# Patient Record
Sex: Male | Born: 1955 | Race: White | Hispanic: No | Smoking: Current every day smoker
Health system: Southern US, Community
[De-identification: ages and names within clinical notes are randomized; demographics above are authoritative.]

## PROBLEM LIST (undated history)

## (undated) DIAGNOSIS — R0989 Other specified symptoms and signs involving the circulatory and respiratory systems: Secondary | ICD-10-CM

## (undated) DIAGNOSIS — R197 Diarrhea, unspecified: Secondary | ICD-10-CM

## (undated) DIAGNOSIS — R5381 Other malaise: Secondary | ICD-10-CM

## (undated) DIAGNOSIS — J449 Chronic obstructive pulmonary disease, unspecified: Secondary | ICD-10-CM

## (undated) DIAGNOSIS — K5900 Constipation, unspecified: Secondary | ICD-10-CM

## (undated) DIAGNOSIS — F3289 Other specified depressive episodes: Secondary | ICD-10-CM

## (undated) DIAGNOSIS — R51 Headache: Secondary | ICD-10-CM

## (undated) DIAGNOSIS — R5383 Other fatigue: Secondary | ICD-10-CM

## (undated) DIAGNOSIS — F411 Generalized anxiety disorder: Secondary | ICD-10-CM

## (undated) DIAGNOSIS — R0609 Other forms of dyspnea: Secondary | ICD-10-CM

## (undated) DIAGNOSIS — F329 Major depressive disorder, single episode, unspecified: Secondary | ICD-10-CM

## (undated) DIAGNOSIS — J4489 Other specified chronic obstructive pulmonary disease: Secondary | ICD-10-CM

## (undated) DIAGNOSIS — M545 Low back pain, unspecified: Secondary | ICD-10-CM

## (undated) DIAGNOSIS — I214 Non-ST elevation (NSTEMI) myocardial infarction: Secondary | ICD-10-CM

## (undated) DIAGNOSIS — J9610 Chronic respiratory failure, unspecified whether with hypoxia or hypercapnia: Secondary | ICD-10-CM

## (undated) DIAGNOSIS — R059 Cough, unspecified: Secondary | ICD-10-CM

## (undated) DIAGNOSIS — R634 Abnormal weight loss: Secondary | ICD-10-CM

## (undated) DIAGNOSIS — R05 Cough: Secondary | ICD-10-CM

## (undated) DIAGNOSIS — J189 Pneumonia, unspecified organism: Secondary | ICD-10-CM

## (undated) DIAGNOSIS — E079 Disorder of thyroid, unspecified: Secondary | ICD-10-CM

## (undated) DIAGNOSIS — J9383 Other pneumothorax: Secondary | ICD-10-CM

## (undated) HISTORY — DX: Abnormal weight loss: R63.4

## (undated) HISTORY — DX: Other specified symptoms and signs involving the circulatory and respiratory systems: R06.09

## (undated) HISTORY — DX: Generalized anxiety disorder: F41.1

## (undated) HISTORY — DX: Other specified symptoms and signs involving the circulatory and respiratory systems: R09.89

## (undated) HISTORY — DX: Diarrhea, unspecified: R19.7

## (undated) HISTORY — DX: Other fatigue: R53.83

## (undated) HISTORY — PX: CHEST TUBE INSERTION: SHX231

## (undated) HISTORY — DX: Constipation, unspecified: K59.00

## (undated) HISTORY — DX: Cough, unspecified: R05.9

## (undated) HISTORY — DX: Chronic obstructive pulmonary disease, unspecified: J44.9

## (undated) HISTORY — DX: Low back pain: M54.5

## (undated) HISTORY — DX: Cough: R05

## (undated) HISTORY — DX: Other specified chronic obstructive pulmonary disease: J44.89

## (undated) HISTORY — DX: Major depressive disorder, single episode, unspecified: F32.9

## (undated) HISTORY — DX: Other forms of dyspnea: R06.09

## (undated) HISTORY — DX: Other malaise: R53.81

## (undated) HISTORY — DX: Low back pain, unspecified: M54.50

## (undated) HISTORY — DX: Other specified depressive episodes: F32.89

## (undated) HISTORY — DX: Headache: R51

---

## 1985-05-09 HISTORY — PX: SPLENECTOMY: SUR1306

## 1998-09-17 ENCOUNTER — Emergency Department (HOSPITAL_COMMUNITY): Admission: EM | Admit: 1998-09-17 | Discharge: 1998-09-17 | Payer: Self-pay | Admitting: Emergency Medicine

## 1998-09-17 ENCOUNTER — Encounter: Payer: Self-pay | Admitting: Emergency Medicine

## 2002-12-10 ENCOUNTER — Emergency Department (HOSPITAL_COMMUNITY): Admission: EM | Admit: 2002-12-10 | Discharge: 2002-12-10 | Payer: Self-pay | Admitting: Emergency Medicine

## 2003-03-30 ENCOUNTER — Emergency Department (HOSPITAL_COMMUNITY): Admission: EM | Admit: 2003-03-30 | Discharge: 2003-03-30 | Payer: Self-pay | Admitting: Emergency Medicine

## 2004-03-01 ENCOUNTER — Emergency Department (HOSPITAL_COMMUNITY): Admission: EM | Admit: 2004-03-01 | Discharge: 2004-03-01 | Payer: Self-pay | Admitting: Emergency Medicine

## 2009-03-17 ENCOUNTER — Emergency Department (HOSPITAL_COMMUNITY): Admission: EM | Admit: 2009-03-17 | Discharge: 2009-03-17 | Payer: Self-pay | Admitting: Emergency Medicine

## 2009-11-19 ENCOUNTER — Emergency Department (HOSPITAL_COMMUNITY): Admission: EM | Admit: 2009-11-19 | Discharge: 2009-11-19 | Payer: Self-pay | Admitting: Emergency Medicine

## 2009-12-01 ENCOUNTER — Ambulatory Visit: Payer: Self-pay | Admitting: Family Medicine

## 2009-12-08 ENCOUNTER — Emergency Department (HOSPITAL_COMMUNITY): Admission: EM | Admit: 2009-12-08 | Discharge: 2009-12-08 | Payer: Self-pay | Admitting: Emergency Medicine

## 2010-05-09 HISTORY — PX: OTHER SURGICAL HISTORY: SHX169

## 2010-06-14 ENCOUNTER — Emergency Department (HOSPITAL_COMMUNITY): Payer: Medicaid Other

## 2010-06-14 ENCOUNTER — Emergency Department (HOSPITAL_COMMUNITY)
Admission: EM | Admit: 2010-06-14 | Discharge: 2010-06-14 | Disposition: A | Discharge status: Home / Self Care | Payer: Medicaid Other | Attending: Emergency Medicine | Admitting: Emergency Medicine

## 2010-06-14 DIAGNOSIS — J449 Chronic obstructive pulmonary disease, unspecified: Secondary | ICD-10-CM | POA: Insufficient documentation

## 2010-06-14 DIAGNOSIS — R0602 Shortness of breath: Secondary | ICD-10-CM | POA: Insufficient documentation

## 2010-06-14 DIAGNOSIS — J4489 Other specified chronic obstructive pulmonary disease: Secondary | ICD-10-CM | POA: Insufficient documentation

## 2010-08-11 LAB — BASIC METABOLIC PANEL
CO2: 30 mEq/L (ref 19–32)
Calcium: 9 mg/dL (ref 8.4–10.5)
Creatinine, Ser: 0.99 mg/dL (ref 0.4–1.5)
GFR calc Af Amer: >60 mL/min (ref >60)
GFR calc non Af Amer: >60 mL/min (ref >60)
Sodium: 138 mEq/L (ref 135–145)

## 2010-08-11 LAB — DIFFERENTIAL
Lymphocytes Relative: 32 % (ref 12–46)
Lymphs Abs: 3.9 10*3/uL (ref 0.7–4.0)
Monocytes Absolute: 1.9 10*3/uL — ABNORMAL HIGH (ref 0.1–1.0)
Monocytes Relative: 16 % — ABNORMAL HIGH (ref 3–12)
Neutro Abs: 5.7 10*3/uL (ref 1.7–7.7)
Neutrophils Relative %: 47 % (ref 43–77)

## 2010-08-11 LAB — POCT CARDIAC MARKERS
CKMB, poc: <1 ng/mL — ABNORMAL LOW (ref 1.0–8.0)
Myoglobin, poc: 51.2 ng/mL (ref 12–200)
Troponin i, poc: <0.05 ng/mL (ref 0.00–0.09)

## 2010-08-11 LAB — CBC
Hemoglobin: 15.9 g/dL (ref 13.0–17.0)
RBC: 5.23 MIL/uL (ref 4.22–5.81)
WBC: 12.2 10*3/uL — ABNORMAL HIGH (ref 4.0–10.5)

## 2011-01-05 ENCOUNTER — Observation Stay (HOSPITAL_COMMUNITY)
Admission: EM | Admit: 2011-01-05 | Discharge: 2011-01-06 | Disposition: A | Discharge status: Home / Self Care | Payer: Medicare Other | Attending: Internal Medicine | Admitting: Internal Medicine

## 2011-01-05 ENCOUNTER — Emergency Department (HOSPITAL_COMMUNITY): Payer: Medicare Other

## 2011-01-05 ENCOUNTER — Encounter: Payer: Self-pay | Admitting: Internal Medicine

## 2011-01-05 DIAGNOSIS — R0602 Shortness of breath: Secondary | ICD-10-CM | POA: Insufficient documentation

## 2011-01-05 DIAGNOSIS — Z8571 Personal history of Hodgkin lymphoma: Secondary | ICD-10-CM | POA: Insufficient documentation

## 2011-01-05 DIAGNOSIS — F17200 Nicotine dependence, unspecified, uncomplicated: Secondary | ICD-10-CM | POA: Insufficient documentation

## 2011-01-05 DIAGNOSIS — I951 Orthostatic hypotension: Secondary | ICD-10-CM | POA: Insufficient documentation

## 2011-01-05 DIAGNOSIS — R072 Precordial pain: Secondary | ICD-10-CM

## 2011-01-05 DIAGNOSIS — Z23 Encounter for immunization: Secondary | ICD-10-CM | POA: Insufficient documentation

## 2011-01-05 DIAGNOSIS — J439 Emphysema, unspecified: Principal | ICD-10-CM | POA: Insufficient documentation

## 2011-01-05 DIAGNOSIS — R079 Chest pain, unspecified: Secondary | ICD-10-CM | POA: Insufficient documentation

## 2011-01-05 LAB — CBC
HCT: 46 % (ref 39.0–52.0)
MCHC: 35.4 g/dL (ref 30.0–36.0)
MCV: 88.5 fL (ref 78.0–100.0)
Platelets: 276 10*3/uL (ref 150–400)
RDW: 15.6 % — ABNORMAL HIGH (ref 11.5–15.5)
WBC: 9.4 10*3/uL (ref 4.0–10.5)

## 2011-01-05 LAB — URINALYSIS, ROUTINE W REFLEX MICROSCOPIC
Bilirubin Urine: NEGATIVE
Hgb urine dipstick: NEGATIVE
Ketones, ur: NEGATIVE mg/dL
Specific Gravity, Urine: 1.011 (ref 1.005–1.030)
pH: 6.5 (ref 5.0–8.0)

## 2011-01-05 LAB — DIFFERENTIAL
Basophils Absolute: 0.1 10*3/uL (ref 0.0–0.1)
Basophils Relative: 1 % (ref 0–1)
Eosinophils Absolute: 0.7 10*3/uL (ref 0.0–0.7)
Lymphocytes Relative: 38 % (ref 12–46)
Monocytes Absolute: 1.2 10*3/uL — ABNORMAL HIGH (ref 0.1–1.0)
Neutro Abs: 3.8 10*3/uL (ref 1.7–7.7)
Neutrophils Relative %: 41 % — ABNORMAL LOW (ref 43–77)

## 2011-01-05 LAB — POCT I-STAT, CHEM 8
Chloride: 100 mEq/L (ref 96–112)
Glucose, Bld: 94 mg/dL (ref 70–99)
HCT: 52 % (ref 39.0–52.0)
Hemoglobin: 17.7 g/dL — ABNORMAL HIGH (ref 13.0–17.0)
Potassium: 4.1 mEq/L (ref 3.5–5.1)

## 2011-01-05 LAB — CARDIAC PANEL(CRET KIN+CKTOT+MB+TROPI)
CK, MB: 2.6 ng/mL (ref 0.3–4.0)
Relative Index: INVALID (ref 0.0–2.5)
Troponin I: <0.3 ng/mL (ref <0.30)

## 2011-01-05 LAB — TSH: TSH: 1.233 u[IU]/mL (ref 0.350–4.500)

## 2011-01-05 LAB — POCT I-STAT TROPONIN I: Troponin i, poc: 0.01 ng/mL (ref 0.00–0.08)
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
## 2011-01-06 DIAGNOSIS — R079 Chest pain, unspecified: Secondary | ICD-10-CM

## 2011-01-06 DIAGNOSIS — R55 Syncope and collapse: Secondary | ICD-10-CM

## 2011-01-06 LAB — CBC
HCT: 44 % (ref 39.0–52.0)
MCH: 30.1 pg (ref 26.0–34.0)
MCHC: 34.1 g/dL (ref 30.0–36.0)
MCV: 88.4 fL (ref 78.0–100.0)
RDW: 15.4 % (ref 11.5–15.5)
WBC: 10.9 10*3/uL — ABNORMAL HIGH (ref 4.0–10.5)

## 2011-01-06 LAB — CARDIAC PANEL(CRET KIN+CKTOT+MB+TROPI)
CK, MB: 2.2 ng/mL (ref 0.3–4.0)
Total CK: 80 U/L (ref 7–232)
Troponin I: <0.3 ng/mL (ref <0.30)

## 2011-01-06 LAB — LIPID PANEL
HDL: 68 mg/dL (ref >39)
Total CHOL/HDL Ratio: 2.7 RATIO
VLDL: 25 mg/dL (ref 0–40)

## 2011-01-06 LAB — BASIC METABOLIC PANEL
BUN: 10 mg/dL (ref 6–23)
CO2: 27 mEq/L (ref 19–32)
Chloride: 105 mEq/L (ref 96–112)
GFR calc Af Amer: >60 mL/min (ref >60)
Potassium: 3.7 mEq/L (ref 3.5–5.1)
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
## 2011-04-18 ENCOUNTER — Emergency Department (HOSPITAL_COMMUNITY)
Admission: EM | Admit: 2011-04-18 | Discharge: 2011-04-18 | Discharge status: Against Medical Advice | Payer: Medicare Other

## 2011-04-18 NOTE — ED Notes (Signed)
Pt called in all waiting areas, unable to locate pt 

## 2011-07-31 ENCOUNTER — Emergency Department (HOSPITAL_COMMUNITY)
Admission: EM | Admit: 2011-07-31 | Discharge: 2011-07-31 | Disposition: A | Discharge status: Home / Self Care | Payer: Medicare Other | Attending: Emergency Medicine | Admitting: Emergency Medicine

## 2011-07-31 ENCOUNTER — Emergency Department (HOSPITAL_COMMUNITY): Payer: Medicare Other

## 2011-07-31 ENCOUNTER — Encounter (HOSPITAL_COMMUNITY): Payer: Self-pay | Admitting: General Practice

## 2011-07-31 DIAGNOSIS — E079 Disorder of thyroid, unspecified: Secondary | ICD-10-CM | POA: Insufficient documentation

## 2011-07-31 DIAGNOSIS — J4489 Other specified chronic obstructive pulmonary disease: Secondary | ICD-10-CM | POA: Insufficient documentation

## 2011-07-31 DIAGNOSIS — R233 Spontaneous ecchymoses: Secondary | ICD-10-CM | POA: Insufficient documentation

## 2011-07-31 DIAGNOSIS — F17200 Nicotine dependence, unspecified, uncomplicated: Secondary | ICD-10-CM | POA: Insufficient documentation

## 2011-07-31 DIAGNOSIS — M25473 Effusion, unspecified ankle: Secondary | ICD-10-CM | POA: Insufficient documentation

## 2011-07-31 DIAGNOSIS — M25579 Pain in unspecified ankle and joints of unspecified foot: Secondary | ICD-10-CM | POA: Insufficient documentation

## 2011-07-31 DIAGNOSIS — J449 Chronic obstructive pulmonary disease, unspecified: Secondary | ICD-10-CM | POA: Insufficient documentation

## 2011-07-31 DIAGNOSIS — M25476 Effusion, unspecified foot: Secondary | ICD-10-CM | POA: Insufficient documentation

## 2011-07-31 HISTORY — DX: Disorder of thyroid, unspecified: E07.9

## 2011-07-31 HISTORY — DX: Chronic obstructive pulmonary disease, unspecified: J44.9

## 2011-07-31 LAB — COMPREHENSIVE METABOLIC PANEL
ALT: 35 U/L (ref 0–53)
AST: 46 U/L — ABNORMAL HIGH (ref 0–37)
Albumin: 3.4 g/dL — ABNORMAL LOW (ref 3.5–5.2)
Alkaline Phosphatase: 113 U/L (ref 39–117)
BUN: 8 mg/dL (ref 6–23)
CO2: 27 mEq/L (ref 19–32)
Calcium: 9.3 mg/dL (ref 8.4–10.5)
Chloride: 100 mEq/L (ref 96–112)
Creatinine, Ser: 0.75 mg/dL (ref 0.50–1.35)
GFR calc Af Amer: >90 mL/min (ref >90)
GFR calc non Af Amer: >90 mL/min (ref >90)
Glucose, Bld: 83 mg/dL (ref 70–99)
Potassium: 4.1 mEq/L (ref 3.5–5.1)
Sodium: 138 mEq/L (ref 135–145)
Total Bilirubin: 0.3 mg/dL (ref 0.3–1.2)
Total Protein: 8.1 g/dL (ref 6.0–8.3)

## 2011-07-31 LAB — CBC
HCT: 45.7 % (ref 39.0–52.0)
Hemoglobin: 15.6 g/dL (ref 13.0–17.0)
MCH: 30.3 pg (ref 26.0–34.0)
MCHC: 34.1 g/dL (ref 30.0–36.0)
MCV: 88.7 fL (ref 78.0–100.0)
Platelets: 432 10*3/uL — ABNORMAL HIGH (ref 150–400)
RBC: 5.15 MIL/uL (ref 4.22–5.81)
RDW: 14.5 % (ref 11.5–15.5)
WBC: 9.6 10*3/uL (ref 4.0–10.5)

## 2011-07-31 LAB — DIFFERENTIAL
Basophils Absolute: 0.1 10*3/uL (ref 0.0–0.1)
Basophils Relative: 2 % — ABNORMAL HIGH (ref 0–1)
Eosinophils Absolute: 0.4 10*3/uL (ref 0.0–0.7)
Eosinophils Relative: 4 % (ref 0–5)
Lymphocytes Relative: 36 % (ref 12–46)
Lymphs Abs: 3.5 10*3/uL (ref 0.7–4.0)
Monocytes Absolute: 0.8 10*3/uL (ref 0.1–1.0)
Monocytes Relative: 8 % (ref 3–12)
Neutro Abs: 4.9 10*3/uL (ref 1.7–7.7)
Neutrophils Relative %: 50 % (ref 43–77)

## 2011-07-31 MED ORDER — HYDROCODONE-ACETAMINOPHEN 5-325 MG PO TABS: 1.0000 | ORAL_TABLET | ORAL | Status: AC | PRN

## 2011-07-31 NOTE — ED Notes (Signed)
Patient complaining of red, itchy rash on his ankle that is spreading around his foot and up his leg.  Patient states that he has had episodes of this rash before, but that it has cleared up on its own.  Patient rates pain 2/10 on the numerical pain scale; describes pain as a "soreness"; most pain located in medial aspect of inner right ankle.  Upon assessment, red splotchy rash noted on right ankle/foot.  Patient alert and oriented x4; PERRL present. Will continue to monitor.

## 2011-07-31 NOTE — ED Notes (Signed)
Pt states understanding of discharge instructions 

## 2011-07-31 NOTE — Discharge Instructions (Signed)
Please read over the instructions below. You was seen in the emergency department tonight for the rash on your bilateral lower extremities. This rash is called a petechial rash and can sometimes signal bleeding problems. However your labs tonight are normal. We have wrapped the swollen ankle in an Ace bandage. You can wear this until your discomfort improves. We have also prescribed a short course of medication for pain. Please take as directed. It is important that you get established with a primary care physician in this area for followup and for ongoing primary health care needs. We have provided the "[facility]" number above, the resource list below and the attached referral to assist you in getting established with a primary care physician. Return if symptoms worsen otherwise follow up as discussed.

## 2011-07-31 NOTE — ED Notes (Signed)
Pt with intermittent petechial rash over the last year - states it waxes and wanes and will completely go away at times.  He has been having increased swelling in the R ankle over the last 24 hours - has had rash present for several days - rash has some ttp.  Denies fevers, vomiting, ha, blurred vision, recent diarrheal illness and no recent antibiotic use.  PE:  Has soft abd, normal heart and lung sounds, no edema, mild swelling of the RL ankle, bilateral petechial rash to the LE's below the knee.  No rash in mouth, trunk, upper extremities.  Rash is non pruritic, non blanching and not raised.  Mild pain with ROM of the R ankle.  VS without fever or tachycardia.  Filed Vitals:   07/31/11 0052  BP: 110/63  Pulse: 80  Temp: 97.6 F (36.4 C)  Resp: 20   Pt has labs as below:  Results for orders placed during the hospital encounter of 07/31/11  CBC      Component Value Range   WBC 9.6  4.0 - 10.5 (K/uL)   RBC 5.15  4.22 - 5.81 (MIL/uL)   Hemoglobin 15.6  13.0 - 17.0 (g/dL)   HCT 16.1  09.6 - 04.5 (%)   MCV 88.7  78.0 - 100.0 (fL)   MCH 30.3  26.0 - 34.0 (pg)   MCHC 34.1  30.0 - 36.0 (g/dL)   RDW 40.9  81.1 - 91.4 (%)   Platelets 432 (*) 150 - 400 (K/uL)  DIFFERENTIAL      Component Value Range   Neutrophils Relative 50  43 - 77 (%)   Neutro Abs 4.9  1.7 - 7.7 (K/uL)   Lymphocytes Relative 36  12 - 46 (%)   Lymphs Abs 3.5  0.7 - 4.0 (K/uL)   Monocytes Relative 8  3 - 12 (%)   Monocytes Absolute 0.8  0.1 - 1.0 (K/uL)   Eosinophils Relative 4  0 - 5 (%)   Eosinophils Absolute 0.4  0.0 - 0.7 (K/uL)   Basophils Relative 2 (*) 0 - 1 (%)   Basophils Absolute 0.1  0.0 - 0.1 (K/uL)  COMPREHENSIVE METABOLIC PANEL      Component Value Range   Sodium 138  135 - 145 (mEq/L)   Potassium 4.1  3.5 - 5.1 (mEq/L)   Chloride 100  96 - 112 (mEq/L)   CO2 27  19 - 32 (mEq/L)   Glucose, Bld 83  70 - 99 (mg/dL)   BUN 8  6 - 23 (mg/dL)   Creatinine, Ser 7.82  0.50 - 1.35 (mg/dL)   Calcium 9.3  8.4  - 10.5 (mg/dL)   Total Protein 8.1  6.0 - 8.3 (g/dL)   Albumin 3.4 (*) 3.5 - 5.2 (g/dL)   AST 46 (*) 0 - 37 (U/L)   ALT 35  0 - 53 (U/L)   Alkaline Phosphatase 113  39 - 117 (U/L)   Total Bilirubin 0.3  0.3 - 1.2 (mg/dL)   GFR calc non Af Amer >90  >90 (mL/min)   GFR calc Af Amer >90  >90 (mL/min)   Dg Ankle Complete Right  07/31/2011  *RADIOLOGY REPORT*  Clinical Data: Rash  RIGHT ANKLE - COMPLETE 3+ VIEW  Comparison: None  Findings: Soft tissue swelling greatest medially and posteriorly. Ankle mortise intact. Osseous mineralization grossly normal for technique. No acute fracture, dislocation, or bone destruction.  IMPRESSION: No acute bony abnormalities.  Original Report Authenticated By: Lollie Marrow, M.D.  Normal Cr, normal platelets and blood counts. Referral given, does not appear to be related to a thrombocytopenia and pt does not appear septic, or ill appearing in any other way.  Pt has expressed understanding for need for follow up.  Medical screening examination/treatment/procedure(s) were conducted as a shared visit with non-physician practitioner(s) and myself.  I personally evaluated the patient during the encounter    Vida Roller, MD 07/31/11 [phone]

## 2011-07-31 NOTE — ED Provider Notes (Signed)
History     CSN: [id number]  Arrival date & time 07/31/11  4098   First MD Initiated Contact with Patient 07/31/11 [phone]      Chief Complaint  Patient presents with  . Rash    HPI: Patient is a 56 y.o. male presenting with rash. The history is provided by the patient.  Rash  This is a recurrent problem. The current episode started more than 1 week ago. The problem has been gradually worsening. The problem is associated with nothing. There has been no fever. The rash is present on the left lower leg and right lower leg. The pain is at a severity of 2/10. The pain is mild. The pain has been constant since onset. Pertinent negatives include no blisters, no itching and no weeping. He has tried nothing for the symptoms.  Pt reports chronic rash to BLE's x > 1 year. Pt denies rash associated w/ anything known. Rash began worsened approx 1 week ago. Presents tonight because he noted somewhat painful swelling and redness to (R) ankle which had never happened before. Denies injury.   Past Medical History  Diagnosis Date  . Thyroid disease   . COPD (chronic obstructive pulmonary disease)     Past Surgical History  Procedure Date  . Splenectomy     No family history on file.  History  Substance Use Topics  . Smoking status: Current Everyday Smoker -- 0.2 packs/day  . Smokeless tobacco: Not on file  . Alcohol Use: 3.6 oz/week    6 Cans of beer per week      Review of Systems  Constitutional: Negative.  Negative for fever.  HENT: Negative.  Negative for nosebleeds.   Eyes: Negative.   Respiratory: Negative.  Negative for shortness of breath.   Cardiovascular: Negative.  Negative for leg swelling.  Gastrointestinal: Negative.  Negative for blood in stool and anal bleeding.  Genitourinary: Negative.  Negative for hematuria.  Musculoskeletal: Negative.   Skin: Positive for rash. Negative for itching.  Neurological: Negative.  Negative for weakness.  Hematological: Negative.  Does  not bruise/bleed easily.  Psychiatric/Behavioral: Negative.     Allergies  Codeine  Home Medications   Current Outpatient Rx  Name Route Sig Dispense Refill  . ACETAMINOPHEN 325 MG PO TABS Oral Take 650 mg by mouth every 6 (six) hours as needed. Fever or pain    . ALBUTEROL SULFATE HFA 108 (90 BASE) MCG/ACT IN AERS Inhalation Inhale 2 puffs into the lungs every 6 (six) hours as needed. wheezing    . BUDESONIDE-FORMOTEROL FUMARATE 160-4.5 MCG/ACT IN AERO Inhalation Inhale 2 puffs into the lungs 2 (two) times daily.      BP 110/63  Pulse 80  Temp(Src) 97.6 F (36.4 C) (Oral)  Resp 20  SpO2 90%  Physical Exam  Constitutional: He appears well-developed and well-nourished.  HENT:  Head: Normocephalic and atraumatic.  Eyes: Conjunctivae are normal.  Neck: Neck supple.  Cardiovascular: Normal rate and regular rhythm.   Pulmonary/Chest: Effort normal and breath sounds normal.  Abdominal: Soft. Bowel sounds are normal.  Musculoskeletal: Normal range of motion.       Feet:       Swollen, ecchymotic area over entire(R)  medial malleolus. Area is TTP but pt has full ROM. Is able to flex and extend foot w/o difficulty. Bil dp's palpable  Neurological: He is alert.  Skin: Skin is warm and dry. Ecchymosis, petechiae and rash noted.       Petechial rash to BLE. Sparsely  distributed from lower thighs to mid-calf. More densely distributed from mid-calf to feet. Ecchymotic, swollen  area over medial (R) medial malleolus, otherwise no other significant, swelling or weeping.  Psychiatric: He has a normal mood and affect.    ED Course  Procedures   Findings and clinical impression discussed w/ pt. Will apple ace wrap to (R) ankle and plan for d/c home w/ Rx for short course of medication for pain and instructions to arrange f/u w/ PCP. Pt agreeable w/ plan. I have discussed pt w/ Dr Hyacinth Meeker who has also seen and examined pt and is agreeable w/ plan.  Labs Reviewed  CBC - Abnormal; Notable  for the following:    Platelets 432 (*)    All other components within normal limits  DIFFERENTIAL - Abnormal; Notable for the following:    Basophils Relative 2 (*)    All other components within normal limits  COMPREHENSIVE METABOLIC PANEL - Abnormal; Notable for the following:    Albumin 3.4 (*)    AST 46 (*)    All other components within normal limits   Dg Ankle Complete Right  07/31/2011  *RADIOLOGY REPORT*  Clinical Data: Rash  RIGHT ANKLE - COMPLETE 3+ VIEW  Comparison: None  Findings: Soft tissue swelling greatest medially and posteriorly. Ankle mortise intact. Osseous mineralization grossly normal for technique. No acute fracture, dislocation, or bone destruction.  IMPRESSION: No acute bony abnormalities.  Original Report Authenticated By: Lollie Marrow, M.D.     No diagnosis found.    MDM  HPI/PE and clinical findings c/w 1. Petechial rash to BLE's (pt w/o other c/o's such as weakness, h/a, fever, or other bleeding associated symptoms, plt ct 424, H/H 15.7 and 45.6 respectively 2. Swelling/ecchymosis (R) ankle (no injury, imaging w/o acute findings, full ROM, minimal pain, pulses intact)  Source of symptoms remains unclear,  HPI, labs and assessment suggest  no acute process. Pt to arrange f/u w/ PCP.        Leanne Chang, NP 08/01/11 1908

## 2011-07-31 NOTE — ED Notes (Signed)
Patient currently resting quietly in bed; no respiratory or acute distress.  Patient updated on plan of care; informed patient that FNP is currently over in pediatrics and will be back shortly.  Patient has no other questions or concerns at this time; will continue to monitor.  Patient moved from recliner in STRETCHER 4 to bed in STRETCHER 6 to make patient more comfortable.

## 2011-07-31 NOTE — ED Notes (Signed)
Pt to ED c/o medial R leg rash and tenderness to medial side of R ankle.

## 2011-08-02 NOTE — ED Provider Notes (Signed)
Medical screening examination/treatment/procedure(s) were conducted as a shared visit with non-physician practitioner(s) and myself.  I personally evaluated the patient during the encounter  Please see my separate respective documentation pertaining to this patient encounter   Vida Roller, MD 08/02/11 [phone]

## 2011-09-16 ENCOUNTER — Encounter (HOSPITAL_COMMUNITY): Payer: Self-pay

## 2011-09-16 ENCOUNTER — Emergency Department (HOSPITAL_COMMUNITY): Payer: Medicare Other

## 2011-09-16 ENCOUNTER — Inpatient Hospital Stay (HOSPITAL_COMMUNITY): Payer: Medicare Other

## 2011-09-16 ENCOUNTER — Inpatient Hospital Stay (HOSPITAL_COMMUNITY)
Admission: EM | Admit: 2011-09-16 | Discharge: 2011-09-26 | DRG: 164 | Disposition: A | Discharge status: Home / Self Care | Payer: Medicare Other | Attending: Pulmonary Disease | Admitting: Pulmonary Disease

## 2011-09-16 DIAGNOSIS — J4489 Other specified chronic obstructive pulmonary disease: Secondary | ICD-10-CM | POA: Diagnosis present

## 2011-09-16 DIAGNOSIS — J9383 Other pneumothorax: Principal | ICD-10-CM

## 2011-09-16 DIAGNOSIS — J939 Pneumothorax, unspecified: Secondary | ICD-10-CM

## 2011-09-16 DIAGNOSIS — J449 Chronic obstructive pulmonary disease, unspecified: Secondary | ICD-10-CM

## 2011-09-16 DIAGNOSIS — Z8571 Personal history of Hodgkin lymphoma: Secondary | ICD-10-CM

## 2011-09-16 DIAGNOSIS — J930 Spontaneous tension pneumothorax: Secondary | ICD-10-CM

## 2011-09-16 DIAGNOSIS — J9600 Acute respiratory failure, unspecified whether with hypoxia or hypercapnia: Secondary | ICD-10-CM

## 2011-09-16 DIAGNOSIS — Z79899 Other long term (current) drug therapy: Secondary | ICD-10-CM

## 2011-09-16 DIAGNOSIS — Z888 Allergy status to other drugs, medicaments and biological substances status: Secondary | ICD-10-CM

## 2011-09-16 DIAGNOSIS — F1010 Alcohol abuse, uncomplicated: Secondary | ICD-10-CM | POA: Diagnosis present

## 2011-09-16 DIAGNOSIS — F17200 Nicotine dependence, unspecified, uncomplicated: Secondary | ICD-10-CM | POA: Diagnosis present

## 2011-09-16 DIAGNOSIS — J9690 Respiratory failure, unspecified, unspecified whether with hypoxia or hypercapnia: Secondary | ICD-10-CM

## 2011-09-16 DIAGNOSIS — Z23 Encounter for immunization: Secondary | ICD-10-CM

## 2011-09-16 DIAGNOSIS — E871 Hypo-osmolality and hyponatremia: Secondary | ICD-10-CM | POA: Diagnosis present

## 2011-09-16 DIAGNOSIS — J439 Emphysema, unspecified: Secondary | ICD-10-CM | POA: Diagnosis present

## 2011-09-16 HISTORY — DX: Other pneumothorax: J93.83

## 2011-09-16 LAB — BASIC METABOLIC PANEL
BUN: 7 mg/dL (ref 6–23)
CO2: 24 mEq/L (ref 19–32)
Chloride: 101 mEq/L (ref 96–112)
Creatinine, Ser: 0.73 mg/dL (ref 0.50–1.35)
Glucose, Bld: 136 mg/dL — ABNORMAL HIGH (ref 70–99)

## 2011-09-16 LAB — POCT I-STAT 3, ART BLOOD GAS (G3+)
Acid-base deficit: 3 mmol/L — ABNORMAL HIGH (ref 0.0–2.0)
O2 Saturation: 99 %
Patient temperature: 98.6

## 2011-09-16 LAB — DIFFERENTIAL
Basophils Relative: 1 % (ref 0–1)
Eosinophils Absolute: 0.7 10*3/uL (ref 0.0–0.7)
Monocytes Absolute: 1.2 10*3/uL — ABNORMAL HIGH (ref 0.1–1.0)
Neutro Abs: 8.2 10*3/uL — ABNORMAL HIGH (ref 1.7–7.7)
Neutrophils Relative %: 50 % (ref 43–77)

## 2011-09-16 LAB — CBC
Hemoglobin: 17.5 g/dL — ABNORMAL HIGH (ref 13.0–17.0)
MCH: 29.8 pg (ref 26.0–34.0)
MCHC: 33.7 g/dL (ref 30.0–36.0)

## 2011-09-16 MED ORDER — METHYLPREDNISOLONE SODIUM SUCC 125 MG IJ SOLR
INTRAMUSCULAR | Status: AC
  Filled 2011-09-16: qty 2

## 2011-09-16 MED ORDER — ALBUTEROL SULFATE (5 MG/ML) 0.5% IN NEBU
2.5000 mg | INHALATION_SOLUTION | Freq: Four times a day (QID) | RESPIRATORY_TRACT | Status: DC
  Administered 2011-09-17 – 2011-09-21 (×19): 2.5 mg via RESPIRATORY_TRACT
  Filled 2011-09-16 (×20): qty 0.5

## 2011-09-16 MED ORDER — VITAMIN B-1 100 MG PO TABS
100.0000 mg | ORAL_TABLET | Freq: Every day | ORAL | Status: DC
  Administered 2011-09-17 – 2011-09-26 (×9): 100 mg via ORAL
  Filled 2011-09-16 (×10): qty 1

## 2011-09-16 MED ORDER — MORPHINE SULFATE 2 MG/ML IJ SOLN
INTRAMUSCULAR | Status: AC
  Filled 2011-09-16: qty 1

## 2011-09-16 MED ORDER — MORPHINE SULFATE 4 MG/ML IJ SOLN
3.0000 mg | INTRAMUSCULAR | Status: DC | PRN
  Administered 2011-09-17 – 2011-09-19 (×13): 3 mg via INTRAVENOUS
  Administered 2011-09-19: 4 mg via INTRAVENOUS
  Administered 2011-09-19 – 2011-09-22 (×15): 3 mg via INTRAVENOUS
  Filled 2011-09-16 (×31): qty 1

## 2011-09-16 MED ORDER — ALBUTEROL SULFATE (5 MG/ML) 0.5% IN NEBU
INHALATION_SOLUTION | RESPIRATORY_TRACT | Status: AC
  Filled 2011-09-16: qty 2

## 2011-09-16 MED ORDER — MAGNESIUM SULFATE 40 MG/ML IJ SOLN
2.0000 g | Freq: Once | INTRAMUSCULAR | Status: AC
  Administered 2011-09-16: 2 g via INTRAVENOUS
  Filled 2011-09-16 (×2): qty 50

## 2011-09-16 MED ORDER — ASPIRIN 81 MG PO CHEW
324.0000 mg | CHEWABLE_TABLET | ORAL | Status: AC
  Administered 2011-09-16: 324 mg via ORAL
  Filled 2011-09-16: qty 4

## 2011-09-16 MED ORDER — SODIUM CHLORIDE 0.9 % IV SOLN: 250.0000 mL | INTRAVENOUS | Status: DC | PRN

## 2011-09-16 MED ORDER — ALBUTEROL SULFATE (5 MG/ML) 0.5% IN NEBU: 2.5000 mg | INHALATION_SOLUTION | Freq: Once | RESPIRATORY_TRACT | Status: DC

## 2011-09-16 MED ORDER — MIDAZOLAM HCL 2 MG/2ML IJ SOLN
INTRAMUSCULAR | Status: AC
  Filled 2011-09-16: qty 4

## 2011-09-16 MED ORDER — LORAZEPAM 1 MG PO TABS
0.0000 mg | ORAL_TABLET | Freq: Four times a day (QID) | ORAL | Status: AC
  Administered 2011-09-16 – 2011-09-17 (×2): 1 mg via ORAL
  Administered 2011-09-17: 3 mg via ORAL
  Filled 2011-09-16: qty 3
  Filled 2011-09-16: qty 2
  Filled 2011-09-16: qty 1
  Filled 2011-09-16: qty 3

## 2011-09-16 MED ORDER — IPRATROPIUM BROMIDE 0.02 % IN SOLN
RESPIRATORY_TRACT | Status: AC
  Administered 2011-09-17: 0.5 mg via RESPIRATORY_TRACT
  Filled 2011-09-16: qty 2.5

## 2011-09-16 MED ORDER — IPRATROPIUM BROMIDE 0.02 % IN SOLN
0.5000 mg | Freq: Four times a day (QID) | RESPIRATORY_TRACT | Status: DC
  Administered 2011-09-17 – 2011-09-21 (×19): 0.5 mg via RESPIRATORY_TRACT
  Filled 2011-09-16 (×20): qty 2.5

## 2011-09-16 MED ORDER — MIDAZOLAM HCL 2 MG/2ML IJ SOLN
2.0000 mg | Freq: Once | INTRAMUSCULAR | Status: AC
  Administered 2011-09-16: 2 mg via INTRAVENOUS

## 2011-09-16 MED ORDER — LORAZEPAM 1 MG PO TABS
1.0000 mg | ORAL_TABLET | Freq: Four times a day (QID) | ORAL | Status: AC | PRN
  Administered 2011-09-16 – 2011-09-19 (×3): 1 mg via ORAL
  Filled 2011-09-16 (×3): qty 1

## 2011-09-16 MED ORDER — IPRATROPIUM BROMIDE 0.02 % IN SOLN: 0.5000 mg | Freq: Once | RESPIRATORY_TRACT | Status: DC

## 2011-09-16 MED ORDER — ALBUTEROL (5 MG/ML) CONTINUOUS INHALATION SOLN: 2.5000 mg/h | INHALATION_SOLUTION | RESPIRATORY_TRACT | Status: DC | PRN

## 2011-09-16 MED ORDER — BUDESONIDE-FORMOTEROL FUMARATE 160-4.5 MCG/ACT IN AERO
2.0000 | INHALATION_SPRAY | Freq: Two times a day (BID) | RESPIRATORY_TRACT | Status: DC
  Administered 2011-09-17 – 2011-09-26 (×18): 2 via RESPIRATORY_TRACT
  Filled 2011-09-16 (×2): qty 6

## 2011-09-16 MED ORDER — MOXIFLOXACIN HCL IN NACL 400 MG/250ML IV SOLN
400.0000 mg | Freq: Once | INTRAVENOUS | Status: AC
  Administered 2011-09-16: 400 mg via INTRAVENOUS
  Filled 2011-09-16: qty 250

## 2011-09-16 MED ORDER — ADULT MULTIVITAMIN W/MINERALS CH
1.0000 | ORAL_TABLET | Freq: Every day | ORAL | Status: DC
  Administered 2011-09-16 – 2011-09-21 (×6): 1 via ORAL
  Filled 2011-09-16 (×7): qty 1

## 2011-09-16 MED ORDER — MIDAZOLAM HCL 2 MG/2ML IJ SOLN
2.0000 mg | Freq: Once | INTRAMUSCULAR | Status: AC
  Administered 2011-09-16: 2 mg via INTRAVENOUS
  Filled 2011-09-16: qty 2

## 2011-09-16 MED ORDER — LORAZEPAM 1 MG PO TABS
0.0000 mg | ORAL_TABLET | Freq: Two times a day (BID) | ORAL | Status: DC
  Administered 2011-09-17: 2 mg via ORAL
  Administered 2011-09-18: 3 mg via ORAL
  Filled 2011-09-16: qty 1

## 2011-09-16 MED ORDER — FOLIC ACID 1 MG PO TABS
1.0000 mg | ORAL_TABLET | Freq: Every day | ORAL | Status: DC
  Administered 2011-09-17 – 2011-09-26 (×9): 1 mg via ORAL
  Filled 2011-09-16 (×10): qty 1

## 2011-09-16 MED ORDER — THIAMINE HCL 100 MG/ML IJ SOLN
100.0000 mg | Freq: Every day | INTRAMUSCULAR | Status: DC
  Filled 2011-09-16 (×10): qty 1

## 2011-09-16 MED ORDER — MORPHINE SULFATE 2 MG/ML IJ SOLN
2.0000 mg | Freq: Once | INTRAMUSCULAR | Status: AC
  Administered 2011-09-16: 2 mg via INTRAVENOUS
  Filled 2011-09-16: qty 1

## 2011-09-16 MED ORDER — ASPIRIN 300 MG RE SUPP: 300.0000 mg | RECTAL | Status: AC

## 2011-09-16 MED ORDER — PANTOPRAZOLE SODIUM 20 MG PO TBEC
20.0000 mg | DELAYED_RELEASE_TABLET | Freq: Every day | ORAL | Status: DC
  Administered 2011-09-16 – 2011-09-21 (×6): 20 mg via ORAL
  Filled 2011-09-16 (×7): qty 1

## 2011-09-16 MED ORDER — MORPHINE SULFATE 2 MG/ML IJ SOLN
2.0000 mg | INTRAMUSCULAR | Status: DC | PRN
  Administered 2011-09-16 (×3): 2 mg via INTRAVENOUS
  Filled 2011-09-16 (×2): qty 1

## 2011-09-16 MED ORDER — LORAZEPAM 2 MG/ML IJ SOLN: 1.0000 mg | Freq: Four times a day (QID) | INTRAMUSCULAR | Status: AC | PRN

## 2011-09-16 NOTE — Consult Note (Signed)
CARDIOTHORACIC SURGERY CONSULTATION REPORT  PCP is Franchot Mimes, MD, MD Referring Provider is Glynn Octave, MD   Reason for consultation:  Spontaneous pneumothorax   HPI:  The patient is a 56 yo male with known history of COPD, ongoing tobacco abuse, and previous history of spontaneous pneumothorax requiring chest tube placement on both the left and right sides in the past who presents with sudden onset of increased shortness of breath and pain across his shoulders since 1pm today.  CXR reveals large left pneumothorax.  Pulmonary Medicine and Cardiothoracic Surgery were both consulted.   Past Medical History  Diagnosis Date  . Thyroid disease   . COPD (chronic obstructive pulmonary disease)   . Hodgkin's disease   . Spontaneous pneumothorax     left  . Spontaneous pneumothorax     right    Past Surgical History  Procedure Date  . Splenectomy   . Chest tube insertion     left  . Chest tube insertion     right    History reviewed. No pertinent family history.  Social History History  Substance Use Topics  . Smoking status: Current Everyday Smoker -- 0.2 packs/day  . Smokeless tobacco: Not on file  . Alcohol Use: 3.6 oz/week    6 Cans of beer per week    Prior to Admission medications   Medication Sig Start Date End Date Taking? Authorizing Provider  albuterol (PROVENTIL HFA;VENTOLIN HFA) 108 (90 BASE) MCG/ACT inhaler Inhale 2 puffs into the lungs every 6 (six) hours as needed. wheezing   Yes Historical Provider, MD  budesonide-formoterol (SYMBICORT) 160-4.5 MCG/ACT inhaler Inhale 2 puffs into the lungs 2 (two) times daily.   Yes Historical Provider, MD    Current Facility-Administered Medications  Medication Dose Route Frequency Provider Last Rate Last Dose  . albuterol (PROVENTIL) (5 MG/ML) 0.5% nebulizer solution 2.5 mg  2.5 mg Nebulization Once Glynn Octave, MD      . albuterol (PROVENTIL) (5 MG/ML) 0.5% nebulizer solution           .  ipratropium (ATROVENT) 0.02 % nebulizer solution           . ipratropium (ATROVENT) nebulizer solution 0.5 mg  0.5 mg Nebulization Once Glynn Octave, MD      . magnesium sulfate IVPB 2 g 50 mL  2 g Intravenous Once Glynn Octave, MD      . methylPREDNISolone sodium succinate (SOLU-MEDROL) 125 mg/2 mL injection           . midazolam (VERSED) injection 2 mg  2 mg Intravenous Once Purcell Nails, MD   2 mg at 09/16/11 1543  . midazolam (VERSED) injection 2 mg  2 mg Intravenous Once Purcell Nails, MD   2 mg at 09/16/11 1555  . morphine 2 MG/ML injection 2 mg  2 mg Intravenous Once Purcell Nails, MD   2 mg at 09/16/11 1543  . moxifloxacin (AVELOX) IVPB 400 mg  400 mg Intravenous Once Glynn Octave, MD       Current Outpatient Prescriptions  Medication Sig Dispense Refill  . albuterol (PROVENTIL HFA;VENTOLIN HFA) 108 (90 BASE) MCG/ACT inhaler Inhale 2 puffs into the lungs every 6 (six) hours as needed. wheezing      . budesonide-formoterol (SYMBICORT) 160-4.5 MCG/ACT inhaler Inhale 2 puffs into the lungs 2 (two) times daily.        Allergies  Allergen Reactions  . Codeine Hives and Nausea And Vomiting  Review of Systems:  General:  normal appetite, normal energy   Respiratory:  + cough, no wheezing, no hemoptysis, + in with inspiration or cough, + chronic shortness of breath which is acutely worsened  Cardiac:   no chest pain or tightness, +exertional SOB, no resting SOB prior to today, no orthopnea, no LE edema, no palpitations, no syncope  GI:   no difficulty swallowing, no hematochezia, no hematemesis, no melena, no constipation, no diarrhea   GU:   no dysuria, no urgency, no frequency   Musculoskeletal: no arthritis, no arthralgia   Vascular:  no pain suggestive of claudication   Neuro:   no symptoms suggestive of TIA's, no seizures, no headaches, no peripheral neuropathy   Endocrine:  Negative   HEENT:  no loose teeth or painful teeth,  no recent vision  changes  Psych:   no anxiety, no depression    Physical Exam:   BP 139/88  Pulse 108  Temp(Src) 97.9 F (36.6 C) (Oral)  Resp 28  SpO2 97%  General:  Thin, chronically ill-appearing  HEENT:  Unremarkable   Neck:   no JVD, no bruits, no adenopathy   Chest:   Markedly diminished breath sounds on left side, no wheezes, no rhonchi   CV:   RRR, no murmur   Abdomen:  soft, non-tender, no masses   Extremities:  warm, well-perfused, pulses not palpable  Rectal/GU  Deferred  Neuro:   Grossly non-focal and symmetrical throughout  Skin:   Clean and dry, no rashes, no breakdown  Diagnostic Tests:   *RADIOLOGY REPORT*  Clinical Data: Shortness of breath, respiratory failure  PORTABLE CHEST - 1 VIEW  Comparison: Portable exam 1447 hours compared to 01/05/2011  Findings:  Normal heart size, mediastinal contours, and pulmonary vascularity.  Moderate to large left pneumothorax with partial atelectasis of the  left lower lobe.  No mediastinal shift.  Underlying emphysematous changes.  Infiltrate versus atelectasis in the right lower lobe.  Right apex scarring stable. Old left rib fractures.  IMPRESSION:  Moderate-to-large pneumothorax without mediastinal shift.  Partial atelectasis bilateral lower lobes.  Underlying COPD and right apex scarring.  Critical Value/emergent results were called by telephone at the  time of interpretation on 09/16/2011 at 1517 hours to Dr.  Manus Gunning, who verbally acknowledged these results.  Original Report Authenticated By: Lollie Marrow, M.D.   Impression:  Large recurrent left spontaneous pneumothorax in the setting of likely severe COPD and ongoing tobacco abuse.  The patient is acutely in distress and needs chest tube placement.    Plan:  Chest tube placement, then consider VATS for bleb resection depending upon progress.  COPD and other medical problems to be managed by Pulmonary Medicine team as requested by ED staff.    Salvatore Decent. [name],  MD 09/16/2011 4:11 PM

## 2011-09-16 NOTE — H&P (Signed)
Name: Philip Coleman MRN: [id number] DOB: 01/06/1956    LOS: 0  [place] [facility] Critical [facility] Note   History of Present Illness:  56 y/o WM with PMH of Hodgkin's Disease, Thyroid Disease, Severe COPD, h/o Recurrent spontaneous Pnuemothorax adm 5/10 with lt spon pnthx requiring chest tube. Transiently placed on bipap in ER. Chest tube placed by TCTS [name]) Followed by pulm at [facility] / Drains: Lt chest tube 5/10 >>   Tests / Events:    Past Medical History  Diagnosis Date  . Thyroid disease   . COPD (chronic obstructive pulmonary disease)   . Hodgkin's disease   . Spontaneous pneumothorax     left  . Spontaneous pneumothorax     right    Past Surgical History  Procedure Date  . Splenectomy   . Chest tube insertion     left  . Chest tube insertion     right    Prior to Admission medications   Medication Sig Start Date End Date Taking? Authorizing Provider  albuterol (PROVENTIL HFA;VENTOLIN HFA) 108 (90 BASE) MCG/ACT inhaler Inhale 2 puffs into the lungs every 6 (six) hours as needed. wheezing   Yes Historical Provider, MD  budesonide-formoterol (SYMBICORT) 160-4.5 MCG/ACT inhaler Inhale 2 puffs into the lungs 2 (two) times daily.   Yes Historical Provider, MD    Allergies Allergies  Allergen Reactions  . Codeine Hives and Nausea And Vomiting    Family History History reviewed. No pertinent family history.  Social History  reports that he has been smoking.  He does not have any smokeless tobacco history on file. He reports that he drinks about 3.6 ounces of alcohol per week. He reports that he does not use illicit drugs.  Review Of Systems:  Constitutional: negative for anorexia, fevers and sweats  Eyes: negative for irritation, redness and visual disturbance  Ears, nose, mouth, throat, and face: negative for earaches, epistaxis, nasal congestion and sore throat  Respiratory: negative for cough, dyspnea on exertion, sputum and wheezing , c/o lt  chest pain Cardiovascular: negative dyspnea, lower extremity edema, orthopnea, palpitations and syncope  Gastrointestinal: negative for abdominal pain, constipation, diarrhea, melena, nausea and vomiting  Genitourinary:negative for dysuria, frequency and hematuria  Hematologic/lymphatic: negative for bleeding, easy bruising and lymphadenopathy  Musculoskeletal:negative for arthralgias, muscle weakness and stiff joints  Neurological: negative for coordination problems, gait problems, headaches and weakness  Endocrine: negative for diabetic symptoms including polydipsia, polyuria and weight loss   Vital Signs: Temp:  [97.9 F (36.6 C)] 97.9 F (36.6 C) (05/10 1436) Pulse Rate:  [108-126] 108  (05/10 1541) Resp:  [17-28] 28  (05/10 1541) BP: (115-139)/(73-92) 139/88 mmHg (05/10 1541) SpO2:  [97 %-99 %] 97 % (05/10 1541)    Physical Examination: Gen. Pleasant, well-nourished, in no distress, normal affect ENT - no lesions, no post nasal drip Neck: No JVD, no thyromegaly, no carotid bruits Lungs: no use of accessory muscles, no dullness to percussion, decreased BL without rales or rhonchi , chest tube no air leak Cardiovascular: Rhythm regular, heart sounds  normal, no murmurs, no peripheral edema Abdomen: soft and non-tender, no hepatosplenomegaly, BS normal. Musculoskeletal: No deformities, no cyanosis or clubbing Neuro:  alert, non focal Skin:  Warm, no lesions/ rash    Labs    CBC  Lab 09/16/11 1425  HGB 17.5*  HCT 52.0  WBC 16.6*  PLT 315     BMET  Lab 09/16/11 1425  NA 136  K 4.6  CL 101  CO2 24  GLUCOSE 136*  BUN 7  CREATININE 0.73  CALCIUM 8.9  MG --  PHOS --    No results found for this basename: INR:5 in the last 168 hours  No results found for this basename: PHART:5,PCO2:5,PCO2ART:5,PO2:5,PO2ART:5,HCO3:5,TCO2:5,O2SAT:5 in the last 168 hours   Radiology:    Assessment and Plan: Principal Problem:  *Pneumothorax, left - chest tube to suction ,  if no air leak in am, can place to water seal In v/o prior pnthx, would recommend pleurodesis with talc  Active Problems:  COPD (chronic obstructive pulmonary disease) - ct symbicort, duonebs q 6h prn  ETOH abuse - w/f withdrawal, CIWA protocol    Best practices / Disposition: -->Code Status: full -->DVT Px: lovenox -->GI Px: none -->Diet: full    Canary Brim, NP-C [place] [facility] Pgr: [phone]  Independently examined pt, evaluated data & formulated above care plan with NP  [facility]

## 2011-09-16 NOTE — Op Note (Signed)
CARDIOTHORACIC SURGERY OPERATIVE NOTE  Date of Procedure:  09/16/2011  Preoperative Diagnosis: Left Spontaneous Pneumothorax  Postoperative Diagnosis: Same  Procedure:   Left chest tube placement  Surgeon:   Salvatore Decent. [name], MD  Anesthesia: 1% lidocaine local with intravenous sedation    DETAILS OF THE OPERATIVE PROCEDURE  Following full informed consent the patient was given midazolam 4 mg and morphine 2 mg intravenously and continuously monitored for rhythm, BP and oxygen saturation. The left chest was prepared and draped in a sterile manner. 1% lidocaine was utilized to anesthetize the skin and subcutaneous tissues. A small incision was made and a 28 French straight chest tube was placed through the incision into the pleural space. The tube was secured to the skin and connected to a closed suction collection device. The patient tolerated the procedure well. A portable CXR was ordered. There were no complications.    Salvatore Decent. [name], MD 09/16/2011 4:21 PM

## 2011-09-16 NOTE — ED Notes (Signed)
VO from Dr. Tressie Stalker to administer 2mg  of Versed IV. GIven at 1555

## 2011-09-16 NOTE — ED Notes (Signed)
Pt with SOB since 1pm, 125 solumedrol and 2 nebs given enroute, pt hunched over, very difficulty breathing

## 2011-09-16 NOTE — ED Provider Notes (Signed)
History     CSN: [id number]  Arrival date & time 09/16/11  1420   First MD Initiated Contact with Patient 09/16/11 1425      Chief Complaint  Patient presents with  . Shortness of Breath    (Consider location/radiation/quality/duration/timing/severity/associated sxs/prior treatment) HPI Comments:  History of COPD presenting with respiratory distress. Has had difficulty breathing for about the past hour and a half. Denies chest pain.  Has had pain between shoulder blades for 1 week.  Chronic moist cough.  No fever.  No leg pain or swelling.  Was getting ready to go to work when became very SOB.  No abdominal pain, vomiting. Used home inhalers without relief.  The history is provided by the patient and the EMS personnel. The history is limited by the condition of the patient.    Past Medical History  Diagnosis Date  . Thyroid disease   . COPD (chronic obstructive pulmonary disease)     Past Surgical History  Procedure Date  . Splenectomy     History reviewed. No pertinent family history.  History  Substance Use Topics  . Smoking status: Current Everyday Smoker -- 0.2 packs/day  . Smokeless tobacco: Not on file  . Alcohol Use: 3.6 oz/week    6 Cans of beer per week      Review of Systems  Unable to perform ROS: Unstable vital signs  Respiratory: Positive for shortness of breath.     Allergies  Codeine  Home Medications   Current Outpatient Rx  Name Route Sig Dispense Refill  . ALBUTEROL SULFATE HFA 108 (90 BASE) MCG/ACT IN AERS Inhalation Inhale 2 puffs into the lungs every 6 (six) hours as needed. wheezing    . BUDESONIDE-FORMOTEROL FUMARATE 160-4.5 MCG/ACT IN AERO Inhalation Inhale 2 puffs into the lungs 2 (two) times daily.      BP 115/92  Pulse 113  Temp(Src) 97.9 F (36.6 C) (Oral)  Resp 17  SpO2 98%  Physical Exam  Constitutional: He is oriented to person, place, and time. He appears well-developed and well-nourished. He appears distressed.   Severe respiratory distress with tripoding and pursed lips  HENT:  Head: Normocephalic and atraumatic.  Mouth/Throat: Oropharynx is clear and moist. No oropharyngeal exudate.  Eyes: Conjunctivae and EOM are normal. Pupils are equal, round, and reactive to light.  Neck: Normal range of motion. Neck supple.  Cardiovascular: Normal rate, regular rhythm and normal heart sounds.   No murmur heard. Pulmonary/Chest: Breath sounds normal. He is in respiratory distress.       Poor air exchange.  Abdominal: Soft. There is no tenderness. There is no rebound and no guarding.  Musculoskeletal: Normal range of motion. He exhibits no edema and no tenderness.  Neurological: He is alert and oriented to person, place, and time. No cranial nerve deficit.  Skin: Skin is warm.    ED Course  Procedures (including critical care time)  Labs Reviewed  CBC - Abnormal; Notable for the following:    WBC 16.6 (*)    RBC 5.87 (*)    Hemoglobin 17.5 (*)    All other components within normal limits  BASIC METABOLIC PANEL - Abnormal; Notable for the following:    Glucose, Bld 136 (*)    All other components within normal limits  DIFFERENTIAL  TROPONIN I   Dg Chest Portable 1 View  09/16/2011  *RADIOLOGY REPORT*  Clinical Data: Shortness of breath, respiratory failure  PORTABLE CHEST - 1 VIEW  Comparison: Portable exam 1447 hours compared  to 01/05/2011  Findings: Normal heart size, mediastinal contours, and pulmonary vascularity. Moderate to large left pneumothorax with partial atelectasis of the left lower lobe. No mediastinal shift. Underlying emphysematous changes. Infiltrate versus atelectasis in the right lower lobe. Right apex scarring stable. Old left rib fractures.  IMPRESSION: Moderate-to-large pneumothorax without mediastinal shift. Partial atelectasis bilateral lower lobes. Underlying COPD and right apex scarring.  Critical Value/emergent results were called by telephone at the time of interpretation on  09/16/2011  at 1517 hours  to  Dr. Manus Gunning, who verbally acknowledged these results.  Original Report Authenticated By: Lollie Marrow, M.D.     No diagnosis found.    MDM  Respiratory failure, COPD exacerbation, poor air exchange. Received nebs x2 and solumedrol via EMS.  ABG, labs, CXR, nebs, magnesium  New large pneumothorax on CXR.  History of chest tube x2.  No mediastinal shift.  Respiratory status stable on bipap.  ABG without CO2 retention after on bipap x 30 mints.   Date: 09/16/2011  Rate: 120  Rhythm: sinus tachycardia  QRS Axis: normal  Intervals: normal  ST/T Wave abnormalities: nonspecific ST/T changes  Conduction Disutrbances:none  Narrative Interpretation:   Old EKG Reviewed: unchanged   CRITICAL CARE Performed by: Glynn Octave   Total critical care time: 30  Critical care time was exclusive of separately billable procedures and treating other patients.  Critical care was necessary to treat or prevent imminent or life-threatening deterioration.  Critical care was time spent personally by me on the following activities: development of treatment plan with patient and/or surrogate as well as nursing, discussions with consultants, evaluation of patient's response to treatment, examination of patient, obtaining history from patient or surrogate, ordering and performing treatments and interventions, ordering and review of laboratory studies, ordering and review of radiographic studies, pulse oximetry and re-evaluation of patient's condition.    Glynn Octave, MD 09/16/11 [phone]

## 2011-09-17 ENCOUNTER — Inpatient Hospital Stay (HOSPITAL_COMMUNITY): Payer: Medicare Other

## 2011-09-17 DIAGNOSIS — J930 Spontaneous tension pneumothorax: Secondary | ICD-10-CM

## 2011-09-17 LAB — BASIC METABOLIC PANEL
Calcium: 9.2 mg/dL (ref 8.4–10.5)
Creatinine, Ser: 0.8 mg/dL (ref 0.50–1.35)
GFR calc Af Amer: >90 mL/min (ref >90)
GFR calc non Af Amer: >90 mL/min (ref >90)
Sodium: 136 mEq/L (ref 135–145)

## 2011-09-17 LAB — MAGNESIUM: Magnesium: 2.3 mg/dL (ref 1.5–2.5)

## 2011-09-17 LAB — PHOSPHORUS: Phosphorus: 3.3 mg/dL (ref 2.3–4.6)

## 2011-09-17 LAB — CBC
MCH: 29.8 pg (ref 26.0–34.0)
MCV: 88.2 fL (ref 78.0–100.0)
Platelets: 289 10*3/uL (ref 150–400)
RBC: 5.16 MIL/uL (ref 4.22–5.81)
RDW: 14.7 % (ref 11.5–15.5)
WBC: 13.2 10*3/uL — ABNORMAL HIGH (ref 4.0–10.5)

## 2011-09-17 MED ORDER — GUAIFENESIN ER 600 MG PO TB12
1200.0000 mg | ORAL_TABLET | Freq: Two times a day (BID) | ORAL | Status: DC
  Administered 2011-09-17 – 2011-09-21 (×10): 1200 mg via ORAL
  Filled 2011-09-17 (×14): qty 2

## 2011-09-17 MED ORDER — CLONIDINE HCL 0.1 MG PO TABS
0.0500 mg | ORAL_TABLET | Freq: Three times a day (TID) | ORAL | Status: DC
  Administered 2011-09-17 – 2011-09-18 (×3): 0.05 mg via ORAL
  Filled 2011-09-17 (×5): qty 0.5

## 2011-09-17 MED ORDER — TRAMADOL HCL 50 MG PO TABS
100.0000 mg | ORAL_TABLET | Freq: Four times a day (QID) | ORAL | Status: DC
  Administered 2011-09-17 – 2011-09-22 (×20): 100 mg via ORAL
  Filled 2011-09-17 (×19): qty 2

## 2011-09-17 MED ORDER — TRAMADOL HCL 50 MG PO TABS
50.0000 mg | ORAL_TABLET | Freq: Four times a day (QID) | ORAL | Status: DC | PRN
  Administered 2011-09-17: 50 mg via ORAL
  Filled 2011-09-17 (×3): qty 1

## 2011-09-17 MED ORDER — IOHEXOL 300 MG/ML  SOLN
80.0000 mL | Freq: Once | INTRAMUSCULAR | Status: AC | PRN
  Administered 2011-09-17: 80 mL via INTRAVENOUS

## 2011-09-17 NOTE — Progress Notes (Addendum)
[address]            [place] [address]          [phone]          Subjective: Complains of pain at chest tube site   Objective  Telemetry sinus rhythm  Temp:  [97.9 F (36.6 C)-98.5 F (36.9 C)] 98.2 F (36.8 C) (05/11 0441) Pulse Rate:  [65-126] 65  (05/11 0445) Resp:  [16-28] 19  (05/11 0441) BP: (115-157)/(72-97) 119/72 mmHg (05/11 0441) SpO2:  [89 %-99 %] 96 % (05/11 0751) Weight:  [116 lb 13.5 oz (53 kg)-121 lb 12.8 oz (55.248 kg)] 121 lb 12.8 oz (55.248 kg) (05/11 0441)   Intake/Output Summary (Last 24 hours) at 09/17/11 0935 Last data filed at 09/17/11 0600  Gross per 24 hour  Intake   1080 ml  Output    310 ml  Net    770 ml       General appearance: alert, appears older than stated age and fatigued Heart: regular rate and rhythm and S1, S2 normal Lungs: exp wheeze in upper airways  Lab Results:  Basename 09/17/11 0725 09/16/11 1425  NA 136 136  K 4.7 4.6  CL 97 101  CO2 27 24  GLUCOSE 122* 136*  BUN 12 7  CREATININE 0.80 0.73  CALCIUM 9.2 8.9  MG 2.3 --  PHOS 3.3 --   No results found for this basename: AST:2,ALT:2,ALKPHOS:2,BILITOT:2,PROT:2,ALBUMIN:2 in the last 72 hours No results found for this basename: LIPASE:2,AMYLASE:2 in the last 72 hours  Basename 09/17/11 0725 09/16/11 1425  WBC 13.2* 16.6*  NEUTROABS -- 8.2*  HGB 15.4 17.5*  HCT 45.5 52.0  MCV 88.2 88.6  PLT 289 315    Basename 09/16/11 1425  CKTOTAL --  CKMB --  TROPONINI <0.30   No components found with this basename: POCBNP:3 No results found for this basename: DDIMER in the last 72 hours No results found for this basename: HGBA1C in the last 72 hours No results found for this basename: CHOL,HDL,LDLCALC,TRIG,CHOLHDL in the last 72 hours No results found for this basename: TSH,T4TOTAL,FREET3,T3FREE,THYROIDAB in the last 72 hours No results found for this basename: VITAMINB12,FOLATE,FERRITIN,TIBC,IRON,RETICCTPCT in the last 72  hours  Medications: Scheduled    . albuterol  2.5 mg Nebulization Q6H  . albuterol      . aspirin  324 mg Oral NOW   Or  . aspirin  300 mg Rectal NOW  . budesonide-formoterol  2 puff Inhalation BID  . folic acid  1 mg Oral Daily  . ipratropium  0.5 mg Nebulization Q6H  . LORazepam  0-4 mg Oral Q6H   Followed by  . LORazepam  0-4 mg Oral Q12H  . magnesium sulfate  2 g Intravenous Once  . methylPREDNISolone sodium succinate      . midazolam  2 mg Intravenous Once  . midazolam  2 mg Intravenous Once  .  morphine injection  2 mg Intravenous Once  . morphine      . moxifloxacin  400 mg Intravenous Once  . mulitivitamin with minerals  1 tablet Oral Daily  . pantoprazole  20 mg Oral Q1200  . thiamine  100 mg Oral Daily   Or  . thiamine  100 mg Intravenous Daily  . DISCONTD: albuterol  2.5 mg Nebulization Once  . DISCONTD: ipratropium  0.5 mg Nebulization Once  Radiology/Studies:  Dg Chest Port 1 View  09/17/2011  *RADIOLOGY REPORT*  Clinical Data: Follow up pneumothorax  PORTABLE CHEST - 1 VIEW  Comparison: 09/16/2011  Findings: There is a left-sided chest tube in place.  There is a left-sided chest tube in place.  No pneumothorax is identified.  Heart size appears normal.  Patchy interstitial and airspace densities which appear lower lobe predominant are unchanged from previous exam.  No pleural effusions noted.  IMPRESSION:  1. No significant change in aeration the lungs compared with prior exam. 2.  Stable left chest tube without visible pneumothorax.  Original Report Authenticated By: Rosealee Albee, M.D.   Dg Chest Port 1 View  09/16/2011  *RADIOLOGY REPORT*  Clinical Data: Chest tube placement, pneumothorax  PORTABLE CHEST - 1 VIEW  Comparison: Portable exam 1652 hours compared to 1447 hours  Findings: New left thoracostomy tube with resolution of previously identified left pneumothorax. Stable heart size mediastinal contours. Bibasilar atelectasis and underlying  emphysematous changes. Right apex scarring. Old left rib and left clavicular fractures.  IMPRESSION: Resolution of left pneumothorax post thoracostomy tube placement. Emphysematous changes with bibasilar atelectasis.  Original Report Authenticated By: Lollie Marrow, M.D.   Dg Chest Portable 1 View  09/16/2011  *RADIOLOGY REPORT*  Clinical Data: Shortness of breath, respiratory failure  PORTABLE CHEST - 1 VIEW  Comparison: Portable exam 1447 hours compared to 01/05/2011  Findings: Normal heart size, mediastinal contours, and pulmonary vascularity. Moderate to large left pneumothorax with partial atelectasis of the left lower lobe. No mediastinal shift. Underlying emphysematous changes. Infiltrate versus atelectasis in the right lower lobe. Right apex scarring stable. Old left rib fractures.  IMPRESSION: Moderate-to-large pneumothorax without mediastinal shift. Partial atelectasis bilateral lower lobes. Underlying COPD and right apex scarring.  Critical Value/emergent results were called by telephone at the time of interpretation on 09/16/2011  at 1517 hours  to  Dr. Manus Gunning, who verbally acknowledged these results.  Original Report Authenticated By: Lollie Marrow, M.D.   Chest tube  + air leak, minimal bloody drainage  INR: Will add last result for INR, ABG once components are confirmed Will add last 4 CBG results once components are confirmed  Assessment/Plan: S/P  left chest tube for recurrent spont pntx Keep chest tube to suction Add an oral pain agent resp rx as per primary service   LOS: 1 day    GOLD,WAYNE E 5/11/20139:35 AM    Chart reviewed, patient examined, agree with above. No air leak from chest tube. He says he had a spontaneous ptx on the left side about a year ago.  He had chest CT in 2010 here that showed significant bullous emphysema and some small lung nodules/densities.  Will get a followup CT while here.  We may be able to get him through this without surgery but if  there is a large bleb on the left, then could consider VATS for bleb resection and pleurodesis to try to prevent another ptx on the left.  He continues to smoke and strikes me as a noncompliant patient so I doubt that he is going to stop.

## 2011-09-17 NOTE — Progress Notes (Signed)
Name: Philip Coleman MRN: [id number] DOB: July 09, 1955    LOS: 1  [facility]   History of Present Illness:  19 y/owm active smoker with PMH of Hodgkin's Disease, Thyroid Disease, Severe COPD, h/o Recurrent spontaneous Pnuemothorax adm 5/10 with lt spon pnthx requiring chest tube. Transiently placed on bipap in ER. Chest tube placed by TCTS [name]) Followed by pulm at [facility]     Tests / Events:   Subjective: Lots of pain at chest tube site, some rattling cough but not bringing up any mucus   Vital Signs: BP 114/68  Pulse 68  Temp(Src) 98.2 F (36.8 C) (Oral)  Resp 19  Ht 5\' 7"  (1.702 m)  Wt 121 lb 12.8 oz (55.248 kg)  BMI 19.08 kg/m2  SpO2 96%   Intake/Output      05/10 0701 - 05/11 0700 05/11 0701 - 05/12 0700   P.O. 840    I.V. (mL/kg) 240 (4.3)    Total Intake(mL/kg) 1080 (19.5)    Urine (mL/kg/hr) 300 (0.2)    Chest Tube 10    Total Output 310    Net +770         Urine Occurrence 1000 x           Physical Examination: Gen. Very anxious thin wm/ congested coughing. ENT - no lesions, no post nasal drip Neck: No JVD, no thyromegaly, no carotid bruits Lungs: no use of accessory muscles, no dullness to percussion, decreased BL without rales or rhonchi , chest tube no air leak Cardiovascular: Rhythm regular, heart sounds  normal, no murmurs, no peripheral edema Abdomen: soft and non-tender, no hepatosplenomegaly, BS normal. Musculoskeletal: No deformities, no cyanosis or clubbing Neuro:  alert, non focal Skin:  Warm, no lesions/ rash    Labs    CBC  Lab 09/17/11 0725 09/16/11 1425  HGB 15.4 17.5*  HCT 45.5 52.0  WBC 13.2* 16.6*  PLT 289 315     BMET  Lab 09/17/11 0725 09/16/11 1425  NA 136 136  K 4.7 4.6  CL 97 101  CO2 27 24  GLUCOSE 122* 136*  BUN 12 7  CREATININE 0.80 0.73  CALCIUM 9.2 8.9  MG 2.3 --  PHOS 3.3 --    No results found for this basename: INR:5 in the last 168 hours   Lab 09/16/11 1517   PHART 7.345*  PCO2ART 42.6  PO2ART 146.0*  HCO3 23.2  TCO2 25  O2SAT 99.0     Radiology: pcxr 5/11 > No significant change in aeration the lungs compared with prior  exam.  2. Stable left chest tube without visible pneumothorax.     Assessment and Plan: Principal Problem:  Pneumothorax, left   - s/p L CT 5/10(T surg)>> In v/o prior pnthx, would recommend consideration for pleurodesis with talc     COPD (chronic obstructive pulmonary disease)  Active smoking pta - ct symbicort, duonebs q 6h prn    ETOH abuse  - w/f withdrawal, CIWA protocol  Pain control issues  - add clonidine plus tramadol qid 5/11 (aware codeine allergy not anaphylactoid)    Best practices / Disposition: -->Code Status: full -->DVT Px: lovenox -->GI Px: none -->Diet: full    Sandrea Hughs, MD Pulmonary and Critical Care Medicine [facility] Cell [phone]

## 2011-09-18 MED ORDER — OXYCODONE HCL 10 MG PO TB12
10.0000 mg | ORAL_TABLET | Freq: Two times a day (BID) | ORAL | Status: DC
  Administered 2011-09-18 – 2011-09-22 (×8): 10 mg via ORAL
  Filled 2011-09-18 (×9): qty 1

## 2011-09-18 MED ORDER — CLONIDINE HCL 0.1 MG PO TABS
0.1000 mg | ORAL_TABLET | Freq: Three times a day (TID) | ORAL | Status: DC
  Administered 2011-09-18 – 2011-09-21 (×10): 0.1 mg via ORAL
  Filled 2011-09-18 (×14): qty 1

## 2011-09-18 NOTE — Progress Notes (Addendum)
[address]            [place] [address]          [phone]          Subjective: Feels poorly, + cough/wheezing this am  Objective  Telemetry SR  Temp:  [98.5 F (36.9 C)-98.7 F (37.1 C)] 98.5 F (36.9 C) (05/12 0411) Pulse Rate:  [68-79] 74  (05/12 0411) Resp:  [16-18] 18  (05/12 0411) BP: (114-127)/(68-75) 117/75 mmHg (05/12 0411) SpO2:  [91 %-96 %] 94 % (05/12 0411) Weight:  [118 lb 12.8 oz (53.887 kg)] 118 lb 12.8 oz (53.887 kg) (05/12 0411)   Intake/Output Summary (Last 24 hours) at 09/18/11 0736 Last data filed at 09/18/11 0249  Gross per 24 hour  Intake   1680 ml  Output    115 ml  Net   1565 ml       General appearance: alert, cooperative, appears older than stated age, fatigued and no distress Heart: regular rate and rhythm Lungs: coarse with scattered exp wheeze Abdomen: soft, nontender Extremities: no edema Wound: chest tube site ok  Lab Results:  Basename 09/17/11 0725 09/16/11 1425  NA 136 136  K 4.7 4.6  CL 97 101  CO2 27 24  GLUCOSE 122* 136*  BUN 12 7  CREATININE 0.80 0.73  CALCIUM 9.2 8.9  MG 2.3 --  PHOS 3.3 --   No results found for this basename: AST:2,ALT:2,ALKPHOS:2,BILITOT:2,PROT:2,ALBUMIN:2 in the last 72 hours No results found for this basename: LIPASE:2,AMYLASE:2 in the last 72 hours  Basename 09/17/11 0725 09/16/11 1425  WBC 13.2* 16.6*  NEUTROABS -- 8.2*  HGB 15.4 17.5*  HCT 45.5 52.0  MCV 88.2 88.6  PLT 289 315    Basename 09/16/11 1425  CKTOTAL --  CKMB --  TROPONINI <0.30   No components found with this basename: POCBNP:3 No results found for this basename: DDIMER in the last 72 hours No results found for this basename: HGBA1C in the last 72 hours No results found for this basename: CHOL,HDL,LDLCALC,TRIG,CHOLHDL in the last 72 hours No results found for this basename: TSH,T4TOTAL,FREET3,T3FREE,THYROIDAB in the last 72 hours No results found for this basename:  VITAMINB12,FOLATE,FERRITIN,TIBC,IRON,RETICCTPCT in the last 72 hours  Medications: Scheduled    . albuterol  2.5 mg Nebulization Q6H  . budesonide-formoterol  2 puff Inhalation BID  . cloNIDine  0.05 mg Oral TID  . folic acid  1 mg Oral Daily  . guaiFENesin  1,200 mg Oral BID  . ipratropium  0.5 mg Nebulization Q6H  . LORazepam  0-4 mg Oral Q6H   Followed by  . LORazepam  0-4 mg Oral Q12H  . mulitivitamin with minerals  1 tablet Oral Daily  . pantoprazole  20 mg Oral Q1200  . thiamine  100 mg Oral Daily   Or  . thiamine  100 mg Intravenous Daily  . traMADol  100 mg Oral Q6H     Radiology/Studies:  Ct Chest W Contrast  09/17/2011  *RADIOLOGY REPORT*  Clinical Data: Recurrent spontaneous pneumothorax.  COPD.  CT CHEST WITH CONTRAST  Technique:  Multidetector CT imaging of the chest was performed following the standard protocol during bolus administration of intravenous contrast.  Contrast: 80mL OMNIPAQUE IOHEXOL 300 MG/ML  SOLN  Comparison: Plain films of the chest 09/17/2011, 01/05/2011 09/16/2011.  CT chest 03/17/2009.  Findings: The patient has a left chest tube in  place.  There are very small bilateral pleural effusions, greater on the left.  No pericardial effusion.  No axillary, hilar or mediastinal lymphadenopathy is identified.  Lungs demonstrate severe emphysematous disease.  There is unchanged apical opacity on the right compared to prior CT.  The patient has a tiny left basilar pneumothorax.  Bullous change on the right is again seen as on the prior CT.  There appears to be some mucoid material in the right mainstem bronchus.  No focal abnormality is seen in the incidentally imaged upper abdomen.  Surgical clips posterior to the fundus the stomach are noted.  The patient has an old left clavicle fracture and left rib fractures.  IMPRESSION:  1.  Very small left basilar pneumothorax with a chest tube in place. 2.  Severe emphysema. 3.  Very small bilateral pleural effusions.   Original Report Authenticated By: Bernadene Bell. Maricela Curet, M.D.   Dg Chest Port 1 View  09/17/2011  *RADIOLOGY REPORT*  Clinical Data: Follow up pneumothorax  PORTABLE CHEST - 1 VIEW  Comparison: 09/16/2011  Findings: There is a left-sided chest tube in place.  There is a left-sided chest tube in place.  No pneumothorax is identified.  Heart size appears normal.  Patchy interstitial and airspace densities which appear lower lobe predominant are unchanged from previous exam.  No pleural effusions noted.  IMPRESSION:  1. No significant change in aeration the lungs compared with prior exam. 2.  Stable left chest tube without visible pneumothorax.  Original Report Authenticated By: Rosealee Albee, M.D.   Dg Chest Port 1 View  09/16/2011  *RADIOLOGY REPORT*  Clinical Data: Chest tube placement, pneumothorax  PORTABLE CHEST - 1 VIEW  Comparison: Portable exam 1652 hours compared to 1447 hours  Findings: New left thoracostomy tube with resolution of previously identified left pneumothorax. Stable heart size mediastinal contours. Bibasilar atelectasis and underlying emphysematous changes. Right apex scarring. Old left rib and left clavicular fractures.  IMPRESSION: Resolution of left pneumothorax post thoracostomy tube placement. Emphysematous changes with bibasilar atelectasis.  Original Report Authenticated By: Lollie Marrow, M.D.   Dg Chest Portable 1 View  09/16/2011  *RADIOLOGY REPORT*  Clinical Data: Shortness of breath, respiratory failure  PORTABLE CHEST - 1 VIEW  Comparison: Portable exam 1447 hours compared to 01/05/2011  Findings: Normal heart size, mediastinal contours, and pulmonary vascularity. Moderate to large left pneumothorax with partial atelectasis of the left lower lobe. No mediastinal shift. Underlying emphysematous changes. Infiltrate versus atelectasis in the right lower lobe. Right apex scarring stable. Old left rib fractures.  IMPRESSION: Moderate-to-large pneumothorax without mediastinal  shift. Partial atelectasis bilateral lower lobes. Underlying COPD and right apex scarring.  Critical Value/emergent results were called by telephone at the time of interpretation on 09/16/2011  at 1517 hours  to  Dr. Manus Gunning, who verbally acknowledged these results.  Original Report Authenticated By: Lollie Marrow, M.D.   Chest tube- no air leak currently   INR: Will add last result for INR, ABG once components are confirmed Will add last 4 CBG results once components are confirmed  Assessment/Plan: S/P  left chest tube  1. Will try H2O seal and check CXR in am, severe emphysema on CT scan. 2 pulm rx per primary svc   LOS: 2 days    GOLD,WAYNE E 5/12/20137:36 AM     Chart reviewed, patient examined, agree with above. He doesn't have an air leak from chest tube.  Chest CT shows severe emphysematous changes with large bilateral bullae. It is  probably best to try to manage this conservatively with chest tube if possible and avoid surgery. He lives in [place] and his pulmonary med physician is at [facility] so he does have follow up there.

## 2011-09-18 NOTE — Progress Notes (Signed)
Name: BLAYN WHETSELL MRN: [id number] DOB: 08/29/1955    LOS: 2  [facility]   History of Present Illness:  11 y/owm active smoker with PMH of Hodgkin's Disease, Thyroid Disease, Severe COPD, h/o Recurrent spontaneous Pnuemothorax adm 5/10 with lt spon pnthx requiring chest tube. Transiently placed on bipap in ER. Chest tube placed by TCTS [name]) Followed by pulm at [facility]     Tests / Events:   Subjective: Lots of pain at chest tube site, some rattling cough but not bringing up any mucus   Vital Signs: BP 112/68  Pulse 95  Temp(Src) 98.5 F (36.9 C) (Oral)  Resp 18  Ht 5\' 7"  (1.702 m)  Wt 118 lb 12.8 oz (53.887 kg)  BMI 18.61 kg/m2  SpO2 93%   Intake/Output      05/11 0701 - 05/12 0700 05/12 0701 - 05/13 0700   P.O. 480    I.V. (mL/kg) 1200 (22.3)    Total Intake(mL/kg) 1680 (31.2)    Urine (mL/kg/hr) 100 (0.1)    Chest Tube 15    Total Output 115    Net +1565                Physical Examination: Gen. Very anxious thin wm/ congested coughing. ENT - no lesions, no post nasal drip Neck: No JVD, no thyromegaly, no carotid bruits Lungs: no use of accessory muscles, no dullness to percussion, decreased BL without rales or rhonchi , chest tube no air leak Cardiovascular: Rhythm regular, heart sounds  normal, no murmurs, no peripheral edema Abdomen: soft and non-tender, no hepatosplenomegaly, BS normal. Musculoskeletal: No deformities, no cyanosis or clubbing Neuro:  alert, non focal Skin:  Warm, no lesions/ rash    Labs    CBC  Lab 09/17/11 0725 09/16/11 1425  HGB 15.4 17.5*  HCT 45.5 52.0  WBC 13.2* 16.6*  PLT 289 315     BMET  Lab 09/17/11 0725 09/16/11 1425  NA 136 136  K 4.7 4.6  CL 97 101  CO2 27 24  GLUCOSE 122* 136*  BUN 12 7  CREATININE 0.80 0.73  CALCIUM 9.2 8.9  MG 2.3 --  PHOS 3.3 --    No results found for this basename: INR:5 in the last 168 hours   Lab 09/16/11 1517  PHART 7.345*  PCO2ART  42.6  PO2ART 146.0*  HCO3 23.2  TCO2 25  O2SAT 99.0     Radiology: pcxr 5/11 > No significant change in aeration the lungs compared with prior  exam.  2. Stable left chest tube without visible pneumothorax.     Assessment and Plan: Principal Problem:  Pneumothorax, left   - s/p L CT 5/10(T surg)>> In v/o prior pnthx, would recommend consideration for pleurodesis with talc     COPD (chronic obstructive pulmonary disease)  Active smoking pta - ct symbicort, duonebs q 6h prn    ETOH abuse  - w/f withdrawal, CIWA protocol  Pain control issues  - added clonidine plus tramadol qid 5/11 (aware codeine allergy not anaphylactoid) - increased clonidine 5/12    Best practices / Disposition: -->Code Status: full -->DVT Px: lovenox -->GI Px: none -->Diet: full    Sandrea Hughs, MD Pulmonary and Critical Care Medicine [facility] Healthcare Cell [phone]

## 2011-09-19 ENCOUNTER — Inpatient Hospital Stay (HOSPITAL_COMMUNITY): Payer: Medicare Other

## 2011-09-19 DIAGNOSIS — J930 Spontaneous tension pneumothorax: Secondary | ICD-10-CM

## 2011-09-19 MED ORDER — HEPARIN SODIUM (PORCINE) 5000 UNIT/ML IJ SOLN
5000.0000 [IU] | Freq: Three times a day (TID) | INTRAMUSCULAR | Status: DC
  Administered 2011-09-19 – 2011-09-22 (×9): 5000 [IU] via SUBCUTANEOUS
  Filled 2011-09-19 (×14): qty 1

## 2011-09-19 NOTE — Progress Notes (Signed)
Name: Philip Coleman MRN: [id number] DOB: 1956-02-16    LOS: 3  [facility]   Primary Pulm:  Donzetta Starch @ [facility]  History of Present Illness:  87 y/owm active smoker with PMH of Hodgkin's Disease, Thyroid Disease, Severe COPD, h/o Recurrent spontaneous Pnuemothorax adm 5/10 with lt spon pnthx requiring chest tube. Transiently placed on bipap in ER. Chest tube placed by TCTS [name]) with immediate relief of dyspnea.        Tests / Events: 5/13 cxr with repeat PTX after water seal  Subjective: RN reports cxr with 20-30 pneumothorax.  Patient complains of chest tube site pain and overall "not feeling well".    Vital Signs: BP 126/76  Pulse 86  Temp(Src) 98.3 F (36.8 C) (Oral)  Resp 18  Ht 5\' 7"  (1.702 m)  Wt 118 lb 9.6 oz (53.797 kg)  BMI 18.58 kg/m2  SpO2 90%   Intake/Output      05/12 0701 - 05/13 0700 05/13 0701 - 05/14 0700   P.O. 480    I.V. (mL/kg)     Total Intake(mL/kg) 480 (8.9)    Urine (mL/kg/hr) 800 (0.6)    Chest Tube 15    Total Output 815    Net -335             Physical Examination: Gen.  Thin wm/ congested coughing. ENT - no lesions, no post nasal drip Neck: No JVD, no thyromegaly, no carotid bruits Lungs: no use of accessory muscles, no dullness to percussion, decreased BL without rales or rhonchi , chest tube no air leak Cardiovascular: Rhythm regular, heart sounds  normal, no murmurs, no peripheral edema Abdomen: soft and non-tender, no hepatosplenomegaly, BS normal. Musculoskeletal: No deformities, no cyanosis or clubbing Neuro:  alert, non focal Skin:  Warm, no lesions/ rash    Labs    CBC  Lab 09/17/11 0725 09/16/11 1425  HGB 15.4 17.5*  HCT 45.5 52.0  WBC 13.2* 16.6*  PLT 289 315   BMET  Lab 09/17/11 0725 09/16/11 1425  NA 136 136  K 4.7 4.6  CL 97 101  CO2 27 24  GLUCOSE 122* 136*  BUN 12 7  CREATININE 0.80 0.73  CALCIUM 9.2 8.9  MG 2.3 --  PHOS 3.3 --    Lab 09/16/11 1517   PHART 7.345*  PCO2ART 42.6  PO2ART 146.0*  HCO3 23.2  TCO2 25  O2SAT 99.0     Radiology: pcxr 5/11 > No significant change in aeration the lungs compared with prior exam. Stable left chest tube without visible pneumothorax.  CXR 5/13>>>New moderate sized left hydropneumothorax with left chest tube is stable position.     Assessment and Plan:  Spontaneous Left Pneumothorax Hx of previous L & R PTX with chest tubes.  L CT placed per Dr. [name] on 5/10.  5/13 with recurrent ptx after water seal.   Plan: -L CT per TCTS. -recommend consideration for pleurodesis  -f/u cxr at 1300 and in am   COPD (chronic obstructive pulmonary disease)  Active smoking PTA Plan: -continue symbicort -duonebs q 6h    ETOH abuse  Consumes 1-10 beers per day, but indicates he can go without and not get the shakes for a day Plan: -observe for withdrawal -CIWA protocol -MVI / Folate / Thiamine  Pain control issues Plan: -added clonidine plus tramadol qid 5/11 (aware codeine allergy not anaphylactoid) -increased clonidine 5/12 -oxycodone Q12, tramadol Q6 -PRN morphine    Best practices / Disposition: -->Code  Status: full -->DVT Px: SCD's / sq heparin -->GI Px: none -->Diet: full   Attending: I have seen and examined the patient with nurse practitioner/resident and agree with the note above.   Chest tube to suction today given recurrent pneumothorax this AM.  Consider pleuridesis.  [name] PCCM Pager: [phone] Cell: [phone] If no response, call [phone]

## 2011-09-19 NOTE — Progress Notes (Signed)
Chest xray results from this am; new pneumothorax 20-30% pneumo per radiologist. Paged Brandi,NP with pulmonary earlier this am, no reply. Attempted twice more to reach pulmonary MD, was able to reach Dr Kendrick Fries, and he was informed of results. Ordered to  place  CT back on 20cm suction, done so.  Pt informed.

## 2011-09-19 NOTE — Progress Notes (Addendum)
   Subjective: Patient states he seems a little more short of breath this am;however, he is no acute distress.   Objective: Vital signs in last 24 hours: Patient Vitals for the past 24 hrs:  BP Temp Temp src Pulse Resp SpO2 Weight  09/19/11 0758 - - - - - 90 % -  09/19/11 0417 126/76 mmHg 98.3 F (36.8 C) Oral 86  18  91 % 118 lb 9.6 oz (53.797 kg)  09/18/11 2048 120/72 mmHg 98.6 F (37 C) Oral 99  17  96 % -  09/18/11 1654 132/88 mmHg - - - - - -  09/18/11 1542 138/90 mmHg 99.1 F (37.3 C) Oral 96  16  91 % -  09/18/11 1422 - - - - - 93 % -  09/18/11 1034 112/68 mmHg - - - - - -    Current Weight  09/19/11 118 lb 9.6 oz (53.797 kg)      Intake/Output from previous day: 05/12 0701 - 05/13 0700 In: 480 [P.O.:480] Out: 815 [Urine:800; Chest Tube:15]   Physical Exam:  Cardiovascular: RRR, no murmurs, gallops, or rubs. Pulmonary: Diminished on left; rhonchi.   Lab Results: CBC: Basename 09/17/11 0725 09/16/11 1425  WBC 13.2* 16.6*  HGB 15.4 17.5*  HCT 45.5 52.0  PLT 289 315   BMET:  Basename 09/17/11 0725 09/16/11 1425  NA 136 136  K 4.7 4.6  CL 97 101  CO2 27 24  GLUCOSE 122* 136*  BUN 12 7  CREATININE 0.80 0.73  CALCIUM 9.2 8.9    PT/INR: No results found for this basename: LABPROT,INR in the last 72 hours ABG:  INR: Will add last result for INR, ABG once components are confirmed Will add last 4 CBG results once components are confirmed  Assessment/Plan:  1.Pulmonary-Chest tube with minimal output.Chest tube is to water seal and there is no air leak.CXR this am shows a moderate-to-large right pneumothorax.Will place chest tube back to suction.Check CXR in a few hours and in am.Will likely need a VATS. 2.Placed on SQ heparin for DVT prophylaxis.  ZIMMERMAN,DONIELLE MPA-C 09/19/2011    Chart reviewed, patient examined, agree with above. He had large ptx on cxr this am and is now back on suction.  There is no active air leak seen. I think he will  require VATS for bleb stapling and pleurodesis.  I discussed this with him and he agrees to proceed. Will probably do Wednesday.

## 2011-09-19 NOTE — CV Procedure (Signed)
Left Chest Tube Insertion  Indication:  Persistent left ptx with left chest tube in place.  Informed consent obtained Sterile prep and drape 10cc 1% lidocaine local and 4mg  IV morphine 16F chest tube inserted without difficuty. CXR pending

## 2011-09-19 NOTE — Progress Notes (Signed)
UR Completed.  Philip Coleman [phone] 09/19/2011  

## 2011-09-19 NOTE — Care Management Note (Signed)
    Page 1 of 2   09/26/2011     12:01:44 PM   CARE MANAGEMENT NOTE 09/26/2011  Patient:  Philip Coleman, Philip Coleman   Account Number:  [id number]  Date Initiated:  09/19/2011  Documentation initiated by:  Avie Arenas  Subjective/Objective Assessment:   Spontaeous pneumo  Lives alone     Action/Plan:   Anticipated DC Date:  09/23/2011   Anticipated DC Plan:  HOME W HOME HEALTH SERVICES      DC Planning Services  CM consult      [facility] Choice  DURABLE MEDICAL EQUIPMENT   Choice offered to / List presented to:  C-1 Patient   DME arranged  [facility]      DME agency  [facility]        Status of service:  Completed, signed off Medicare Important Message given?   (If response is "NO", the following Medicare IM given date fields will be blank) Date Medicare IM given:   Date Additional Medicare IM given:    Discharge Disposition:  HOME/SELF CARE  Per UR Regulation:  Reviewed for med. necessity/level of care/duration of stay  If discussed at Long Length of Stay Meetings, dates discussed:    Comments:  Contact - friend Lois Huxley [phone]   09/26/11- 1155- Donn Pierini RN, BSN [phone] Pt with order for home 02, spoke with pt at bedside regarding need for home O2- choice for DME agency given to pt- pt does not have preference and is fine useing [facility] for services. Referral called to Derrian with [facility] and placed in TLC. Pt to d/c home once oxygen delivered to room.  09-19-11 9am Avie Arenas, RNBSN [phone] UR Completed.

## 2011-09-19 NOTE — Op Note (Signed)
NAME:  Philip Coleman, PEPITONE NO.:  [id number]  MEDICAL RECORD NO.:  [id number]  LOCATION:  2006                         FACILITY:  [facility]  PHYSICIAN:  [name], M.D.     DATE OF BIRTH:  [birth year]  DATE OF PROCEDURE:  09/19/2011 DATE OF DISCHARGE:                              OPERATIVE REPORT   PREOPERATIVE DIAGNOSIS:  Persistent left pneumothorax with chest tube in place.  POSTOPERATIVE DIAGNOSIS:  Persistent left pneumothorax with chest tube in place.  OPERATIVE PROCEDURE:  Insertion of 28-French left chest tube.  SURGEON:  [name], MD  ANESTHESIA:  1% lidocaine local.  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  Left chest tube was inserted on Friday in the emergency room with re- expansion of his lung.  This morning, chest x-ray showed about 40% left pneumothorax despite having the chest tube in on water seal.  Chest tube was placed to suction, but there was no significant air that came out of it and the patient's lung continued to be collapsed on followup chest x- ray.  Therefore, I felt it was important to place a new left chest tube. The procedure was discussed with the patient including an indications, benefits, and risks including, but not limited to bleeding, injury to lung, failure to re-expand the lung, infection, and he understood and agreed to proceed.  OPERATIVE PROCEDURE:  Informed consent was obtained from the patient. The procedure was performed in the patient's bed.  The patient was in the semirecumbent position.  He was given 4 mg of intravenous morphine. The left chest was prepped with Betadine solution and draped in usual sterile manner.  Lidocaine 1% local anesthesia was used to anesthetize the skin and subcutaneous tissue in the left anterior axillary line at about the eighth intercostal space.  Infiltration was continued down to the  pleural space where there was return of air.  I used about 10 mL of lidocaine.  Then, a 1-cm incision was made and carried down through the subcutaneous tissue bluntly using hemostat.  The pleural space was entered bluntly and there was a rush of air.  Then, the 28-French chest tube with a trocar was inserted into the pleural space and the trocar was slightly withdrawn.  The tube was directed posteriorly and up towards the apex.  I sutured the skin with a silk suture and connected to Pleur-Evac suction.  Dry sterile dressing was applied around this chest tube.  The other chest tube was kept in place and a dressing was placed around that tube also.  Followup chest x-ray is pending.    [name], M.D.    BB/MEDQ  D:  09/19/2011  T:  09/19/2011  Job:  [id number]

## 2011-09-20 ENCOUNTER — Inpatient Hospital Stay (HOSPITAL_COMMUNITY): Payer: Medicare Other

## 2011-09-20 DIAGNOSIS — E871 Hypo-osmolality and hyponatremia: Secondary | ICD-10-CM

## 2011-09-20 LAB — BASIC METABOLIC PANEL
BUN: 17 mg/dL (ref 6–23)
Calcium: 8.8 mg/dL (ref 8.4–10.5)
Creatinine, Ser: 0.8 mg/dL (ref 0.50–1.35)
GFR calc non Af Amer: >90 mL/min (ref >90)
Glucose, Bld: 87 mg/dL (ref 70–99)
Potassium: 4.1 mEq/L (ref 3.5–5.1)

## 2011-09-20 MED ORDER — ENSURE COMPLETE PO LIQD
237.0000 mL | Freq: Two times a day (BID) | ORAL | Status: DC
  Administered 2011-09-20 – 2011-09-21 (×2): 237 mL via ORAL

## 2011-09-20 MED ORDER — MAGNESIUM HYDROXIDE 400 MG/5ML PO SUSP
30.0000 mL | Freq: Every day | ORAL | Status: DC
  Administered 2011-09-20 – 2011-09-21 (×2): 30 mL via ORAL
  Filled 2011-09-20 (×3): qty 30

## 2011-09-20 NOTE — Progress Notes (Signed)
Name: Philip Coleman MRN: [id number] DOB: 1956-04-25    LOS: 4  [facility]   Primary Pulm:  Philip Coleman @ [facility]  History of Present Illness:  54 y/owm active smoker with PMH of Hodgkin's Disease, Thyroid Disease, Severe COPD, h/o Recurrent spontaneous Pnuemothorax adm 5/10 with lt spon pnthx requiring chest tube. Transiently placed on bipap in ER. Chest tube placed by TCTS Philip Coleman) with immediate relief of dyspnea.     Tests / Events: 5/13 cxr with repeat PTX after water seal 5/13 continued pneumothorax, second chest tube placed  Subjective: Feeling a little better today; had second chest tube placed yesterday   Vital Signs: BP 94/59  Pulse 88  Temp(Src) 99.2 F (37.3 C) (Oral)  Resp 20  Ht 5\' 7"  (1.702 m)  Wt 54.885 kg (121 lb)  BMI 18.95 kg/m2  SpO2 94%   Intake/Output      05/13 0701 - 05/14 0700 05/14 0701 - 05/15 0700   P.O. 1080    Total Intake(mL/kg) 1080 (19.7)    Urine (mL/kg/hr) 1275 (1)    Chest Tube     Total Output 1275    Net -195             Physical Examination: Gen: comfortable, no acute distress HEENT: NCAT, PERRL PULM: Rhonchi on left, cta on R CV; RRR, distant, nl s1/s2 AB: BS+, soft,nontender Ext: no edema   Labs    CBC  Lab 09/17/11 0725 09/16/11 1425  HGB 15.4 17.5*  HCT 45.5 52.0  WBC 13.2* 16.6*  PLT 289 315   BMET  Lab 09/20/11 0520 09/17/11 0725 09/16/11 1425  NA 129* 136 136  K 4.1 4.7 --  CL 89* 97 101  CO2 30 27 24   GLUCOSE 87 122* 136*  BUN 17 12 7   CREATININE 0.80 0.80 0.73  CALCIUM 8.8 9.2 8.9  MG -- 2.3 --  PHOS -- 3.3 --    Lab 09/16/11 1517  PHART 7.345*  PCO2ART 42.6  PO2ART 146.0*  HCO3 23.2  TCO2 25  O2SAT 99.0     Radiology: pcxr 5/11 > No significant change in aeration the lungs compared with prior exam. Stable left chest tube without visible pneumothorax.  CXR 5/13>>>New moderate sized left hydropneumothorax with left chest tube is stable  position.  CXR 5/14 >>> two left chest tubes, no large pneumothorax on left   Assessment and Plan:  Spontaneous Left Pneumothorax Hx of previous L & R PTX with chest tubes.  L CT placed per Dr. Cornelius Coleman on 5/10.  5/13 with recurrent ptx after water seal.   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

## 2011-09-20 NOTE — Progress Notes (Signed)
Superior chest tube removed per MD order and protocol. Pt premedicated prior to removal, see MAR. CT suture located to the far right of chest tube site, unable to approximate site due to location of suture. Occlusive dressing applied. PA made aware and orders received to keep occlusive dressing on site.  Pt tolerated procedure well. Will continue to monitor pt and site.

## 2011-09-20 NOTE — Progress Notes (Signed)
INITIAL ADULT NUTRITION ASSESSMENT Date: 09/20/2011   Time: 11:01 AM  Reason for Assessment: Nutrition Risk Report  ASSESSMENT: Male 56 y.o.  Dx: Pneumothorax, left  Hx:  Past Medical History  Diagnosis Date  . Thyroid disease     hyper, not on meds  . COPD (chronic obstructive pulmonary disease)   . Hodgkin's disease 1993    with abdominal surgical scar -pt unable to describe   . Spontaneous pneumothorax     left  . Spontaneous pneumothorax     right    Related Meds:     . albuterol  2.5 mg Nebulization Q6H  . budesonide-formoterol  2 puff Inhalation BID  . cloNIDine  0.1 mg Oral TID  . folic acid  1 mg Oral Daily  . guaiFENesin  1,200 mg Oral BID  . heparin subcutaneous  5,000 Units Subcutaneous Q8H  . ipratropium  0.5 mg Nebulization Q6H  . magnesium hydroxide  30 mL Oral Daily  . mulitivitamin with minerals  1 tablet Oral Daily  . oxyCODONE  10 mg Oral Q12H  . pantoprazole  20 mg Oral Q1200  . thiamine  100 mg Oral Daily   Or  . thiamine  100 mg Intravenous Daily  . traMADol  100 mg Oral Q6H  . DISCONTD: LORazepam  0-4 mg Oral Q12H    Ht: 5\' 7"  (170.2 cm)  Wt: 121 lb (54.885 kg)  Ideal Wt: 67.2 kg % Ideal Wt: 82%  Usual Wt: unknown % Usual Wt: ---  Body mass index is 18.95 kg/(m^2).  Food/Nutrition Related Hx: problems chewing or swallowing foods and/or liquids per admission nutrition screen  Labs:  CMP     Component Value Date/Time   NA 129* 09/20/2011 0520   K 4.1 09/20/2011 0520   CL 89* 09/20/2011 0520   CO2 30 09/20/2011 0520   GLUCOSE 87 09/20/2011 0520   BUN 17 09/20/2011 0520   CREATININE 0.80 09/20/2011 0520   CALCIUM 8.8 09/20/2011 0520   PROT 8.1 07/31/2011 0335   ALBUMIN 3.4* 07/31/2011 0335   AST 46* 07/31/2011 0335   ALT 35 07/31/2011 0335   ALKPHOS 113 07/31/2011 0335   BILITOT 0.3 07/31/2011 0335   GFRNONAA >90 09/20/2011 0520   GFRAA >90 09/20/2011 0520     Intake/Output Summary (Last 24 hours) at 09/20/11 1123 Last data filed at  09/20/11 0935  Gross per 24 hour  Intake   1200 ml  Output   1375 ml  Net   -175 ml     Diet Order: General  Supplements/Tube Feeding: folic acid, thiamine, MVI daily  IVF: N/A  Estimated Nutritional Needs:   Kcal: 1700-1900 Protein: 80-90 gm Fluid: 1.7-1.9 L  Patient presented with respiratory distress and SOB; s/p chest tube placement 5/10; plan for VATS pleuridesis on 5/15; reports his appetite has been variable; PO intake at 25-50% per flowsheet records; patient with visible subcutaneous fat loss; noted hx of ETOH abuse; no recent weight loss reported; RD suspects some level of malnutrition; no breakfast consumed this AM per tray observation; would like chocolate Ensure supplement -- RD to order.  NUTRITION DIAGNOSIS: -Inadequate oral intake (NI-2.1).  Status: Ongoing  RELATED TO: decreased appetite  AS EVIDENCE BY: PO intake 25-50%  MONITORING/EVALUATION(Goals): Goal: meet >90% of estimated nutrition needs Monitor: PO intake, weight, labs, I/O's  EDUCATION NEEDS: -No education needs identified at this time  INTERVENTION:  Add chocolate Ensure Complete PO BID (350 kcals, 13 gm protein per 8 fl oz bottle)  Continue MVI PO daily  RD to follow for nutrition care plan  Dietitian #: [phone]  DOCUMENTATION CODES Per approved criteria  -Not Applicable    Alger Memos 09/20/2011, 11:01 AM

## 2011-09-20 NOTE — Progress Notes (Addendum)
[address]            [place] [address]          [phone]        Procedure(s) (LRB): VIDEO ASSISTED THORACOSCOPY (VATS) W/TALC PLEUADESIS (Left) Subjective: Feels poorly  Objective  Telemetry sinus rhythm  Temp:  [98.9 F (37.2 C)-99.2 F (37.3 C)] 99.2 F (37.3 C) (05/14 0559) Pulse Rate:  [85-89] 88  (05/14 0559) Resp:  [18-20] 20  (05/14 0559) BP: (94-119)/(59-75) 94/59 mmHg (05/14 0559) SpO2:  [90 %-96 %] 94 % (05/14 0740) Weight:  [121 lb (54.885 kg)] 121 lb (54.885 kg) (05/14 0559)   Intake/Output Summary (Last 24 hours) at 09/20/11 0809 Last data filed at 09/20/11 0400  Gross per 24 hour  Intake   1080 ml  Output   1275 ml  Net   -195 ml       General appearance: alert, cooperative, fatigued and no distress Heart: regular rate and rhythm and S1, S2 normal Lungs: fair air exchange Abdomen: mod distension, nontender+ BS  Lab Results:  Basename 09/20/11 0520  NA 129*  K 4.1  CL 89*  CO2 30  GLUCOSE 87  BUN 17  CREATININE 0.80  CALCIUM 8.8  MG --  PHOS --   No results found for this basename: AST:2,ALT:2,ALKPHOS:2,BILITOT:2,PROT:2,ALBUMIN:2 in the last 72 hours No results found for this basename: LIPASE:2,AMYLASE:2 in the last 72 hours No results found for this basename: WBC:2,NEUTROABS:2,HGB:2,HCT:2,MCV:2,PLT:2 in the last 72 hours No results found for this basename: CKTOTAL:4,CKMB:4,TROPONINI:4 in the last 72 hours No components found with this basename: POCBNP:3 No results found for this basename: DDIMER in the last 72 hours No results found for this basename: HGBA1C in the last 72 hours No results found for this basename: CHOL,HDL,LDLCALC,TRIG,CHOLHDL in the last 72 hours No results found for this basename: TSH,T4TOTAL,FREET3,T3FREE,THYROIDAB in the last 72 hours No results found for this basename: VITAMINB12,FOLATE,FERRITIN,TIBC,IRON,RETICCTPCT in the last 72 hours  Medications: Scheduled    .  albuterol  2.5 mg Nebulization Q6H  . budesonide-formoterol  2 puff Inhalation BID  . cloNIDine  0.1 mg Oral TID  . folic acid  1 mg Oral Daily  . guaiFENesin  1,200 mg Oral BID  . heparin subcutaneous  5,000 Units Subcutaneous Q8H  . ipratropium  0.5 mg Nebulization Q6H  . LORazepam  0-4 mg Oral Q12H  . mulitivitamin with minerals  1 tablet Oral Daily  . oxyCODONE  10 mg Oral Q12H  . pantoprazole  20 mg Oral Q1200  . thiamine  100 mg Oral Daily   Or  . thiamine  100 mg Intravenous Daily  . traMADol  100 mg Oral Q6H     Radiology/Studies:  Dg Chest 2 View  09/19/2011  *RADIOLOGY REPORT*  Clinical Data: Left chest tube.  CHEST - 2 VIEW  Comparison: 09/17/2011  Findings: New moderate-to-large left hydropneumothorax. Atelectasis in the left lung.  Severe COPD changes.  Left chest tube remains in stable position.  IMPRESSION: New moderate sized left hydropneumothorax with left chest tube is stable position.  COPD.  These results were called to the patient's nurse, Tammy at the time of interpretation.  Original Report Authenticated By: Cyndie Chime, M.D.   Dg Chest Port 1 View  09/19/2011  *RADIOLOGY REPORT*  Clinical Data: Chest tube placement  PORTABLE CHEST - 1 VIEW  Comparison: 09/19/2011  Findings: A  second chest tube has been placed on the left with evacuation of most of the left pneumothorax.  Progression of bibasilar airspace disease which may be pneumonia or atelectasis.  Chronic left clavicle fracture.  IMPRESSION: Second chest tube placed on the left with evacuation of most of the left pneumothorax.  Progression of bibasilar airspace disease.  Original Report Authenticated By: Camelia Phenes, M.D.   Dg Chest Port 1 View  09/19/2011  *RADIOLOGY REPORT*  Clinical Data: Left pneumothorax, cough  PORTABLE CHEST - 1 VIEW  Comparison: Portable exam 1540 hours compared to 0559 hours  Findings: Left thoracostomy tube unchanged. Increased right pneumothorax. Mild rotation to the right.  Increased atelectasis of left lower lobe. Underlying emphysematous changes with bullae at right lung base. Slightly increased atelectasis or infiltrate in right lower lobe as well. Heart size stable. Mild atherosclerotic calcification aorta. Surgical clips right upper quadrant. Right apex scarring stable.  IMPRESSION: Severe emphysematous and bullous disease changes with increase in left pneumothorax and increased left lower lobe atelectasis since previous study. Mildly increased infiltrate versus atelectasis in right lower lobe.  Findings called to [facility] on 2000 Unit on 09/19/2011 at 1550 hours.  Original Report Authenticated By: Lollie Marrow, M.D.    INR: Will add last result for INR, ABG once components are confirmed Will add last 4 CBG results once components are confirmed  Assessment/Plan: S/P Procedure(s) (LRB): VIDEO ASSISTED THORACOSCOPY (VATS) W/TALC PLEUADESIS (Left)  1 CXR shows expansion of lung with the 2 tubes 2 plan for VATS Weds.  3 will add MOM for constipation  LOS: 4 days    GOLD,WAYNE E 5/14/20138:09 AM  Chart reviewed, patient examined, agree with above.   Will DC old chest tube since it was non-functional. Plan left VATS Thursday afternoon.

## 2011-09-21 ENCOUNTER — Inpatient Hospital Stay (HOSPITAL_COMMUNITY): Payer: Medicare Other

## 2011-09-21 DIAGNOSIS — E871 Hypo-osmolality and hyponatremia: Secondary | ICD-10-CM

## 2011-09-21 LAB — BASIC METABOLIC PANEL
BUN: 14 mg/dL (ref 6–23)
CO2: 35 mEq/L — ABNORMAL HIGH (ref 19–32)
Chloride: 90 mEq/L — ABNORMAL LOW (ref 96–112)
Creatinine, Ser: 0.82 mg/dL (ref 0.50–1.35)
Glucose, Bld: 91 mg/dL (ref 70–99)
Potassium: 4.5 mEq/L (ref 3.5–5.1)

## 2011-09-21 MED ORDER — IPRATROPIUM BROMIDE 0.02 % IN SOLN
0.5000 mg | Freq: Three times a day (TID) | RESPIRATORY_TRACT | Status: DC
  Administered 2011-09-22: 0.5 mg via RESPIRATORY_TRACT
  Filled 2011-09-21: qty 2.5

## 2011-09-21 MED ORDER — ALBUTEROL SULFATE (5 MG/ML) 0.5% IN NEBU
2.5000 mg | INHALATION_SOLUTION | Freq: Three times a day (TID) | RESPIRATORY_TRACT | Status: DC
  Administered 2011-09-22: 2.5 mg via RESPIRATORY_TRACT
  Filled 2011-09-21: qty 0.5

## 2011-09-21 MED ORDER — DEXTROSE 5 % IV SOLN
1.5000 g | Freq: Once | INTRAVENOUS | Status: AC
  Administered 2011-09-22: 1.5 g via INTRAVENOUS
  Filled 2011-09-21: qty 1.5

## 2011-09-21 NOTE — Progress Notes (Signed)
Name: Philip Coleman MRN: [id number] DOB: 13-Dec-1955    LOS: 5  [place] [facility]   Primary Pulm:  Philip Coleman @ [facility]  History of Present Illness:  47 y/owm active smoker with PMH of Hodgkin's Disease, Thyroid Disease, Severe COPD, h/o Recurrent spontaneous Pnuemothorax adm 5/10 with lt spon pnthx requiring chest tube. Transiently placed on bipap in ER. Chest tube placed by TCTS Philip Coleman) with immediate relief of dyspnea.     Tests / Events: 5/13 cxr with repeat PTX after water seal 5/13 continued pneumothorax, second chest tube placed  Subjective: Chest pain this morning, mild   Vital Signs: BP 97/62  Pulse 73  Temp(Src) 98.4 F (36.9 C) (Oral)  Resp 19  Ht 5\' 7"  (1.702 m)  Wt 54.1 kg (119 lb 4.3 oz)  BMI 18.68 kg/m2  SpO2 94%   Intake/Output      05/14 0701 - 05/15 0700 05/15 0701 - 05/16 0700   P.O. 1200    Total Intake(mL/kg) 1200 (22.2)    Urine (mL/kg/hr) 1600 (1.2) 300   Total Output 1600 300   Net -400 -300        Stool Occurrence 1 x        Physical Examination: Gen: comfortable, no acute distress HEENT: NCAT, PERRL PULM: diminished L>R CV; RRR, distant, nl s1/s2 AB: BS+, soft,nontender Ext: no edema   Labs    CBC  Lab 09/17/11 0725 09/16/11 1425  HGB 15.4 17.5*  HCT 45.5 52.0  WBC 13.2* 16.6*  PLT 289 315   BMET  Lab 09/21/11 0525 09/20/11 0520 09/17/11 0725 09/16/11 1425  NA 131* 129* 136 136  K 4.5 4.1 -- --  CL 90* 89* 97 101  CO2 35* 30 27 24   GLUCOSE 91 87 122* 136*  BUN 14 17 12 7   CREATININE 0.82 0.80 0.80 0.73  CALCIUM 9.1 8.8 9.2 8.9  MG -- -- 2.3 --  PHOS -- -- 3.3 --    Lab 09/16/11 1517  PHART 7.345*  PCO2ART 42.6  PO2ART 146.0*  HCO3 23.2  TCO2 25  O2SAT 99.0     Radiology: pcxr 5/11 > No significant change in aeration the lungs compared with prior exam. Stable left chest tube without visible pneumothorax.  CXR 5/13>>>New moderate sized left hydropneumothorax with left  chest tube is stable position.  CXR 5/14 >>> two left chest tubes, no large pneumothorax on left  CXR 5/15 >>> pending  Assessment and Plan:  Spontaneous Left Pneumothorax with bullous emphysema Hx of previous L & R PTX with chest tubes.  L CT placed per Dr. Cornelius Coleman on 5/10.  5/13 with recurrent ptx after water seal.   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

## 2011-09-21 NOTE — Progress Notes (Addendum)
Procedure(s) (LRB): VIDEO ASSISTED THORACOSCOPY (VATS) W/TALC PLEUADESIS (Left) Subjective:  Philip Coleman complains of pain at chest tube site.  He is taking his pain medication regularly  Objective: Vital signs in last 24 hours: Temp:  [98.4 F (36.9 C)-99.5 F (37.5 C)] 98.4 F (36.9 C) (05/15 0507) Pulse Rate:  [73-82] 73  (05/15 0507) Cardiac Rhythm:  [-] Normal sinus rhythm (05/15 0700) Resp:  [19-20] 19  (05/15 0507) BP: (97-111)/(62-74) 97/62 mmHg (05/15 0507) SpO2:  [81 %-96 %] 94 % (05/14 2011) Weight:  [119 lb 4.3 oz (54.1 kg)] 119 lb 4.3 oz (54.1 kg) (05/15 0507)   Intake/Output from previous day: 05/14 0701 - 05/15 0700 In: 1200 [P.O.:1200] Out: 1600 [Urine:1600] Intake/Output this shift: Total I/O In: -  Out: 300 [Urine:300]  General appearance: alert, cooperative and no distress Heart: regular rate and rhythm, S1, S2 normal, no murmur, click, rub or gallop Lungs: clear to auscultation bilaterally Abdomen: soft, non-tender; bowel sounds normal; no masses,  no organomegaly Wound: clean and dry   Lab Results: No results found for this basename: WBC:2,HGB:2,HCT:2,PLT:2 in the last 72 hours BMET:  [facility] 09/21/11 0525 09/20/11 0520  NA 131* 129*  K 4.5 4.1  CL 90* 89*  CO2 35* 30  GLUCOSE 91 87  BUN 14 17  CREATININE 0.82 0.80  CALCIUM 9.1 8.8    PT/INR: No results found for this basename: LABPROT,INR in the last 72 hours ABG    Component Value Date/Time   PHART 7.345* 09/16/2011 1517   HCO3 23.2 09/16/2011 1517   TCO2 25 09/16/2011 1517   ACIDBASEDEF 3.0* 09/16/2011 1517   O2SAT 99.0 09/16/2011 1517   CBG (last 3)  No results found for this basename: GLUCAP:3 in the last 72 hours  Assessment/Plan:  1. S/P Chest tube placement for pneumothorax- no air leak appreciated 2. Pain control- currently on Oxy, Morphine, and Tramadol 3. NPO at midnight 4. OR tomorrow for VATS with stapling of blebs and pleurodesis    LOS: 5 days    Philip Coleman 09/21/2011   Chart reviewed, patient examined, agree with above. CXR looks ok without ptx.  There is no active air leak now.  I think it is best to proceed with left VATS tomorrow to prevent recurrence of this problem. Patient is in agreement. I discussed the alternative of managing this without surgery, benefits and risks of surgery, and he agrees to proceed.

## 2011-09-22 ENCOUNTER — Encounter (HOSPITAL_COMMUNITY): Payer: Self-pay | Admitting: Anesthesiology

## 2011-09-22 ENCOUNTER — Encounter (HOSPITAL_COMMUNITY)
Admission: EM | Disposition: A | Discharge status: Home / Self Care | Payer: Self-pay | Source: Home / Self Care | Attending: Pulmonary Disease

## 2011-09-22 ENCOUNTER — Inpatient Hospital Stay (HOSPITAL_COMMUNITY): Payer: Medicare Other | Admitting: Anesthesiology

## 2011-09-22 ENCOUNTER — Inpatient Hospital Stay (HOSPITAL_COMMUNITY): Payer: Medicare Other

## 2011-09-22 ENCOUNTER — Encounter (HOSPITAL_COMMUNITY): Payer: Self-pay | Admitting: Pulmonary Disease

## 2011-09-22 DIAGNOSIS — J930 Spontaneous tension pneumothorax: Secondary | ICD-10-CM

## 2011-09-22 LAB — BASIC METABOLIC PANEL
BUN: 14 mg/dL (ref 6–23)
CO2: 33 mEq/L — ABNORMAL HIGH (ref 19–32)
Calcium: 9.4 mg/dL (ref 8.4–10.5)
Chloride: 90 mEq/L — ABNORMAL LOW (ref 96–112)
Creatinine, Ser: 0.79 mg/dL (ref 0.50–1.35)
Glucose, Bld: 96 mg/dL (ref 70–99)

## 2011-09-22 SURGERY — VIDEO ASSISTED THORACOSCOPY (VATS)/THOROCOTOMY
Anesthesia: General | Site: Chest | Laterality: Left | Wound class: Clean Contaminated

## 2011-09-22 MED ORDER — ACETAMINOPHEN 10 MG/ML IV SOLN
1000.0000 mg | Freq: Four times a day (QID) | INTRAVENOUS | Status: AC
  Administered 2011-09-22 – 2011-09-23 (×4): 1000 mg via INTRAVENOUS
  Filled 2011-09-22 (×4): qty 100

## 2011-09-22 MED ORDER — ONDANSETRON HCL 4 MG/2ML IJ SOLN
4.0000 mg | Freq: Four times a day (QID) | INTRAMUSCULAR | Status: DC | PRN
  Administered 2011-09-23: 4 mg via INTRAVENOUS
  Filled 2011-09-22 (×2): qty 2

## 2011-09-22 MED ORDER — SODIUM CHLORIDE 0.9 % IJ SOLN
9.0000 mL | INTRAMUSCULAR | Status: DC | PRN
  Administered 2011-09-24: 9 mL via INTRAVENOUS

## 2011-09-22 MED ORDER — TRAMADOL HCL 50 MG PO TABS: 100.0000 mg | ORAL_TABLET | Freq: Four times a day (QID) | ORAL | Status: DC | PRN

## 2011-09-22 MED ORDER — PROPOFOL 10 MG/ML IV EMUL
INTRAVENOUS | Status: DC | PRN
  Administered 2011-09-22: 100 mg via INTRAVENOUS

## 2011-09-22 MED ORDER — NEOSTIGMINE METHYLSULFATE 1 MG/ML IJ SOLN
INTRAMUSCULAR | Status: DC | PRN
  Administered 2011-09-22: 3 mg via INTRAVENOUS

## 2011-09-22 MED ORDER — 0.9 % SODIUM CHLORIDE (POUR BTL) OPTIME
TOPICAL | Status: DC | PRN
  Administered 2011-09-22: 2000 mL

## 2011-09-22 MED ORDER — ROCURONIUM BROMIDE 100 MG/10ML IV SOLN
INTRAVENOUS | Status: DC | PRN
  Administered 2011-09-22: 50 mg via INTRAVENOUS

## 2011-09-22 MED ORDER — NALOXONE HCL 0.4 MG/ML IJ SOLN: 0.4000 mg | INTRAMUSCULAR | Status: DC | PRN

## 2011-09-22 MED ORDER — FENTANYL CITRATE 0.05 MG/ML IJ SOLN
INTRAMUSCULAR | Status: DC | PRN
  Administered 2011-09-22 (×5): 50 ug via INTRAVENOUS
  Administered 2011-09-22: 100 ug via INTRAVENOUS

## 2011-09-22 MED ORDER — DEXTROSE 5 % IV SOLN
1.5000 g | Freq: Two times a day (BID) | INTRAVENOUS | Status: AC
  Administered 2011-09-22 – 2011-09-23 (×2): 1.5 g via INTRAVENOUS
  Filled 2011-09-22 (×2): qty 1.5

## 2011-09-22 MED ORDER — INSULIN ASPART 100 UNIT/ML ~~LOC~~ SOLN
0.0000 [IU] | Freq: Four times a day (QID) | SUBCUTANEOUS | Status: DC
  Administered 2011-09-23: 2 [IU] via SUBCUTANEOUS

## 2011-09-22 MED ORDER — POTASSIUM CHLORIDE 10 MEQ/50ML IV SOLN
10.0000 meq | Freq: Every day | INTRAVENOUS | Status: DC | PRN
  Administered 2011-09-26 (×3): 10 meq via INTRAVENOUS
  Filled 2011-09-22: qty 150

## 2011-09-22 MED ORDER — SENNOSIDES-DOCUSATE SODIUM 8.6-50 MG PO TABS
1.0000 | ORAL_TABLET | Freq: Every evening | ORAL | Status: DC | PRN
  Filled 2011-09-22: qty 1

## 2011-09-22 MED ORDER — LUNG SURGERY BOOK
Freq: Once | Status: AC
  Administered 2011-09-22: 21:00:00
  Filled 2011-09-22 (×2): qty 1

## 2011-09-22 MED ORDER — GLYCOPYRROLATE 0.2 MG/ML IJ SOLN
INTRAMUSCULAR | Status: DC | PRN
  Administered 2011-09-22: 0.4 mg via INTRAVENOUS

## 2011-09-22 MED ORDER — HYDROMORPHONE HCL PF 1 MG/ML IJ SOLN
INTRAMUSCULAR | Status: AC
  Filled 2011-09-22: qty 1

## 2011-09-22 MED ORDER — VECURONIUM BROMIDE 10 MG IV SOLR
INTRAVENOUS | Status: DC | PRN
  Administered 2011-09-22: 1 mg via INTRAVENOUS
  Administered 2011-09-22: 4 mg via INTRAVENOUS

## 2011-09-22 MED ORDER — KCL IN DEXTROSE-NACL 20-5-0.45 MEQ/L-%-% IV SOLN
INTRAVENOUS | Status: DC
  Administered 2011-09-22: 18:00:00 via INTRAVENOUS
  Administered 2011-09-23 (×2): 20 mL via INTRAVENOUS
  Administered 2011-09-25: 03:00:00 via INTRAVENOUS
  Filled 2011-09-22 (×4): qty 1000

## 2011-09-22 MED ORDER — HYDROMORPHONE 0.3 MG/ML IV SOLN
INTRAVENOUS | Status: DC
  Administered 2011-09-22: 2.1 mg via INTRAVENOUS
  Administered 2011-09-22: 17:00:00 via INTRAVENOUS
  Administered 2011-09-23: 2.4 mg via INTRAVENOUS
  Administered 2011-09-23: 09:00:00 via INTRAVENOUS
  Administered 2011-09-23: 1.8 mg via INTRAVENOUS
  Administered 2011-09-23 (×2): via INTRAVENOUS
  Administered 2011-09-23: 6.35 mg via INTRAVENOUS
  Administered 2011-09-23: 2.4 mg via INTRAVENOUS
  Administered 2011-09-23: 3.84 mg via INTRAVENOUS
  Administered 2011-09-23: 4.8 mg via INTRAVENOUS
  Administered 2011-09-24: 1.5 mg via INTRAVENOUS
  Administered 2011-09-24: 0.9 mg via INTRAVENOUS
  Administered 2011-09-24 (×2): 0.6 mg via INTRAVENOUS
  Filled 2011-09-22 (×3): qty 25

## 2011-09-22 MED ORDER — ONDANSETRON HCL 4 MG/2ML IJ SOLN
INTRAMUSCULAR | Status: DC | PRN
  Administered 2011-09-22: 4 mg via INTRAVENOUS

## 2011-09-22 MED ORDER — MIDAZOLAM HCL 5 MG/5ML IJ SOLN
INTRAMUSCULAR | Status: DC | PRN
  Administered 2011-09-22: 2 mg via INTRAVENOUS

## 2011-09-22 MED ORDER — DIPHENHYDRAMINE HCL 12.5 MG/5ML PO ELIX
12.5000 mg | ORAL_SOLUTION | Freq: Four times a day (QID) | ORAL | Status: DC | PRN
  Filled 2011-09-22: qty 5

## 2011-09-22 MED ORDER — BISACODYL 5 MG PO TBEC
10.0000 mg | DELAYED_RELEASE_TABLET | Freq: Every day | ORAL | Status: DC
  Administered 2011-09-22 – 2011-09-25 (×4): 10 mg via ORAL
  Filled 2011-09-22 (×3): qty 2
  Filled 2011-09-22: qty 1

## 2011-09-22 MED ORDER — HYDROMORPHONE HCL PF 1 MG/ML IJ SOLN
0.2500 mg | INTRAMUSCULAR | Status: DC | PRN
  Administered 2011-09-22 (×2): 0.5 mg via INTRAVENOUS

## 2011-09-22 MED ORDER — MIDAZOLAM HCL 2 MG/2ML IJ SOLN: 2.0000 mg | INTRAMUSCULAR | Status: DC | PRN

## 2011-09-22 MED ORDER — ALBUTEROL SULFATE (5 MG/ML) 0.5% IN NEBU
2.5000 mg | INHALATION_SOLUTION | RESPIRATORY_TRACT | Status: DC
  Administered 2011-09-22 – 2011-09-25 (×13): 2.5 mg via RESPIRATORY_TRACT
  Filled 2011-09-22 (×13): qty 0.5

## 2011-09-22 MED ORDER — KETOROLAC TROMETHAMINE 15 MG/ML IJ SOLN
15.0000 mg | Freq: Four times a day (QID) | INTRAMUSCULAR | Status: DC | PRN
  Administered 2011-09-24 (×2): 15 mg via INTRAVENOUS
  Filled 2011-09-22 (×2): qty 1

## 2011-09-22 MED ORDER — LACTATED RINGERS IV SOLN
INTRAVENOUS | Status: DC | PRN
  Administered 2011-09-22: 14:00:00 via INTRAVENOUS

## 2011-09-22 MED ORDER — ONDANSETRON HCL 4 MG/2ML IJ SOLN
4.0000 mg | Freq: Four times a day (QID) | INTRAMUSCULAR | Status: DC | PRN
  Administered 2011-09-24: 4 mg via INTRAVENOUS

## 2011-09-22 MED ORDER — FENTANYL CITRATE 0.05 MG/ML IJ SOLN: 50.0000 ug | INTRAMUSCULAR | Status: DC | PRN

## 2011-09-22 MED ORDER — DIPHENHYDRAMINE HCL 50 MG/ML IJ SOLN: 12.5000 mg | Freq: Four times a day (QID) | INTRAMUSCULAR | Status: DC | PRN

## 2011-09-22 SURGICAL SUPPLY — 65 items
CANISTER SUCTION 2500CC (MISCELLANEOUS) ×2 IMPLANT
CATH KIT ON Q 5IN SLV (PAIN MANAGEMENT) IMPLANT
CATH ROBINSON RED A/P 22FR (CATHETERS) ×1 IMPLANT
CATH THORACIC 28FR (CATHETERS) ×1 IMPLANT
CATH THORACIC 36FR (CATHETERS) IMPLANT
CATH THORACIC 36FR RT ANG (CATHETERS) IMPLANT
CLEANER TIP ELECTROSURG 2X2 (MISCELLANEOUS) ×1 IMPLANT
CLOTH BEACON ORANGE TIMEOUT ST (SAFETY) ×2 IMPLANT
CONT SPEC 4OZ CLIKSEAL STRL BL (MISCELLANEOUS) ×4 IMPLANT
DRAPE CAMERA CLOSED 9X96 (DRAPES) IMPLANT
DRAPE LAPAROSCOPIC ABDOMINAL (DRAPES) ×2 IMPLANT
DRAPE SLUSH MACHINE 52X66 (DRAPES) ×1 IMPLANT
DRAPE SLUSH/WARMER DISC (DRAPES) IMPLANT
ELECT REM PT RETURN 9FT ADLT (ELECTROSURGICAL) ×2
ELECTRODE REM PT RTRN 9FT ADLT (ELECTROSURGICAL) ×1 IMPLANT
GLOVE BIO SURGEON STRL SZ7.5 (GLOVE) ×1 IMPLANT
GLOVE EUDERMIC 7 POWDERFREE (GLOVE) ×4 IMPLANT
GLOVE SURG SS PI 7.0 STRL IVOR (GLOVE) ×2 IMPLANT
GOWN PREVENTION PLUS XLARGE (GOWN DISPOSABLE) ×4 IMPLANT
GOWN STRL NON-REIN LRG LVL3 (GOWN DISPOSABLE) ×6 IMPLANT
HANDLE STAPLE ENDO GIA SHORT (STAPLE) ×1
KIT BASIN OR (CUSTOM PROCEDURE TRAY) ×2 IMPLANT
KIT ROOM TURNOVER OR (KITS) ×2 IMPLANT
KIT SUCTION CATH 14FR (SUCTIONS) ×1 IMPLANT
NS IRRIG 1000ML POUR BTL (IV SOLUTION) ×4 IMPLANT
PACK CHEST (CUSTOM PROCEDURE TRAY) ×2 IMPLANT
PAD ARMBOARD 7.5X6 YLW CONV (MISCELLANEOUS) ×4 IMPLANT
RELOAD EGIA 60 MED/THCK PURPLE (STAPLE) ×2 IMPLANT
RELOAD STAPLE 60 MED/THCK ART (STAPLE) IMPLANT
SEALANT SURG COSEAL 4ML (VASCULAR PRODUCTS) IMPLANT
SEALANT SURG COSEAL 8ML (VASCULAR PRODUCTS) IMPLANT
SOLUTION ANTI FOG 6CC (MISCELLANEOUS) ×2 IMPLANT
SPONGE GAUZE 4X4 12PLY (GAUZE/BANDAGES/DRESSINGS) ×2 IMPLANT
STAPLER ENDO GIA 12 SHRT THIN (STAPLE) IMPLANT
STAPLER ENDO GIA 12MM SHORT (STAPLE) ×1 IMPLANT
SUT PROLENE 3 0 SH DA (SUTURE) IMPLANT
SUT PROLENE 4 0 RB 1 (SUTURE)
SUT PROLENE 4-0 RB1 .5 CRCL 36 (SUTURE) IMPLANT
SUT SILK  1 MH (SUTURE) ×2
SUT SILK 1 MH (SUTURE) ×2 IMPLANT
SUT SILK 2 0SH CR/8 30 (SUTURE) ×1 IMPLANT
SUT SILK 3 0SH CR/8 30 (SUTURE) IMPLANT
SUT VIC AB 1 CTX 36 (SUTURE) ×2
SUT VIC AB 1 CTX36XBRD ANBCTR (SUTURE) IMPLANT
SUT VIC AB 2-0 CT1 27 (SUTURE) ×2
SUT VIC AB 2-0 CT1 TAPERPNT 27 (SUTURE) IMPLANT
SUT VIC AB 2-0 CTX 36 (SUTURE) IMPLANT
SUT VIC AB 2-0 UR6 27 (SUTURE) ×1 IMPLANT
SUT VIC AB 3-0 X1 27 (SUTURE) ×1 IMPLANT
SUT VICRYL 2 TP 1 (SUTURE) ×1 IMPLANT
SWAB COLLECTION DEVICE MRSA (MISCELLANEOUS) IMPLANT
SYR BULB IRRIGATION 50ML (SYRINGE) ×2 IMPLANT
SYSTEM SAHARA CHEST DRAIN ATS (WOUND CARE) ×2 IMPLANT
TAPE CLOTH 4X10 WHT NS (GAUZE/BANDAGES/DRESSINGS) ×2 IMPLANT
TAPE CLOTH SURG 4X10 WHT LF (GAUZE/BANDAGES/DRESSINGS) ×2 IMPLANT
TIP APPLICATOR SPRAY EXTEND 16 (VASCULAR PRODUCTS) IMPLANT
TOWEL OR 17X24 6PK STRL BLUE (TOWEL DISPOSABLE) ×2 IMPLANT
TOWEL OR 17X26 10 PK STRL BLUE (TOWEL DISPOSABLE) ×4 IMPLANT
TRAP SPECIMEN MUCOUS 40CC (MISCELLANEOUS) IMPLANT
TRAY FOLEY CATH 14FRSI W/METER (CATHETERS) ×2 IMPLANT
TUBE ANAEROBIC SPECIMEN COL (MISCELLANEOUS) IMPLANT
TUBE CONNECTING 12X1/4 (SUCTIONS) ×1 IMPLANT
TUNNELER SHEATH ON-Q 11GX8 (MISCELLANEOUS) IMPLANT
WATER STERILE IRR 1000ML POUR (IV SOLUTION) ×4 IMPLANT
YANKAUER SUCT BULB TIP NO VENT (SUCTIONS) ×1 IMPLANT

## 2011-09-22 NOTE — Progress Notes (Signed)
Name: Philip Coleman MRN: [id number] DOB: May 12, 1955    LOS: 6  [facility]   Primary Pulm:  Christina Billinger @ [facility]  History of Present Illness:  47 y/owm active smoker with PMH of Hodgkin's Disease, Thyroid Disease, Severe COPD, h/o Recurrent spontaneous Pnuemothorax adm 5/10 with lt spon pnthx requiring chest tube. Transiently placed on bipap in ER. Chest tube placed by TCTS [name]) with immediate relief of dyspnea.     Tests / Events: 5/13 cxr with repeat PTX after water seal 5/13 continued pneumothorax, second chest tube placed  Subjective: Sitting up, mild R chest discomfort   Vital Signs: BP 104/69  Pulse 72  Temp(Src) 97.8 F (36.6 C) (Oral)  Resp 19  Ht 5\' 7"  (1.702 m)  Wt 54.1 kg (119 lb 4.3 oz)  BMI 18.68 kg/m2  SpO2 91%  4L [place] Intake/Output      05/15 0701 - 05/16 0700 05/16 0701 - 05/17 0700   P.O. 600    Total Intake(mL/kg) 600 (11.1)    Urine (mL/kg/hr) 1150 (0.9)    Total Output 1150    Net -550             Physical Examination: Gen: comfortable, no acute distress HEENT: NCAT, PERRL PULM: crackles in bases bilaterally, air movement OK CV; RRR, distant, nl s1/s2 AB: BS+, soft,nontender Ext: no edema   Labs    CBC  Lab 09/17/11 0725 09/16/11 1425  HGB 15.4 17.5*  HCT 45.5 52.0  WBC 13.2* 16.6*  PLT 289 315   BMET  Lab 09/22/11 0530 09/21/11 0525 09/20/11 0520 09/17/11 0725 09/16/11 1425  NA 130* 131* 129* 136 136  K 4.8 4.5 -- -- --  CL 90* 90* 89* 97 101  CO2 33* 35* 30 27 24   GLUCOSE 96 91 87 122* 136*  BUN 14 14 17 12 7   CREATININE 0.79 0.82 0.80 0.80 0.73  CALCIUM 9.4 9.1 8.8 9.2 8.9  MG -- -- -- 2.3 --  PHOS -- -- -- 3.3 --    Lab 09/16/11 1517  PHART 7.345*  PCO2ART 42.6  PO2ART 146.0*  HCO3 23.2  TCO2 25  O2SAT 99.0     Radiology: pcxr 5/11 >>> No significant change in aeration the lungs compared with prior exam. Stable left chest tube without visible pneumothorax.  CXR  5/13>>>New moderate sized left hydropneumothorax with left chest tube is stable position.  CXR 5/14 >>> two left chest tubes, no large pneumothorax on left  CXR 5/15 >>> no pneumothorax, one left chest tube  5/14 EKG>> Sinus tach, non specific ST wave changes  Assessment and Plan:  Spontaneous Left Pneumothorax with bullous emphysema Hx of previous L & R PTX with chest tubes.  L CT placed per Dr. [name] on 5/10.  5/13 with recurrent ptx after water seal.  Second CT placed by CT surgery  Plan: -L CT x1 per CT surgery -VATS pleuridesis scheduled for 11:30 today   COPD (chronic obstructive pulmonary disease)  Active smoking PTA, no exacerbation in house Plan: -continue symbicort -duonebs q 6h  -will need outpatient f/u with [facility] post discharge (no prior [place] pulmonologist)  ETOH abuse  Consumes 1-10 beers per day Plan: -no evidence of withdrawal -CIWA off -MVI / Folate / Thiamine  Pain control issues Daily complaints of R chest pain but no objective evidence of ischaemia or pneumothorax, on adequate DVT prophylaxis to PE less likely; suspect musculoskeletal chronic pain Plan: -oxycodone Q12, tramadol Q6 (changed  to prn) -PRN morphine  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

## 2011-09-22 NOTE — Brief Op Note (Signed)
09/16/2011 - 09/22/2011  4:04 PM  PATIENT:  Philippa Chester  56 y.o. male  PRE-OPERATIVE DIAGNOSIS:   Recurrent Spontaneous (L) PNEUMOTHORAX  POST-OPERATIVE DIAGNOSIS: same  PROCEDURE:  Procedure(s) (LRB): VIDEO ASSISTED THORACOSCOPY (VATS)/THOROCOTOMY (Left) Left mini-thoracotomy, stapling of lingular blebs, mechanical pleurodesis.  SURGEON:  Surgeon(s) and Role:    * Alleen Borne, MD - Primary  PHYSICIAN ASSISTANT: Gershon Crane, PA-C    ANESTHESIA:   general  EBL:  Total I/O In: 1500 [I.V.:1500] Out: 500 [Urine:400; Blood:100]  BLOOD ADMINISTERED:none  DRAINS: 1 Chest Tube(s) in the left pleural space.   LOCAL MEDICATIONS USED:  NONE  SPECIMEN:  Left upper lobe blebs  DISPOSITION OF SPECIMEN:  PATHOLOGY  COUNTS:  YES  PLAN OF CARE: Admit to inpatient   PATIENT DISPOSITION:  PACU - hemodynamically stable.   Delay start of Pharmacological VTE agent (>24hrs) due to surgical blood loss or risk of bleeding: yes

## 2011-09-22 NOTE — Anesthesia Postprocedure Evaluation (Signed)
  Anesthesia Post-op Note  Patient: Philip Coleman  Procedure(s) Performed: Procedure(s) (LRB): VIDEO ASSISTED THORACOSCOPY (VATS)/THOROCOTOMY (Left)  Patient Location: PACU  Anesthesia Type: General  Level of Consciousness: awake  Airway and Oxygen Therapy: Patient Spontanous Breathing and Patient connected to face mask oxygen  Post-op Pain: none  Post-op Assessment: Post-op Vital signs reviewed, Patient's Cardiovascular Status Stable, Respiratory Function Stable and Patent Airway  Post-op Vital Signs: Reviewed and stable  Complications: No apparent anesthesia complications

## 2011-09-22 NOTE — Progress Notes (Signed)
Dr Sampson Goon aware of post op chest X ray results and that the pt has subcutaneous air post lateral left chest.

## 2011-09-22 NOTE — Anesthesia Preprocedure Evaluation (Signed)
Anesthesia Evaluation  Patient identified by MRN, date of birth, ID band Patient awake    Reviewed: Allergy & Precautions, H&P , NPO status , Patient's Chart, lab work & pertinent test results  History of Anesthesia Complications (+) AWARENESS UNDER ANESTHESIA  Airway Mallampati: II TM Distance: >3 FB Neck ROM: Full    Dental No notable dental hx. (+) Edentulous Upper and Edentulous Lower   Pulmonary COPD COPD inhaler,  breath sounds clear to auscultation  Pulmonary exam normal       Cardiovascular negative cardio ROS  Rhythm:Regular Rate:Normal     Neuro/Psych negative neurological ROS  negative psych ROS   GI/Hepatic negative GI ROS, Neg liver ROS,   Endo/Other  negative endocrine ROS  Renal/GU negative Renal ROS  negative genitourinary   Musculoskeletal   Abdominal   Peds  Hematology negative hematology ROS (+)   Anesthesia Other Findings   Reproductive/Obstetrics negative OB ROS                           Anesthesia Physical Anesthesia Plan  ASA: III  Anesthesia Plan: General   Post-op Pain Management:    Induction: Intravenous  Airway Management Planned: Double Lumen EBT  Additional Equipment: Arterial line, CVP and Ultrasound Guidance Line Placement  Intra-op Plan:   Post-operative Plan: Extubation in OR  Informed Consent: I have reviewed the patients History and Physical, chart, labs and discussed the procedure including the risks, benefits and alternatives for the proposed anesthesia with the patient or authorized representative who has indicated his/her understanding and acceptance.     Plan Discussed with: CRNA  Anesthesia Plan Comments:         Anesthesia Quick Evaluation

## 2011-09-22 NOTE — Anesthesia Postprocedure Evaluation (Signed)
  Anesthesia Post-op Note  Patient: Philip Coleman  Procedure(s) Performed: Procedure(s) (LRB): VIDEO ASSISTED THORACOSCOPY (VATS)/THOROCOTOMY (Left)  Patient Location: PACU  Anesthesia Type: General  Level of Consciousness: awake  Airway and Oxygen Therapy: Patient Spontanous Breathing  Post-op Pain: mild  Post-op Assessment: Post-op Vital signs reviewed, Patient's Cardiovascular Status Stable, Respiratory Function Stable, Patent Airway, No signs of Nausea or vomiting and Pain level controlled  Post-op Vital Signs: stable  Complications: No apparent anesthesia complications

## 2011-09-22 NOTE — Transfer of Care (Signed)
Immediate Anesthesia Transfer of Care Note  Patient: Philip Coleman  Procedure(s) Performed: Procedure(s) (LRB): VIDEO ASSISTED THORACOSCOPY (VATS)/THOROCOTOMY (Left)  Patient Location: PACU  Anesthesia Type: General  Level of Consciousness: awake, alert  and oriented  Airway & Oxygen Therapy: Patient Spontanous Breathing and Patient connected to nasal cannula oxygen  Post-op Assessment: Report given to PACU RN and Post -op Vital signs reviewed and stable  Post vital signs: Reviewed and stable  Complications: No apparent anesthesia complications

## 2011-09-22 NOTE — Anesthesia Procedure Notes (Signed)
Procedure Name: Intubation Date/Time: 09/22/2011 2:20 PM Performed by: Arlice Colt B Pre-anesthesia Checklist: Patient identified, Emergency Drugs available, Suction available, Patient being monitored and Timeout performed Patient Re-evaluated:Patient Re-evaluated prior to inductionOxygen Delivery Method: Circle system utilized Preoxygenation: Pre-oxygenation with 100% oxygen Intubation Type: IV induction Ventilation: Mask ventilation without difficulty Laryngoscope Size: Mac and 3 Grade View: Grade I Nasal Tubes: Left Endobronchial tube: Double lumen EBT and 37 Fr Number of attempts: 1 Airway Equipment and Method: Stylet Tube secured with: Tape Dental Injury: Teeth and Oropharynx as per pre-operative assessment

## 2011-09-23 ENCOUNTER — Inpatient Hospital Stay (HOSPITAL_COMMUNITY): Payer: Medicare Other

## 2011-09-23 LAB — CBC
MCH: 29.3 pg (ref 26.0–34.0)
MCHC: 33 g/dL (ref 30.0–36.0)
MCV: 88.6 fL (ref 78.0–100.0)
Platelets: 266 10*3/uL (ref 150–400)
RDW: 14.4 % (ref 11.5–15.5)

## 2011-09-23 LAB — GLUCOSE, CAPILLARY
Glucose-Capillary: 102 mg/dL — ABNORMAL HIGH (ref 70–99)
Glucose-Capillary: 106 mg/dL — ABNORMAL HIGH (ref 70–99)
Glucose-Capillary: 105 mg/dL — ABNORMAL HIGH (ref 70–99)
Glucose-Capillary: 132 mg/dL — ABNORMAL HIGH (ref 70–99)

## 2011-09-23 LAB — BASIC METABOLIC PANEL
BUN: 12 mg/dL (ref 6–23)
Calcium: 8.7 mg/dL (ref 8.4–10.5)
GFR calc non Af Amer: >90 mL/min (ref >90)
Glucose, Bld: 96 mg/dL (ref 70–99)
Potassium: 5 mEq/L (ref 3.5–5.1)

## 2011-09-23 LAB — BLOOD GAS, ARTERIAL
Acid-Base Excess: 2.5 mmol/L — ABNORMAL HIGH (ref 0.0–2.0)
Patient temperature: 98.3
TCO2: 29.9 mmol/L (ref 0–100)

## 2011-09-23 NOTE — Progress Notes (Signed)
Name: Philip Coleman MRN: [id number] DOB: Nov 24, 1955    LOS: 7  [facility]   Primary Pulm:  Christina Billinger @ [facility]  History of Present Illness:  64 y/owm active smoker with PMH of Hodgkin's Disease, Thyroid Disease, Severe COPD, h/o Recurrent spontaneous Pnuemothorax adm 5/10 with lt spon pnthx requiring chest tube. Transiently placed on bipap in ER. Chest tube placed by TCTS [name]) with immediate relief of dyspnea.     Tests / Events: 5/13 cxr with repeat PTX after water seal 5/13 continued pneumothorax, second chest tube placed 5/16 VATS pleurodesis  Subjective:    Vital Signs: BP 90/56  Pulse 73  Temp(Src) 98.4 F (36.9 C) (Oral)  Resp 15  Ht 5\' 7"  (1.702 m)  Wt 55.5 kg (122 lb 5.7 oz)  BMI 19.16 kg/m2  SpO2 97%  4L [place] Intake/Output      05/16 0701 - 05/17 0700 05/17 0701 - 05/18 0700   P.O. 0    I.V. (mL/kg) 2050 (36.9)    IV Piggyback 250    Total Intake(mL/kg) 2300 (41.4)    Urine (mL/kg/hr) 1100 (0.8) 575   Blood 100    Chest Tube 130    Total Output 1330 575   Net +970 -575            Physical Examination: Gen: comfortable, no acute distress HEENT: NCAT, PERRL PULM: crackles in bases bilaterally, air movement OK CV; RRR, distant, nl s1/s2 AB: BS+, soft,nontender Ext: no edema   Labs    CBC  Lab 09/23/11 0345 09/17/11 0725 09/16/11 1425  HGB 13.4 15.4 17.5*  HCT 40.6 45.5 52.0  WBC 13.3* 13.2* 16.6*  PLT 266 289 315   BMET  Lab 09/23/11 0345 09/22/11 0530 09/21/11 0525 09/20/11 0520 09/17/11 0725  NA 128* 130* 131* 129* 136  K 5.0 4.8 -- -- --  CL 91* 90* 90* 89* 97  CO2 29 33* 35* 30 27  GLUCOSE 96 96 91 87 122*  BUN 12 14 14 17 12   CREATININE 0.69 0.79 0.82 0.80 0.80  CALCIUM 8.7 9.4 9.1 8.8 9.2  MG -- -- -- -- 2.3  PHOS -- -- -- -- 3.3    Lab 09/23/11 0417 09/16/11 1517  PHART 7.314* 7.345*  PCO2ART 56.9* 42.6  PO2ART 79.4* 146.0*  HCO3 28.1* 23.2  TCO2 29.9 25  O2SAT 94.4 99.0      Radiology: pCXR 5/11 >>> No significant change in aeration the lungs compared with prior exam. Stable left chest tube without visible pneumothorax.  CXR 5/13>>>New moderate sized left hydropneumothorax with left chest tube is stable position.  CXR 5/14 >>> two left chest tubes, no large pneumothorax on left  CXR 5/15 >>> no pneumothorax, one left chest tube  CXR 5/17 >> near complete resolution L PTX, 1 CT in place  5/14 EKG>> Sinus tach, non specific ST wave changes  Assessment and Plan:  Spontaneous Left Pneumothorax with bullous emphysema Hx of previous L & R PTX with chest tubes.  L CT placed per Dr. [name] on 5/10.  5/13 with recurrent ptx after water seal.  Second CT placed by CT surgery  Plan: -L CT x1 per CT surgery, ? To waterseal 5/17 -s/p VATS pleurodesis 5/16 -wean o2 as able, pain and atx playing a role here   COPD (chronic obstructive pulmonary disease)  Active smoking PTA, no exacerbation in house Plan: -continue symbicort -duonebs q 6h  -will need outpatient f/u with [facility] post  discharge (no prior [place] pulmonologist)  ETOH abuse  Consumes 1-10 beers per day Plan: -no evidence of withdrawal yet -CIWA off -MVI / Folate / Thiamine  Pain control issues Daily complaints of R chest pain but no objective evidence of ischemia or pneumothorax, on adequate DVT prophylaxis so PE less likely; suspect musculoskeletal chronic pain Plan: -oxycodone Q12, tramadol Q6 (changed to prn) -PRN morphine  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

## 2011-09-23 NOTE — Op Note (Signed)
NAME:  Philip Coleman, Philip Coleman NO.:  [id number]  MEDICAL RECORD NO.:  [id number]  LOCATION:  3312                         FACILITY:  [facility]  PHYSICIAN:  [name], M.D.     DATE OF BIRTH:  02/04/1956  DATE OF PROCEDURE:  09/22/2011 DATE OF DISCHARGE:                              OPERATIVE REPORT   PREOPERATIVE DIAGNOSIS:  Recurrent spontaneous left pneumothorax, secondary to chronic obstructive pulmonary disease.  POSTOPERATIVE DIAGNOSIS:  Recurrent spontaneous left pneumothorax, secondary to chronic obstructive pulmonary disease.  OPERATIVE PROCEDURE:  Left video-assisted thoracoscopy, left mini thoracotomy with stapling of left upper lobe blebs and mechanical pleurodesis.  ATTENDING SURGEON:  [name], M.D.  ASSISTANT:  Rowe Clack, PA-C  ANESTHESIA:  General endotracheal.  CLINICAL HISTORY:  This patient is a 56 year old gentleman with a history of smoking and severe COPD who has had recurrent spontaneous pneumothorax on both sides.  He presented with spontaneous left pneumothorax and had a chest tube inserted.  This re-expanded his lung and he did not have an air leak.  About 2 days later, the patient developed recurrent left pneumothorax about 40%, but there was no air coming out of the left chest tube and therefore another chest tube had to be inserted with re-expansion of his lung.  We did obtain a CT scan of the chest, which showed significant bullous lung disease.  The bullae appeared to be worse in the right lung and the left lung, but there were some noted at the apex as well as in the lingula.  Given the patient's history of recurrent spontaneous pneumothorax on both sides and the fact that his lung collapsed again with a chest tube in here, I felt it would be best to proceed with left lung bleb stapling and mechanical pleurodesis.  I discussed the operative procedure with him including the alternatives, benefits, and risks including, but not  limited to, bleeding, injury to the lung, prolonged air leak, and the possibility that the pneumothorax may recur despite doing this procedure.  He understood this and agreed to proceed.  OPERATIVE PROCEDURE:  The patient was seen in the preoperative holding area.  Proper patient, proper operation and proper operative side were confirmed after reviewing his CT scans in the chart.  The left side of the chest was signed by me.  He was taken back to the operating room, placed on table in supine position.  After induction of general endotracheal anesthesia, a Foley catheter was placed in bladder using sterile technique.  Lower extremities sequential compression devices were used.  Preoperative intravenous antibiotics were given.  The patient was then turned in the right lateral decubitus position with the left side up.  The left chest tube was removed.  Then, the left side of the chest was prepped with Betadine soap and solution and draped in the usual sterile manner.  A 1 cm incision was made about the midaxillary line at the 8th intercostal space and the 10 mm trocar was inserted. The 30 degree thoracoscope was used.  Examination of the pleural space showed that there were diffuse adhesions present between the lung and the chest wall as well as the lung and mediastinum.  This was surprising because the patient had developed a 40% pneumothorax.  The adhesions appeared more dense medially along the mediastinum as well as in the apex and at the base of the lung.  Laterally, there were fewer adhesions.  The apex appeared to particularly stuck to the apex of the chest.  Given the diffuse adhesions and inability to really manipulate the lung, I felt that in order to perform the procedure, I would need to make a small lateral thoracotomy.  This was performed in the latissimus muscle retracted posteriorly.  I did divide this rate is over a short distance.  The pleural space was entered through the  through the 5th intercostal space.  Some of the looser adhesions were taken down medially to allow mobilization of the lingula of the lung which had 2 large blebs in it.  After mobilizing this portion of the lung, these blebs were excised using a stapler and sent to pathology.  There did appear to be some blebs at the apex of the chest, but this was firmly adherent to the apex and; therefore, I decided to leave these alone. Then, a mechanical pleurodesis was performed with a piece of a cautery scratch pad.  We essentially performed a pleurectomy removing pleural over all visible surfaces that were not involved with adhesions already. The pleura was pulled off using a clamp.  At the conclusion of the pleurodesis and pleurectomy, a 28-French chest tube was brought through the trocar site and positioned posteriorly and up the apex.  The ribs were then reapproximated with #2 Vicryl pericostal sutures.  The lung was reinflated.  The serratus muscle was closed with continuous 0 Vicryl suture.  Subcutaneous tissue was closed with continuous 2-0 Vicryl and the skin with a 3-0 Vicryl subcuticular closure.  The sponge, needle and instrument counts were correct according to the scrub nurse.  Dry sterile dressing was applied over the incision and around the chest tube which was hooked to Pleur-Evac suction.  The patient remained hemodynamically stable and transferred to the SICU in good condition.     [name], M.D.     BB/MEDQ  D:  09/22/2011  T:  09/23/2011  Job:  [id number]

## 2011-09-23 NOTE — Progress Notes (Addendum)
[address]            [place] [address]          [phone]     1 Day Post-Op Procedure(s) (LRB): VIDEO ASSISTED THORACOSCOPY (VATS)/THOROCOTOMY (Left)  Subjective: OOB in chair.  Became SOB when getting up, now on facemask O2.  +cough, pain controlled by PCA.  Objective: Vital signs in last 24 hours: Patient Vitals for the past 24 hrs:  BP Temp Temp src Pulse Resp SpO2 Height Weight  09/23/11 0756 - - - - 15  97 % - -  09/23/11 0425 91/53 mmHg 98.7 F (37.1 C) Oral 72  15  100 % - -  09/23/11 0211 - - - - 18  98 % - -  09/22/11 2345 102/59 mmHg 97.6 F (36.4 C) Oral 79  17  98 % - -  09/22/11 2201 - - - - - 99 % - -  09/22/11 2000 - - - - 13  99 % - -  09/22/11 1930 98/58 mmHg 98.9 F (37.2 C) Oral 80  16  100 % 5\' 7"  (1.702 m) 122 lb 5.7 oz (55.5 kg)  09/22/11 1851 - - - 94  11  95 % - -  09/22/11 1850 - - - 87  11  96 % - -  09/22/11 1849 - - - 87  12  96 % - -  09/22/11 1848 - - - 86  18  97 % - -  09/22/11 1847 - - - 84  13  96 % - -  09/22/11 1846 - - - 82  13  96 % - -  09/22/11 1845 - - - 83  14  95 % - -  09/22/11 1844 - - - 85  18  95 % - -  09/22/11 1843 - - - 88  17  92 % - -  09/22/11 1842 - - - 87  14  90 % - -  09/22/11 1841 - - - 86  14  88 % - -  09/22/11 1840 - - - 95  15  88 % - -  09/22/11 1839 92/56 mmHg - - 93  15  90 % - -  09/22/11 1838 - - - 89  14  95 % - -  09/22/11 1837 - - - 90  15  95 % - -  09/22/11 1836 - - - 88  15  95 % - -  09/22/11 1835 - - - 88  16  94 % - -  09/22/11 1834 - - - 87  13  94 % - -  09/22/11 1833 - - - 83  13  93 % - -  09/22/11 1832 - - - 85  15  92 % - -  09/22/11 1831 - - - 91  13  91 % - -  09/22/11 1830 96/55 mmHg - - 95  16  90 % - -  09/22/11 1815 - - - 92  15  92 % - -  09/22/11 1800 99/53 mmHg - - 95  20  95 % - -  09/22/11 1755 112/47 mmHg - - - - - - -  09/22/11 1745 - - - 95  17  90 % - -  09/22/11 1739 130/50 mmHg - - - - - - -  09/22/11 1730 - - -  103  17  97  % - -  09/22/11 1724 123/59 mmHg - - - - - - -  09/22/11 1715 - - - 102  18  91 % - -  09/22/11 1709 144/67 mmHg - - - - - - -  09/22/11 1700 - - - 101  15  93 % - -  09/22/11 1654 146/64 mmHg - - - - - - -  09/22/11 1645 153/52 mmHg - - 115  19  88 % - -  09/22/11 1630 127/51 mmHg - - 91  18  90 % - -  09/22/11 1621 - 97.8 F (36.6 C) - - - - - -   Current Weight  09/22/11 122 lb 5.7 oz (55.5 kg)     Intake/Output from previous day: 05/16 0701 - 05/17 0700 In: 2300 [I.V.:2050; IV Piggyback:250] Out: 1330 [Urine:1100; Blood:100; Chest Tube:130]    PHYSICAL EXAM:  Heart: RRR Lungs:coarse bilateral BS, few exp wheezes, +small amount SQ air on left Wound: dressed and dry Chest tube: no air leak   Lab Results: CBC: Basename 09/23/11 0345  WBC 13.3*  HGB 13.4  HCT 40.6  PLT 266   BMET:  Basename 09/23/11 0345 09/22/11 0530  NA 128* 130*  K 5.0 4.8  CL 91* 90*  CO2 29 33*  GLUCOSE 96 96  BUN 12 14  CREATININE 0.69 0.79  CALCIUM 8.7 9.4    PT/INR: No results found for this basename: LABPROT,INR in the last 72 hours  CXR: Near complete resolution of a left-sided pneumothorax with  persistent bibasilar density and right basilar bullous changes.   Assessment/Plan: S/P Procedure(s) (LRB): VIDEO ASSISTED THORACOSCOPY (VATS)/THOROCOTOMY (Left) CT without obvious air leak, CXR stable. Will leave on suction for now.  Possibly wean to H2O seal later. Small amt SQ emphysema.  Monitor. Pulm- Continue Symbicort, albuterol nebs.  Per CCM. D/c art line, Foley, mobilize. Hyponatremia- c/w SIADH.  Continue free H2O restriction and monitor. H/o EtOH- cont DT prophylaxis   LOS: 7 days    COLLINS,GINA H 09/23/2011    Chart reviewed, patient examined, agree with above. Can probably remove chest tube Saturday.

## 2011-09-23 NOTE — Progress Notes (Signed)
Utilization review completed.  

## 2011-09-24 ENCOUNTER — Inpatient Hospital Stay (HOSPITAL_COMMUNITY): Payer: Medicare Other

## 2011-09-24 LAB — COMPREHENSIVE METABOLIC PANEL
BUN: 8 mg/dL (ref 6–23)
Calcium: 9 mg/dL (ref 8.4–10.5)
GFR calc Af Amer: >90 mL/min (ref >90)
Glucose, Bld: 94 mg/dL (ref 70–99)
Total Protein: 6.6 g/dL (ref 6.0–8.3)

## 2011-09-24 LAB — GLUCOSE, CAPILLARY
Glucose-Capillary: 95 mg/dL (ref 70–99)
Glucose-Capillary: 102 mg/dL — ABNORMAL HIGH (ref 70–99)

## 2011-09-24 LAB — CBC
HCT: 41.1 % (ref 39.0–52.0)
Hemoglobin: 13.5 g/dL (ref 13.0–17.0)
MCH: 29.4 pg (ref 26.0–34.0)
MCHC: 32.8 g/dL (ref 30.0–36.0)
RDW: 14.2 % (ref 11.5–15.5)

## 2011-09-24 MED ORDER — OXYCODONE HCL 5 MG PO TABS
5.0000 mg | ORAL_TABLET | ORAL | Status: DC | PRN
  Administered 2011-09-24 – 2011-09-26 (×4): 5 mg via ORAL
  Filled 2011-09-24 (×4): qty 1

## 2011-09-24 MED ORDER — INSULIN ASPART 100 UNIT/ML ~~LOC~~ SOLN: 0.0000 [IU] | Freq: Three times a day (TID) | SUBCUTANEOUS | Status: DC

## 2011-09-24 MED ORDER — INSULIN ASPART 100 UNIT/ML ~~LOC~~ SOLN: 0.0000 [IU] | Freq: Every day | SUBCUTANEOUS | Status: DC

## 2011-09-24 MED ORDER — SODIUM CHLORIDE 0.9 % IJ SOLN
INTRAMUSCULAR | Status: AC
  Administered 2011-09-24: 9 mL via INTRAVENOUS
  Filled 2011-09-24: qty 10

## 2011-09-24 MED ORDER — NICOTINE 21 MG/24HR TD PT24
21.0000 mg | MEDICATED_PATCH | Freq: Every day | TRANSDERMAL | Status: DC
  Administered 2011-09-24 – 2011-09-25 (×2): 21 mg via TRANSDERMAL
  Filled 2011-09-24 (×3): qty 1

## 2011-09-24 NOTE — Progress Notes (Signed)
Name: Philip Coleman MRN: [id number] DOB: 03/24/1956    LOS: 8  [facility]   Primary Pulm:  Christina Billinger @ [facility]  History of Present Illness:  89 y/owm active smoker with PMH of Hodgkin's Disease, Thyroid Disease, Severe COPD, h/o Recurrent spontaneous Pnuemothorax adm 5/10 with lt spon pnthx requiring chest tube. Transiently placed on bipap in ER. Chest tube placed by TCTS [name]) with immediate relief of dyspnea.     Tests / Events: 5/13 cxr with repeat PTX after water seal 5/13 continued pneumothorax, second chest tube placed 5/16 VATS pleurodesis  Subjective: Continues to have discomfort at chest tube site.  Pain med available.  No increased wob.     Vital Signs: BP 128/71  Pulse 101  Temp(Src) 99.5 F (37.5 C) (Oral)  Resp 12  Ht 5\' 7"  (1.702 m)  Wt 55.5 kg (122 lb 5.7 oz)  BMI 19.16 kg/m2  SpO2 96%  4L [place] Intake/Output      05/17 0701 - 05/18 0700 05/18 0701 - 05/19 0700   I.V. (mL/kg) 520 (9.4) 69 (1.2)   IV Piggyback  2   Total Intake(mL/kg) 520 (9.4) 71 (1.3)   Urine (mL/kg/hr) 3500 (2.6)    Blood     Chest Tube 30    Total Output 3530    Net -3010 +71            Physical Examination: Thin male in nad Nose without purulence or discharge Chest with mild decreased bs, no wheezing Cor with rrr abd benign LE without edema, no cyanosis Alert and oriented, not confused, moves all 4.   Labs    CBC  Lab 09/24/11 0407 09/23/11 0345  HGB 13.5 13.4  HCT 41.1 40.6  WBC 12.0* 13.3*  PLT 268 266   BMET  Lab 09/24/11 0407 09/23/11 0345 09/22/11 0530 09/21/11 0525 09/20/11 0520  NA 132* 128* 130* 131* 129*  K 4.3 5.0 -- -- --  CL 91* 91* 90* 90* 89*  CO2 31 29 33* 35* 30  GLUCOSE 94 96 96 91 87  BUN 8 12 14 14 17   CREATININE 0.62 0.69 0.79 0.82 0.80  CALCIUM 9.0 8.7 9.4 9.1 8.8  MG -- -- -- -- --  PHOS -- -- -- -- --    Lab 09/23/11 0417  PHART 7.314*  PCO2ART 56.9*  PO2ART 79.4*  HCO3 28.1*  TCO2  29.9  O2SAT 94.4     Radiology: pCXR 5/11 >>> No significant change in aeration the lungs compared with prior exam. Stable left chest tube without visible pneumothorax.  CXR 5/13>>>New moderate sized left hydropneumothorax with left chest tube is stable position.  CXR 5/14 >>> two left chest tubes, no large pneumothorax on left  CXR 5/15 >>> no pneumothorax, one left chest tube  CXR 5/17 >> near complete resolution L PTX, 1 CT in place  5/14 EKG>> Sinus tach, non specific ST wave changes  Assessment and Plan:  Spontaneous Left Pneumothorax with bullous emphysema Hx of previous L & R PTX with chest tubes.  L CT placed per Dr. [name] on 5/10.  5/13 with recurrent ptx after water seal.  Second CT placed by CT surgery  Plan:  -s/p VATS pleurodesis 5/16. Final ct per CT surgery -wean o2 as able   COPD (chronic obstructive pulmonary disease)  Active smoking PTA, no exacerbation in house.  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  Sodium improving on todays labs

## 2011-09-24 NOTE — Progress Notes (Addendum)
2 Days Post-Op Procedure(s) (LRB): VIDEO ASSISTED THORACOSCOPY (VATS)/THOROCOTOMY (Left)  Subjective: Patient hopes chest tube is removed soon.  Objective: Vital signs in last 24 hours: Patient Vitals for the past 24 hrs:  BP Temp Temp src Pulse Resp SpO2  09/24/11 1212 - - - - 19  94 %  09/24/11 1206 132/72 mmHg 98.5 F (36.9 C) Oral 94  21  97 %  09/24/11 0900 128/71 mmHg 99.5 F (37.5 C) Oral 101  12  96 %  09/24/11 0858 128/71 mmHg - - 98  13  99 %  09/24/11 0850 - - - - - 94 %  09/24/11 0800 - - - - 12  96 %  09/24/11 0325 117/68 mmHg 98.3 F (36.8 C) Oral 104  16  93 %  09/24/11 0110 - - - - - 95 %  09/23/11 2356 119/56 mmHg 98.9 F (37.2 C) Oral 102  20  92 %  09/23/11 2045 - - - - - 95 %  09/23/11 2032 128/75 mmHg 98 F (36.7 C) Oral 85  16  95 %  09/23/11 1910 - - - - 22  100 %  09/23/11 1543 137/72 mmHg - - 82  21  99 %  09/23/11 1538 - 98.9 F (37.2 C) Oral - - 94 %  09/23/11 1505 - - - - 18  97 %    Current Weight  09/22/11 122 lb 5.7 oz (55.5 kg)      Intake/Output from previous day: 05/17 0701 - 05/18 0700 In: 520 [I.V.:520] Out: 3530 [Urine:3500; Chest Tube:30]   Physical Exam:  Cardiovascular:Slightly tachycardic. Pulmonary: Mildly diminshed L>R; no rales, wheezes, or rhonchi.Minor subcutaneous emphysema left chest. Abdomen: Soft, non tender, bowel sounds present. Extremities: No lower extremity edema. Wounds: Dressing with sero sanguinous drainage from chest tube site.  Lab Results: CBC: Basename 09/24/11 0407 09/23/11 0345  WBC 12.0* 13.3*  HGB 13.5 13.4  HCT 41.1 40.6  PLT 268 266   BMET:  Basename 09/24/11 0407 09/23/11 0345  NA 132* 128*  K 4.3 5.0  CL 91* 91*  CO2 31 29  GLUCOSE 94 96  BUN 8 12  CREATININE 0.62 0.69  CALCIUM 9.0 8.7    PT/INR: No results found for this basename: LABPROT,INR in the last 72 hours ABG:  INR: Will add last result for INR, ABG once components are confirmed Will add last 4 CBG results once  components are confirmed  Assessment/Plan:  1.  Pulmonary - Chest tube with 300 cc of output yesterday and no real output last 12 hours..Chest tube is to suction.There is no air leak.Possible place to water seal.Wean O2 as tolerates.Check CXR in am. 2.Hyponatremia-Sodium up to 132 with water restriction.    ZIMMERMAN,DONIELLE MPA-C 09/24/2011   I have seen and examined the patient and agree with the assessment and plan as outlined.  Chest tube to water seal  Magalene Mclear H 09/24/2011 4:55 PM

## 2011-09-25 ENCOUNTER — Inpatient Hospital Stay (HOSPITAL_COMMUNITY): Payer: Medicare Other

## 2011-09-25 LAB — GLUCOSE, CAPILLARY
Glucose-Capillary: 98 mg/dL (ref 70–99)
Glucose-Capillary: 115 mg/dL — ABNORMAL HIGH (ref 70–99)
Glucose-Capillary: 109 mg/dL — ABNORMAL HIGH (ref 70–99)
Glucose-Capillary: 119 mg/dL — ABNORMAL HIGH (ref 70–99)

## 2011-09-25 MED ORDER — SENNOSIDES-DOCUSATE SODIUM 8.6-50 MG PO TABS
1.0000 | ORAL_TABLET | Freq: Two times a day (BID) | ORAL | Status: DC
  Administered 2011-09-25 (×2): 1 via ORAL
  Filled 2011-09-25 (×4): qty 1

## 2011-09-25 NOTE — Progress Notes (Signed)
Name: Philip Coleman MRN: [id number] DOB: 05-01-1956    LOS: 9  [facility]   Primary Pulm:  Christina Billinger @ [facility]  History of Present Illness:  19 y/owm active smoker with PMH of Hodgkin's Disease, Thyroid Disease, Severe COPD, h/o Recurrent spontaneous Pnuemothorax adm 5/10 with lt spon pnthx requiring chest tube. Transiently placed on bipap in ER. Chest tube placed by TCTS [name]) with immediate relief of dyspnea.     Tests / Events: 5/13 cxr with repeat PTX after water seal 5/13 continued pneumothorax, second chest tube placed 5/16 VATS pleurodesis  Subjective: Stable overnight with no issues.  Having constipation likely from pain meds.  No increased wob.  cxr stable yest.   Vital Signs: BP 131/79  Pulse 88  Temp(Src) 98.6 F (37 C) (Oral)  Resp 19  Ht 5\' 7"  (1.702 m)  Wt 55.5 kg (122 lb 5.7 oz)  BMI 19.16 kg/m2  SpO2 96%  Intake/Output      05/18 0701 - 05/19 0700 05/19 0701 - 05/20 0700   P.O. 600 420   I.V. (mL/kg) 489 (8.8) 60 (1.1)   IV Piggyback 2    Total Intake(mL/kg) 1091 (19.7) 480 (8.6)   Urine (mL/kg/hr) 750 (0.6) 400 (1.6)   Chest Tube 30    Total Output 780 400   Net +311 +80        Urine Occurrence 1 x 1 x       Physical Examination: Thin male in nad Nose without purulence or discharge Chest with mild decreased bs, no wheezing, a few crackles Cor with rrr abd benign LE without edema, no cyanosis Alert and oriented, not confused, moves all 4.   Labs    CBC  Lab 09/24/11 0407 09/23/11 0345  HGB 13.5 13.4  HCT 41.1 40.6  WBC 12.0* 13.3*  PLT 268 266   BMET  Lab 09/24/11 0407 09/23/11 0345 09/22/11 0530 09/21/11 0525 09/20/11 0520  NA 132* 128* 130* 131* 129*  K 4.3 5.0 -- -- --  CL 91* 91* 90* 90* 89*  CO2 31 29 33* 35* 30  GLUCOSE 94 96 96 91 87  BUN 8 12 14 14 17   CREATININE 0.62 0.69 0.79 0.82 0.80  CALCIUM 9.0 8.7 9.4 9.1 8.8  MG -- -- -- -- --  PHOS -- -- -- -- --    Lab 09/23/11  0417  PHART 7.314*  PCO2ART 56.9*  PO2ART 79.4*  HCO3 28.1*  TCO2 29.9  O2SAT 94.4   Assessment and Plan:  Spontaneous Left Pneumothorax with bullous emphysema Hx of previous L & R PTX with chest tubes.  L CT placed per Dr. [name] on 5/10.  5/13 with recurrent ptx after water seal.  Second CT placed by CT surgery.  Currently cxr is stable, and no obvious leak on water seal.    Plan:  -s/p VATS pleurodesis 5/16. Final ct per CT surgery -d/c oxygen since sats are stable.    COPD (chronic obstructive pulmonary disease)  Active smoking PTA, no exacerbation in house.  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  Sodium improving on todays labs Will check labs again in am.

## 2011-09-25 NOTE — Progress Notes (Addendum)
3 Days Post-Op Procedure(s) (LRB): VIDEO ASSISTED THORACOSCOPY (VATS)/THOROCOTOMY (Left)  Subjective: Patient with some pain at chest tube site.  Objective: Vital signs in last 24 hours: Patient Vitals for the past 24 hrs:  BP Temp Temp src Pulse Resp SpO2  09/25/11 0907 - - - - - 96 %  09/25/11 0800 131/79 mmHg 98.6 F (37 C) Oral - - -  09/25/11 0448 - - - 88  19  93 %  09/25/11 0329 139/81 mmHg - - 100  22  95 %  09/24/11 2315 140/87 mmHg 97.9 F (36.6 C) Oral 86  17  96 %  09/24/11 2110 - - - - - 93 %  09/24/11 2056 - - - - - 95 %  09/24/11 1900 139/77 mmHg 99.1 F (37.3 C) Oral 101  17  94 %  09/24/11 1617 - - - - - 96 %  09/24/11 1600 - - - - 19  94 %  09/24/11 1515 - - - - 20  98 %  09/24/11 1509 126/70 mmHg 98.5 F (36.9 C) Oral 85  13  97 %  09/24/11 1212 - - - - 19  94 %  09/24/11 1206 132/72 mmHg 98.5 F (36.9 C) Oral 94  21  97 %    Current Weight  09/22/11 122 lb 5.7 oz (55.5 kg)      Intake/Output from previous day: 05/18 0701 - 05/19 0700 In: 1091 [P.O.:600; I.V.:489; IV Piggyback:2] Out: 780 [Urine:750; Chest Tube:30]   Physical Exam:  Cardiovascular:RRR. Pulmonary: Mildly diminshed L>R;rhonch; no rales or wheezes.Minor subcutaneous emphysema left chest. Abdomen: Soft, non tender, bowel sounds present. Extremities: No lower extremity edema. Wounds: Dressing with sero sanguinous drainage from chest tube site.  Lab Results: CBC:  Basename 09/24/11 0407 09/23/11 0345  WBC 12.0* 13.3*  HGB 13.5 13.4  HCT 41.1 40.6  PLT 268 266   BMET:   Basename 09/24/11 0407 09/23/11 0345  NA 132* 128*  K 4.3 5.0  CL 91* 91*  CO2 31 29  GLUCOSE 94 96  BUN 8 12  CREATININE 0.62 0.69  CALCIUM 9.0 8.7    PT/INR: No results found for this basename: LABPROT,INR in the last 72 hours ABG:  INR: Will add last result for INR, ABG once components are confirmed Will add last 4 CBG results once components are confirmed  Assessment/Plan:  1.  Pulmonary -  CXR this am shows no ptx, right apical blebs, and improvement of bilateral airspace disease.Chest tube with minimal output last 24 hours.Chest tube is to water seal.There is no air leak.Probable remove.Wean O2 as tolerates (on 1L [place]).Check CXR in am. 2.Hyponatremia-Last sodium up to 132 with water restriction. 3.Management per CCM.   ZIMMERMAN,DONIELLE MPA-C 09/25/2011 10:22 AM   I have seen and examined the patient and agree with the assessment and plan as outlined.  D/C chest tube.  Rabia Argote H 09/25/2011 12:24 PM

## 2011-09-25 NOTE — Progress Notes (Signed)
Chest tube d/c per MD order. Pt's vital signs stable, no shortness of breath, lungs clear, pt educated to call RN if becomes SOB, or any difficulties, pt tol procedure well.

## 2011-09-26 ENCOUNTER — Inpatient Hospital Stay (HOSPITAL_COMMUNITY): Payer: Medicare Other

## 2011-09-26 LAB — CBC
HCT: 41.8 % (ref 39.0–52.0)
Hemoglobin: 14.2 g/dL (ref 13.0–17.0)
MCHC: 34 g/dL (ref 30.0–36.0)
WBC: 13.7 10*3/uL — ABNORMAL HIGH (ref 4.0–10.5)

## 2011-09-26 LAB — BASIC METABOLIC PANEL
BUN: 5 mg/dL — ABNORMAL LOW (ref 6–23)
Chloride: 95 mEq/L — ABNORMAL LOW (ref 96–112)
GFR calc Af Amer: >90 mL/min (ref >90)
GFR calc non Af Amer: >90 mL/min (ref >90)
Potassium: 3.7 mEq/L (ref 3.5–5.1)
Sodium: 134 mEq/L — ABNORMAL LOW (ref 135–145)

## 2011-09-26 LAB — GLUCOSE, CAPILLARY

## 2011-09-26 MED ORDER — THIAMINE HCL 100 MG PO TABS: 100.0000 mg | ORAL_TABLET | Freq: Every day | ORAL | Status: DC

## 2011-09-26 MED ORDER — FOLIC ACID 1 MG PO TABS: 1.0000 mg | ORAL_TABLET | Freq: Every day | ORAL | Status: DC

## 2011-09-26 MED ORDER — SENNOSIDES-DOCUSATE SODIUM 8.6-50 MG PO TABS: 1.0000 | ORAL_TABLET | Freq: Two times a day (BID) | ORAL | Status: DC

## 2011-09-26 MED ORDER — OXYCODONE HCL 5 MG PO TABS: 5.0000 mg | ORAL_TABLET | ORAL | Status: DC | PRN

## 2011-09-26 NOTE — Progress Notes (Addendum)
                    [address]            [place] [address]          [phone]     4 Days Post-Op Procedure(s) (LRB): VIDEO ASSISTED THORACOSCOPY (VATS)/THOROCOTOMY (Left)  Subjective: Comfortable.  No complaints.  Objective: Vital signs in last 24 hours: Patient Vitals for the past 24 hrs:  BP Temp Temp src Pulse Resp SpO2  09/26/11 0751 - 98.4 F (36.9 C) Oral - - -  09/26/11 0317 114/65 mmHg 98.7 F (37.1 C) Oral - - -  09/26/11 0000 119/65 mmHg 98.7 F (37.1 C) Oral 95  24  95 %  09/25/11 1953 113/63 mmHg 98.7 F (37.1 C) Oral 103  18  90 %  09/25/11 1530 120/78 mmHg 99.5 F (37.5 C) Oral 98  17  91 %  09/25/11 1245 133/76 mmHg 98.3 F (36.8 C) Oral 103  19  93 %  09/25/11 1146 - 98.3 F (36.8 C) Oral - - -  09/25/11 0907 - - - - - 96 %   Current Weight  09/22/11 122 lb 5.7 oz (55.5 kg)     Intake/Output from previous day: 05/19 0701 - 05/20 0700 In: 1240 [P.O.:780; I.V.:460] Out: 1800 [Urine:1800]    PHYSICAL EXAM:  Heart: RRR Lungs: clear Wound: clean and dry   Lab Results: CBC: Basename 09/26/11 0330 09/24/11 0407  WBC 13.7* 12.0*  HGB 14.2 13.5  HCT 41.8 41.1  PLT 329 268   BMET:  Basename 09/26/11 0330 09/24/11 0407  NA 134* 132*  K 3.7 4.3  CL 95* 91*  CO2 31 31  GLUCOSE 113* 94  BUN 5* 8  CREATININE 0.59 0.62  CALCIUM 9.2 9.0    CXR: No pneumothorax is seen.  Improving left lower lobe opacity, likely postsurgical.    Assessment/Plan: S/P Procedure(s) (LRB): VIDEO ASSISTED THORACOSCOPY (VATS)/THOROCOTOMY (Left) Stable from surgery.  Can d/c home from our standpoint whenever ok with primary service. Other issues per pulm/CCM.   LOS: 10 days    COLLINS,GINA H 09/26/2011    Chart reviewed, patient examined, agree with above. I think he can go home.  Keep chest tube sutures in and remove in office in 1 wk.

## 2011-09-26 NOTE — Discharge Summary (Signed)
Physician Discharge Summary  Patient ID: Philip Coleman MRN: [id number] DOB/AGE: 1955/06/07 56 y.o.  Admit date: 09/16/2011 Discharge date: 09/26/2011    Discharge Diagnoses:  Principal Problem:  *Pneumothorax, left Active Problems:  COPD (chronic obstructive pulmonary disease)  ETOH abuse  Hyponatremia    Brief Summary: Philip Coleman is a 30 y/owm active smoker with PMH of Hodgkin's Disease, Thyroid Disease, Severe COPD, h/o Recurrent spontaneous Pnuemothorax adm 5/10 with lt spon pnthx requiring chest tube. Transiently placed on bipap in ER. Chest tube placed by TCTS [name]) with immediate relief of dyspnea.  Taken for VATS pleurodesis by CVTS 5/16.      Spontaneous Left Pneumothorax with bullous emphysema  Hx of previous L & R PTX with chest tubes. L CT placed per Dr. [name] on 5/10. 5/13 with recurrent ptx after water seal. Second CT placed by CT surgery. Currently cxr is stable post d/c CT 5/19.  Attempted off O2 and sats 84% on RA so will d/c home with O2, hopefully short term.  5/20 pt is stable for d/c home per CVTS.  Keep chest tube sutures in and remove in office in 1 wk.  COPD --Active smoking PTA, no exacerbation in house. No bronchospasm on exam or increased wob.  No acute exacerbation this admit. Will plan to continue symbicort and PRN BD at d/c.  He would like to change to [place] [facility].  Will have him see NP in office in 2 weeks then f/u with Dr. Craige Cotta.   Have stressed the importance of smoking cessation multiple times and will need to cont to encourage smoking cessation.   ETOH abuse -Consumes 1-10 beers per day.  No evidence withdrawal.  Monitored with CIWA.  Would cont  MVI / Folate / Thiamine at d/c.  Seen by SW.  Discussed quitting.   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   Discharge Labs  BMET  Lab 09/26/11 0330 09/24/11 0407 09/23/11 0345 09/22/11 0530 09/21/11 0525  NA 134* 132* 128* 130*  131*  K 3.7 4.3 -- -- --  CL 95* 91* 91* 90* 90*  CO2 31 31 29  33* 35*  GLUCOSE 113* 94 96 96 91  BUN 5* 8 12 14 14   CREATININE 0.59 0.62 0.69 0.79 0.82  CALCIUM 9.2 9.0 8.7 9.4 9.1  MG -- -- -- -- --  PHOS -- -- -- -- --     CBC   Lab 09/26/11 0330 09/24/11 0407 09/23/11 0345  HGB 14.2 13.5 13.4  HCT 41.8 41.1 40.6  WBC 13.7* 12.0* 13.3*  PLT 329 268 266      Follow-up Information    Follow up with Alleen Borne, MD. (PA/LAT CXR to be taken (at [facility] in same building as Dr. Sharee Pimple office) on 10/11/2011 at 12:00 pm;Appointment with Dr. Laneta Simmers is on 10/11/2011 at 1:00 pm)    Contact information:   [address] [place] [address] [phone]       Follow up with [facility] [place]. Schedule an appointment as soon as possible for a visit in 1 week. (Call for suture removal appointment with nurse in 1 week)    Contact information:   [address] [place] [address] [phone]      Follow up with Rubye Oaks, NP on 10/10/2011. (10:30am )    Contact information:   [employer] [address] [place] [address] [phone]  Medication List  As of 09/26/2011 11:08 AM   START taking these medications         folic acid 1 MG tablet   Commonly known as: FOLVITE   Take 1 tablet (1 mg total) by mouth daily.      oxyCODONE 5 MG immediate release tablet   Commonly known as: Oxy IR/ROXICODONE   Take 1-2 tablets (5-10 mg total) by mouth every 4 (four) hours as needed for pain.      senna-docusate 8.6-50 MG per tablet   Commonly known as: Senokot-S   Take 1 tablet by mouth 2 (two) times daily.      thiamine 100 MG tablet   Take 1 tablet (100 mg total) by mouth daily.         CONTINUE taking these medications         albuterol 108 (90 BASE) MCG/ACT inhaler   Commonly known as: PROVENTIL HFA;VENTOLIN HFA      budesonide-formoterol 160-4.5 MCG/ACT inhaler    Commonly known as: SYMBICORT          Where to get your medications    These are the prescriptions that you need to pick up. We sent them to a specific pharmacy, so you will need to go there to get them.   [id number] - [place], [place] - [address] AT [facility] [address]    [address] [place] [address]    Phone: [phone]        thiamine 100 MG tablet         You may get these medications from any pharmacy.         folic acid 1 MG tablet   oxyCODONE 5 MG immediate release tablet   senna-docusate 8.6-50 MG per tablet              Disposition: 01-Home or Self Care  Discharged Condition: Philip Coleman has met maximum benefit of inpatient care and is medically stable and cleared for discharge.  Patient is pending follow up as above.      Time spent on disposition:  Greater than 35 minutes.   SignedDanford Bad, NP 09/26/2011  11:08 AM Pager: [phone]  [name], MD 09/26/2011, 12:55 PM Pager:  [phone]

## 2011-09-26 NOTE — Progress Notes (Signed)
MEDICARE/MEDICAID-CERTIFIED DURABLE MEDICAL EQUIPMENT COMPANIES   Company  Telephone Number Address [facility]  [facility].  [facility] has ownership interest  in this company; however, you are under no obligation to use this agency. [phone] or [phone] [address] [place], [place] [address] [place]  Education officer, museum and [employer] [phone] Fax [phone] [address] [place], [place], [place], [place], Haynes Bast, Edson Snowball, [employer] [phone] Fax [phone] [address] [place], [place]  [address] Nolene Ebbs  [facility]  [phone] Fax [phone] [address] [place], [place]  [address] [address]  [facility] [phone] or [phone] Fax [phone] [address] [place], [place] [address] [place], Roda Shutters, [place], Jolyne Loa  [facility] [phone] Fax [phone] [address] [place], [place] [address] Theotis Barrio, [place]  [facility] [phone] [address] [place], [place] Lonia Skinner  [facility] [phone] or [phone] Fax [phone] [address] [place], [place]  [place], [place], [place], [place], [place], [place] [facility], [employer] [phone] Fax [phone] [address] [place], [place]  [address] [place], [place], [place], [place], Burnis Kingfisher, Berton Lan, [place], Estrellita Ludwig  [facility] [phone] Fax [phone] [address] [place], [place], [place], [place], [place], Danelle Berry  [facility] [phone] Fax [phone] [address] [place], [place]  [address] [place] to [facility]  [facility] [phone] Fax [phone] [address] [place], [place]  [address] [place], [place], [place], [place], [place], [place], [place], [place], [place], [place], [place]   [facility], [employer]  Telephone Number Address [place]  [facility] [phone] or [phone] Fax [phone] [address], [place]  [address] Hali Marry, [place], [place], [place], [place], [place], [address]   Pres[place] Gum [phone] Fax [phone] [address] [place], [address] [place], [place]  [address] Mickie Hillier, [place], [place], Berton Lan, Haynes Bast, [place], [facility]  [employer], [place]. (MEDICARE ONLY) [phone] Fax [phone] [address] [place], [place]  [address] Salvadore Farber, [facility]  [employer] [phone] Fax [phone] [address] [place], [place]  [address] Perlie Mayo, Berton Lan, Lonia Skinner, [place], Guys  [employer] [phone] Fax [phone] [address] [place] [address] [place], [place], [place], Berton Lan, Lonia Skinner  [employer]. [phone] Fax [phone] [address] [place], [place]  [address] Rubye Oaks  VA-HenryMichiel Sites  [facility] [phone] Fax [phone] [address] [place], [place]  [address]  Doreatha Martin, [address] Denice Paradise  [place] [phone] or  [phone] Fax [phone] [address] [place], [place]  [address] [place]  [facility] [phone] or [phone] Fax [phone] [address] [place], [place]  [address] Joesph Fillers  [facility] [phone] Fax [phone] [address] [place], [place] [address] Cecil Cobbs, Rubye Oaks, [facility]. [phone] or [phone] [address] [place], [place]  [address] [place] and surrounding area  [facility] [phone] Fax [phone] [address] [place], [place]  [address] Elio Forget, [place]  [facility] [phone] Fax [phone] [address]  [place], [place]  [address] [facility] and surrounding area  [employer] [phone] or  [phone] Fax [phone] [address] [place], [place]  [address] [place], [place], [place], [place], [place], [place], [place], [place], [place]             NOT MEDICARE/MEDICAID CERTIFIED DURABLE MEDICAL EQUIPMENT COMPANIES   Company  Telephone Number Address [facility] Equipment and Uniforms [phone] Fax [phone] [address] [place], [place] Cristie Hem, [facility]  [employer] [phone] Fax [phone] [address] [place], [place]  [address] [facility] and surrounding area  [employer] [phone] Fax [phone] [address] [place], [place]  [address] Sherolyn Buba, Berton Lan, [address], [place], [place], Aaron Edelman  [place], Division of [employer] [phone] or [phone] Fax [phone] [address] [place], [place]  [employer] [phone] Fax [phone] [address] [place], [place]  [address] , Eulas Post, [address] Edson Snowball  [place] and [place] [phone] Fax [phone] [address] [place], [place]  [address] [place]

## 2011-09-26 NOTE — Discharge Instructions (Signed)
Home O2 arranged with [facility]- [phone] 

## 2011-09-26 NOTE — Progress Notes (Signed)
Name: Philip Coleman MRN: [id number] DOB: Feb 10, 1956    LOS: 10  [facility]   Primary Pulm:  Donzetta Starch @ [facility]>>wants to change to [facility]  History of Present Illness:  49 y/owm active smoker with PMH of Hodgkin's Disease, Thyroid Disease, Severe COPD, h/o Recurrent spontaneous Pnuemothorax adm 5/10 with lt spon pnthx requiring chest tube. Transiently placed on bipap in ER. Chest tube placed by TCTS [name]) with immediate relief of dyspnea.     Tests / Events: 5/13 cxr with repeat PTX after water seal 5/13 continued pneumothorax, second chest tube placed 5/16 VATS pleurodesis  Subjective: Breathing better.  Denies chest pain.  Still has cough, but clear sputum.   Vital Signs: BP 114/65  Pulse 95  Temp(Src) 98.4 F (36.9 C) (Oral)  Resp 24  Ht 5\' 7"  (1.702 m)  Wt 122 lb 5.7 oz (55.5 kg)  BMI 19.16 kg/m2  SpO2 92%  Intake/Output      05/19 0701 - 05/20 0700 05/20 0701 - 05/21 0700   P.O. 780    I.V. (mL/kg) 460 (8.3)    IV Piggyback     Total Intake(mL/kg) 1240 (22.3)    Urine (mL/kg/hr) 1800 (1.4)    Chest Tube     Total Output 1800    Net -560         Urine Occurrence 1 x    Stool Occurrence 2 x        Physical Examination: Thin male in nad Nose without purulence or discharge Chest with mild decreased bs, no wheezing, a few crackles Cor with rrr abd benign LE without edema, no cyanosis Alert and oriented, not confused, moves all 4.   Labs    CBC  Lab 09/26/11 0330 09/24/11 0407 09/23/11 0345  HGB 14.2 13.5 13.4  HCT 41.8 41.1 40.6  WBC 13.7* 12.0* 13.3*  PLT 329 268 266   BMET  Lab 09/26/11 0330 09/24/11 0407 09/23/11 0345 09/22/11 0530 09/21/11 0525  NA 134* 132* 128* 130* 131*  K 3.7 4.3 -- -- --  CL 95* 91* 91* 90* 90*  CO2 31 31 29  33* 35*  GLUCOSE 113* 94 96 96 91  BUN 5* 8 12 14 14   CREATININE 0.59 0.62 0.69 0.79 0.82  CALCIUM 9.2 9.0 8.7 9.4 9.1  MG -- -- -- -- --  PHOS -- -- -- -- --     Lab 09/23/11 0417  PHART 7.314*  PCO2ART 56.9*  PO2ART 79.4*  HCO3 28.1*  TCO2 29.9  O2SAT 94.4   Dg Chest 2 View  09/26/2011  *RADIOLOGY REPORT*  Clinical Data: Status post left knee thoracotomy, stapling of blebs  CHEST - 2 VIEW  Comparison: 01/26/2012  Findings: No pneumothorax is seen.  Minimal subcutaneous emphysema along the left lateral chest wall.  Chronic interstitial markings/emphysematous changes with dominant bulla at the right lung base.  Improving left lower lobe opacity, likely postsurgical.  Right basilar atelectasis.  Stable left subclavian venous catheter.  The heart is normal in size.  Surgical clips in the left upper abdomen.  Multiple left rib defects.  Mild degenerative changes of the thoracic spine.  IMPRESSION: No pneumothorax is seen.  Improving left lower lobe opacity, likely postsurgical.  Original Report Authenticated By: Charline Bills, M.D.   Dg Chest Port 1 View  09/25/2011  *RADIOLOGY REPORT*  Clinical Data: Evaluate for potential pneumothorax after chest tube removal.  PORTABLE CHEST - 1 VIEW  Comparison: Chest x-ray 09/25/2011.  Findings:  Previously noted left-sided chest tube has been removed. No definite left-sided pneumothorax is identified.  There are advanced changes of COPD with extensive bullous changes throughout the lungs bilaterally, similar to the prior examination.  In addition, there are mixed interstitial and airspace opacities throughout the left mid lung, which could suggest some mild edema in the more normal lung parenchyma, or could be indicative of superimposed infection.  Small left-sided pleural effusion. Multiple old healed left-sided rib fractures are again noted. Persistent subcutaneous emphysema in the left chest wall.  Old healed left midclavicular fracture again noted with significant post-traumatic deformity.  Surgical clips near the GE junction. Atherosclerotic calcifications within the arch of the aorta. There is a left-sided  subclavian central venous catheter with tip terminating in the proximal superior vena cava.  IMPRESSION: 1.  Support apparatus, as above. 2.  No definite pneumothorax following removal of left-sided chest tube. 3.  Radiographic appearance of the chest is otherwise essentially unchanged, as above.  Original Report Authenticated By: Florencia Reasons, M.D.   Dg Chest Port 1 View  09/25/2011  *RADIOLOGY REPORT*  Clinical Data: Chest tube  PORTABLE CHEST - 1 VIEW  Comparison: 09/23/2011  Findings: Left chest tube remains in place.  No definite pneumothorax on the left.  Apical emphysema is present.  Large blebs are present in the right apex and right lung base without pneumothorax.  Bibasilar airspace opacity has improved since the  prior study. This may be due to resolving pneumonia or edema.  Fracture left 7th rib laterally appears acute  IMPRESSION: Improvement in bibasilar airspace disease.  No definite pneumothorax.  Original Report Authenticated By: Camelia Phenes, M.D.    Assessment and Plan:  Spontaneous Left Pneumothorax with bullous emphysema Hx of previous L & R PTX with chest tubes.  L CT placed per Dr. [name] on 5/10.  5/13 with recurrent ptx after water seal.  Second CT placed by CT surgery.  Currently cxr is stable, and no obvious leak on water seal.    Plan:  -s/p VATS pleurodesis 5/16>>okay for d/c home per TCTS -d/c oxygen since sats are stable.    COPD (chronic obstructive pulmonary disease)  Active smoking PTA, no exacerbation in house.  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  Stable for d/c home.  Will need outpt f/u with Tammy Parrett in one week, and then f/u with me  after that.  TCTS to arrange f/u with Dr. Laneta Simmers to remove stitches in one week.  [name], MD 09/26/2011, 10:42 AM Pager:  [phone]

## 2011-09-26 NOTE — Progress Notes (Signed)
Pt's O2 sats down to 84% on room air.  Pt placed on 2L nasal cannula, O2 sats now 92%.  Roselie Awkward, RN

## 2011-09-28 ENCOUNTER — Encounter (HOSPITAL_COMMUNITY): Payer: Self-pay

## 2011-10-01 ENCOUNTER — Emergency Department (HOSPITAL_COMMUNITY): Payer: Medicare Other

## 2011-10-01 ENCOUNTER — Emergency Department (HOSPITAL_COMMUNITY)
Admission: EM | Admit: 2011-10-01 | Discharge: 2011-10-01 | Disposition: A | Discharge status: Home / Self Care | Payer: Medicare Other | Attending: Emergency Medicine | Admitting: Emergency Medicine

## 2011-10-01 DIAGNOSIS — J189 Pneumonia, unspecified organism: Secondary | ICD-10-CM

## 2011-10-01 DIAGNOSIS — J4489 Other specified chronic obstructive pulmonary disease: Secondary | ICD-10-CM | POA: Insufficient documentation

## 2011-10-01 DIAGNOSIS — Z79899 Other long term (current) drug therapy: Secondary | ICD-10-CM | POA: Insufficient documentation

## 2011-10-01 DIAGNOSIS — J449 Chronic obstructive pulmonary disease, unspecified: Secondary | ICD-10-CM | POA: Insufficient documentation

## 2011-10-01 DIAGNOSIS — F17200 Nicotine dependence, unspecified, uncomplicated: Secondary | ICD-10-CM | POA: Insufficient documentation

## 2011-10-01 DIAGNOSIS — R079 Chest pain, unspecified: Secondary | ICD-10-CM | POA: Insufficient documentation

## 2011-10-01 DIAGNOSIS — Z8571 Personal history of Hodgkin lymphoma: Secondary | ICD-10-CM | POA: Insufficient documentation

## 2011-10-01 DIAGNOSIS — R0602 Shortness of breath: Secondary | ICD-10-CM | POA: Insufficient documentation

## 2011-10-01 DIAGNOSIS — E079 Disorder of thyroid, unspecified: Secondary | ICD-10-CM | POA: Insufficient documentation

## 2011-10-01 LAB — DIFFERENTIAL
Basophils Absolute: 0.1 10*3/uL (ref 0.0–0.1)
Eosinophils Absolute: 0.7 10*3/uL (ref 0.0–0.7)
Lymphocytes Relative: 16 % (ref 12–46)
Lymphs Abs: 2.2 10*3/uL (ref 0.7–4.0)
Monocytes Relative: 14 % — ABNORMAL HIGH (ref 3–12)
Neutro Abs: 8.9 10*3/uL — ABNORMAL HIGH (ref 1.7–7.7)

## 2011-10-01 LAB — CBC
HCT: 40.8 % (ref 39.0–52.0)
MCHC: 33.3 g/dL (ref 30.0–36.0)
Platelets: 487 10*3/uL — ABNORMAL HIGH (ref 150–400)
RDW: 14.5 % (ref 11.5–15.5)
WBC: 13.8 10*3/uL — ABNORMAL HIGH (ref 4.0–10.5)

## 2011-10-01 LAB — COMPREHENSIVE METABOLIC PANEL
Albumin: 2.7 g/dL — ABNORMAL LOW (ref 3.5–5.2)
BUN: 16 mg/dL (ref 6–23)
Calcium: 8.8 mg/dL (ref 8.4–10.5)
GFR calc Af Amer: >90 mL/min (ref >90)
Glucose, Bld: 107 mg/dL — ABNORMAL HIGH (ref 70–99)
Sodium: 136 mEq/L (ref 135–145)
Total Protein: 6.5 g/dL (ref 6.0–8.3)

## 2011-10-01 LAB — POCT I-STAT TROPONIN I: Troponin i, poc: 0 ng/mL (ref 0.00–0.08)

## 2011-10-01 MED ORDER — ALBUTEROL SULFATE (5 MG/ML) 0.5% IN NEBU
INHALATION_SOLUTION | RESPIRATORY_TRACT | Status: AC
  Filled 2011-10-01: qty 1

## 2011-10-01 MED ORDER — KETOROLAC TROMETHAMINE 30 MG/ML IJ SOLN
30.0000 mg | Freq: Once | INTRAMUSCULAR | Status: AC
  Administered 2011-10-01: 30 mg via INTRAVENOUS
  Filled 2011-10-01: qty 1

## 2011-10-01 MED ORDER — SODIUM CHLORIDE 0.9 % IV BOLUS (SEPSIS)
500.0000 mL | Freq: Once | INTRAVENOUS | Status: AC
  Administered 2011-10-01: 500 mL via INTRAVENOUS

## 2011-10-01 MED ORDER — LEVOFLOXACIN 500 MG PO TABS: 500.0000 mg | ORAL_TABLET | Freq: Every day | ORAL | Status: AC

## 2011-10-01 NOTE — ED Notes (Signed)
PT has had SOB, and CP x 1 week since having chest tube insertion, due to spontaneous pneumothorax. Pt went to restroom and SOB and CP worse than normal. Relived by nothing. Per EMS lung sounds diminished with wheezing. Pt administered breathing treatment by EMS prior to arrival. PT states partial relief with treatment. 18 ga left AC.

## 2011-10-01 NOTE — ED Notes (Signed)
Pt states SOB and CP  X 1 week. Symptoms worse today right sided CP, that radiates to left side and to right side shoulder. Pain exacerbated by deep breath. Lungs sounds diminished

## 2011-10-01 NOTE — Discharge Instructions (Signed)
Chest Pain (Nonspecific) It is often hard to give a specific diagnosis for the cause of chest pain. There is always a chance that your pain could be related to something serious, such as a heart attack or a blood clot in the lungs. You need to follow up with your caregiver for further evaluation. CAUSES   Heartburn.   Pneumonia or bronchitis.   Anxiety or stress.   Inflammation around your heart (pericarditis) or lung (pleuritis or pleurisy).   A blood clot in the lung.   A collapsed lung (pneumothorax). It can develop suddenly on its own (spontaneous pneumothorax) or from injury (trauma) to the chest.   Shingles infection (herpes zoster virus).  The chest wall is composed of bones, muscles, and cartilage. Any of these can be the source of the pain.  The bones can be bruised by injury.   The muscles or cartilage can be strained by coughing or overwork.   The cartilage can be affected by inflammation and become sore (costochondritis).  DIAGNOSIS  Lab tests or other studies, such as X-rays, electrocardiography, stress testing, or cardiac imaging, may be needed to find the cause of your pain.  TREATMENT   Treatment depends on what may be causing your chest pain. Treatment may include:   Acid blockers for heartburn.   Anti-inflammatory medicine.   Pain medicine for inflammatory conditions.   Antibiotics if an infection is present.   You may be advised to change lifestyle habits. This includes stopping smoking and avoiding alcohol, caffeine, and chocolate.   You may be advised to keep your head raised (elevated) when sleeping. This reduces the chance of acid going backward from your stomach into your esophagus.   Most of the time, nonspecific chest pain will improve within 2 to 3 days with rest and mild pain medicine.  HOME CARE INSTRUCTIONS   If antibiotics were prescribed, take your antibiotics as directed. Finish them even if you start to feel better.   For the next few  days, avoid physical activities that bring on chest pain. Continue physical activities as directed.   Do not smoke.   Avoid drinking alcohol.   Only take over-the-counter or prescription medicine for pain, discomfort, or fever as directed by your caregiver.   Follow your caregiver's suggestions for further testing if your chest pain does not go away.   Keep any follow-up appointments you made. If you do not go to an appointment, you could develop lasting (chronic) problems with pain. If there is any problem keeping an appointment, you must call to reschedule.  SEEK MEDICAL CARE IF:   You think you are having problems from the medicine you are taking. Read your medicine instructions carefully.   Your chest pain does not go away, even after treatment.   You develop a rash with blisters on your chest.  SEEK IMMEDIATE MEDICAL CARE IF:   You have increased chest pain or pain that spreads to your arm, neck, jaw, back, or abdomen.   You develop shortness of breath, an increasing cough, or you are coughing up blood.   You have severe back or abdominal pain, feel nauseous, or vomit.   You develop severe weakness, fainting, or chills.   You have a fever.  THIS IS AN EMERGENCY. Do not wait to see if the pain will go away. Get medical help at once. Call your local emergency services (911 in [place] Do not drive yourself to the hospital. MAKE SURE YOU:   Understand these instructions.     Will watch your condition.   Will get help right away if you are not doing well or get worse.  Document Released: 02/02/2005 Document Revised: 04/14/2011 Document Reviewed: 11/29/2007 [employer] Patient Information 2012 [employer]. 

## 2011-10-01 NOTE — ED Provider Notes (Signed)
History     CSN: [id number]  Arrival date & time 10/01/11  0407   First MD Initiated Contact with Patient 10/01/11 0503      Chief Complaint  Patient presents with  . Shortness of Breath  . Chest Pain    (Consider location/radiation/quality/duration/timing/severity/associated sxs/prior treatment) HPI Pt had PTX and chest tube placed. Was d/c 1 week ago. Has had left-sided chest pain since. Pt had worsening SOB and chest pain yesterday which has significantly improved. No fever, chills. + cough productive of white sputum. No leg swelling or pain.  Past Medical History  Diagnosis Date  . Thyroid disease     hyper, not on meds  . COPD (chronic obstructive pulmonary disease)   . Hodgkin's disease 1993    with abdominal surgical scar -pt unable to describe   . Spontaneous pneumothorax     left  . Spontaneous pneumothorax     right    Past Surgical History  Procedure Date  . Splenectomy   . Chest tube insertion     left  . Chest tube insertion     right    No family history on file.  History  Substance Use Topics  . Smoking status: Current Everyday Smoker -- 2.0 packs/day for 41 years    Types: Cigarettes  . Smokeless tobacco: Not on file  . Alcohol Use: 25.2 oz/week    42 Cans of beer per week      Review of Systems  Constitutional: Negative for fever and chills.  Respiratory: Positive for shortness of breath. Negative for wheezing.   Cardiovascular: Positive for chest pain.  Gastrointestinal: Negative for nausea, vomiting and abdominal pain.  Musculoskeletal: Negative for back pain.  Neurological: Negative for weakness and numbness.    Allergies  Codeine and Oxycodone  Home Medications   Current Outpatient Rx  Name Route Sig Dispense Refill  . ALBUTEROL SULFATE HFA 108 (90 BASE) MCG/ACT IN AERS Inhalation Inhale 2 puffs into the lungs every 6 (six) hours as needed. wheezing    . BUDESONIDE-FORMOTEROL FUMARATE 160-4.5 MCG/ACT IN AERO Inhalation Inhale  2 puffs into the lungs 2 (two) times daily.    . OXYCODONE HCL 5 MG PO TABS Oral Take 10 mg by mouth every 4 (four) hours as needed. For pain    . FOLIC ACID 1 MG PO TABS Oral Take 1 tablet (1 mg total) by mouth daily.    Bernadette Hoit SODIUM 8.6-50 MG PO TABS Oral Take 1 tablet by mouth 2 (two) times daily.    . THIAMINE HCL 100 MG PO TABS Oral Take 1 tablet (100 mg total) by mouth daily. 30 tablet 0    BP 128/72  Pulse 104  Temp(Src) 98.3 F (36.8 C) (Oral)  Resp 20  SpO2 97%  Physical Exam  Nursing note and vitals reviewed. Constitutional: He is oriented to person, place, and time. He appears well-developed and well-nourished. No distress.  HENT:  Head: Normocephalic and atraumatic.  Mouth/Throat: Oropharynx is clear and moist.  Eyes: EOM are normal. Pupils are equal, round, and reactive to light.  Neck: Normal range of motion. Neck supple.  Cardiovascular: Normal rate and regular rhythm.   Pulmonary/Chest: Effort normal and breath sounds normal. No respiratory distress. He has no wheezes. He has no rales. He exhibits no tenderness.  Abdominal: Soft. Bowel sounds are normal. There is no tenderness. There is no rebound and no guarding.  Musculoskeletal: Normal range of motion. He exhibits no edema and no tenderness.  Neurological: He is alert and oriented to person, place, and time.       5/5 motor, sensation intact.   Skin: Skin is warm and dry. No rash noted. No erythema.  Psychiatric: He has a normal mood and affect. His behavior is normal.    ED Course  Procedures (including critical care time)  Labs Reviewed  CBC - Abnormal; Notable for the following:    WBC 13.8 (*)    Platelets 487 (*)    All other components within normal limits  DIFFERENTIAL - Abnormal; Notable for the following:    Monocytes Relative 14 (*)    Neutro Abs 8.9 (*)    Monocytes Absolute 1.9 (*)    All other components within normal limits  COMPREHENSIVE METABOLIC PANEL - Abnormal; Notable  for the following:    Glucose, Bld 107 (*)    Albumin 2.7 (*)    Total Bilirubin 0.2 (*)    All other components within normal limits  POCT I-STAT TROPONIN I   Dg Chest 2 View  10/01/2011  *RADIOLOGY REPORT*  Clinical Data: Chest pain and shortness of breath.  History of collapse lung last week on the left side.  CHEST - 2 VIEW  Comparison: 09/26/2011  Findings: Normal heart size and pulmonary vascularity. Emphysematous changes and scattered fibrosis in the lungs.  Bullous emphysema in the right apex and right lung base.  Fibrosis or atelectasis in the lung bases.  Blunting of the costophrenic angles suggesting small pleural effusions.  Changes appear stable since the previous study.  No focal consolidation.  Old and healing left rib fractures.  No pneumothorax.  Surgical clips in the left upper quadrant. Old healed left clavicular fracture.  IMPRESSION: Stable appearance since previous study.  Diffuse emphysema and scattered fibrosis.  Bilateral pleural effusions with atelectasis or scarring in the lung bases. Old left rib fractures.  No significant interval change.  Original Report Authenticated By: Marlon Pel, M.D.     1. Chest pain   2. Recurrent pneumonia       MDM   Questionable recurrent pneumonia. Given elevated WBC and cough will treat with 10 days of levaquin and have f/u with PMD.        Loren Racer, MD 10/01/11 [phone]

## 2011-10-04 ENCOUNTER — Other Ambulatory Visit: Payer: Self-pay | Admitting: *Deleted

## 2011-10-04 DIAGNOSIS — G8918 Other acute postprocedural pain: Secondary | ICD-10-CM

## 2011-10-04 MED ORDER — HYDROCODONE-ACETAMINOPHEN 7.5-500 MG PO TABS: 1.0000 | ORAL_TABLET | ORAL | Status: AC | PRN

## 2011-10-10 ENCOUNTER — Inpatient Hospital Stay: Payer: Medicare Other | Admitting: Adult Health

## 2011-10-10 ENCOUNTER — Other Ambulatory Visit: Payer: Self-pay | Admitting: Surgery

## 2011-10-10 DIAGNOSIS — J930 Spontaneous tension pneumothorax: Secondary | ICD-10-CM

## 2011-10-11 ENCOUNTER — Other Ambulatory Visit: Payer: Self-pay

## 2011-10-11 ENCOUNTER — Ambulatory Visit
Admission: RE | Admit: 2011-10-11 | Discharge: 2011-10-11 | Disposition: A | Discharge status: Home / Self Care | Payer: Medicare Other | Source: Ambulatory Visit | Attending: Surgery | Admitting: Surgery

## 2011-10-11 ENCOUNTER — Other Ambulatory Visit: Payer: Self-pay | Admitting: Surgery

## 2011-10-11 ENCOUNTER — Encounter: Payer: Medicare Other | Admitting: Surgery

## 2011-10-11 DIAGNOSIS — G8918 Other acute postprocedural pain: Secondary | ICD-10-CM

## 2011-10-11 DIAGNOSIS — J930 Spontaneous tension pneumothorax: Secondary | ICD-10-CM

## 2011-10-12 ENCOUNTER — Encounter: Payer: Self-pay | Admitting: Surgery

## 2011-10-12 ENCOUNTER — Ambulatory Visit (INDEPENDENT_AMBULATORY_CARE_PROVIDER_SITE_OTHER): Payer: Self-pay | Admitting: Surgery

## 2011-10-12 VITALS — BP 100/66 | HR 103 | Resp 18 | Ht 67.0 in | Wt 108.0 lb

## 2011-10-12 DIAGNOSIS — J9383 Other pneumothorax: Secondary | ICD-10-CM

## 2011-10-12 DIAGNOSIS — J939 Pneumothorax, unspecified: Secondary | ICD-10-CM

## 2011-10-12 DIAGNOSIS — Z09 Encounter for follow-up examination after completed treatment for conditions other than malignant neoplasm: Secondary | ICD-10-CM

## 2011-10-12 DIAGNOSIS — J439 Emphysema, unspecified: Secondary | ICD-10-CM

## 2011-10-13 ENCOUNTER — Encounter: Payer: Self-pay | Admitting: Surgery

## 2011-10-13 NOTE — Progress Notes (Signed)
  HPI:  Patient returns for routine postoperative follow-up having undergone left VATS with stapling of blebs and mechanical pleurodesis on 09/22/2011. The patient's early postoperative recovery while in the hospital was notable for an uncomplicated postoperative course. Since hospital discharge the patient reports he has been feeling well overall..   Current Outpatient Prescriptions  Medication Sig Dispense Refill  . albuterol (PROVENTIL HFA;VENTOLIN HFA) 108 (90 BASE) MCG/ACT inhaler Inhale 2 puffs into the lungs every 6 (six) hours as needed. wheezing      . budesonide-formoterol (SYMBICORT) 160-4.5 MCG/ACT inhaler Inhale 2 puffs into the lungs 2 (two) times daily.      . folic acid (FOLVITE) 1 MG tablet Take 1 tablet (1 mg total) by mouth daily.      [name] HYDROcodone-acetaminophen (LORTAB 7.5) 7.5-500 MG per tablet Take 1 tablet by mouth every 4 (four) hours as needed for pain (may take one or two tabs every 4-6 hrs prn pain...no more than 8 tabs per day/).  40 tablet  0  . senna-docusate (SENOKOT-S) 8.6-50 MG per tablet Take 1 tablet by mouth 2 (two) times daily.      [name] thiamine 100 MG tablet Take 1 tablet (100 mg total) by mouth daily.  30 tablet  0    Physical Exam: BP 100/66  Pulse 103  Resp 18  Ht 5\' 7"  (1.702 m)  Wt 108 lb (48.988 kg)  BMI 16.92 kg/m2  SpO2 92% He looks well but is in his usual disheveled state. Lung exam is clear. The chest incision is healing well.   Diagnostic Tests:  Clinical Data: Follow up of pneumothorax   CHEST - 2 VIEW   Comparison: Chest x-ray of 09/30/2013   Findings: No pneumothorax is seen.  Again severe changes of COPD are noted with diffuse bullous formation. Areas of pulmonary fibrosis are noted bilaterally.  There may still a tiny pleural effusions present.  Heart size is stable.  An old healed left mid clavicular fracture is noted.  Right apical pleuroparenchymal scarring appears stable.   IMPRESSION: No pneumothorax.  COPD.   Probable tiny pleural effusions.   Original Report Authenticated By: Juline Patch, M.D.   Impression:  He is doing well following his procedure for recurrent spontaneous left pneumothorax. I told him he can gradually increase his activity as tolerated but he should refrain from lifting anything heavy for about 3 months postoperatively.  Plan:  He will continue followup with his pulmonary physician and will contact our office if he develops any recurrent shortness of breath or chest pain similar to what he had with his prior pneumothorax.

## 2011-10-18 ENCOUNTER — Other Ambulatory Visit: Payer: Self-pay | Admitting: Surgery

## 2011-10-18 DIAGNOSIS — G8918 Other acute postprocedural pain: Secondary | ICD-10-CM

## 2011-10-22 ENCOUNTER — Other Ambulatory Visit: Payer: Self-pay | Admitting: Surgery

## 2011-10-24 ENCOUNTER — Other Ambulatory Visit: Payer: Self-pay | Admitting: *Deleted

## 2011-10-24 DIAGNOSIS — G8918 Other acute postprocedural pain: Secondary | ICD-10-CM

## 2011-10-24 MED ORDER — HYDROCODONE-ACETAMINOPHEN 7.5-500 MG PO TABS: 1.0000 | ORAL_TABLET | ORAL | Status: DC | PRN

## 2011-10-26 ENCOUNTER — Inpatient Hospital Stay: Payer: Medicare Other | Admitting: Adult Health

## 2011-11-03 ENCOUNTER — Other Ambulatory Visit: Payer: Self-pay | Admitting: Surgery

## 2011-11-03 DIAGNOSIS — G8918 Other acute postprocedural pain: Secondary | ICD-10-CM

## 2011-11-09 ENCOUNTER — Other Ambulatory Visit: Payer: Self-pay | Admitting: *Deleted

## 2011-11-09 ENCOUNTER — Other Ambulatory Visit: Payer: Self-pay | Admitting: Surgery

## 2011-11-09 DIAGNOSIS — G8918 Other acute postprocedural pain: Secondary | ICD-10-CM

## 2011-11-09 MED ORDER — HYDROCODONE-ACETAMINOPHEN 5-500 MG PO TABS: 1.0000 | ORAL_TABLET | Freq: Four times a day (QID) | ORAL | Status: DC | PRN

## 2011-11-15 ENCOUNTER — Other Ambulatory Visit: Payer: Self-pay | Admitting: *Deleted

## 2011-11-15 DIAGNOSIS — G8918 Other acute postprocedural pain: Secondary | ICD-10-CM

## 2011-11-15 MED ORDER — HYDROCODONE-ACETAMINOPHEN 7.5-500 MG PO TABS: 1.0000 | ORAL_TABLET | Freq: Four times a day (QID) | ORAL | Status: DC | PRN

## 2011-11-15 NOTE — Telephone Encounter (Signed)
Philip Coleman had surgery in May of this year d/t a a left ptx.  Since the day after surgery he has had right shoulder discomfort.  He said he told Dr. Laneta Simmers about this at his f/u appt.  When his pain med strength was decreased, he called to relate that was why he was still needing his pain med.  He says he is going to see an orthopedist soon to see what is wrong with his shoulder.  I offered to give him a f/u with Dr. Laneta Simmers but he said that was not necessary.

## 2011-11-20 ENCOUNTER — Emergency Department (HOSPITAL_COMMUNITY)
Admission: EM | Admit: 2011-11-20 | Discharge: 2011-11-21 | Disposition: A | Discharge status: Home / Self Care | Payer: Medicare Other | Attending: Emergency Medicine | Admitting: Emergency Medicine

## 2011-11-20 ENCOUNTER — Encounter (HOSPITAL_COMMUNITY): Payer: Self-pay | Admitting: Emergency Medicine

## 2011-11-20 DIAGNOSIS — R062 Wheezing: Secondary | ICD-10-CM | POA: Insufficient documentation

## 2011-11-20 DIAGNOSIS — M25519 Pain in unspecified shoulder: Secondary | ICD-10-CM | POA: Insufficient documentation

## 2011-11-20 DIAGNOSIS — F17200 Nicotine dependence, unspecified, uncomplicated: Secondary | ICD-10-CM | POA: Insufficient documentation

## 2011-11-20 DIAGNOSIS — R079 Chest pain, unspecified: Secondary | ICD-10-CM | POA: Insufficient documentation

## 2011-11-20 DIAGNOSIS — J449 Chronic obstructive pulmonary disease, unspecified: Secondary | ICD-10-CM

## 2011-11-20 DIAGNOSIS — Z79899 Other long term (current) drug therapy: Secondary | ICD-10-CM | POA: Insufficient documentation

## 2011-11-20 DIAGNOSIS — F1010 Alcohol abuse, uncomplicated: Secondary | ICD-10-CM | POA: Insufficient documentation

## 2011-11-20 DIAGNOSIS — J4489 Other specified chronic obstructive pulmonary disease: Secondary | ICD-10-CM | POA: Insufficient documentation

## 2011-11-20 MED ORDER — PREDNISONE 20 MG PO TABS
60.0000 mg | ORAL_TABLET | Freq: Once | ORAL | Status: AC
  Administered 2011-11-21: 60 mg via ORAL
  Filled 2011-11-20: qty 3

## 2011-11-20 MED ORDER — IPRATROPIUM BROMIDE 0.02 % IN SOLN
0.5000 mg | Freq: Once | RESPIRATORY_TRACT | Status: AC
  Administered 2011-11-21: 0.5 mg via RESPIRATORY_TRACT
  Filled 2011-11-20: qty 2.5

## 2011-11-20 MED ORDER — ALBUTEROL SULFATE (5 MG/ML) 0.5% IN NEBU
5.0000 mg | INHALATION_SOLUTION | Freq: Once | RESPIRATORY_TRACT | Status: AC
  Administered 2011-11-21: 5 mg via RESPIRATORY_TRACT
  Filled 2011-11-20: qty 1

## 2011-11-20 NOTE — ED Notes (Signed)
Pt reports having SOB and L scapula pain onset since Friday past hx smoker

## 2011-11-20 NOTE — ED Notes (Addendum)
Patient complaining of shortness of breath; patient reports that he has a history of shortness of breath, but that it has become worse during the last three days.  Patient reports that shortness of breath worsens upon exertion.  Patient reports chest pain on the right side and right shoulder blade pain -- patient states that pain has been here since he was last discharged from the hospital in May.  Patient reports history of chest tube due to pneumothorax in May.  Patient has recurrent history of pneumothorax -- patient reports that pneumothorax was on left side.  Dr. Radford Pax at bedside with ultrasound machine.  Patient reports that he is on oxygen (1 liter) at home, but that he is trying to "ween" himself off the oxygen.  Patient alert and oriented x4; PERRL present.  Upon arrival to room, patient changed into gown and connected to continuous cardiac, pulse ox, and blood pressure monitor.  Patient reports ETOH on board.  Will continue to monitor.

## 2011-11-20 NOTE — ED Provider Notes (Signed)
History     CSN: [id number]  Arrival date & time 11/20/11  2325   First MD Initiated Contact with Patient 11/20/11 2347      Chief Complaint  Patient presents with  . Chest Pain  . Shortness of Breath    HPI Patient complaining of shortness of breath; patient reports that he has a history of shortness of breath, but that it has become worse during the last three days. Patient reports that shortness of breath worsens upon exertion. Patient reports chest pain on the right side and right shoulder blade pain -- patient states that pain has been here since he was last discharged from the hospital in May. Patient reports history of chest tube due to pneumothorax in May. Patient has recurrent history of pneumothorax -- patient reports that pneumothorax was on left side.. Patient reports that he is on oxygen (1 liter) at home, but that he is trying to "ween" himself off the oxygen. Patient alert and oriented x4; PERRL present. Upon arrival to room, patient changed into gown and connected to continuous cardiac, pulse ox, and blood pressure monitor. Patient reports ETOH on board. monitor.  Past Medical History  Diagnosis Date  . Thyroid disease     hyper, not on meds  . COPD (chronic obstructive pulmonary disease)   . Hodgkin's disease 1993    with abdominal surgical scar -pt unable to describe   . Spontaneous pneumothorax     left  . Spontaneous pneumothorax     right    Past Surgical History  Procedure Date  . Splenectomy   . Chest tube insertion     left  . Chest tube insertion     right    History reviewed. No pertinent family history.  History  Substance Use Topics  . Smoking status: Current Everyday Smoker -- 2.0 packs/day for 41 years    Types: Cigarettes  . Smokeless tobacco: Not on file  . Alcohol Use: 25.2 oz/week    42 Cans of beer per week      Review of Systems  All other systems reviewed and are negative.    Allergies  Codeine  Home Medications    Current Outpatient Rx  Name Route Sig Dispense Refill  . ALBUTEROL SULFATE HFA 108 (90 BASE) MCG/ACT IN AERS Inhalation Inhale 2 puffs into the lungs every 6 (six) hours as needed. wheezing    . BUDESONIDE-FORMOTEROL FUMARATE 160-4.5 MCG/ACT IN AERO Inhalation Inhale 2 puffs into the lungs 2 (two) times daily.    [name] HYDROCODONE-ACETAMINOPHEN 7.5-500 MG PO TABS Oral Take 1 tablet by mouth every 6 (six) hours as needed for pain (may take one or two tablets every 4-6 hrs prn..not to exceed 8 tabs per day). 40 tablet 0  . PREDNISONE 20 MG PO TABS Oral Take 3 tablets (60 mg total) by mouth once. 15 tablet 0  . TRAMADOL HCL 50 MG PO TABS Oral Take 1 tablet (50 mg total) by mouth every 6 (six) hours as needed for pain. 15 tablet 0    BP 121/72  Pulse 87  Temp 98.1 F (36.7 C)  Resp 12  SpO2 97%  Physical Exam  Nursing note and vitals reviewed. Constitutional: He is oriented to person, place, and time. He appears well-developed and well-nourished. No distress.  HENT:  Head: Normocephalic and atraumatic.  Eyes: Pupils are equal, round, and reactive to light.  Neck: Normal range of motion.  Cardiovascular: Normal rate and intact distal pulses.  Sinus rhythm with short PR Rate=93 Right atrial enlargement Borderline ECG  Pulmonary/Chest: No respiratory distress. He has decreased breath sounds. He has wheezes.  Abdominal: Normal appearance. He exhibits no distension.  Musculoskeletal: Normal range of motion.  Neurological: He is alert and oriented to person, place, and time. No cranial nerve deficit.  Skin: Skin is warm and dry. No rash noted.  Psychiatric: He has a normal mood and affect. His behavior is normal.    ED Course  Procedures (including critical care time) Scheduled Meds:    . albuterol  5 mg Nebulization Once  . fentaNYL  50 mcg Intravenous Once  . ipratropium  0.5 mg Nebulization Once  . predniSONE  60 mg Oral Once   Continuous Infusions:  PRN Meds:.  Labs  Reviewed - No data to display Dg Chest 2 View  11/21/2011  *RADIOLOGY REPORT*  Clinical Data: Shortness of breath and right chest pain  CHEST - 2 VIEW  Comparison: 10/11/2011  Findings: Severe hyperlucency and hyperinflation compatible with emphysematous change again noted.  Prominent right first costochondral junction again noted.  Left upper quadrant abdominal clips are in place.  No new focal pulmonary opacity.  No pneumothorax.  No pleural effusion.  Heart size is normal.  No acute osseous finding. Remote left posterior third and fourth rib fractures and right lateral tenth rib fractures are again noted.  IMPRESSION: Stable changes of severe emphysema without focal acute finding.  Original Report Authenticated By: Harrel Lemon, M.D.     1. COPD (chronic obstructive pulmonary disease)   2. ETOH abuse   3. Shoulder pain       MDM          Nelia Shi, MD 11/21/11 0120

## 2011-11-21 ENCOUNTER — Emergency Department (HOSPITAL_COMMUNITY): Payer: Medicare Other

## 2011-11-21 MED ORDER — PREDNISONE 20 MG PO TABS: 60.0000 mg | ORAL_TABLET | Freq: Once | ORAL | Status: AC

## 2011-11-21 MED ORDER — FENTANYL CITRATE 0.05 MG/ML IJ SOLN
50.0000 ug | Freq: Once | INTRAMUSCULAR | Status: AC
  Administered 2011-11-21: 50 ug via INTRAVENOUS
  Filled 2011-11-21: qty 2

## 2011-11-21 MED ORDER — TRAMADOL HCL 50 MG PO TABS: 50.0000 mg | ORAL_TABLET | Freq: Four times a day (QID) | ORAL | Status: AC | PRN

## 2011-11-21 NOTE — ED Notes (Signed)
Pt. Reports feeling SOB x 3 days. Reports using home O2 x 3 days on 1L. Placed on 2L [place] SpO2 95%. Denies any injury to chest. Denies pain.Vitals stable.  Resting comfortably on stretcher. A.O.x 4. NAD.

## 2011-11-21 NOTE — ED Notes (Signed)
Patient given discharge paperwork; went over discharge instructions with patient.  Patient instructed to follow up with primary care physician, to take Ultram and Prednisone as directed, and to return to the ED for new, worsening, or concerning symptoms.

## 2011-11-21 NOTE — ED Notes (Signed)
Called respiratory to administer nebulizer treatment; respiratory tech states that they are on their way.

## 2011-11-21 NOTE — ED Notes (Signed)
Respiratory at bedside administering nebulizer treatment

## 2011-11-21 NOTE — ED Notes (Signed)
Pt. Resting comfortably on stretcher watching TV. A.O. X 4. NAD. Vitals stable.

## 2011-11-23 ENCOUNTER — Other Ambulatory Visit: Payer: Self-pay | Admitting: *Deleted

## 2011-11-23 DIAGNOSIS — G8918 Other acute postprocedural pain: Secondary | ICD-10-CM

## 2011-11-23 MED ORDER — HYDROCODONE-ACETAMINOPHEN 7.5-500 MG PO TABS: 1.0000 | ORAL_TABLET | Freq: Four times a day (QID) | ORAL | Status: DC | PRN

## 2011-11-28 ENCOUNTER — Telehealth: Payer: Self-pay | Admitting: Pulmonary Disease

## 2011-11-28 NOTE — Telephone Encounter (Signed)
SPOKE WITH PT .REQUSTING RX FOR HYDROCODONE LAST FILLED ON 11-23-11  BY DR BARTLE #[id number]. ADVISED PT HE NEEDS TO CALL DR BARTLES OFFICE FOR  REFILLS.  PT VERBALLY UNDERSTOOD NOTHING FURTHER WAS NEEDED.

## 2011-11-30 ENCOUNTER — Other Ambulatory Visit: Payer: Self-pay

## 2011-11-30 DIAGNOSIS — G8918 Other acute postprocedural pain: Secondary | ICD-10-CM

## 2011-11-30 MED ORDER — HYDROCODONE-ACETAMINOPHEN 7.5-500 MG PO TABS: 1.0000 | ORAL_TABLET | Freq: Four times a day (QID) | ORAL | Status: AC | PRN

## 2011-11-30 NOTE — Telephone Encounter (Signed)
RX refill called to [facility] Lortab 7.5/500 mg 1 po every 6 hours prn pain No refills

## 2011-12-05 ENCOUNTER — Emergency Department (HOSPITAL_COMMUNITY)
Admission: EM | Admit: 2011-12-05 | Discharge: 2011-12-05 | Disposition: A | Discharge status: Home / Self Care | Payer: Medicare Other | Attending: Emergency Medicine | Admitting: Emergency Medicine

## 2011-12-05 ENCOUNTER — Encounter (HOSPITAL_COMMUNITY): Payer: Self-pay | Admitting: Emergency Medicine

## 2011-12-05 ENCOUNTER — Emergency Department (HOSPITAL_COMMUNITY): Payer: Medicare Other

## 2011-12-05 ENCOUNTER — Other Ambulatory Visit: Payer: Self-pay

## 2011-12-05 DIAGNOSIS — R0989 Other specified symptoms and signs involving the circulatory and respiratory systems: Secondary | ICD-10-CM | POA: Insufficient documentation

## 2011-12-05 DIAGNOSIS — M25519 Pain in unspecified shoulder: Secondary | ICD-10-CM | POA: Insufficient documentation

## 2011-12-05 DIAGNOSIS — R079 Chest pain, unspecified: Secondary | ICD-10-CM | POA: Insufficient documentation

## 2011-12-05 DIAGNOSIS — J4489 Other specified chronic obstructive pulmonary disease: Secondary | ICD-10-CM

## 2011-12-05 DIAGNOSIS — R0609 Other forms of dyspnea: Secondary | ICD-10-CM | POA: Insufficient documentation

## 2011-12-05 DIAGNOSIS — J449 Chronic obstructive pulmonary disease, unspecified: Secondary | ICD-10-CM | POA: Insufficient documentation

## 2011-12-05 DIAGNOSIS — F17200 Nicotine dependence, unspecified, uncomplicated: Secondary | ICD-10-CM | POA: Insufficient documentation

## 2011-12-05 LAB — POCT I-STAT 3, VENOUS BLOOD GAS (G3P V)
Acid-Base Excess: 4 mmol/L — ABNORMAL HIGH (ref 0.0–2.0)
Bicarbonate: 30.4 mEq/L — ABNORMAL HIGH (ref 20.0–24.0)
O2 Saturation: 51 %
TCO2: 32 mmol/L (ref 0–100)
pCO2, Ven: 50.2 mmHg — ABNORMAL HIGH (ref 45.0–50.0)
pH, Ven: 7.391 — ABNORMAL HIGH (ref 7.250–7.300)
pO2, Ven: 28 mmHg — CL (ref 30.0–45.0)

## 2011-12-05 LAB — CBC WITH DIFFERENTIAL/PLATELET
Basophils Absolute: 0.1 K/uL (ref 0.0–0.1)
Basophils Relative: 1 % (ref 0–1)
Eosinophils Absolute: 0.3 10*3/uL (ref 0.0–0.7)
Eosinophils Relative: 3 % (ref 0–5)
HCT: 51.1 % (ref 39.0–52.0)
Hemoglobin: 17.1 g/dL — ABNORMAL HIGH (ref 13.0–17.0)
Lymphocytes Relative: 30 % (ref 12–46)
Lymphs Abs: 3.4 10*3/uL (ref 0.7–4.0)
MCH: 30.1 pg (ref 26.0–34.0)
MCHC: 33.5 g/dL (ref 30.0–36.0)
MCV: 89.8 fL (ref 78.0–100.0)
Monocytes Absolute: 1.5 10*3/uL — ABNORMAL HIGH (ref 0.1–1.0)
Monocytes Relative: 13 % — ABNORMAL HIGH (ref 3–12)
Neutro Abs: 6 K/uL (ref 1.7–7.7)
Neutrophils Relative %: 54 % (ref 43–77)
Platelets: 302 10*3/uL (ref 150–400)
RBC: 5.69 MIL/uL (ref 4.22–5.81)
RDW: 15 % (ref 11.5–15.5)
WBC: 11.3 K/uL — ABNORMAL HIGH (ref 4.0–10.5)

## 2011-12-05 LAB — COMPREHENSIVE METABOLIC PANEL WITH GFR
AST: 30 U/L (ref 0–37)
Albumin: 4 g/dL (ref 3.5–5.2)
Alkaline Phosphatase: 90 U/L (ref 39–117)
Chloride: 100 meq/L (ref 96–112)
Potassium: 4.5 meq/L (ref 3.5–5.1)
Total Bilirubin: 0.4 mg/dL (ref 0.3–1.2)

## 2011-12-05 LAB — POCT I-STAT 3, ART BLOOD GAS (G3+)
Acid-Base Excess: 2 mmol/L (ref 0.0–2.0)
Bicarbonate: 26.6 mEq/L — ABNORMAL HIGH (ref 20.0–24.0)
O2 Saturation: 88 %
Patient temperature: 98.6
TCO2: 28 mmol/L (ref 0–100)
pCO2 arterial: 42.2 mmHg (ref 35.0–45.0)
pH, Arterial: 7.408 (ref 7.350–7.450)
pO2, Arterial: 55 mmHg — ABNORMAL LOW (ref 80.0–100.0)

## 2011-12-05 LAB — TROPONIN I: Troponin I: <0.3 ng/mL (ref <0.30)

## 2011-12-05 LAB — COMPREHENSIVE METABOLIC PANEL
ALT: 18 U/L (ref 0–53)
BUN: 8 mg/dL (ref 6–23)
CO2: 29 mEq/L (ref 19–32)
Calcium: 9.8 mg/dL (ref 8.4–10.5)
Creatinine, Ser: 0.8 mg/dL (ref 0.50–1.35)
GFR calc Af Amer: >90 mL/min (ref >90)
GFR calc non Af Amer: >90 mL/min (ref >90)
Glucose, Bld: 84 mg/dL (ref 70–99)
Sodium: 140 mEq/L (ref 135–145)
Total Protein: 7.9 g/dL (ref 6.0–8.3)

## 2011-12-05 MED ORDER — OXYCODONE-ACETAMINOPHEN 5-325 MG PO TABS: ORAL_TABLET | ORAL | Status: AC

## 2011-12-05 MED ORDER — IPRATROPIUM BROMIDE 0.02 % IN SOLN
0.5000 mg | Freq: Once | RESPIRATORY_TRACT | Status: DC
  Filled 2011-12-05: qty 2.5

## 2011-12-05 MED ORDER — KETOROLAC TROMETHAMINE 15 MG/ML IJ SOLN
15.0000 mg | Freq: Once | INTRAMUSCULAR | Status: DC
  Filled 2011-12-05: qty 1

## 2011-12-05 MED ORDER — AMOXICILLIN 500 MG PO CAPS: 500.0000 mg | ORAL_CAPSULE | Freq: Three times a day (TID) | ORAL | Status: AC

## 2011-12-05 MED ORDER — ALBUTEROL (5 MG/ML) CONTINUOUS INHALATION SOLN
10.0000 mg/h | INHALATION_SOLUTION | RESPIRATORY_TRACT | Status: DC
  Filled 2011-12-05: qty 0.5

## 2011-12-05 MED ORDER — KETOROLAC TROMETHAMINE 30 MG/ML IJ SOLN
INTRAMUSCULAR | Status: AC
  Administered 2011-12-05: 15 mg
  Filled 2011-12-05: qty 1

## 2011-12-05 MED ORDER — MORPHINE SULFATE 4 MG/ML IJ SOLN
4.0000 mg | Freq: Once | INTRAMUSCULAR | Status: AC
  Administered 2011-12-05: 4 mg via INTRAVENOUS
  Filled 2011-12-05: qty 1

## 2011-12-05 MED ORDER — PREDNISONE 20 MG PO TABS: 40.0000 mg | ORAL_TABLET | Freq: Every day | ORAL | Status: AC

## 2011-12-05 MED ORDER — ALBUTEROL SULFATE HFA 108 (90 BASE) MCG/ACT IN AERS
2.0000 | INHALATION_SPRAY | Freq: Four times a day (QID) | RESPIRATORY_TRACT | Status: DC | PRN
  Filled 2011-12-05: qty 6.7

## 2011-12-05 MED ORDER — METHYLPREDNISOLONE SODIUM SUCC 125 MG IJ SOLR
125.0000 mg | Freq: Once | INTRAMUSCULAR | Status: AC
  Administered 2011-12-05: 125 mg via INTRAVENOUS
  Filled 2011-12-05: qty 2

## 2011-12-05 NOTE — ED Provider Notes (Signed)
History     CSN: [id number]  Arrival date & time 12/05/11  1738   First MD Initiated Contact with Patient 12/05/11 2018      Chief Complaint  Patient presents with  . Chest Pain    (Consider location/radiation/quality/duration/timing/severity/associated sxs/prior treatment) HPI Comments: Pt with 2 prior spontaneous pneumothoraces in the past, has some chronic pain due to chest tube site and is on oxycodone at home, which he admits he is running low and so trying to space themn out, has had worsening pain in right side (not left where PTX has been) worse in AM, associated with SOB and some pleuritic pain.  Pain is more sharp, worse with deep breath or cough.  No fevers or sig change in cough.  Denies trauma.  No cold symptoms otherwise.  He has h/o COPD and still smoking as well.  SOB is worse with exertion.  No sweats, N/V/D.    Patient is a 56 y.o. male presenting with chest pain. The history is provided by the patient.  Chest Pain Primary symptoms include shortness of breath. Pertinent negatives for primary symptoms include no fever, no wheezing, no nausea and no vomiting.     Past Medical History  Diagnosis Date  . Thyroid disease     hyper, not on meds  . COPD (chronic obstructive pulmonary disease)   . Hodgkin's disease 1993    with abdominal surgical scar -pt unable to describe   . Spontaneous pneumothorax     left  . Spontaneous pneumothorax     right    Past Surgical History  Procedure Date  . Splenectomy   . Chest tube insertion     left  . Chest tube insertion     right    History reviewed. No pertinent family history.  History  Substance Use Topics  . Smoking status: Current Everyday Smoker -- 2.0 packs/day for 41 years    Types: Cigarettes  . Smokeless tobacco: Not on file  . Alcohol Use: 25.2 oz/week    42 Cans of beer per week      Review of Systems  Constitutional: Negative for fever and chills.  Respiratory: Positive for shortness of breath.  Negative for wheezing.   Cardiovascular: Positive for chest pain.  Gastrointestinal: Negative for nausea, vomiting and abdominal distention.  Musculoskeletal: Negative for back pain.  Neurological: Negative for headaches.    Allergies  Codeine  Home Medications   Current Outpatient Rx  Name Route Sig Dispense Refill  . ALBUTEROL SULFATE HFA 108 (90 BASE) MCG/ACT IN AERS Inhalation Inhale 2 puffs into the lungs every 6 (six) hours as needed. wheezing    . BUDESONIDE-FORMOTEROL FUMARATE 160-4.5 MCG/ACT IN AERO Inhalation Inhale 2 puffs into the lungs 2 (two) times daily.    [name] HYDROCODONE-ACETAMINOPHEN 7.5-500 MG PO TABS Oral Take 1 tablet by mouth every 6 (six) hours as needed for pain (may take one or two tablets every 4-6 hrs prn..not to exceed 8 tabs per day). 40 tablet 0  . AMOXICILLIN 500 MG PO CAPS Oral Take 1 capsule (500 mg total) by mouth 3 (three) times daily. 21 capsule 0  . OXYCODONE-ACETAMINOPHEN 5-325 MG PO TABS  1-2 tablets po q 6 hours prn moderate to severe pain 15 tablet 0  . PREDNISONE 20 MG PO TABS Oral Take 2 tablets (40 mg total) by mouth daily. 12 tablet 0    BP 107/62  Pulse 76  Temp 98.2 F (36.8 C) (Oral)  Resp 11  SpO2  90%  Physical Exam  Nursing note and vitals reviewed. Constitutional: He is oriented to person, place, and time. He appears well-developed and well-nourished.  HENT:  Head: Normocephalic and atraumatic.  Cardiovascular: Normal rate.   Pulmonary/Chest: He is in respiratory distress. He has decreased breath sounds. He has no wheezes. He has no rales. He exhibits no tenderness.       Diffuse decreased breath sounds throughout, very tight, not moving much air, accessory muscle use, tachypneic  Abdominal: Soft. He exhibits no distension. There is no tenderness.  Neurological: He is alert and oriented to person, place, and time.  Skin: Skin is warm and dry. No rash noted. No pallor.  Psychiatric: He has a normal mood and affect.    ED  Course  Procedures (including critical care time)  Labs Reviewed  CBC WITH DIFFERENTIAL - Abnormal; Notable for the following:    WBC 11.3 (*)     Hemoglobin 17.1 (*)     Monocytes Relative 13 (*)     Monocytes Absolute 1.5 (*)     All other components within normal limits  POCT I-STAT 3, BLOOD GAS (G3+) - Abnormal; Notable for the following:    pO2, Arterial 55.0 (*)     Bicarbonate 26.6 (*)     All other components within normal limits  POCT I-STAT 3, BLOOD GAS (G3P V) - Abnormal; Notable for the following:    pH, Ven 7.391 (*)     pCO2, Ven 50.2 (*)     pO2, Ven 28.0 (*)     Bicarbonate 30.4 (*)     Acid-Base Excess 4.0 (*)     All other components within normal limits  TROPONIN I  COMPREHENSIVE METABOLIC PANEL   Dg Chest 2 View  12/05/2011  *RADIOLOGY REPORT*  Clinical Data: Bilateral chest pain.  Right shoulder pain.  COPD, nausea.  History of smoking.  CHEST - 2 VIEW  Comparison: 11/20/2011  Findings: The lungs are hyperinflated.  Perihilar bronchitic changes and biapical scarring are again noted.  There are no focal consolidations.  Bilateral pleural thickening/scarring is noted. Old rib fractures are present.  No evidence for pulmonary edema. Surgical clips are identified in the left upper quadrant of the abdomen.  Degenerative changes are seen in the spine.  IMPRESSION:  1.  Hyperinflation/bronchitic changes. 2. No evidence for acute cardiopulmonary abnormality.  Original Report Authenticated By: Patterson Hammersmith, M.D.     1. COPD (chronic obstructive pulmonary disease) with chronic bronchitis     RA sat of 92% I interpret to be mildly low and thus abn.  ECG at time 17:22, shows NSR at rate 87, short PR interval, right atrial enlargement seen previously on 11/20/11.  Non specific ST changes that are likely due to artifact.    10:37 PM Pt reports with IV meds, nebulizer treatment, he feels much improved, pain is gone, breathing much easier.  Pt feels comfortable going  home, I instructed on using inhaler scheduled for a few days, follow up with PCP this week, return if worse.    MDM  Given decreased air movement, findings on CXR suggesting hyperinflation, I suspect that chest pain is from severe COPD exacerbation.  Will give continuous neb, atrovent, steroids, may need admission.  Doubt cardiac.          Gavin Pound. Oletta Lamas, MD 12/05/11 2255

## 2011-12-05 NOTE — ED Notes (Signed)
Chest pain started  X 3 sdays  Had collapsed lung back in may  Sob and nausea

## 2011-12-05 NOTE — Discharge Instructions (Signed)
 Bronchitis Bronchitis is the body's way of reacting to injury and/or infection (inflammation) of the bronchi. Bronchi are the air tubes that extend from the windpipe into the lungs. If the inflammation becomes severe, it may cause shortness of breath. CAUSES  Inflammation may be caused by:  A virus.   Germs (bacteria).   Dust.   Allergens.   Pollutants and many other irritants.  The cells lining the bronchial tree are covered with tiny hairs (cilia). These constantly beat upward, away from the lungs, toward the mouth. This keeps the lungs free of pollutants. When these cells become too irritated and are unable to do their job, mucus begins to develop. This causes the characteristic cough of bronchitis. The cough clears the lungs when the cilia are unable to do their job. Without either of these protective mechanisms, the mucus would settle in the lungs. Then you would develop pneumonia. Smoking is a common cause of bronchitis and can contribute to pneumonia. Stopping this habit is the single most important thing you can do to help yourself. TREATMENT   Your caregiver may prescribe an antibiotic if the cough is caused by bacteria. Also, medicines that open up your airways make it easier to breathe. Your caregiver may also recommend or prescribe an expectorant. It will loosen the mucus to be coughed up. Only take over-the-counter or prescription medicines for pain, discomfort, or fever as directed by your caregiver.   Removing whatever causes the problem (smoking, for example) is critical to preventing the problem from getting worse.   Cough suppressants may be prescribed for relief of cough symptoms.   Inhaled medicines may be prescribed to help with symptoms now and to help prevent problems from returning.   For those with recurrent (chronic) bronchitis, there may be a need for steroid medicines.  SEEK IMMEDIATE MEDICAL CARE IF:   During treatment, you develop more pus-like mucus  (purulent sputum).   You have a fever.   Your baby is older than 3 months with a rectal temperature of 102 F (38.9 C) or higher.   Your baby is 70 months old or younger with a rectal temperature of 100.4 F (38 C) or higher.   You become progressively more ill.   You have increased difficulty breathing, wheezing, or shortness of breath.  It is necessary to seek immediate medical care if you are elderly or sick from any other disease. MAKE SURE YOU:   Understand these instructions.   Will watch your condition.   Will get help right away if you are not doing well or get worse.  Document Released: 04/25/2005 Document Revised: 04/14/2011 Document Reviewed: 03/04/2008 [facility] Patient Information 2012 [place], [place].   Narcotic and benzodiazepine use may cause drowsiness, slowed breathing or dependence.  Please use with caution and do not drive, operate machinery or watch young children alone while taking them.  Taking combinations of these medications or drinking alcohol will potentiate these effects.

## 2011-12-05 NOTE — ED Notes (Signed)
respiratory informed of treatment.

## 2011-12-05 NOTE — ED Notes (Signed)
Pt discharged home in improved condition. 

## 2011-12-08 ENCOUNTER — Telehealth: Payer: Self-pay | Admitting: Pulmonary Disease

## 2011-12-08 NOTE — Telephone Encounter (Signed)
Pt never seen in the office and according to records Dr. Laneta Simmers is the one who has been filling pain meds. I advised the pt to contact them. Carron Curie, CMA

## 2011-12-18 ENCOUNTER — Encounter (HOSPITAL_COMMUNITY): Payer: Self-pay

## 2011-12-18 ENCOUNTER — Emergency Department (HOSPITAL_COMMUNITY)
Admission: EM | Admit: 2011-12-18 | Discharge: 2011-12-18 | Disposition: A | Discharge status: Home / Self Care | Payer: Medicare Other | Attending: Emergency Medicine | Admitting: Emergency Medicine

## 2011-12-18 ENCOUNTER — Emergency Department (HOSPITAL_COMMUNITY): Payer: Medicare Other

## 2011-12-18 DIAGNOSIS — R64 Cachexia: Secondary | ICD-10-CM | POA: Insufficient documentation

## 2011-12-18 DIAGNOSIS — E871 Hypo-osmolality and hyponatremia: Secondary | ICD-10-CM | POA: Insufficient documentation

## 2011-12-18 DIAGNOSIS — R197 Diarrhea, unspecified: Secondary | ICD-10-CM | POA: Insufficient documentation

## 2011-12-18 DIAGNOSIS — R0602 Shortness of breath: Secondary | ICD-10-CM | POA: Insufficient documentation

## 2011-12-18 DIAGNOSIS — D72829 Elevated white blood cell count, unspecified: Secondary | ICD-10-CM

## 2011-12-18 DIAGNOSIS — Z8571 Personal history of Hodgkin lymphoma: Secondary | ICD-10-CM | POA: Insufficient documentation

## 2011-12-18 DIAGNOSIS — F17200 Nicotine dependence, unspecified, uncomplicated: Secondary | ICD-10-CM | POA: Insufficient documentation

## 2011-12-18 DIAGNOSIS — R079 Chest pain, unspecified: Secondary | ICD-10-CM | POA: Insufficient documentation

## 2011-12-18 LAB — CBC WITH DIFFERENTIAL/PLATELET
Eosinophils Absolute: 0.2 10*3/uL (ref 0.0–0.7)
Eosinophils Relative: 1 % (ref 0–5)
Hemoglobin: 15.8 g/dL (ref 13.0–17.0)
Lymphocytes Relative: 19 % (ref 12–46)
Lymphs Abs: 3 10*3/uL (ref 0.7–4.0)
MCH: 30.5 pg (ref 26.0–34.0)
MCV: 88.4 fL (ref 78.0–100.0)
Monocytes Relative: 16 % — ABNORMAL HIGH (ref 3–12)
Neutrophils Relative %: 64 % (ref 43–77)
Platelets: 279 10*3/uL (ref 150–400)
RBC: 5.18 MIL/uL (ref 4.22–5.81)
WBC: 16.3 10*3/uL — ABNORMAL HIGH (ref 4.0–10.5)

## 2011-12-18 LAB — COMPREHENSIVE METABOLIC PANEL
ALT: 26 U/L (ref 0–53)
Alkaline Phosphatase: 93 U/L (ref 39–117)
BUN: 16 mg/dL (ref 6–23)
CO2: 26 mEq/L (ref 19–32)
GFR calc Af Amer: >90 mL/min (ref >90)
GFR calc non Af Amer: >90 mL/min (ref >90)
Glucose, Bld: 128 mg/dL — ABNORMAL HIGH (ref 70–99)
Potassium: 3.9 mEq/L (ref 3.5–5.1)
Sodium: 132 mEq/L — ABNORMAL LOW (ref 135–145)
Total Protein: 7.8 g/dL (ref 6.0–8.3)

## 2011-12-18 LAB — POCT I-STAT TROPONIN I: Troponin i, poc: 0 ng/mL (ref 0.00–0.08)

## 2011-12-18 MED ORDER — OXYCODONE-ACETAMINOPHEN 5-325 MG PO TABS: 2.0000 | ORAL_TABLET | ORAL | Status: DC | PRN

## 2011-12-18 MED ORDER — MORPHINE SULFATE 4 MG/ML IJ SOLN
4.0000 mg | Freq: Once | INTRAMUSCULAR | Status: AC
  Administered 2011-12-18: 4 mg via INTRAVENOUS
  Filled 2011-12-18: qty 1

## 2011-12-18 MED ORDER — ONDANSETRON HCL 4 MG/2ML IJ SOLN
4.0000 mg | Freq: Once | INTRAMUSCULAR | Status: AC
  Administered 2011-12-18: 4 mg via INTRAVENOUS
  Filled 2011-12-18: qty 2

## 2011-12-18 MED ORDER — SODIUM CHLORIDE 0.9 % IV BOLUS (SEPSIS)
500.0000 mL | Freq: Once | INTRAVENOUS | Status: AC
  Administered 2011-12-18: 500 mL via INTRAVENOUS

## 2011-12-18 MED ORDER — LOPERAMIDE HCL 2 MG PO CAPS: 2.0000 mg | ORAL_CAPSULE | Freq: Four times a day (QID) | ORAL | Status: DC | PRN

## 2011-12-18 NOTE — ED Notes (Signed)
Pt for discharge.GCS 15 and vital signs stable. 

## 2011-12-18 NOTE — ED Notes (Signed)
Pt reports anterior chest wall pain, upper abd pain, diarrhea x1 month, pt reports decrease appetite x2-3 days and increase in pain.

## 2011-12-18 NOTE — ED Provider Notes (Addendum)
History     CSN: [id number]  Arrival date & time 12/18/11  1522   First MD Initiated Contact with Patient 12/18/11 1737      Chief Complaint  Patient presents with  . Chest Pain    (Consider location/radiation/quality/duration/timing/severity/associated sxs/prior treatment) Patient is a 56 y.o. male presenting with chest pain. The history is provided by the patient and medical records.  Chest Pain Primary symptoms include cough and abdominal pain. Pertinent negatives for primary symptoms include no fever, no shortness of breath, no palpitations, no nausea and no vomiting.  Pertinent negatives for associated symptoms include no diaphoresis and no weakness.    56 year old, smoker, presents to emergency department complaining of pain from his chest.  On the way down to his pelvis for the past month.  It is associated with cough, and clear sputum, along with diarrhea.  The pain increases, if he drinks coffee.  He has also had an approximately 20 pound weight loss since may, of this year.  He denies nausea, or vomiting.  He denies fevers, chills, or sweating.  He denies blood in his stool.  He has never had a cancer.  He does drink alcohol.  Past Medical History  Diagnosis Date  . Thyroid disease     hyper, not on meds  . COPD (chronic obstructive pulmonary disease)   . Hodgkin's disease 1993    with abdominal surgical scar -pt unable to describe   . Spontaneous pneumothorax     left  . Spontaneous pneumothorax     right    Past Surgical History  Procedure Date  . Splenectomy   . Chest tube insertion     left  . Chest tube insertion     right    History reviewed. No pertinent family history.  History  Substance Use Topics  . Smoking status: Current Everyday Smoker -- 2.0 packs/day for 41 years    Types: Cigarettes  . Smokeless tobacco: Not on file  . Alcohol Use: 25.2 oz/week    42 Cans of beer per week      Review of Systems  Constitutional: Positive for  unexpected weight change. Negative for fever, chills and diaphoresis.  HENT: Negative for neck pain.   Eyes: Negative for visual disturbance.  Respiratory: Positive for cough. Negative for chest tightness and shortness of breath.   Cardiovascular: Positive for chest pain. Negative for palpitations and leg swelling.  Gastrointestinal: Positive for abdominal pain and diarrhea. Negative for nausea and vomiting.  Musculoskeletal: Negative for back pain.  Skin: Negative for rash.  Neurological: Negative for weakness and headaches.  Psychiatric/Behavioral: Negative for confusion.  All other systems reviewed and are negative.    Allergies  Codeine  Home Medications   Current Outpatient Rx  Name Route Sig Dispense Refill  . ALBUTEROL SULFATE HFA 108 (90 BASE) MCG/ACT IN AERS Inhalation Inhale 2 puffs into the lungs every 6 (six) hours as needed. wheezing    . BUDESONIDE-FORMOTEROL FUMARATE 160-4.5 MCG/ACT IN AERO Inhalation Inhale 2 puffs into the lungs 2 (two) times daily.      BP 113/78  Pulse 93  Temp 99 F (37.2 C) (Oral)  Resp 16  SpO2 92%  Physical Exam  Nursing note and vitals reviewed. Constitutional: He is oriented to person, place, and time. No distress.       Cachectic male, in no distress  HENT:  Head: Normocephalic and atraumatic.  Eyes: Conjunctivae are normal. Pupils are equal, round, and reactive to light.  Neck:  Normal range of motion. Neck supple.  Cardiovascular: Normal rate and intact distal pulses.   No murmur heard. Pulmonary/Chest: Effort normal and breath sounds normal. No respiratory distress. He has no wheezes. He has no rales.  Abdominal: Soft. Bowel sounds are normal. There is no tenderness. There is no guarding.  Musculoskeletal: Normal range of motion. He exhibits no edema and no tenderness.  Neurological: He is alert and oriented to person, place, and time.  Skin: Skin is warm and dry.  Psychiatric: He has a normal mood and affect. Thought content  normal.    ED Course  Procedures (including critical care time) 57 year old, male, presents with chest and abdominal pain for the past month, associated with the 20 pound weight loss over the past few months.  He has had diarrhea as well He smokes cigarettes.  He has not had nausea, vomiting, or blood in his stool.  He is cachectic, but in no distress.  We will establish an IV and perform laboratory testing, as well as a chest x-ray, since.  He smokes in the attempt to see if he has a malignancy causing his weight loss  Labs Reviewed  CBC WITH DIFFERENTIAL - Abnormal; Notable for the following:    WBC 16.3 (*)     Neutro Abs 10.4 (*)     Monocytes Relative 16 (*)     Monocytes Absolute 2.5 (*)     All other components within normal limits  COMPREHENSIVE METABOLIC PANEL - Abnormal; Notable for the following:    Sodium 132 (*)     Glucose, Bld 128 (*)     Albumin 3.3 (*)     All other components within normal limits  POCT I-STAT TROPONIN I   Dg Chest 2 View  12/18/2011  *RADIOLOGY REPORT*  Clinical Data: Chest pain.  Shortness of breath.  CHEST - 2 VIEW  Comparison: 12/05/2011  Findings: Prominent emphysema noted with right apical scarring. Scattered bulla noted.  Old bilateral rib fractures appear healed.  Atherosclerotic aortic arch noted.  Heart size is within normal limits.  Stable blunting of the right lateral costophrenic angle noted. Mild thoracic spondylosis is present.  IMPRESSION:  1.  Stable appearance of chest, with emphysema and scattered scarring, but no acute findings.  Original Report Authenticated By: Dellia Cloud, M.D.     No diagnosis found.  ECG. Normal sinus rhythm at 94 beats per minute. Normal axis. Right atrial enlargement. Normal.  ST and T-wave   MDM  Enteritis, with leukocytosis        Cheri Guppy, MD 12/18/11 2956  Cheri Guppy, MD 01/18/12 [phone]

## 2011-12-19 ENCOUNTER — Other Ambulatory Visit: Payer: Self-pay | Admitting: *Deleted

## 2011-12-24 ENCOUNTER — Emergency Department (HOSPITAL_COMMUNITY): Payer: Medicare Other

## 2011-12-24 ENCOUNTER — Inpatient Hospital Stay (HOSPITAL_COMMUNITY)
Admission: EM | Admit: 2011-12-24 | Discharge: 2011-12-26 | DRG: 189 | Disposition: A | Discharge status: Home / Self Care | Payer: Medicare Other | Attending: Internal Medicine | Admitting: Internal Medicine

## 2011-12-24 ENCOUNTER — Encounter (HOSPITAL_COMMUNITY): Payer: Self-pay | Admitting: *Deleted

## 2011-12-24 DIAGNOSIS — F17200 Nicotine dependence, unspecified, uncomplicated: Secondary | ICD-10-CM | POA: Diagnosis present

## 2011-12-24 DIAGNOSIS — IMO0002 Reserved for concepts with insufficient information to code with codable children: Secondary | ICD-10-CM

## 2011-12-24 DIAGNOSIS — Z720 Tobacco use: Secondary | ICD-10-CM

## 2011-12-24 DIAGNOSIS — J939 Pneumothorax, unspecified: Secondary | ICD-10-CM

## 2011-12-24 DIAGNOSIS — R0902 Hypoxemia: Secondary | ICD-10-CM

## 2011-12-24 DIAGNOSIS — R64 Cachexia: Secondary | ICD-10-CM | POA: Diagnosis present

## 2011-12-24 DIAGNOSIS — J449 Chronic obstructive pulmonary disease, unspecified: Secondary | ICD-10-CM

## 2011-12-24 DIAGNOSIS — R079 Chest pain, unspecified: Secondary | ICD-10-CM

## 2011-12-24 DIAGNOSIS — E871 Hypo-osmolality and hyponatremia: Secondary | ICD-10-CM

## 2011-12-24 DIAGNOSIS — F1010 Alcohol abuse, uncomplicated: Secondary | ICD-10-CM | POA: Diagnosis present

## 2011-12-24 DIAGNOSIS — J441 Chronic obstructive pulmonary disease with (acute) exacerbation: Secondary | ICD-10-CM

## 2011-12-24 DIAGNOSIS — Z9119 Patient's noncompliance with other medical treatment and regimen: Secondary | ICD-10-CM

## 2011-12-24 DIAGNOSIS — J9620 Acute and chronic respiratory failure, unspecified whether with hypoxia or hypercapnia: Principal | ICD-10-CM

## 2011-12-24 DIAGNOSIS — Z681 Body mass index (BMI) 19 or less, adult: Secondary | ICD-10-CM

## 2011-12-24 DIAGNOSIS — Z91199 Patient's noncompliance with other medical treatment and regimen due to unspecified reason: Secondary | ICD-10-CM

## 2011-12-24 DIAGNOSIS — Z79899 Other long term (current) drug therapy: Secondary | ICD-10-CM

## 2011-12-24 DIAGNOSIS — Z8571 Personal history of Hodgkin lymphoma: Secondary | ICD-10-CM

## 2011-12-24 HISTORY — DX: Pneumonia, unspecified organism: J18.9

## 2011-12-24 LAB — D-DIMER, QUANTITATIVE (NOT AT ARMC): D-Dimer, Quant: 1.09 ug/mL-FEU — ABNORMAL HIGH (ref 0.00–0.48)

## 2011-12-24 LAB — CBC
Hemoglobin: 15.5 g/dL (ref 13.0–17.0)
MCH: 30 pg (ref 26.0–34.0)
MCHC: 33.8 g/dL (ref 30.0–36.0)
RDW: 14.7 % (ref 11.5–15.5)

## 2011-12-24 LAB — CARDIAC PANEL(CRET KIN+CKTOT+MB+TROPI)
CK, MB: 1.2 ng/mL (ref 0.3–4.0)
Relative Index: INVALID (ref 0.0–2.5)
Troponin I: <0.3 ng/mL (ref <0.30)

## 2011-12-24 LAB — BASIC METABOLIC PANEL
BUN: 11 mg/dL (ref 6–23)
Calcium: 10 mg/dL (ref 8.4–10.5)
GFR calc Af Amer: >90 mL/min (ref >90)
GFR calc non Af Amer: >90 mL/min (ref >90)
Glucose, Bld: 145 mg/dL — ABNORMAL HIGH (ref 70–99)
Potassium: 3.8 mEq/L (ref 3.5–5.1)
Sodium: 139 mEq/L (ref 135–145)

## 2011-12-24 LAB — POCT I-STAT TROPONIN I

## 2011-12-24 MED ORDER — SODIUM CHLORIDE 0.9 % IV SOLN
INTRAVENOUS | Status: DC
  Administered 2011-12-24 – 2011-12-26 (×3): via INTRAVENOUS

## 2011-12-24 MED ORDER — SODIUM CHLORIDE 0.9 % IJ SOLN
3.0000 mL | Freq: Two times a day (BID) | INTRAMUSCULAR | Status: DC
  Administered 2011-12-25 – 2011-12-26 (×2): 3 mL via INTRAVENOUS

## 2011-12-24 MED ORDER — BUDESONIDE-FORMOTEROL FUMARATE 160-4.5 MCG/ACT IN AERO
2.0000 | INHALATION_SPRAY | Freq: Two times a day (BID) | RESPIRATORY_TRACT | Status: DC
  Administered 2011-12-25 – 2011-12-26 (×3): 2 via RESPIRATORY_TRACT
  Filled 2011-12-24: qty 6

## 2011-12-24 MED ORDER — METHYLPREDNISOLONE SODIUM SUCC 125 MG IJ SOLR
60.0000 mg | Freq: Four times a day (QID) | INTRAMUSCULAR | Status: DC
  Administered 2011-12-24 – 2011-12-26 (×7): 60 mg via INTRAVENOUS
  Filled 2011-12-24 (×6): qty 0.96
  Filled 2011-12-24: qty 2
  Filled 2011-12-24 (×4): qty 0.96

## 2011-12-24 MED ORDER — SENNOSIDES-DOCUSATE SODIUM 8.6-50 MG PO TABS
1.0000 | ORAL_TABLET | Freq: Every evening | ORAL | Status: DC | PRN
  Filled 2011-12-24: qty 1

## 2011-12-24 MED ORDER — IOHEXOL 350 MG/ML SOLN
100.0000 mL | Freq: Once | INTRAVENOUS | Status: AC | PRN
  Administered 2011-12-24: 100 mL via INTRAVENOUS

## 2011-12-24 MED ORDER — ACETAMINOPHEN 325 MG PO TABS: 650.0000 mg | ORAL_TABLET | Freq: Four times a day (QID) | ORAL | Status: DC | PRN

## 2011-12-24 MED ORDER — FOLIC ACID 1 MG PO TABS
1.0000 mg | ORAL_TABLET | Freq: Every day | ORAL | Status: DC
  Administered 2011-12-24 – 2011-12-26 (×3): 1 mg via ORAL
  Filled 2011-12-24 (×3): qty 1

## 2011-12-24 MED ORDER — ALBUTEROL (5 MG/ML) CONTINUOUS INHALATION SOLN
10.0000 mg/h | INHALATION_SOLUTION | RESPIRATORY_TRACT | Status: DC
  Administered 2011-12-24: 10 mg/h via RESPIRATORY_TRACT
  Filled 2011-12-24: qty 20

## 2011-12-24 MED ORDER — ONDANSETRON HCL 4 MG/2ML IJ SOLN
4.0000 mg | Freq: Once | INTRAMUSCULAR | Status: AC
  Administered 2011-12-24: 4 mg via INTRAVENOUS
  Filled 2011-12-24: qty 2

## 2011-12-24 MED ORDER — ASPIRIN EC 81 MG PO TBEC
81.0000 mg | DELAYED_RELEASE_TABLET | Freq: Every day | ORAL | Status: DC
Start: 2011-12-24 — End: 2011-12-26
  Administered 2011-12-24 – 2011-12-26 (×3): 81 mg via ORAL
  Filled 2011-12-24 (×3): qty 1

## 2011-12-24 MED ORDER — ONDANSETRON HCL 4 MG PO TABS
4.0000 mg | ORAL_TABLET | Freq: Four times a day (QID) | ORAL | Status: DC | PRN
  Administered 2011-12-26: 4 mg via ORAL
  Filled 2011-12-24: qty 1

## 2011-12-24 MED ORDER — VITAMIN B-1 100 MG PO TABS
100.0000 mg | ORAL_TABLET | Freq: Every day | ORAL | Status: DC
  Administered 2011-12-24 – 2011-12-26 (×3): 100 mg via ORAL
  Filled 2011-12-24 (×3): qty 1

## 2011-12-24 MED ORDER — ALBUTEROL SULFATE (5 MG/ML) 0.5% IN NEBU
2.5000 mg | INHALATION_SOLUTION | Freq: Four times a day (QID) | RESPIRATORY_TRACT | Status: DC
  Administered 2011-12-24 – 2011-12-25 (×3): 2.5 mg via RESPIRATORY_TRACT
  Filled 2011-12-24 (×3): qty 0.5

## 2011-12-24 MED ORDER — OXYCODONE HCL 5 MG PO TABS
5.0000 mg | ORAL_TABLET | ORAL | Status: DC | PRN
  Administered 2011-12-24 – 2011-12-26 (×9): 5 mg via ORAL
  Filled 2011-12-24 (×10): qty 1

## 2011-12-24 MED ORDER — ACETAMINOPHEN 650 MG RE SUPP: 650.0000 mg | Freq: Four times a day (QID) | RECTAL | Status: DC | PRN

## 2011-12-24 MED ORDER — LEVOFLOXACIN 500 MG PO TABS
500.0000 mg | ORAL_TABLET | ORAL | Status: DC
  Administered 2011-12-24 – 2011-12-25 (×2): 500 mg via ORAL
  Filled 2011-12-24 (×3): qty 1

## 2011-12-24 MED ORDER — IPRATROPIUM BROMIDE 0.02 % IN SOLN
0.5000 mg | Freq: Four times a day (QID) | RESPIRATORY_TRACT | Status: DC
  Administered 2011-12-24 – 2011-12-25 (×3): 0.5 mg via RESPIRATORY_TRACT
  Filled 2011-12-24 (×3): qty 2.5

## 2011-12-24 MED ORDER — MORPHINE SULFATE 2 MG/ML IJ SOLN: 1.0000 mg | INTRAMUSCULAR | Status: DC | PRN

## 2011-12-24 MED ORDER — OXYCODONE HCL 5 MG PO TABS: 5.0000 mg | ORAL_TABLET | ORAL | Status: DC | PRN

## 2011-12-24 MED ORDER — MORPHINE SULFATE 4 MG/ML IJ SOLN
4.0000 mg | Freq: Once | INTRAMUSCULAR | Status: AC
  Administered 2011-12-24: 4 mg via INTRAVENOUS
  Filled 2011-12-24: qty 1

## 2011-12-24 MED ORDER — KETOROLAC TROMETHAMINE 30 MG/ML IJ SOLN
30.0000 mg | Freq: Once | INTRAMUSCULAR | Status: AC
  Administered 2011-12-24: 30 mg via INTRAVENOUS
  Filled 2011-12-24: qty 1

## 2011-12-24 MED ORDER — ENOXAPARIN SODIUM 40 MG/0.4ML ~~LOC~~ SOLN
40.0000 mg | SUBCUTANEOUS | Status: DC
  Administered 2011-12-24 – 2011-12-25 (×2): 40 mg via SUBCUTANEOUS
  Filled 2011-12-24 (×3): qty 0.4

## 2011-12-24 MED ORDER — ONDANSETRON HCL 4 MG/2ML IJ SOLN: 4.0000 mg | Freq: Four times a day (QID) | INTRAMUSCULAR | Status: DC | PRN

## 2011-12-24 MED ORDER — ALBUTEROL SULFATE (5 MG/ML) 0.5% IN NEBU
2.5000 mg | INHALATION_SOLUTION | RESPIRATORY_TRACT | Status: DC | PRN
  Filled 2011-12-24: qty 0.5

## 2011-12-24 NOTE — ED Provider Notes (Signed)
History     CSN: [id number]  Arrival date & time 12/24/11  1158   First MD Initiated Contact with Patient 12/24/11 1254      Chief Complaint  Patient presents with  . Chest Pain  . Shortness of Breath    (Consider location/radiation/quality/duration/timing/severity/associated sxs/prior treatment) HPI Patient states that he's had shortness of breath with chest pain over the last month.  Patient states that he was here 6 days ago for evaluation of same symptoms and was discharged home.  Patient states that his pain is in the upper part of his chest bilaterally.  Patient states that he seems to be getting worse.  Patient has a history of chronic lung disease, with spontaneous pneumothorax bilaterally.  Patient has a history of Hodgkin's lymphoma.  Patient denies nausea, vomiting, diarrhea, abdominal pain, fever, headache, visual changes, neck pain, back pain, or bloody stools.  Patient states, that he's feeling weak and fatigued and has not been able to get out of bed the last 2 days.  Past Medical History  Diagnosis Date  . Thyroid disease     hyper, not on meds  . COPD (chronic obstructive pulmonary disease)   . Hodgkin's disease 1993    with abdominal surgical scar -pt unable to describe   . Spontaneous pneumothorax     left  . Spontaneous pneumothorax     right    Past Surgical History  Procedure Date  . Splenectomy   . Chest tube insertion     left  . Chest tube insertion     right    History reviewed. No pertinent family history.  History  Substance Use Topics  . Smoking status: Current Everyday Smoker -- 2.0 packs/day for 41 years    Types: Cigarettes  . Smokeless tobacco: Not on file  . Alcohol Use: 25.2 oz/week    42 Cans of beer per week      Review of Systems All other systems negative except as documented in the HPI. All pertinent positives and negatives as reviewed in the HPI.  Allergies  Codeine  Home Medications   Current Outpatient Rx  Name  Route Sig Dispense Refill  . ALBUTEROL SULFATE HFA 108 (90 BASE) MCG/ACT IN AERS Inhalation Inhale 2 puffs into the lungs every 6 (six) hours as needed. wheezing    . BUDESONIDE-FORMOTEROL FUMARATE 160-4.5 MCG/ACT IN AERO Inhalation Inhale 2 puffs into the lungs 2 (two) times daily.    [name] LOPERAMIDE HCL 2 MG PO CAPS Oral Take 2 mg by mouth 4 (four) times daily as needed. For diarrhea    . OXYCODONE-ACETAMINOPHEN 5-325 MG PO TABS Oral Take 2 tablets by mouth every 4 (four) hours as needed for pain. 6 tablet 0    BP 102/64  Pulse 86  Temp 98.1 F (36.7 C) (Oral)  Resp 18  SpO2 99%  Physical Exam  Constitutional: He is oriented to person, place, and time. He appears well-developed and well-nourished. He appears distressed.  HENT:  Head: Normocephalic and atraumatic.  Mouth/Throat: Oropharynx is clear and moist.  Eyes: Pupils are equal, round, and reactive to light.  Neck: Normal range of motion. Neck supple.  Cardiovascular: Normal rate and regular rhythm.   Pulmonary/Chest: Tachypnea noted. No respiratory distress. He has decreased breath sounds. He has no wheezes. He has no rhonchi. He has no rales.  Abdominal: Soft. Bowel sounds are normal. There is no tenderness.  Neurological: He is alert and oriented to person, place, and time.  Skin: Skin  is warm and dry. No rash noted.    ED Course  Procedures (including critical care time)  Labs Reviewed  CBC - Abnormal; Notable for the following:    WBC 11.0 (*)     All other components within normal limits  BASIC METABOLIC PANEL - Abnormal; Notable for the following:    Glucose, Bld 145 (*)     All other components within normal limits  D-DIMER, QUANTITATIVE - Abnormal; Notable for the following:    D-Dimer, Quant 1.09 (*)     All other components within normal limits  POCT I-STAT TROPONIN I   Dg Chest 2 View  12/24/2011  *RADIOLOGY REPORT*  Clinical Data: Chest pain and shortness of breath.  History of collapsed lungs, smoking.   CHEST - 2 VIEW  Comparison: 12/18/2011  Findings: Lungs are hyperinflated.  There are coarse parenchymal markings and emphysematous changes bilaterally.  No focal consolidations.  Possible small right pleural effusions versus pleural thickening.  Old rib fractures are noted.  Surgical clips are identified in the left upper quadrant.  IMPRESSION:  1.  Emphysematous changes. 2. No evidence for acute pulmonary abnormality.  Original Report Authenticated By: Patterson Hammersmith, M.D.   Patient to be moved to the CDU awaiting further testing. He may need admission but will await the tests and response to treatment.  MDM  MDM Reviewed: nursing note and vitals Reviewed previous: labs and ECG Interpretation: labs, ECG and x-ray   Date: 12/24/2011  Rate: 104  Rhythm: sinus tachycardia  QRS Axis: right  Intervals: normal  ST/T Wave abnormalities: nonspecific ST/T changes  Conduction Disutrbances:none  Narrative Interpretation:   Old EKG Reviewed: unchanged            Carlyle Dolly, PA-C 12/24/11 1523

## 2011-12-24 NOTE — ED Notes (Signed)
Pt reports sob and left side chest pain for several days. Having productive cough with white sputum, hx of copd. ekg being done at triage.

## 2011-12-24 NOTE — ED Notes (Signed)
Report given to Donnamae Jude, Charity fundraiser. Pt moved to CDU 2.

## 2011-12-24 NOTE — ED Notes (Signed)
Patient transported to CT 

## 2011-12-24 NOTE — ED Provider Notes (Signed)
Pt placed in the CDU to await CT angio of the chest results to r/o PE or pulmonary abnormality.   Patients CT is normal. However, he continues to be fatigued during ambulation with low sats. Unsure of where chest pain is coming from. Pt has negative troponins.  Admitting patient for Chest Pain R/o and COPD exacerbation as this is his 3rd visit in two weeks with no additional work up.  Dr. Ardyth Harps, team 4, telemetry, inpatient  Dorthula Matas, [place] 12/24/11 1610

## 2011-12-24 NOTE — H&P (Signed)
PCP:   Franchot Mimes, MD   Chief Complaint:  Chest pain, shortness of breath.  HPI: Mr. Philip Coleman is a 56 year old white man with a history of severe COPD, alcohol and tobacco abuse who comes today for his third admission in the last 3 weeks with complaints of chest pain and shortness of breath. The prior 2 admissions they had ruled him out for acute coronary syndrome and he had received a few breathing treatments, he had some improvement and was sent home. Today patient tells me that he has been having progressive shortness of breath and increased chest pain over the last 6 weeks. There was no acute event today that brought him to the hospital. Patient tells me he lives in a boarding house and is used to going upstairs to where there is a bar and drinking a few beers each day, however over the past 3 weeks he has not been able to do that secondary to his presenting complaints. He is supposed to be wearing 1 L of oxygen but is very noncompliant with this. I note that he was in the hospital in May of this year at which time he was diagnosed with a pneumothorax and had chest tube placement followed by a vats, he has not had any medical followup since that admission. His COPD regimen is suboptimal with only albuterol and Symbicort; and I am not even sure he takes it is consistently given the fact that he has not had regular medical followups and I am unsure who is prescribing these medications. In the ED he was going to be sent home, however when they ambulated him he became hypoxic to 87-88% range on room air so we are asked to admit him for further evaluation and management. A CT angiogram of the chest was done that did not show any evidence for pulmonary embolism.  Allergies:   Allergies  Allergen Reactions  . Codeine Hives and Nausea And Vomiting      Past Medical History  Diagnosis Date  . Thyroid disease     hyper, not on meds  . COPD (chronic obstructive pulmonary disease)   . Hodgkin's  disease 1993    with abdominal surgical scar -pt unable to describe   . Spontaneous pneumothorax     left  . Spontaneous pneumothorax     right    Past Surgical History  Procedure Date  . Splenectomy   . Chest tube insertion     left  . Chest tube insertion     right    Prior to Admission medications   Medication Sig Start Date End Date Taking? Authorizing Provider  albuterol (PROVENTIL HFA;VENTOLIN HFA) 108 (90 BASE) MCG/ACT inhaler Inhale 2 puffs into the lungs every 6 (six) hours as needed. wheezing   Yes Historical Provider, MD  budesonide-formoterol (SYMBICORT) 160-4.5 MCG/ACT inhaler Inhale 2 puffs into the lungs 2 (two) times daily.   Yes Historical Provider, MD  loperamide (IMODIUM) 2 MG capsule Take 2 mg by mouth 4 (four) times daily as needed. For diarrhea   Yes Historical Provider, MD  oxyCODONE-acetaminophen (PERCOCET/ROXICET) 5-325 MG per tablet Take 2 tablets by mouth every 4 (four) hours as needed for pain. 12/18/11 12/28/11 Yes Cheri Guppy, MD    Social History:  reports that he has been smoking Cigarettes.  He has a 82 pack-year smoking history. He does not have any smokeless tobacco history on file. He reports that he drinks about 25.2 ounces of alcohol per week. He reports that he  does not use illicit drugs.  History reviewed. No pertinent family history.  Review of Systems:  Constitutional: Denies fever, chills, diaphoresis, appetite change and fatigue.  HEENT: Denies photophobia, eye pain, redness, hearing loss, ear pain, congestion, sore throat, rhinorrhea, sneezing, mouth sores, trouble swallowing, neck pain, neck stiffness and tinnitus.   Respiratory: Denies  Cough. Cardiovascular: Denies  palpitations and leg swelling.  Gastrointestinal: Denies nausea, vomiting, abdominal pain, diarrhea, constipation, blood in stool and abdominal distention.  Genitourinary: Denies dysuria, urgency, frequency, hematuria, flank pain and difficulty urinating.    Musculoskeletal: Denies myalgias, back pain, joint swelling, arthralgias and gait problem.  Skin: Denies pallor, rash and wound.  Neurological: Denies dizziness, seizures, syncope, weakness, light-headedness, numbness and headaches.  Hematological: Denies adenopathy. Easy bruising, personal or family bleeding history  Psychiatric/Behavioral: Denies suicidal ideation, mood changes, confusion, nervousness, sleep disturbance and agitation   Physical Exam: Blood pressure 99/64, pulse 86, temperature 98.2 F (36.8 C), temperature source Oral, resp. rate 20, SpO2 96.00%. General: Alert, awake, oriented x3, extremely cachectic. HEENT: Normocephalic, atraumatic, pupils equally reactive to light, intact extraocular muscles. Neck: Supple, no JVD, no lymphadenopathy, no bruits, no goiter. Cardiovascular: Regular rate and rhythm, no murmurs, rubs or gallops. Respiratory: No rhonchi or wheezes he does have poor air movement and cannot complete full sentences without taking a deep breath. Abdomen: Soft, nontender, nondistended, positive bowel sounds, no masses or organomegaly noted. Extremities: No clubbing, cyanosis or edema, positive pedal pulses. Neurological: Grossly intact and nonfocal although I have not ambulated him. Skin: No rashes noted.  Labs on Admission:  Results for orders placed during the hospital encounter of 12/24/11 (from the past 48 hour(s))  CBC     Status: Abnormal   Collection Time   12/24/11 12:08 PM      Component Value Range Comment   WBC 11.0 (*) 4.0 - 10.5 K/uL    RBC 5.17  4.22 - 5.81 MIL/uL    Hemoglobin 15.5  13.0 - 17.0 g/dL    HCT 11.9  14.7 - 82.9 %    MCV 88.8  78.0 - 100.0 fL    MCH 30.0  26.0 - 34.0 pg    MCHC 33.8  30.0 - 36.0 g/dL    RDW 56.2  13.0 - 86.5 %    Platelets 379  150 - 400 K/uL   BASIC METABOLIC PANEL     Status: Abnormal   Collection Time   12/24/11 12:08 PM      Component Value Range Comment   Sodium 139  135 - 145 mEq/L    Potassium 3.8   3.5 - 5.1 mEq/L    Chloride 99  96 - 112 mEq/L    CO2 30  19 - 32 mEq/L    Glucose, Bld 145 (*) 70 - 99 mg/dL    BUN 11  6 - 23 mg/dL    Creatinine, Ser 7.84  0.50 - 1.35 mg/dL    Calcium 69.6  8.4 - 10.5 mg/dL    GFR calc non Af Amer >90  >90 mL/min    GFR calc Af Amer >90  >90 mL/min   POCT I-STAT TROPONIN I     Status: Normal   Collection Time   12/24/11 12:43 PM      Component Value Range Comment   Troponin i, poc 0.00  0.00 - 0.08 ng/mL    Comment 3            D-DIMER, QUANTITATIVE     Status: Abnormal  Collection Time   12/24/11  1:49 PM      Component Value Range Comment   D-Dimer, Quant 1.09 (*) 0.00 - 0.48 ug/mL-FEU     Radiological Exams on Admission: Dg Chest 2 View  12/24/2011  *RADIOLOGY REPORT*  Clinical Data: Chest pain and shortness of breath.  History of collapsed lungs, smoking.  CHEST - 2 VIEW  Comparison: 12/18/2011  Findings: Lungs are hyperinflated.  There are coarse parenchymal markings and emphysematous changes bilaterally.  No focal consolidations.  Possible small right pleural effusions versus pleural thickening.  Old rib fractures are noted.  Surgical clips are identified in the left upper quadrant.  IMPRESSION:  1.  Emphysematous changes. 2. No evidence for acute pulmonary abnormality.  Original Report Authenticated By: Patterson Hammersmith, M.D.   Ct Angio Chest Pe W/cm &/or Wo Cm  12/24/2011  *RADIOLOGY REPORT*  Clinical Data: Shortness of breath and chest pain.  Symptoms are getting worse.  Pain radiates down left-sided chest.  History of COPD, Hodgkin's disease, splenectomy.  CT ANGIOGRAPHY CHEST  Technique:  Multidetector CT imaging of the chest using the standard protocol during bolus administration of intravenous contrast. Multiplanar reconstructed images including MIPs were obtained and reviewed to evaluate the vascular anatomy.  Contrast: OMNIPAQUE IOHEXOL 350 MG/ML SOLN  Comparison: CT of the chest with contrast 09/17/2011  Findings: The pulmonary  arteries are well opacified.  There is no evidence for acute pulmonary embolus.  The heart size is normal.  There is atherosclerotic calcification of the thoracic aorta and coronary arteries. No mediastinal, hilar, or axillary adenopathy.  The visualized portion of the thyroid gland has a normal appearance.  There are diffuse emphysematous changes.  Scarring and density in the right upper lobe appears stable over multiple prior studies.  Images of the upper abdomen show postoperative changes in the left upper quadrant posterior to the stomach.  There is elevation of the right hemidiaphragm. Degenerative changes are seen in the thoracic spine.  IMPRESSION:  1.  Diffuse emphysematous changes and right upper lobe density, stable in appearance. 2.  Technically adequate exam showing no evidence for acute pulmonary embolus.  Original Report Authenticated By: Patterson Hammersmith, M.D.    Assessment/Plan Principal Problem:  *Respiratory failure, acute-on-chronic Active Problems:  ETOH abuse  COPD with acute exacerbation  Chest pain  Hypoxemia  Tobacco abuse   #1 chest pain: I truly think this is pulmonary in nature and not cardiac. We'll however admit to telemetry and do a cardiac rule out with enzymes, EKGs and a 2-D echocardiogram as I suspect he may be developing right heart failure from his severe COPD.  #2 acute on chronic respiratory failure secondary to COPD with acute exacerbation: Even though he has no wheezing on chest auscultation he has very poor air movement. Will admit him and given frequent breathing treatments, will empirically place him on an antibiotic, also place him on steroids which will eventually start tapering. I also believe that he is hypoxemic because he is now requiring full-time oxygen and he is only using it sporadically.  #3 tobacco abuse: Patient has been counseled extensively. He shows no interest in quitting although he states he has decreased from 3 packs a day to only 3  cigarettes a day and that is as much as he is willing to do at this time.  #4 alcohol abuse: Thiamine and folate. Have provided counseling. Monitor for withdrawals. I doubt he will have DTs as he has not had a drink in  about 3 weeks.  #5 DVT prophylaxis: Lovenox.   Time Spent on Admission: 55 minutes.  Chaya Jan [facility] Pager: [phone] 12/24/2011, 7:05 PM

## 2011-12-24 NOTE — ED Notes (Signed)
Pt presents to department for evaluation of L sided chest pain and diffuse abdominal pain. Ongoing x3 weeks. Pt states he recently ran out of oxycodone. 8/10 pain at the time. Abdomen soft and non tender to palpation. Bowel sounds present all quadrants. Pt states chest pain and abdominal pain increases with movement. Lung sounds clear and equal bilaterally. Pt conscious alert and oriented x4. Skin warm and dry. Respirations unlabored.

## 2011-12-24 NOTE — ED Notes (Addendum)
Patient ambulated in hallway on [place] 2L.  Initial O2 sat of 96%; dropped to 90% while ambulating; patient began coughing, SaO2 dropped to 86%; patient assisted back to the bed and educated on the need to deep breathe through his nose until SaO2 returned to 92%.  RN Azucena Kuba and PA Neva Seat informed of patient's ability to ambulate on oxygen.  Patient c/o pain "in lungs" and SOB while ambulating.  Patient needed to rest after approximately 40 feet of ambulating due to feeling SOB.  Patient's RR noted to be at 28 at that time.

## 2011-12-24 NOTE — ED Notes (Addendum)
RN Azucena Kuba placed patient on 4L [place] at this time, as ordered by [employer].  O2Sat 94% on O2.  Patient states that he has O2 at home, that he wears 1L [place] at night only.  PA Neva Seat informed.

## 2011-12-24 NOTE — ED Notes (Signed)
Patient consumed 90% of meal tray.  Informed patient of the need to ambulate; O2Sat rates noted to be 86% RA.  PA Neva Seat and RN Azucena Kuba informed.

## 2011-12-25 DIAGNOSIS — R079 Chest pain, unspecified: Secondary | ICD-10-CM

## 2011-12-25 DIAGNOSIS — I369 Nonrheumatic tricuspid valve disorder, unspecified: Secondary | ICD-10-CM

## 2011-12-25 DIAGNOSIS — J441 Chronic obstructive pulmonary disease with (acute) exacerbation: Secondary | ICD-10-CM

## 2011-12-25 DIAGNOSIS — J449 Chronic obstructive pulmonary disease, unspecified: Secondary | ICD-10-CM

## 2011-12-25 LAB — CARDIAC PANEL(CRET KIN+CKTOT+MB+TROPI)
CK, MB: 1.5 ng/mL (ref 0.3–4.0)
Troponin I: <0.3 ng/mL (ref <0.30)
Troponin I: <0.3 ng/mL (ref <0.30)

## 2011-12-25 LAB — BASIC METABOLIC PANEL
Calcium: 9.2 mg/dL (ref 8.4–10.5)
GFR calc non Af Amer: >90 mL/min (ref >90)
Glucose, Bld: 135 mg/dL — ABNORMAL HIGH (ref 70–99)
Potassium: 4 mEq/L (ref 3.5–5.1)
Sodium: 138 mEq/L (ref 135–145)

## 2011-12-25 LAB — CBC
Hemoglobin: 13.1 g/dL (ref 13.0–17.0)
MCH: 29.5 pg (ref 26.0–34.0)
Platelets: 369 10*3/uL (ref 150–400)
RBC: 4.44 MIL/uL (ref 4.22–5.81)
WBC: 9.1 10*3/uL (ref 4.0–10.5)

## 2011-12-25 LAB — TSH: TSH: 1.917 u[IU]/mL (ref 0.350–4.500)

## 2011-12-25 MED ORDER — ALBUTEROL SULFATE (5 MG/ML) 0.5% IN NEBU
2.5000 mg | INHALATION_SOLUTION | Freq: Three times a day (TID) | RESPIRATORY_TRACT | Status: DC
  Administered 2011-12-25 – 2011-12-26 (×3): 2.5 mg via RESPIRATORY_TRACT
  Filled 2011-12-25 (×3): qty 0.5

## 2011-12-25 MED ORDER — IPRATROPIUM BROMIDE 0.02 % IN SOLN
0.5000 mg | Freq: Three times a day (TID) | RESPIRATORY_TRACT | Status: DC
  Administered 2011-12-25 – 2011-12-26 (×3): 0.5 mg via RESPIRATORY_TRACT
  Filled 2011-12-25 (×3): qty 2.5

## 2011-12-25 NOTE — Consult Note (Addendum)
Reason for Consult:Chest pain Referring Physician: [facility] Cardiologist: None  Philip Coleman is an 56 y.o. male.  HPI: This 56 year old gentleman was admitted to the hospital yesterday with left-sided chest pain.  He has a past history of frequent chest pain and has had 3 admissions in the last 3 weeks with complaints of chest pain shortness of breath.  He has not had any objective evidence of ischemia.  The patient came in this time because of left-sided discomfort which was a tearing sensation.  The episodes are not necessarily related to exertion and are just as likely to occur at rest.  Patient is disabled from COPD.  He continues to smoke against advice.  He has significant dyspnea on climbing one flight of stairs.  He is felt to be noncompliant with his COPD regimen.  Past Medical History  Diagnosis Date  . Thyroid disease     hyper, not on meds  . COPD (chronic obstructive pulmonary disease)   . Hodgkin's disease 1993    with abdominal surgical scar -pt unable to describe   . Spontaneous pneumothorax     left  . Spontaneous pneumothorax     right  . Pneumonia     Past Surgical History  Procedure Date  . Splenectomy   . Chest tube insertion     left  . Chest tube insertion     right    History reviewed. No pertinent family history.  Social History:  reports that he has been smoking Cigarettes.  He has a 10.25 pack-year smoking history. He does not have any smokeless tobacco history on file. He reports that he drinks about 3.6 ounces of alcohol per week. He reports that he does not use illicit drugs. he receives disability and COPD.  He is a retired Gaffer.  Allergies:  Allergies  Allergen Reactions  . Codeine Hives and Nausea And Vomiting    Medications:  Scheduled:   . albuterol  2.5 mg Nebulization TID  . aspirin EC  81 mg Oral Daily  . budesonide-formoterol  2 puff Inhalation BID  . enoxaparin (LOVENOX) injection  40 mg Subcutaneous Q24H  .  folic acid  1 mg Oral Daily  . ipratropium  0.5 mg Nebulization TID  . ketorolac  30 mg Intravenous Once  . levofloxacin  500 mg Oral Q24H  . methylPREDNISolone (SOLU-MEDROL) injection  60 mg Intravenous Q6H  .  morphine injection  4 mg Intravenous Once  . ondansetron  4 mg Intravenous Once  . sodium chloride  3 mL Intravenous Q12H  . thiamine  100 mg Oral Daily  . DISCONTD: albuterol  2.5 mg Nebulization Q6H WA  . DISCONTD: ipratropium  0.5 mg Nebulization Q6H    Results for orders placed during the hospital encounter of 12/24/11 (from the past 48 hour(s))  CBC     Status: Abnormal   Collection Time   12/24/11 12:08 PM      Component Value Range Comment   WBC 11.0 (*) 4.0 - 10.5 K/uL    RBC 5.17  4.22 - 5.81 MIL/uL    Hemoglobin 15.5  13.0 - 17.0 g/dL    HCT 40.9  81.1 - 91.4 %    MCV 88.8  78.0 - 100.0 fL    MCH 30.0  26.0 - 34.0 pg    MCHC 33.8  30.0 - 36.0 g/dL    RDW 78.2  95.6 - 21.3 %    Platelets 379  150 - 400 K/uL  BASIC METABOLIC PANEL     Status: Abnormal   Collection Time   12/24/11 12:08 PM      Component Value Range Comment   Sodium 139  135 - 145 mEq/L    Potassium 3.8  3.5 - 5.1 mEq/L    Chloride 99  96 - 112 mEq/L    CO2 30  19 - 32 mEq/L    Glucose, Bld 145 (*) 70 - 99 mg/dL    BUN 11  6 - 23 mg/dL    Creatinine, Ser 1.61  0.50 - 1.35 mg/dL    Calcium 09.6  8.4 - 10.5 mg/dL    GFR calc non Af Amer >90  >90 mL/min    GFR calc Af Amer >90  >90 mL/min   POCT I-STAT TROPONIN I     Status: Normal   Collection Time   12/24/11 12:43 PM      Component Value Range Comment   Troponin i, poc 0.00  0.00 - 0.08 ng/mL    Comment 3            D-DIMER, QUANTITATIVE     Status: Abnormal   Collection Time   12/24/11  1:49 PM      Component Value Range Comment   D-Dimer, Quant 1.09 (*) 0.00 - 0.48 ug/mL-FEU   CARDIAC PANEL(CRET KIN+CKTOT+MB+TROPI)     Status: Normal   Collection Time   12/24/11  8:00 PM      Component Value Range Comment   Total CK 33  7 - 232 U/L     CK, MB 1.2  0.3 - 4.0 ng/mL    Troponin I <0.30  <0.30 ng/mL    Relative Index RELATIVE INDEX IS INVALID  0.0 - 2.5   TSH     Status: Normal   Collection Time   12/25/11  1:45 AM      Component Value Range Comment   TSH 1.917  0.350 - 4.500 uIU/mL   BASIC METABOLIC PANEL     Status: Abnormal   Collection Time   12/25/11  1:45 AM      Component Value Range Comment   Sodium 138  135 - 145 mEq/L    Potassium 4.0  3.5 - 5.1 mEq/L    Chloride 101  96 - 112 mEq/L    CO2 28  19 - 32 mEq/L    Glucose, Bld 135 (*) 70 - 99 mg/dL    BUN 15  6 - 23 mg/dL    Creatinine, Ser 0.45  0.50 - 1.35 mg/dL    Calcium 9.2  8.4 - 40.9 mg/dL    GFR calc non Af Amer >90  >90 mL/min    GFR calc Af Amer >90  >90 mL/min   CBC     Status: Normal   Collection Time   12/25/11  1:45 AM      Component Value Range Comment   WBC 9.1  4.0 - 10.5 K/uL    RBC 4.44  4.22 - 5.81 MIL/uL    Hemoglobin 13.1  13.0 - 17.0 g/dL    HCT 81.1  91.4 - 78.2 %    MCV 88.7  78.0 - 100.0 fL    MCH 29.5  26.0 - 34.0 pg    MCHC 33.2  30.0 - 36.0 g/dL    RDW 95.6  21.3 - 08.6 %    Platelets 369  150 - 400 K/uL   CARDIAC PANEL(CRET KIN+CKTOT+MB+TROPI)     Status: Normal  Collection Time   12/25/11  1:51 AM      Component Value Range Comment   Total CK 32  7 - 232 U/L    CK, MB 1.5  0.3 - 4.0 ng/mL    Troponin I <0.30  <0.30 ng/mL    Relative Index RELATIVE INDEX IS INVALID  0.0 - 2.5   CARDIAC PANEL(CRET KIN+CKTOT+MB+TROPI)     Status: Normal   Collection Time   12/25/11  8:10 AM      Component Value Range Comment   Total CK 38  7 - 232 U/L    CK, MB 1.8  0.3 - 4.0 ng/mL    Troponin I <0.30  <0.30 ng/mL    Relative Index RELATIVE INDEX IS INVALID  0.0 - 2.5     Dg Chest 2 View  12/24/2011  *RADIOLOGY REPORT*  Clinical Data: Chest pain and shortness of breath.  History of collapsed lungs, smoking.  CHEST - 2 VIEW  Comparison: 12/18/2011  Findings: Lungs are hyperinflated.  There are coarse parenchymal markings and  emphysematous changes bilaterally.  No focal consolidations.  Possible small right pleural effusions versus pleural thickening.  Old rib fractures are noted.  Surgical clips are identified in the left upper quadrant.  IMPRESSION:  1.  Emphysematous changes. 2. No evidence for acute pulmonary abnormality.  Original Report Authenticated By: Patterson Hammersmith, M.D.   Ct Angio Chest Pe W/cm &/or Wo Cm  12/24/2011  *RADIOLOGY REPORT*  Clinical Data: Shortness of breath and chest pain.  Symptoms are getting worse.  Pain radiates down left-sided chest.  History of COPD, Hodgkin's disease, splenectomy.  CT ANGIOGRAPHY CHEST  Technique:  Multidetector CT imaging of the chest using the standard protocol during bolus administration of intravenous contrast. Multiplanar reconstructed images including MIPs were obtained and reviewed to evaluate the vascular anatomy.  Contrast: OMNIPAQUE IOHEXOL 350 MG/ML SOLN  Comparison: CT of the chest with contrast 09/17/2011  Findings: The pulmonary arteries are well opacified.  There is no evidence for acute pulmonary embolus.  The heart size is normal.  There is atherosclerotic calcification of the thoracic aorta and coronary arteries. No mediastinal, hilar, or axillary adenopathy.  The visualized portion of the thyroid gland has a normal appearance.  There are diffuse emphysematous changes.  Scarring and density in the right upper lobe appears stable over multiple prior studies.  Images of the upper abdomen show postoperative changes in the left upper quadrant posterior to the stomach.  There is elevation of the right hemidiaphragm. Degenerative changes are seen in the thoracic spine.  IMPRESSION:  1.  Diffuse emphysematous changes and right upper lobe density, stable in appearance. 2.  Technically adequate exam showing no evidence for acute pulmonary embolus.  Original Report Authenticated By: Patterson Hammersmith, M.D.    Review of systems reveals that he has a chronic  cough.Also has urinary frequency especially at night.  Blood pressure 93/64, pulse 74, temperature 98.2 F (36.8 C), temperature source Oral, resp. rate 18, height 5\' 7"  (1.702 m), weight 108 lb 0.4 oz (49 kg), SpO2 99.00%. The patient appears to be in no distress.  The patient is very thin and cachectic.  Head and neck exam reveals that the pupils are equal and reactive.  The extraocular movements are full.  There is no scleral icterus.  Mouth and pharynx are benign.  No lymphadenopathy.  No carotid bruits.  The jugular venous pressure is normal.  Thyroid is not enlarged or tender.  Chest is  clear to percussion and auscultation.  No rales or rhonchi.  Expansion of the chest is symmetrical.  Heart reveals no abnormal lift or heave.  First and second heart sounds are normal.  There is no murmur gallop rub or click.  The abdomen is soft and nontender.  Bowel sounds are normoactive.  There is no hepatosplenomegaly or mass.  There are no abdominal bruits.  Extremities reveal no phlebitis or edema.  Pedal pulses are good.  There is no cyanosis or clubbing.  Neurologic exam is normal strength and no lateralizing weakness.  No sensory deficits.  Integument reveals no rash Review of EKGs from yesterday and today shown normal sinus rhythm and a vertical axis.  There is minimal change of benign early repolarization.  There are no ischemic changes on EKG.  His CT angiogram of the chest does not show any evidence of pulmonary emboli.  His cardiac enzymes negative times 3  A two-dimensional echocardiogram is pending.  Assessment/Plan:  No evidence at this point to implicate his heart in these symptoms.  I suspect his chest pain is noncardiac and is related to his COPD. His EKGs are normal. Await results of 2D echo. No further inpatient cardiac workup needed at this time.  Attention to changes in lifestyle (smoking, drinking) will be most important.  Cassell Clement 12/25/2011, 11:27 AM

## 2011-12-25 NOTE — ED Provider Notes (Signed)
Medical screening examination/treatment/procedure(s) were performed by non-physician practitioner and as supervising physician I was immediately available for consultation/collaboration.  Dwayna Kentner T Yariah Selvey, MD 12/25/11 0754 

## 2011-12-25 NOTE — Progress Notes (Signed)
  Echocardiogram 2D Echocardiogram has been performed.  Philip Coleman 12/25/2011, 2:28 PM

## 2011-12-25 NOTE — Progress Notes (Signed)
Subjective: Chest pain shortness of breath are improved today.  Objective: Vital signs in last 24 hours: Temp:  [97.4 F (36.3 C)-98.4 F (36.9 C)] 98.2 F (36.8 C) (08/18 0553) Pulse Rate:  [72-101] 74  (08/18 0553) Resp:  [17-28] 18  (08/18 0553) BP: (93-120)/(58-79) 93/64 mmHg (08/18 0553) SpO2:  [87 %-99 %] 99 % (08/18 0855) Weight:  [47.4 kg (104 lb 8 oz)-49 kg (108 lb 0.4 oz)] 49 kg (108 lb 0.4 oz) (08/18 0553) Weight change:  Last BM Date: 12/23/11  Intake/Output from previous day: 08/17 0701 - 08/18 0700 In: 1225 [P.O.:600; I.V.:625] Out: 625 [Urine:625]     Physical Exam: General: Alert, awake, oriented x3, cachectic. HEENT: No bruits, no goiter. Heart: Regular rate and rhythm, without murmurs, rubs, gallops. Lungs: Clear to auscultation bilaterally. Abdomen: Soft, nontender, nondistended, positive bowel sounds. Extremities: No clubbing cyanosis or edema with positive pedal pulses. Neuro: Grossly intact, nonfocal.    Lab Results: Basic Metabolic Panel:  Basename 12/25/11 0145 12/24/11 1208  NA 138 139  K 4.0 3.8  CL 101 99  CO2 28 30  GLUCOSE 135* 145*  BUN 15 11  CREATININE 0.70 0.71  CALCIUM 9.2 10.0  MG -- --  PHOS -- --   CBC:  Basename 12/25/11 0145 12/24/11 1208  WBC 9.1 11.0*  NEUTROABS -- --  HGB 13.1 15.5  HCT 39.4 45.9  MCV 88.7 88.8  PLT 369 379   Cardiac Enzymes:  Basename 12/25/11 0810 12/25/11 0151 12/24/11 2000  CKTOTAL 38 32 33  CKMB 1.8 1.5 1.2  CKMBINDEX -- -- --  TROPONINI <0.30 <0.30 <0.30   D-Dimer:  Basename 12/24/11 1349  DDIMER 1.09*   Thyroid Function Tests:  Basename 12/25/11 0145  TSH 1.917  T4TOTAL --  FREET4 --  T3FREE --  THYROIDAB --    Studies/Results: Dg Chest 2 View  12/24/2011  *RADIOLOGY REPORT*  Clinical Data: Chest pain and shortness of breath.  History of collapsed lungs, smoking.  CHEST - 2 VIEW  Comparison: 12/18/2011  Findings: Lungs are hyperinflated.  There are coarse parenchymal  markings and emphysematous changes bilaterally.  No focal consolidations.  Possible small right pleural effusions versus pleural thickening.  Old rib fractures are noted.  Surgical clips are identified in the left upper quadrant.  IMPRESSION:  1.  Emphysematous changes. 2. No evidence for acute pulmonary abnormality.  Original Report Authenticated By: Patterson Hammersmith, M.D.   Ct Angio Chest Pe W/cm &/or Wo Cm  12/24/2011  *RADIOLOGY REPORT*  Clinical Data: Shortness of breath and chest pain.  Symptoms are getting worse.  Pain radiates down left-sided chest.  History of COPD, Hodgkin's disease, splenectomy.  CT ANGIOGRAPHY CHEST  Technique:  Multidetector CT imaging of the chest using the standard protocol during bolus administration of intravenous contrast. Multiplanar reconstructed images including MIPs were obtained and reviewed to evaluate the vascular anatomy.  Contrast: OMNIPAQUE IOHEXOL 350 MG/ML SOLN  Comparison: CT of the chest with contrast 09/17/2011  Findings: The pulmonary arteries are well opacified.  There is no evidence for acute pulmonary embolus.  The heart size is normal.  There is atherosclerotic calcification of the thoracic aorta and coronary arteries. No mediastinal, hilar, or axillary adenopathy.  The visualized portion of the thyroid gland has a normal appearance.  There are diffuse emphysematous changes.  Scarring and density in the right upper lobe appears stable over multiple prior studies.  Images of the upper abdomen show postoperative changes in the left upper quadrant  posterior to the stomach.  There is elevation of the right hemidiaphragm. Degenerative changes are seen in the thoracic spine.  IMPRESSION:  1.  Diffuse emphysematous changes and right upper lobe density, stable in appearance. 2.  Technically adequate exam showing no evidence for acute pulmonary embolus.  Original Report Authenticated By: Patterson Hammersmith, M.D.    Medications: Scheduled Meds:   .  albuterol  2.5 mg Nebulization TID  . aspirin EC  81 mg Oral Daily  . budesonide-formoterol  2 puff Inhalation BID  . enoxaparin (LOVENOX) injection  40 mg Subcutaneous Q24H  . folic acid  1 mg Oral Daily  . ipratropium  0.5 mg Nebulization TID  . ketorolac  30 mg Intravenous Once  . levofloxacin  500 mg Oral Q24H  . methylPREDNISolone (SOLU-MEDROL) injection  60 mg Intravenous Q6H  .  morphine injection  4 mg Intravenous Once  . ondansetron  4 mg Intravenous Once  . sodium chloride  3 mL Intravenous Q12H  . thiamine  100 mg Oral Daily  . DISCONTD: albuterol  2.5 mg Nebulization Q6H WA  . DISCONTD: ipratropium  0.5 mg Nebulization Q6H   Continuous Infusions:   . sodium chloride 75 mL/hr at 12/25/11 1229  . DISCONTD: albuterol 10 mg/hr (12/24/11 1458)   PRN Meds:.acetaminophen, acetaminophen, albuterol, iohexol, morphine injection, ondansetron (ZOFRAN) IV, ondansetron, oxyCODONE, senna-docusate, DISCONTD: oxyCODONE  Assessment/Plan:  Principal Problem:  *Respiratory failure, acute-on-chronic Active Problems:  ETOH abuse  COPD with acute exacerbation  Chest pain  Hypoxemia  Tobacco abuse   #1 acute on chronic respiratory failure secondary to COPD with acute exacerbation: Air movement is improved on chest auscultation today. Continue steroids, breathing treatments and empiric antibiotics. I believe that he will now require oxygen long-term.  #2 chest pain: Somewhat improved. He did have some ST changes on EKG so I requested a cardiology consultation which is greatly appreciated. Their recommendations is that the changes are not ischemic in nature and to await results of 2-D echocardiogram. So far 2 sets of cardiac enzymes are negative.  #3 tobacco abuse: Patient is still a smoker and is not interested in quitting at this time.  #4 alcohol abuse: Has been counseled. Monitor for withdrawals. Continue thiamine and folate.  #5 disposition: He will need extensive care management  assistance at discharge with home health oxygen set up as well as good medical followup at time of discharge.    LOS: 1 day   [facility] Pager: [phone] 12/25/2011, 12:47 PM

## 2011-12-25 NOTE — ED Provider Notes (Signed)
Medical screening examination/treatment/procedure(s) were performed by non-physician practitioner and as supervising physician I was immediately available for consultation/collaboration.   Carleene Cooper III, MD 12/25/11 1350

## 2011-12-25 NOTE — Progress Notes (Signed)
Pt ambulated down the hall and back. O2 sats dropped down to 88% at the end of the walk.

## 2011-12-26 DIAGNOSIS — J9620 Acute and chronic respiratory failure, unspecified whether with hypoxia or hypercapnia: Principal | ICD-10-CM

## 2011-12-26 MED ORDER — FOLIC ACID 1 MG PO TABS: 1.0000 mg | ORAL_TABLET | Freq: Every day | ORAL | Status: DC

## 2011-12-26 MED ORDER — TIOTROPIUM BROMIDE MONOHYDRATE 18 MCG IN CAPS: 18.0000 ug | ORAL_CAPSULE | Freq: Every day | RESPIRATORY_TRACT | Status: DC

## 2011-12-26 MED ORDER — TIOTROPIUM BROMIDE MONOHYDRATE 18 MCG IN CAPS
18.0000 ug | ORAL_CAPSULE | Freq: Every day | RESPIRATORY_TRACT | Status: DC
  Filled 2011-12-26: qty 5

## 2011-12-26 MED ORDER — OXYCODONE HCL 5 MG PO TABS: 5.0000 mg | ORAL_TABLET | ORAL | Status: AC | PRN

## 2011-12-26 MED ORDER — LEVOFLOXACIN 500 MG PO TABS: 500.0000 mg | ORAL_TABLET | ORAL | Status: AC

## 2011-12-26 MED ORDER — PREDNISONE (PAK) 10 MG PO TABS: 10.0000 mg | ORAL_TABLET | Freq: Every day | ORAL | Status: AC

## 2011-12-26 MED ORDER — OXYCODONE HCL 5 MG PO TABS: 5.0000 mg | ORAL_TABLET | ORAL | Status: DC | PRN

## 2011-12-26 MED ORDER — THIAMINE HCL 100 MG PO TABS: 100.0000 mg | ORAL_TABLET | Freq: Every day | ORAL | Status: DC

## 2011-12-26 NOTE — Care Management Note (Signed)
    Page 1 of 1   12/26/2011     2:49:37 PM   CARE MANAGEMENT NOTE 12/26/2011  Patient:  ECHO, ALLSBROOK   Account Number:  [id number]  Date Initiated:  12/26/2011  Documentation initiated by:  Fransico Michael  Subjective/Objective Assessment:   admitted on 12/24/11 with c/o chest pain and respiratory failure     Action/Plan:   prior to admission, patient lived in a boarding house and was independent with ADLs.   Anticipated DC Date:  12/26/2011   Anticipated DC Plan:  HOME/SELF CARE      DC Planning Services  CM consult  PCP issues      [facility] Choice  HOME HEALTH   Choice offered to / List presented to:  C-1 Patient        HH arranged  HH-1 RN  HH-6 SOCIAL WORKER      HH agency  [facility]   Status of service:  Completed, signed off Medicare Important Message given?   (If response is "NO", the following Medicare IM given date fields will be blank) Date Medicare IM given:   Date Additional Medicare IM given:    Discharge Disposition:  HOME W HOME HEALTH SERVICES  Per UR Regulation:  Reviewed for med. necessity/level of care/duration of stay  If discussed at Long Length of Stay Meetings, dates discussed:    Comments:  12/26/11-1007-J.Adah Stoneberg,RN,BSN [phone]      In to speak with patient regarding home health needs. Reports having home Oxygen therapy managed by [facility] for use at night. Choice of agencies given to patient for home health RN and CSW services. Reports that he would like to stay with [facility]. Patient reports gives contact number of [phone] for home health workers. Will notify Marie,RN of new referral. No further needs identified. Patient to be discharged home today.

## 2011-12-26 NOTE — Clinical Social Work Psychosocial (Signed)
Clinical Social Work Department BRIEF PSYCHOSOCIAL ASSESSMENT 12/26/2011  Patient:  Philip Coleman, Philip Coleman     Account Number:  [id number]     Admit date:  12/24/2011  Clinical Social Worker:  Juliette Mangle  Date/Time:  12/26/2011 11:30 AM  Referred by:  Physician  Date Referred:  12/26/2011 Referred for  Substance Abuse   Other Referral:   Interview type:  Patient Other interview type:    PSYCHOSOCIAL DATA Living Status:  ALONE Admitted from facility:   Level of care:   Primary support name:  Mabe,Lee Primary support relationship to patient:  FRIEND Degree of support available:   Poor    CURRENT CONCERNS Current Concerns  Substance Abuse   Other Concerns:    SOCIAL WORK ASSESSMENT / PLAN CSW received a referral for ETOH abuse. CSW met patient at bedside and introduced self, explained role and discussed patient's ETOH use. Patient became very angry and reported, "I am not an alcoholic and I don't appreciate people coming to talk to me about my drinking, who told you to come talk to me?" Patient reported that he hasn't  had a drink in a month and that he only drinks ice tea because it hurts his stomach. CSW offered patient resources but patient was not interested in SA resources. CSW did discuss patient's use of tobacco. Patient reported that he used to smoke three packs a day but now only smokes 3 cigarettes daily. CSW praised patient on his ability to cut back his use  but re-iterated the importance of continuing to cut back. Patient acknowledged this and reported that he would continue to work on cutting back his use.   Assessment/plan status:  No Further Intervention Required Other assessment/ plan:   Information/referral to community resources:   Patient was not interested in SA resources    PATIENT'S/FAMILY'S RESPONSE TO PLAN OF CARE: Patient was upset with SA referral and did not want to discuss his drinking. However, patient did discuss his use of tobacco and was  appreciative of support and information provided by CSW. CSW will sign off as social work intervention is no longer needed at this time     Sabino Niemann, MSW, [employer] [phone]

## 2011-12-26 NOTE — Discharge Summary (Signed)
Physician Discharge Summary  Patient ID: Philip Coleman MRN: [id number] DOB/AGE: [birth year] 54 y.o.  Admit date: 12/24/2011 Discharge date: 12/26/2011  Primary Care Physician:  Franchot Mimes, MD   Discharge Diagnoses:    Principal Problem:  *Respiratory failure, acute-on-chronic Active Problems:  ETOH abuse  COPD with acute exacerbation  Chest pain  Hypoxemia  Tobacco abuse    Medication List  As of 12/26/2011 10:24 AM   STOP taking these medications         oxyCODONE-acetaminophen 5-325 MG per tablet         TAKE these medications         albuterol 108 (90 BASE) MCG/ACT inhaler   Commonly known as: PROVENTIL HFA;VENTOLIN HFA   Inhale 2 puffs into the lungs every 6 (six) hours as needed. wheezing      budesonide-formoterol 160-4.5 MCG/ACT inhaler   Commonly known as: SYMBICORT   Inhale 2 puffs into the lungs 2 (two) times daily.      folic acid 1 MG tablet   Commonly known as: FOLVITE   Take 1 tablet (1 mg total) by mouth daily.      levofloxacin 500 MG tablet   Commonly known as: LEVAQUIN   Take 1 tablet (500 mg total) by mouth daily.      loperamide 2 MG capsule   Commonly known as: IMODIUM   Take 2 mg by mouth 4 (four) times daily as needed. For diarrhea      oxyCODONE 5 MG immediate release tablet   Commonly known as: Oxy IR/ROXICODONE   Take 1 tablet (5 mg total) by mouth every 4 (four) hours as needed.      predniSONE 10 MG tablet   Commonly known as: STERAPRED UNI-PAK   Take 1 tablet (10 mg total) by mouth daily. Take 6 tablets today and then decrease by one tablet daily until no more pills left.      thiamine 100 MG tablet   Take 1 tablet (100 mg total) by mouth daily.      tiotropium 18 MCG inhalation capsule   Commonly known as: SPIRIVA   Place 1 capsule (18 mcg total) into inhaler and inhale daily.             Disposition and Follow-up:  Discharged home in stable and improved condition. Will need to to followup with his  pulmonologist in no more than 4 weeks.  Consults:  cardiology Dr. Patty Sermons   Significant Diagnostic Studies:  Dg Chest 2 View  12/24/2011  *RADIOLOGY REPORT*  Clinical Data: Chest pain and shortness of breath.  History of collapsed lungs, smoking.  CHEST - 2 VIEW  Comparison: 12/18/2011  Findings: Lungs are hyperinflated.  There are coarse parenchymal markings and emphysematous changes bilaterally.  No focal consolidations.  Possible small right pleural effusions versus pleural thickening.  Old rib fractures are noted.  Surgical clips are identified in the left upper quadrant.  IMPRESSION:  1.  Emphysematous changes. 2. No evidence for acute pulmonary abnormality.  Original Report Authenticated By: Patterson Hammersmith, M.D.   Ct Angio Chest Pe W/cm &/or Wo Cm  12/24/2011  *RADIOLOGY REPORT*  Clinical Data: Shortness of breath and chest pain.  Symptoms are getting worse.  Pain radiates down left-sided chest.  History of COPD, Hodgkin's disease, splenectomy.  CT ANGIOGRAPHY CHEST  Technique:  Multidetector CT imaging of the chest using the standard protocol during bolus administration of intravenous contrast. Multiplanar reconstructed images including MIPs were obtained and reviewed to evaluate  the vascular anatomy.  Contrast: OMNIPAQUE IOHEXOL 350 MG/ML SOLN  Comparison: CT of the chest with contrast 09/17/2011  Findings: The pulmonary arteries are well opacified.  There is no evidence for acute pulmonary embolus.  The heart size is normal.  There is atherosclerotic calcification of the thoracic aorta and coronary arteries. No mediastinal, hilar, or axillary adenopathy.  The visualized portion of the thyroid gland has a normal appearance.  There are diffuse emphysematous changes.  Scarring and density in the right upper lobe appears stable over multiple prior studies.  Images of the upper abdomen show postoperative changes in the left upper quadrant posterior to the stomach.  There is elevation of the  right hemidiaphragm. Degenerative changes are seen in the thoracic spine.  IMPRESSION:  1.  Diffuse emphysematous changes and right upper lobe density, stable in appearance. 2.  Technically adequate exam showing no evidence for acute pulmonary embolus.  Original Report Authenticated By: Patterson Hammersmith, M.D.    Brief H and P: For complete details please refer to admission H and P, but in brief patient is a 56 year old white man with a history of severe COPD, alcohol ans tobacco abuse who was admitted given it was his third admission for chest pain and shortness of breath. He supposedly is on 1 L of oxygen at home and is very non-compliant with both this and his medications. Has not had any followup since he was discharged from the hospital in may. We are asked to admit him for further evaluation and management.    Hospital Course:  Principal Problem:  *Respiratory failure, acute-on-chronic Active Problems:  ETOH abuse  COPD with acute exacerbation  Chest pain  Hypoxemia  Tobacco abuse   #1 acute on chronic respiratory failure secondary to COPD with acute exacerbation: Is much improved today. He has good air movement on chest exam. We'll discharge him on a prednisone taper, empiric antibiotic course, Symbicort, Spiriva, albuterol as a rescue inhaler as well as 2 L of O2 continuously.  #2 chest pain: Improved with treatment of his COPD exacerbation. He did have some diffuse ST changes on EKG so I requested a cardiology consultation. They believe that the changes are not ischemic in nature and that his chest pain is pulmonary in origin. Appreciate cardiology recommendations.  #3 tobacco abuse: Patient has been counseled extensively does not appear to be interested in quitting at this time.  #4 alcohol abuse: Cessation counseling provided. Will be discharged on thiamine and folate.  Time spent on Discharge: Greater than 30 minutes.  SignedChaya Jan [facility] Pager: [phone] 12/26/2011, 10:24 AM

## 2011-12-26 NOTE — Progress Notes (Signed)
Subjective:  Patient feels better. Less dyspnea.  Objective:  Vital Signs in the last 24 hours: Temp:  [97.7 F (36.5 C)-98.2 F (36.8 C)] 98.2 F (36.8 C) (08/19 0513) Pulse Rate:  [60-75] 75  (08/19 0513) Resp:  [19-20] 20  (08/19 0513) BP: (96-100)/(59-65) 100/65 mmHg (08/19 0513) SpO2:  [83 %-99 %] 94 % (08/19 0513) Weight:  [115 lb 15.4 oz (52.6 kg)] 115 lb 15.4 oz (52.6 kg) (08/19 0513)  Intake/Output from previous day: 08/18 0701 - 08/19 0700 In: 2340 [P.O.:540; I.V.:1800] Out: 1350 [Urine:1350] Intake/Output from this shift:       . albuterol  2.5 mg Nebulization TID  . aspirin EC  81 mg Oral Daily  . budesonide-formoterol  2 puff Inhalation BID  . enoxaparin (LOVENOX) injection  40 mg Subcutaneous Q24H  . folic acid  1 mg Oral Daily  . ipratropium  0.5 mg Nebulization TID  . levofloxacin  500 mg Oral Q24H  . methylPREDNISolone (SOLU-MEDROL) injection  60 mg Intravenous Q6H  . sodium chloride  3 mL Intravenous Q12H  . thiamine  100 mg Oral Daily  . DISCONTD: albuterol  2.5 mg Nebulization Q6H WA  . DISCONTD: ipratropium  0.5 mg Nebulization Q6H      . sodium chloride 75 mL/hr at 12/26/11 0208    Physical Exam: The patient appears to be in no distress. Cachectic.  Head and neck exam reveals that the pupils are equal and reactive.  The extraocular movements are full.  There is no scleral icterus.  Mouth and pharynx are benign.  No lymphadenopathy.  No carotid bruits.  The jugular venous pressure is normal.  Thyroid is not enlarged or tender.  Chest reveals distant breath sounds, minimal wheeze on expiration.  Heart reveals no abnormal lift or heave.  First and second heart sounds are normal.  There is no murmur gallop rub or click.  The abdomen is soft and nontender.  Bowel sounds are normoactive.  There is no hepatosplenomegaly or mass.  There are no abdominal bruits.  Extremities reveal no phlebitis or edema.  Pedal pulses are good.  There is no  cyanosis or clubbing.  Neurologic exam is normal strength and no lateralizing weakness.  No sensory deficits.  Integument reveals no rash  Lab Results:  Basename 12/25/11 0145 12/24/11 1208  WBC 9.1 11.0*  HGB 13.1 15.5  PLT 369 379    Basename 12/25/11 0145 12/24/11 1208  NA 138 139  K 4.0 3.8  CL 101 99  CO2 28 30  GLUCOSE 135* 145*  BUN 15 11  CREATININE 0.70 0.71    Basename 12/25/11 0810 12/25/11 0151  TROPONINI <0.30 <0.30   Hepatic Function Panel No results found for this basename: PROT,ALBUMIN,AST,ALT,ALKPHOS,BILITOT,BILIDIR,IBILI in the last 72 hours No results found for this basename: CHOL in the last 72 hours No results found for this basename: PROTIME in the last 72 hours  Imaging: Dg Chest 2 View  12/24/2011  *RADIOLOGY REPORT*  Clinical Data: Chest pain and shortness of breath.  History of collapsed lungs, smoking.  CHEST - 2 VIEW  Comparison: 12/18/2011  Findings: Lungs are hyperinflated.  There are coarse parenchymal markings and emphysematous changes bilaterally.  No focal consolidations.  Possible small right pleural effusions versus pleural thickening.  Old rib fractures are noted.  Surgical clips are identified in the left upper quadrant.  IMPRESSION:  1.  Emphysematous changes. 2. No evidence for acute pulmonary abnormality.  Original Report Authenticated By: Patterson Hammersmith, M.D.  Ct Angio Chest Pe W/cm &/or Wo Cm  12/24/2011  *RADIOLOGY REPORT*  Clinical Data: Shortness of breath and chest pain.  Symptoms are getting worse.  Pain radiates down left-sided chest.  History of COPD, Hodgkin's disease, splenectomy.  CT ANGIOGRAPHY CHEST  Technique:  Multidetector CT imaging of the chest using the standard protocol during bolus administration of intravenous contrast. Multiplanar reconstructed images including MIPs were obtained and reviewed to evaluate the vascular anatomy.  Contrast: OMNIPAQUE IOHEXOL 350 MG/ML SOLN  Comparison: CT of the chest with  contrast 09/17/2011  Findings: The pulmonary arteries are well opacified.  There is no evidence for acute pulmonary embolus.  The heart size is normal.  There is atherosclerotic calcification of the thoracic aorta and coronary arteries. No mediastinal, hilar, or axillary adenopathy.  The visualized portion of the thyroid gland has a normal appearance.  There are diffuse emphysematous changes.  Scarring and density in the right upper lobe appears stable over multiple prior studies.  Images of the upper abdomen show postoperative changes in the left upper quadrant posterior to the stomach.  There is elevation of the right hemidiaphragm. Degenerative changes are seen in the thoracic spine.  IMPRESSION:  1.  Diffuse emphysematous changes and right upper lobe density, stable in appearance. 2.  Technically adequate exam showing no evidence for acute pulmonary embolus.  Original Report Authenticated By: Patterson Hammersmith, M.D.    Cardiac Studies: 2D echo shows normal EF and no wall motion abnormalities.  Assessment/Plan:  Noncardiac chest pain most likely secondary to his severe COPD  Plan: No further cardiac tests planned. Will sign off now.   LOS: 2 days    Cassell Clement 12/26/2011, 7:35 AM

## 2011-12-26 NOTE — Progress Notes (Signed)
IV d/c'd by pt.  Tele d/c'd.  Home meds and d/c instructions have been discussed with pt.  Pt denies any questions or concerns at this time.  Pt ambulating off unit and appears in no acute distress. Nino Glow RN

## 2011-12-27 NOTE — Progress Notes (Signed)
Retro ur ins review. 

## 2012-01-09 ENCOUNTER — Encounter (HOSPITAL_COMMUNITY): Payer: Self-pay | Admitting: Family Medicine

## 2012-01-09 ENCOUNTER — Emergency Department (HOSPITAL_COMMUNITY)
Admission: EM | Admit: 2012-01-09 | Discharge: 2012-01-10 | Disposition: A | Discharge status: Home / Self Care | Payer: Medicare Other | Attending: Emergency Medicine | Admitting: Emergency Medicine

## 2012-01-09 ENCOUNTER — Emergency Department (HOSPITAL_COMMUNITY): Payer: Medicare Other

## 2012-01-09 DIAGNOSIS — R071 Chest pain on breathing: Secondary | ICD-10-CM

## 2012-01-09 DIAGNOSIS — Z9089 Acquired absence of other organs: Secondary | ICD-10-CM | POA: Insufficient documentation

## 2012-01-09 DIAGNOSIS — F17200 Nicotine dependence, unspecified, uncomplicated: Secondary | ICD-10-CM | POA: Insufficient documentation

## 2012-01-09 DIAGNOSIS — J4489 Other specified chronic obstructive pulmonary disease: Secondary | ICD-10-CM | POA: Insufficient documentation

## 2012-01-09 DIAGNOSIS — J449 Chronic obstructive pulmonary disease, unspecified: Secondary | ICD-10-CM | POA: Insufficient documentation

## 2012-01-09 DIAGNOSIS — C8190 Hodgkin lymphoma, unspecified, unspecified site: Secondary | ICD-10-CM | POA: Insufficient documentation

## 2012-01-09 DIAGNOSIS — E079 Disorder of thyroid, unspecified: Secondary | ICD-10-CM | POA: Insufficient documentation

## 2012-01-09 DIAGNOSIS — Z79899 Other long term (current) drug therapy: Secondary | ICD-10-CM | POA: Insufficient documentation

## 2012-01-09 LAB — CBC WITH DIFFERENTIAL/PLATELET
Basophils Relative: 0 % (ref 0–1)
Eosinophils Absolute: 0.2 10*3/uL (ref 0.0–0.7)
Eosinophils Relative: 2 % (ref 0–5)
HCT: 46.8 % (ref 39.0–52.0)
Hemoglobin: 15.5 g/dL (ref 13.0–17.0)
MCH: 29.5 pg (ref 26.0–34.0)
MCHC: 33.1 g/dL (ref 30.0–36.0)
MCV: 89.1 fL (ref 78.0–100.0)
Monocytes Absolute: 1.4 10*3/uL — ABNORMAL HIGH (ref 0.1–1.0)
Monocytes Relative: 10 % (ref 3–12)

## 2012-01-09 LAB — COMPREHENSIVE METABOLIC PANEL
Albumin: 3.1 g/dL — ABNORMAL LOW (ref 3.5–5.2)
BUN: 20 mg/dL (ref 6–23)
Calcium: 9.4 mg/dL (ref 8.4–10.5)
Chloride: 94 mEq/L — ABNORMAL LOW (ref 96–112)
Creatinine, Ser: 0.76 mg/dL (ref 0.50–1.35)
Total Bilirubin: 0.2 mg/dL — ABNORMAL LOW (ref 0.3–1.2)
Total Protein: 7.1 g/dL (ref 6.0–8.3)

## 2012-01-09 LAB — POCT I-STAT TROPONIN I: Troponin i, poc: 0 ng/mL (ref 0.00–0.08)

## 2012-01-09 MED ORDER — ALBUTEROL SULFATE (5 MG/ML) 0.5% IN NEBU
2.5000 mg | INHALATION_SOLUTION | RESPIRATORY_TRACT | Status: DC
  Administered 2012-01-09: 2.5 mg via RESPIRATORY_TRACT
  Filled 2012-01-09: qty 0.5

## 2012-01-09 MED ORDER — OXYCODONE-ACETAMINOPHEN 5-325 MG PO TABS
2.0000 | ORAL_TABLET | Freq: Once | ORAL | Status: AC
  Administered 2012-01-09: 2 via ORAL
  Filled 2012-01-09: qty 2

## 2012-01-09 MED ORDER — IPRATROPIUM BROMIDE 0.02 % IN SOLN
0.5000 mg | RESPIRATORY_TRACT | Status: DC
  Administered 2012-01-09: 0.5 mg via RESPIRATORY_TRACT
  Filled 2012-01-09: qty 2.5

## 2012-01-09 NOTE — ED Notes (Signed)
Pt sts SOB and chest pain since he ran out of meds. Pt recently here and was on meds for COPD. sts he still has the 2 inhalers but is out of everything else.

## 2012-01-10 MED ORDER — OXYCODONE-ACETAMINOPHEN 5-325 MG PO TABS: 1.0000 | ORAL_TABLET | Freq: Four times a day (QID) | ORAL | Status: DC | PRN

## 2012-01-10 MED ORDER — PREDNISONE 20 MG PO TABS: ORAL_TABLET | ORAL | Status: DC

## 2012-01-20 ENCOUNTER — Emergency Department (HOSPITAL_COMMUNITY): Payer: Medicare Other

## 2012-01-20 ENCOUNTER — Encounter (HOSPITAL_COMMUNITY): Payer: Self-pay | Admitting: *Deleted

## 2012-01-20 ENCOUNTER — Inpatient Hospital Stay (HOSPITAL_COMMUNITY)
Admission: EM | Admit: 2012-01-20 | Discharge: 2012-01-23 | DRG: 193 | Disposition: A | Discharge status: Home / Self Care | Payer: Medicare Other | Attending: Internal Medicine | Admitting: Internal Medicine

## 2012-01-20 DIAGNOSIS — F17200 Nicotine dependence, unspecified, uncomplicated: Secondary | ICD-10-CM | POA: Diagnosis present

## 2012-01-20 DIAGNOSIS — Z9089 Acquired absence of other organs: Secondary | ICD-10-CM

## 2012-01-20 DIAGNOSIS — J441 Chronic obstructive pulmonary disease with (acute) exacerbation: Secondary | ICD-10-CM | POA: Diagnosis present

## 2012-01-20 DIAGNOSIS — F1010 Alcohol abuse, uncomplicated: Secondary | ICD-10-CM | POA: Diagnosis present

## 2012-01-20 DIAGNOSIS — Z681 Body mass index (BMI) 19 or less, adult: Secondary | ICD-10-CM

## 2012-01-20 DIAGNOSIS — Z8571 Personal history of Hodgkin lymphoma: Secondary | ICD-10-CM

## 2012-01-20 DIAGNOSIS — J9610 Chronic respiratory failure, unspecified whether with hypoxia or hypercapnia: Secondary | ICD-10-CM | POA: Diagnosis present

## 2012-01-20 DIAGNOSIS — Z720 Tobacco use: Secondary | ICD-10-CM | POA: Diagnosis present

## 2012-01-20 DIAGNOSIS — R64 Cachexia: Secondary | ICD-10-CM | POA: Diagnosis present

## 2012-01-20 DIAGNOSIS — E43 Unspecified severe protein-calorie malnutrition: Secondary | ICD-10-CM | POA: Diagnosis present

## 2012-01-20 DIAGNOSIS — Z79899 Other long term (current) drug therapy: Secondary | ICD-10-CM

## 2012-01-20 DIAGNOSIS — J189 Pneumonia, unspecified organism: Principal | ICD-10-CM | POA: Diagnosis present

## 2012-01-20 DIAGNOSIS — J449 Chronic obstructive pulmonary disease, unspecified: Secondary | ICD-10-CM

## 2012-01-20 DIAGNOSIS — J439 Emphysema, unspecified: Secondary | ICD-10-CM | POA: Diagnosis present

## 2012-01-20 HISTORY — DX: Chronic respiratory failure, unspecified whether with hypoxia or hypercapnia: J96.10

## 2012-01-20 LAB — BASIC METABOLIC PANEL
BUN: 15 mg/dL (ref 6–23)
CO2: 31 mEq/L (ref 19–32)
Chloride: 99 mEq/L (ref 96–112)
Glucose, Bld: 94 mg/dL (ref 70–99)
Potassium: 4.4 mEq/L (ref 3.5–5.1)
Sodium: 141 mEq/L (ref 135–145)

## 2012-01-20 LAB — CBC
HCT: 48.1 % (ref 39.0–52.0)
Hemoglobin: 16.4 g/dL (ref 13.0–17.0)
RBC: 5.44 MIL/uL (ref 4.22–5.81)
WBC: 15.6 10*3/uL — ABNORMAL HIGH (ref 4.0–10.5)

## 2012-01-20 LAB — POCT I-STAT TROPONIN I

## 2012-01-20 MED ORDER — MORPHINE SULFATE 4 MG/ML IJ SOLN
4.0000 mg | Freq: Once | INTRAMUSCULAR | Status: AC
  Administered 2012-01-20: 4 mg via INTRAVENOUS
  Filled 2012-01-20 (×2): qty 1

## 2012-01-20 MED ORDER — VANCOMYCIN HCL IN DEXTROSE 1-5 GM/200ML-% IV SOLN
1000.0000 mg | Freq: Once | INTRAVENOUS | Status: AC
  Administered 2012-01-21: 1000 mg via INTRAVENOUS
  Filled 2012-01-20: qty 200

## 2012-01-20 MED ORDER — DEXTROSE 5 % IV SOLN
1.0000 g | Freq: Once | INTRAVENOUS | Status: AC
  Administered 2012-01-20: 1 g via INTRAVENOUS
  Filled 2012-01-20: qty 10

## 2012-01-20 MED ORDER — PIPERACILLIN-TAZOBACTAM 3.375 G IVPB: 3.3750 g | Freq: Once | INTRAVENOUS | Status: DC

## 2012-01-20 NOTE — ED Provider Notes (Signed)
History     CSN: [id number]  Arrival date & time 01/09/12  1617   First MD Initiated Contact with Patient 01/09/12 2109      Chief Complaint  Patient presents with  . Shortness of Breath  . Chest Pain    (Consider location/radiation/quality/duration/timing/severity/associated sxs/prior treatment) HPI Comments: Philip Coleman 56 y.o. male   The chief complaint is: Patient presents with:   Shortness of Breath   Chest Pain    Patient with history of COPD, Hodgkin's disease and spontaneous pneumothorax presents with cc of chest pain and sob.  This is a chronic condition for him.  He is well known to this facility and has been evaluated for the same complaint many times before. C/o pain in right side of right sided CP. Pleuritic in nature, associated with SOB.   deinies nausea and vomiting.  Denies fever , new onset cough.  Pain has been constant for some months. Rates pain at 10/10. Patient is out of pain meds and is waiting to get in with his pulmonologist at [facility]. He states that he can be a difficult patient if he does not get his pain medicines.      Patient is a 55 y.o. male presenting with shortness of breath and chest pain. The history is provided by the patient and medical records.  Shortness of Breath  The current episode started more than 2 weeks ago. The problem occurs continuously. The problem has been unchanged. The problem is severe. The symptoms are relieved by one or more prescription drugs. Associated symptoms include chest pain, cough and shortness of breath. Pertinent negatives include no chest pressure, no orthopnea, no fever, no rhinorrhea, no sore throat and no wheezing. He has not inhaled smoke recently. He has had no prior hospitalizations. He has had no prior ICU admissions. He has had no prior intubations.  Chest Pain Primary symptoms include shortness of breath and cough. Pertinent negatives for primary symptoms include no fever, no wheezing, no  palpitations, no abdominal pain and no nausea.  Pertinent negatives for associated symptoms include no orthopnea.     Past Medical History  Diagnosis Date  . Thyroid disease     hyper, not on meds  . COPD (chronic obstructive pulmonary disease)   . Hodgkin's disease 1993    with abdominal surgical scar -pt unable to describe   . Spontaneous pneumothorax     left  . Spontaneous pneumothorax     right  . Pneumonia     Past Surgical History  Procedure Date  . Splenectomy   . Chest tube insertion     left  . Chest tube insertion     right    History reviewed. No pertinent family history.  History  Substance Use Topics  . Smoking status: Current Every Day Smoker -- 0.2 packs/day for 41 years    Types: Cigarettes  . Smokeless tobacco: Not on file  . Alcohol Use: 3.6 oz/week    6 Cans of beer per week      Review of Systems  Constitutional: Negative for fever.  HENT: Negative for sore throat, rhinorrhea and neck pain.   Respiratory: Positive for cough and shortness of breath. Negative for wheezing.   Cardiovascular: Positive for chest pain. Negative for palpitations, orthopnea and leg swelling.  Gastrointestinal: Negative for nausea, abdominal pain, diarrhea and abdominal distention.  Genitourinary: Negative for dysuria.  Skin: Negative for rash.  Neurological: Negative for headaches.    Allergies  Codeine  Home Medications   Current Outpatient Rx  Name Route Sig Dispense Refill  . ALBUTEROL SULFATE HFA 108 (90 BASE) MCG/ACT IN AERS Inhalation Inhale 2 puffs into the lungs every 6 (six) hours as needed. wheezing    . BUDESONIDE-FORMOTEROL FUMARATE 160-4.5 MCG/ACT IN AERO Inhalation Inhale 2 puffs into the lungs 2 (two) times daily.    [name] FOLIC ACID 1 MG PO TABS Oral Take 1 tablet (1 mg total) by mouth daily.    [name] LOPERAMIDE HCL 2 MG PO CAPS Oral Take 2 mg by mouth 4 (four) times daily as needed. For diarrhea    . THIAMINE HCL 100 MG PO TABS Oral Take 1 tablet  (100 mg total) by mouth daily.    [name] TIOTROPIUM BROMIDE MONOHYDRATE 18 MCG IN CAPS Inhalation Place 1 capsule (18 mcg total) into inhaler and inhale daily. 30 capsule 1  . OXYCODONE-ACETAMINOPHEN 5-325 MG PO TABS Oral Take 1 tablet by mouth every 6 (six) hours as needed for pain. 20 tablet 0  . PREDNISONE 20 MG PO TABS  3 tabs po day one, then 2 po daily x 4 days 11 tablet 0    BP 115/70  Pulse 82  Temp 98.1 F (36.7 C) (Oral)  Resp 15  SpO2 95%  Physical Exam  Constitutional: He appears cachectic. He has a sickly appearance.       Cachectic, sickly man with "pink puffer" appearance.  HENT:  Head: Normocephalic and atraumatic.  Eyes: Conjunctivae normal are normal.  Neck: Normal range of motion. Neck supple. No thyromegaly present.  Cardiovascular: Normal rate, regular rhythm, normal heart sounds and intact distal pulses.   Pulmonary/Chest: Accessory muscle usage present. No respiratory distress. He has no wheezes. He has no rales. He exhibits no tenderness.  Abdominal: Soft. Bowel sounds are normal. There is no tenderness. There is no guarding.    ED Course  Procedures (including critical care time)  Labs Reviewed  CBC WITH DIFFERENTIAL - Abnormal; Notable for the following:    WBC 14.1 (*)     Neutro Abs 9.6 (*)     Monocytes Absolute 1.4 (*)     All other components within normal limits  COMPREHENSIVE METABOLIC PANEL - Abnormal; Notable for the following:    Sodium 132 (*)     Chloride 94 (*)     Glucose, Bld 148 (*)     Albumin 3.1 (*)     Total Bilirubin 0.2 (*)     All other components within normal limits  POCT I-STAT TROPONIN I  ETHANOL  LAB REPORT - SCANNED   No results found.  Patient with negative EKG and troponin.  He has hyponatremia, which is consistant with previous visists and is only slightly low.  CXR is negative for any acute abnormalities.Patient will be d/c with Pulmonolgy f/u and pain medication.  . All questions answered fully.  1. COPD (chronic  obstructive pulmonary disease)   2. Chest pain on respiration       MDM  Discussed reasons to seek immediate care. Patient expresses understanding and agrees with plan.         Arthor Captain, PA-C 01/21/12 1743

## 2012-01-20 NOTE — ED Notes (Signed)
Pt c/o SOB and CP x 3 days, states it is worse today.  Associated with weakness, nausea.

## 2012-01-20 NOTE — ED Notes (Signed)
States pain was staying away but has run out of prednisone.

## 2012-01-21 ENCOUNTER — Encounter (HOSPITAL_COMMUNITY): Payer: Self-pay | Admitting: Family Medicine

## 2012-01-21 DIAGNOSIS — J449 Chronic obstructive pulmonary disease, unspecified: Secondary | ICD-10-CM

## 2012-01-21 DIAGNOSIS — J441 Chronic obstructive pulmonary disease with (acute) exacerbation: Secondary | ICD-10-CM | POA: Diagnosis present

## 2012-01-21 DIAGNOSIS — J189 Pneumonia, unspecified organism: Secondary | ICD-10-CM | POA: Diagnosis present

## 2012-01-21 DIAGNOSIS — R6889 Other general symptoms and signs: Secondary | ICD-10-CM

## 2012-01-21 LAB — CBC
MCH: 29.6 pg (ref 26.0–34.0)
MCHC: 33.7 g/dL (ref 30.0–36.0)
MCV: 87.7 fL (ref 78.0–100.0)
Platelets: 355 10*3/uL (ref 150–400)
RBC: 4.8 MIL/uL (ref 4.22–5.81)

## 2012-01-21 LAB — CANCER ANTIGEN 19-9: CA 19-9: 2.7 U/mL — ABNORMAL LOW (ref <35.0)

## 2012-01-21 LAB — BASIC METABOLIC PANEL
CO2: 32 mEq/L (ref 19–32)
Calcium: 9.6 mg/dL (ref 8.4–10.5)
Creatinine, Ser: 0.71 mg/dL (ref 0.50–1.35)

## 2012-01-21 LAB — PREALBUMIN: Prealbumin: 14.7 mg/dL — ABNORMAL LOW (ref 17.0–34.0)

## 2012-01-21 MED ORDER — ENSURE PUDDING PO PUDG
1.0000 | Freq: Three times a day (TID) | ORAL | Status: DC
  Administered 2012-01-21 – 2012-01-23 (×7): 1 via ORAL

## 2012-01-21 MED ORDER — IPRATROPIUM BROMIDE 0.02 % IN SOLN
0.5000 mg | Freq: Four times a day (QID) | RESPIRATORY_TRACT | Status: DC
  Administered 2012-01-21 – 2012-01-23 (×9): 0.5 mg via RESPIRATORY_TRACT
  Filled 2012-01-21 (×10): qty 2.5

## 2012-01-21 MED ORDER — ONDANSETRON HCL 4 MG/2ML IJ SOLN: 4.0000 mg | Freq: Four times a day (QID) | INTRAMUSCULAR | Status: DC | PRN

## 2012-01-21 MED ORDER — ALUM & MAG HYDROXIDE-SIMETH 200-200-20 MG/5ML PO SUSP: 30.0000 mL | Freq: Four times a day (QID) | ORAL | Status: DC | PRN

## 2012-01-21 MED ORDER — SODIUM CHLORIDE 0.9 % IJ SOLN: 3.0000 mL | INTRAMUSCULAR | Status: DC | PRN

## 2012-01-21 MED ORDER — ACETAMINOPHEN 325 MG PO TABS: 650.0000 mg | ORAL_TABLET | Freq: Four times a day (QID) | ORAL | Status: DC | PRN

## 2012-01-21 MED ORDER — ONDANSETRON HCL 4 MG PO TABS: 4.0000 mg | ORAL_TABLET | Freq: Four times a day (QID) | ORAL | Status: DC | PRN

## 2012-01-21 MED ORDER — VANCOMYCIN HCL 500 MG IV SOLR
500.0000 mg | Freq: Two times a day (BID) | INTRAVENOUS | Status: DC
  Administered 2012-01-21 (×2): 500 mg via INTRAVENOUS
  Filled 2012-01-21 (×3): qty 500

## 2012-01-21 MED ORDER — ALBUTEROL SULFATE (5 MG/ML) 0.5% IN NEBU: 2.5000 mg | INHALATION_SOLUTION | RESPIRATORY_TRACT | Status: DC | PRN

## 2012-01-21 MED ORDER — BENZONATATE 100 MG PO CAPS
100.0000 mg | ORAL_CAPSULE | Freq: Three times a day (TID) | ORAL | Status: DC | PRN
  Administered 2012-01-21: 100 mg via ORAL
  Filled 2012-01-21: qty 1

## 2012-01-21 MED ORDER — MEGESTROL ACETATE 400 MG/10ML PO SUSP
200.0000 mg | Freq: Two times a day (BID) | ORAL | Status: DC
  Administered 2012-01-21 – 2012-01-23 (×6): 200 mg via ORAL
  Filled 2012-01-21 (×7): qty 5

## 2012-01-21 MED ORDER — SENNOSIDES-DOCUSATE SODIUM 8.6-50 MG PO TABS
1.0000 | ORAL_TABLET | Freq: Every evening | ORAL | Status: DC | PRN
  Filled 2012-01-21: qty 1

## 2012-01-21 MED ORDER — HYDROCODONE-ACETAMINOPHEN 5-325 MG PO TABS
1.0000 | ORAL_TABLET | ORAL | Status: DC | PRN
  Administered 2012-01-21 – 2012-01-23 (×10): 2 via ORAL
  Filled 2012-01-21 (×10): qty 2

## 2012-01-21 MED ORDER — MORPHINE SULFATE 4 MG/ML IJ SOLN
4.0000 mg | Freq: Once | INTRAMUSCULAR | Status: AC
  Administered 2012-01-21: 4 mg via INTRAVENOUS
  Filled 2012-01-21: qty 1

## 2012-01-21 MED ORDER — ZOLPIDEM TARTRATE 5 MG PO TABS: 5.0000 mg | ORAL_TABLET | Freq: Every evening | ORAL | Status: DC | PRN

## 2012-01-21 MED ORDER — METHYLPREDNISOLONE SODIUM SUCC 40 MG IJ SOLR
40.0000 mg | Freq: Two times a day (BID) | INTRAMUSCULAR | Status: DC
  Administered 2012-01-21: 40 mg via INTRAVENOUS
  Filled 2012-01-21 (×3): qty 1

## 2012-01-21 MED ORDER — AZITHROMYCIN 500 MG IV SOLR: 500.0000 mg | INTRAVENOUS | Status: DC

## 2012-01-21 MED ORDER — ENSURE COMPLETE PO LIQD
237.0000 mL | Freq: Two times a day (BID) | ORAL | Status: DC
  Administered 2012-01-22 – 2012-01-23 (×3): 237 mL via ORAL

## 2012-01-21 MED ORDER — LOPERAMIDE HCL 2 MG PO CAPS: 2.0000 mg | ORAL_CAPSULE | Freq: Four times a day (QID) | ORAL | Status: DC | PRN

## 2012-01-21 MED ORDER — CEFTRIAXONE SODIUM 1 G IJ SOLR
1.0000 g | INTRAMUSCULAR | Status: DC
  Administered 2012-01-21: 1 g via INTRAVENOUS
  Filled 2012-01-21: qty 10

## 2012-01-21 MED ORDER — SODIUM CHLORIDE 0.9 % IJ SOLN
3.0000 mL | Freq: Two times a day (BID) | INTRAMUSCULAR | Status: DC
  Administered 2012-01-21 – 2012-01-23 (×5): 3 mL via INTRAVENOUS

## 2012-01-21 MED ORDER — ALBUTEROL SULFATE (5 MG/ML) 0.5% IN NEBU
2.5000 mg | INHALATION_SOLUTION | Freq: Four times a day (QID) | RESPIRATORY_TRACT | Status: DC
  Administered 2012-01-21 – 2012-01-23 (×9): 2.5 mg via RESPIRATORY_TRACT
  Filled 2012-01-21 (×10): qty 0.5

## 2012-01-21 MED ORDER — ENOXAPARIN SODIUM 40 MG/0.4ML ~~LOC~~ SOLN
40.0000 mg | SUBCUTANEOUS | Status: DC
  Administered 2012-01-21 – 2012-01-22 (×2): 40 mg via SUBCUTANEOUS
  Filled 2012-01-21 (×3): qty 0.4

## 2012-01-21 MED ORDER — SODIUM CHLORIDE 0.9 % IJ SOLN
3.0000 mL | Freq: Two times a day (BID) | INTRAMUSCULAR | Status: DC
  Administered 2012-01-21: 3 mL via INTRAVENOUS

## 2012-01-21 MED ORDER — METHYLPREDNISOLONE SODIUM SUCC 125 MG IJ SOLR
60.0000 mg | Freq: Four times a day (QID) | INTRAMUSCULAR | Status: DC
  Administered 2012-01-21 – 2012-01-22 (×4): 60 mg via INTRAVENOUS
  Filled 2012-01-21 (×4): qty 0.96
  Filled 2012-01-21: qty 4
  Filled 2012-01-21: qty 0.96

## 2012-01-21 MED ORDER — SODIUM CHLORIDE 0.9 % IV SOLN: 250.0000 mL | INTRAVENOUS | Status: DC | PRN

## 2012-01-21 MED ORDER — DEXTROSE 5 % IV SOLN
500.0000 mg | INTRAVENOUS | Status: DC
  Administered 2012-01-21 – 2012-01-22 (×2): 500 mg via INTRAVENOUS
  Filled 2012-01-21 (×2): qty 500

## 2012-01-21 MED ORDER — ACETAMINOPHEN 650 MG RE SUPP: 650.0000 mg | Freq: Four times a day (QID) | RECTAL | Status: DC | PRN

## 2012-01-21 MED ORDER — IPRATROPIUM BROMIDE 0.02 % IN SOLN: 0.5000 mg | RESPIRATORY_TRACT | Status: DC | PRN

## 2012-01-21 NOTE — ED Notes (Signed)
Blood cultures ordered by NPafter iv antibiotics administered .

## 2012-01-21 NOTE — Progress Notes (Signed)
ANTIBIOTIC CONSULT NOTE - INITIAL  Pharmacy Consult for vancomycin Indication: pneumonia  Allergies  Allergen Reactions  . Codeine Hives and Nausea And Vomiting    Patient Measurements: Height: 5\' 7"  (170.2 cm) Weight: 103 lb 13.4 oz (47.1 kg) IBW/kg (Calculated) : 66.1   Vital Signs: Temp: 97.6 F (36.4 C) (09/14 0251) Temp src: Oral (09/14 0251) BP: 104/73 mmHg (09/14 0251) Pulse Rate: 95  (09/14 0251)  Labs:  Basename 01/20/12 2006  WBC 15.6*  HGB 16.4  PLT 393  LABCREA --  CREATININE 0.87   Estimated Creatinine Clearance: 64.7 ml/min (by C-G formula based on Cr of 0.87).  Microbiology: No results found for this or any previous visit (from the past 720 hour(s)).  Medical History: Past Medical History  Diagnosis Date  . Thyroid disease     hyper, not on meds  . COPD (chronic obstructive pulmonary disease)   . Hodgkin's disease 1993    with abdominal surgical scar -pt unable to describe   . Spontaneous pneumothorax     left  . Spontaneous pneumothorax     right  . Pneumonia   . Chronic respiratory failure     Medications:  Prescriptions prior to admission  Medication Sig Dispense Refill  . albuterol (PROVENTIL HFA;VENTOLIN HFA) 108 (90 BASE) MCG/ACT inhaler Inhale 2 puffs into the lungs every 6 (six) hours as needed. wheezing      . budesonide-formoterol (SYMBICORT) 160-4.5 MCG/ACT inhaler Inhale 2 puffs into the lungs 2 (two) times daily.      [name] loperamide (IMODIUM) 2 MG capsule Take 2 mg by mouth 4 (four) times daily as needed. For diarrhea      . tiotropium (SPIRIVA) 18 MCG inhalation capsule Place 1 capsule (18 mcg total) into inhaler and inhale daily.  30 capsule  1   Scheduled:    . azithromycin  500 mg Intravenous Q24H  . cefTRIAXone (ROCEPHIN)  IV  1 g Intravenous Once  . cefTRIAXone (ROCEPHIN)  IV  1 g Intravenous Q24H  . enoxaparin (LOVENOX) injection  40 mg Subcutaneous Q24H  . feeding supplement  1 Container Oral TID BM  . megestrol  200  mg Oral BID  . methylPREDNISolone (SOLU-MEDROL) injection  40 mg Intravenous Q12H  .  morphine injection  4 mg Intravenous Once  .  morphine injection  4 mg Intravenous Once  . sodium chloride  3 mL Intravenous Q12H  . sodium chloride  3 mL Intravenous Q12H  . vancomycin  500 mg Intravenous Q12H  . vancomycin  1,000 mg Intravenous Once  . DISCONTD: azithromycin  500 mg Intravenous Q24H  . DISCONTD: piperacillin-tazobactam (ZOSYN)  IV  3.375 g Intravenous Once    Assessment: 56yo male with COPD, chronic respiratory failure, multiple spontaneous pneumothorax s/p pleurodesis, and tobacco abuse c/o CP, SOB, and wheezing, CXR shows acute PNA in the lingula superimposed upon severe COPD/emphysema, to begin IV ABX.  Goal of Therapy:  Vancomycin trough level 15-20 mcg/ml  Plan:  Rec'd vanc 1g in ED; will continue with vancomycin 500mg  IV Q12H and monitor CBC, Cx, levels prn.  Colleen Can PharmD BCPS 01/21/2012,3:06 AM

## 2012-01-21 NOTE — ED Provider Notes (Signed)
History     CSN: [id number]  Arrival date & time 01/20/12  8413   First MD Initiated Contact with Patient 01/20/12 2141      Chief Complaint  Patient presents with  . Chest Pain    (Consider location/radiation/quality/duration/timing/severity/associated sxs/prior treatment) Patient is a 56 y.o. male presenting with chest pain. The history is provided by the patient. No language interpreter was used.  Chest Pain The chest pain began more than 2 weeks ago. Chest pain occurs constantly. The chest pain is worsening. The pain is associated with coughing. At its most intense, the pain is at 7/10. The pain is currently at 7/10. The severity of the pain is moderate. The quality of the pain is described as aching and pressure-like. The pain does not radiate. Primary symptoms include fatigue, shortness of breath and wheezing. Pertinent negatives for primary symptoms include no fever, no palpitations, no abdominal pain, no nausea, no vomiting and no dizziness.  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 States his chest pain is in the mid sterna.    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 States his chest pain is in the mid sternal to left chest. States the pain has been constant for the last 3 days. States that he ran out of his narcotic pain medication 3 days ago. Patient was recently admitted to the hospital for chest pain on 8/17 by the hospitalist. Chest pain was ruled out. Patient states that he has a appointment on the 23rd September the pulmonologist at [facility] Dr. Troy Sine. Room air sats are 94%. States that he feels weak all over.   Past Medical History  Diagnosis Date  . Thyroid disease     hyper, not on meds  . COPD (chronic obstructive pulmonary disease)   . Hodgkin's disease 1993    with abdominal  surgical scar -pt unable to describe   . Spontaneous pneumothorax     left  . Spontaneous pneumothorax     right  . Pneumonia     Past Surgical History  Procedure Date  . Splenectomy   . Chest tube insertion     left  . Chest tube insertion     right    History reviewed. No pertinent family history.  History  Substance Use Topics  . Smoking status: Current Every Day Smoker -- 0.2 packs/day for 41 years    Types: Cigarettes  . Smokeless tobacco: Not on file  . Alcohol Use: 3.6 oz/week    6 Cans of beer per week      Review of Systems  Constitutional: Positive for fatigue. Negative for fever.  HENT: Negative.   Eyes: Negative.   Respiratory: Positive for shortness of breath and wheezing.   Cardiovascular: Positive for chest pain. Negative for palpitations and leg swelling.  Gastrointestinal: Negative.  Negative for nausea, vomiting and abdominal pain.  Musculoskeletal: Negative for back pain and gait problem.  Neurological: Negative.  Negative for dizziness. 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 States his chest pain is in the mid sterna.  Psychiatric/Behavioral: Negative.   All other systems reviewed and are negative.    Allergies  Codeine  Home Medications   Current Outpatient Rx  Name Route Sig Dispense Refill  . ALBUTEROL SULFATE HFA 108 (90 BASE)  MCG/ACT IN AERS Inhalation Inhale 2 puffs into the lungs every 6 (six) hours as needed. wheezing    . BUDESONIDE-FORMOTEROL FUMARATE 160-4.5 MCG/ACT IN AERO Inhalation Inhale 2 puffs into the lungs 2 (two) times daily.    [name] LOPERAMIDE HCL 2 MG PO CAPS Oral Take 2 mg by mouth 4 (four) times daily as needed. For diarrhea    . TIOTROPIUM BROMIDE MONOHYDRATE 18 MCG IN CAPS Inhalation Place 1 capsule (18 mcg total) into inhaler and inhale daily. 30 capsule 1    BP 102/70  Pulse 105  Temp 98.3 F (36.8 C) (Oral)  Resp 19   SpO2 94%  Physical Exam  Nursing note and vitals reviewed. Constitutional: He is oriented to person, place, and time. He appears well-developed and well-nourished.  HENT:  Head: Normocephalic.  Eyes: Conjunctivae normal and EOM are normal. Pupils are equal, round, and reactive to light.  Neck: Normal range of motion. Neck supple.  Cardiovascular: Normal rate.   Pulmonary/Chest: He is in respiratory distress. He has no wheezes. He has no rales.  Abdominal: Soft.  Musculoskeletal: Normal range of motion. He exhibits no edema and no tenderness.  Neurological: He is alert and oriented to person, place, and time.  Skin: Skin is warm and dry.  Psychiatric: He has a normal mood and affect.    ED Course  Procedures (including critical care time)  Labs Reviewed  CBC - Abnormal; Notable for the following:    WBC 15.6 (*)     All other components within normal limits  BASIC METABOLIC PANEL - Abnormal; Notable for the following:    Calcium 11.1 (*)     All other components within normal limits  POCT I-STAT TROPONIN I   Dg Chest 2 View  01/20/2012  *RADIOLOGY REPORT*  Clinical Data: Chest pain.  Smoker with history of COPD.  CHEST - 2 VIEW  Comparison: Two-view chest x-ray 01/09/2012, 12/24/2011, 12/18/2011, 09/26/2011.  Findings: Hyperinflation with severe bullous emphysematous changes throughout both lungs.  Right apical and right basilar pleuroparenchymal scarring.  New patchy airspace opacities in the lingula since the examination 11 days ago.  No new pulmonary parenchymal abnormalities elsewhere.  No pleural effusions. Cardiac silhouette normal in size, unchanged.  Thoracic aorta mildly atherosclerotic.  Enlarged central pulmonary arteries, unchanged.  Degenerative changes involving the thoracic spine.  IMPRESSION: Acute pneumonia in the lingula superimposed upon severe COPD/emphysema.  Right apical and right basilar pleuroparenchymal scarring.   Original Report Authenticated By: Arnell Sieving, M.D.      No diagnosis found.    MDM  56yo male with increasing SOB x 3 days with CP.  Out of pain meds.  Chest x-ray shows LUL pneumonia.  Patient is tachycardic.  Wbc 15.6.  Patient will be admitted to hospitalists.  Trop - x 1.     Labs Reviewed  CBC - Abnormal; Notable for the following:    WBC 15.6 (*)     All other components within normal limits  BASIC METABOLIC PANEL - Abnormal; Notable for the following:    Calcium 11.1 (*)     All other components within normal limits  POCT I-STAT TROPONIN I  CULTURE, BLOOD (ROUTINE X 2)  CULTURE, BLOOD (ROUTINE X 2)          Remi Haggard, NP 01/21/12 [phone]

## 2012-01-21 NOTE — Progress Notes (Signed)
INITIAL ADULT NUTRITION ASSESSMENT Date: 01/21/2012   Time: 4:30 PM Reason for Assessment: MST  INTERVENTION: 1.  Supplements; Ensure Complete BID between meals 2.  Meals/snacks; double dessert portions 3.  Nutrition-related medication; question whether inability to gain wt may be related to hyperthyroidism- is pt appropriate for medication management?   DOCUMENTATION CODES Per approved criteria  -Severe malnutrition in the context of chronic illness    ASSESSMENT: Male 56 y.o.  Dx: chest pain, shortness of breath  Hx:  Past Medical History  Diagnosis Date  . Thyroid disease     hyper, not on meds  . COPD (chronic obstructive pulmonary disease)   . Hodgkin's disease 1993    with abdominal surgical scar -pt unable to describe   . Spontaneous pneumothorax     left  . Spontaneous pneumothorax     right  . Pneumonia   . Chronic respiratory failure    Past Surgical History  Procedure Date  . Splenectomy   . Chest tube insertion     left  . Chest tube insertion     right    Related Meds:  Scheduled Meds:   . albuterol  2.5 mg Nebulization Q6H  . azithromycin  500 mg Intravenous Q24H  . cefTRIAXone (ROCEPHIN)  IV  1 g Intravenous Once  . cefTRIAXone (ROCEPHIN)  IV  1 g Intravenous Q24H  . enoxaparin (LOVENOX) injection  40 mg Subcutaneous Q24H  . feeding supplement  1 Container Oral TID BM  . ipratropium  0.5 mg Nebulization Q6H  . megestrol  200 mg Oral BID  . methylPREDNISolone (SOLU-MEDROL) injection  60 mg Intravenous Q6H  .  morphine injection  4 mg Intravenous Once  .  morphine injection  4 mg Intravenous Once  . sodium chloride  3 mL Intravenous Q12H  . sodium chloride  3 mL Intravenous Q12H  . vancomycin  500 mg Intravenous Q12H  . vancomycin  1,000 mg Intravenous Once  . DISCONTD: azithromycin  500 mg Intravenous Q24H  . DISCONTD: methylPREDNISolone (SOLU-MEDROL) injection  40 mg Intravenous Q12H  . DISCONTD: piperacillin-tazobactam (ZOSYN)  IV  3.375  g Intravenous Once   Continuous Infusions:  PRN Meds:.sodium chloride, acetaminophen, acetaminophen, albuterol, alum & mag hydroxide-simeth, benzonatate, HYDROcodone-acetaminophen, ipratropium, loperamide, ondansetron (ZOFRAN) IV, ondansetron, senna-docusate, sodium chloride, zolpidem   Ht: 5\' 7"  (170.2 cm)  Wt: 103 lb 13.4 oz (47.1 kg)  Ideal Wt: 47.1 kg % Ideal Wt: 70%  Usual Wt: 120 lbs % Usual Wt: 85%  Body mass index is 16.26 kg/(m^2).  Food/Nutrition Related Hx: good appetite, poor intake PTA r/t pain  Labs:  CMP     Component Value Date/Time   NA 139 01/21/2012 0348   K 3.9 01/21/2012 0348   CL 98 01/21/2012 0348   CO2 32 01/21/2012 0348   GLUCOSE 129* 01/21/2012 0348   BUN 13 01/21/2012 0348   CREATININE 0.71 01/21/2012 0348   CALCIUM 9.6 01/21/2012 0348   PROT 7.1 01/09/2012 1630   ALBUMIN 3.1* 01/09/2012 1630   AST 24 01/09/2012 1630   ALT 21 01/09/2012 1630   ALKPHOS 101 01/09/2012 1630   BILITOT 0.2* 01/09/2012 1630   GFRNONAA >90 01/21/2012 0348   GFRAA >90 01/21/2012 0348    CBC    Component Value Date/Time   WBC 12.1* 01/21/2012 0348   RBC 4.80 01/21/2012 0348   HGB 14.2 01/21/2012 0348   HCT 42.1 01/21/2012 0348   PLT 355 01/21/2012 0348   MCV 87.7 01/21/2012 0348  MCH 29.6 01/21/2012 0348   MCHC 33.7 01/21/2012 0348   RDW 14.6 01/21/2012 0348   LYMPHSABS 2.9 01/09/2012 1630   MONOABS 1.4* 01/09/2012 1630   EOSABS 0.2 01/09/2012 1630   BASOSABS 0.0 01/09/2012 1630   Intake: 100% Output:   Intake/Output Summary (Last 24 hours) at 01/21/12 1632 Last data filed at 01/21/12 1300  Gross per 24 hour  Intake    840 ml  Output    500 ml  Net    340 ml   Last BM (9/13)  Diet Order: Regular  Supplements/Tube Feeding: none at this time  IVF:    Estimated Nutritional Needs:   Kcal: 1880-2120 kcal Protein: 85-94g Fluid: >1.5 L/day  Pt reports he has a good appetite, however sometime struggles with intake due to pain management.  Pt reports he has been eating well since  admission to due  Better pain control.  He feels as though if we were to an appetite stimulant he could take he would eat better.  Encouraged pt to discuss with medical team.  Pt request double dessert portions which can be arranged.  Pt would like to receive supplements as inpatient.  Pt denies limited access to food. Note pt hyperthyroidism, but no medications.  Question whether medication management would improve ability to regain lost wt. Discussed using protein supplements at home.  Pt qualifies for severe malnutrition of chronic illness due to 14% wt loss in 3 months, pt with wasting at clavicles and temples, and pt meeting <75% of estimated needs on average due to inability to eat through pain.  Pt does not report any EtOH use to this RD at time of visit.   NUTRITION DIAGNOSIS: Unintended wt loss  RELATED TO: pain  AS EVIDENCE BY: pt with good appetite, unable to eat due to pain  MONITORING/EVALUATION(Goals): 1.  Food/Beverage; pt continues to consume 100% of meals and supplements daily 2.  Wt/wt change; promote wt gain  EDUCATION NEEDS: -Education needs addressed with pt    Loyce Dys, MS RD LDN Clinical Inpatient Dietitian Pager: [phone] Weekend/After hours pager: [phone]

## 2012-01-21 NOTE — Progress Notes (Signed)
Pt seen and examined 56/M w/ Severe COPD/PTX/Pleurodesis Here w/ pneumonia/COPD exacerbation Continue abx, increase steroids, add nebs Ambulate Flutter valve  Zannie Cove, MD [phone]

## 2012-01-21 NOTE — ED Provider Notes (Signed)
Medical screening examination/treatment/procedure(s) were performed by non-physician practitioner and as supervising physician I was immediately available for consultation/collaboration.   Carleene Cooper III, MD 01/21/12 [phone]

## 2012-01-21 NOTE — H&P (Signed)
PCP:   None   Chief Complaint:  Chest pain and shortness of breath  HPI: This is a 56 year old gentleman with history of the COPD, chronic respiratory failure, multiple spontaneous pneumothorax status post pleurodesis and ongoing tobacco abuse. Since his pleurodesis in May patient states he's continued to have chest pains, he has just not felt right. He comes to the ER today with complaints of chest pains centrally located, shortness of breath and wheezing. He states he has a cough that is productive of whitish phlegm, he has chills. He's had diarrhea this morning. He denies any fevers, nausea, vomiting. He does have pain with deep breathing. Reports a poor appetite and 20 pound weight loss since May. History provided by the patient.  Review of Systems:  The patient denies anorexia, fever, vision loss, decreased hearing, hoarseness, syncope, dyspnea on exertion, peripheral edema, balance deficits, hemoptysis, abdominal pain, melena, hematochezia, severe indigestion/heartburn, hematuria, incontinence, genital sores, muscle weakness, suspicious skin lesions, transient blindness, difficulty walking, depression, abnormal bleeding, enlarged lymph nodes, angioedema, and breast masses.  Past Medical History: Past Medical History  Diagnosis Date  . Thyroid disease     hyper, not on meds  . COPD (chronic obstructive pulmonary disease)   . Hodgkin's disease 1993    with abdominal surgical scar -pt unable to describe   . Spontaneous pneumothorax     left  . Spontaneous pneumothorax     right  . Pneumonia   . Chronic respiratory failure    Past Surgical History  Procedure Date  . Splenectomy   . Chest tube insertion     left  . Chest tube insertion     right    Medications: Prior to Admission medications   Medication Sig Start Date End Date Taking? Authorizing Provider  albuterol (PROVENTIL HFA;VENTOLIN HFA) 108 (90 BASE) MCG/ACT inhaler Inhale 2 puffs into the lungs every 6 (six) hours as  needed. wheezing   Yes Historical Provider, MD  budesonide-formoterol (SYMBICORT) 160-4.5 MCG/ACT inhaler Inhale 2 puffs into the lungs 2 (two) times daily.   Yes Historical Provider, MD  loperamide (IMODIUM) 2 MG capsule Take 2 mg by mouth 4 (four) times daily as needed. For diarrhea   Yes Historical Provider, MD  tiotropium (SPIRIVA) 18 MCG inhalation capsule Place 1 capsule (18 mcg total) into inhaler and inhale daily. 12/26/11 12/25/12 Yes Estela Isaiah Blakes, MD    Allergies:   Allergies  Allergen Reactions  . Codeine Hives and Nausea And Vomiting    Social History:  reports that he has been smoking Cigarettes.  He has a 10.25 pack-year smoking history. He does not have any smokeless tobacco history on file. He reports that he drinks about 3.6 ounces of alcohol per week. He reports that he does not use illicit drugs.  Family History: History reviewed. No pertinent family history.  Physical Exam: Filed Vitals:   01/20/12 2002 01/20/12 2230  BP: 110/87 102/70  Pulse: 115 105  Temp: 98.3 F (36.8 C)   TempSrc: Oral   Resp: 20 19  SpO2: 93% 94%    General:  Alert and oriented times three, cachectic, no acute distress Eyes: PERRLA, pink conjunctiva, no scleral icterus ENT: Moist oral mucosa, neck supple, no thyromegaly Lungs: clear to ascultation, no wheeze, no crackles, no use of accessory muscles Cardiovascular: regular rate and rhythm, no regurgitation, no gallops, no murmurs. No carotid bruits, no JVD Abdomen: soft, positive BS, non-tender, non-distended, no organomegaly, not an acute abdomen GU: not examined Neuro: CN  II - XII grossly intact, sensation intact Musculoskeletal: strength 5/5 all extremities, no clubbing, cyanosis or edema Skin: no rash, no subcutaneous crepitation, no decubitus Psych: appropriate patient   Labs on Admission:   [facility] 01/20/12 2006  NA 141  K 4.4  CL 99  CO2 31  GLUCOSE 94  BUN 15  CREATININE 0.87  CALCIUM 11.1*  MG --    PHOS --   No results found for this basename: AST:2,ALT:2,ALKPHOS:2,BILITOT:2,PROT:2,ALBUMIN:2 in the last 72 hours No results found for this basename: LIPASE:2,AMYLASE:2 in the last 72 hours  Basename 01/20/12 2006  WBC 15.6*  NEUTROABS --  HGB 16.4  HCT 48.1  MCV 88.4  PLT 393   No results found for this basename: CKTOTAL:3,CKMB:3,CKMBINDEX:3,TROPONINI:3 in the last 72 hours No components found with this basename: POCBNP:3 No results found for this basename: DDIMER:2 in the last 72 hours No results found for this basename: HGBA1C:2 in the last 72 hours No results found for this basename: CHOL:2,HDL:2,LDLCALC:2,TRIG:2,CHOLHDL:2,LDLDIRECT:2 in the last 72 hours No results found for this basename: TSH,T4TOTAL,FREET3,T3FREE,THYROIDAB in the last 72 hours No results found for this basename: VITAMINB12:2,FOLATE:2,FERRITIN:2,TIBC:2,IRON:2,RETICCTPCT:2 in the last 72 hours  Micro Results: No results found for this or any previous visit (from the past 240 hour(s)).   Radiological Exams on Admission: Dg Chest 2 View  01/20/2012  *RADIOLOGY REPORT*  Clinical Data: Chest pain.  Smoker with history of COPD.  CHEST - 2 VIEW  Comparison: Two-view chest x-ray 01/09/2012, 12/24/2011, 12/18/2011, 09/26/2011.  Findings: Hyperinflation with severe bullous emphysematous changes throughout both lungs.  Right apical and right basilar pleuroparenchymal scarring.  New patchy airspace opacities in the lingula since the examination 11 days ago.  No new pulmonary parenchymal abnormalities elsewhere.  No pleural effusions. Cardiac silhouette normal in size, unchanged.  Thoracic aorta mildly atherosclerotic.  Enlarged central pulmonary arteries, unchanged.  Degenerative changes involving the thoracic spine.  IMPRESSION: Acute pneumonia in the lingula superimposed upon severe COPD/emphysema.  Right apical and right basilar pleuroparenchymal scarring.   Original Report Authenticated By: Arnell Sieving, M.D.      Assessment/Plan Present on Admission:  . facility acquired pneumonia Admit to telemetry Antibiotics started, Vanco, Rocephin, azithromycin. Blood cultures ordered, oxygen, duo nebs, Tessalon Perles   .COPD (chronic obstructive pulmonary disease) Chronic respiratory failure/history of spontaneous pneumothorax status post pleurodesis .Tobacco abuse Nicotine patch  Severe protein calorie deficient malnutrition Prealbumin ordered Ensure ordered Megace also ordered  Weight loss Patient states her appetite is stated Megace ordered Tumor markers ordered . history of ETOH abuse Patient states he has not drank since May History of Hodgkin's disease History of thyroid disease Stable  Full code  DVT prophylaxis   July Nickson 01/21/2012, 1:32 AM

## 2012-01-21 NOTE — ED Notes (Signed)
Report given to 4700 unit nurse , transported in stable condition , denies chest pain , respirations unlabored , oxygen 2 lpm /[place].

## 2012-01-22 DIAGNOSIS — J449 Chronic obstructive pulmonary disease, unspecified: Secondary | ICD-10-CM

## 2012-01-22 DIAGNOSIS — J189 Pneumonia, unspecified organism: Secondary | ICD-10-CM

## 2012-01-22 LAB — CBC
HCT: 42.2 % (ref 39.0–52.0)
Hemoglobin: 13.9 g/dL (ref 13.0–17.0)
MCHC: 32.9 g/dL (ref 30.0–36.0)
WBC: 15.8 10*3/uL — ABNORMAL HIGH (ref 4.0–10.5)

## 2012-01-22 LAB — BASIC METABOLIC PANEL
BUN: 15 mg/dL (ref 6–23)
Chloride: 100 mEq/L (ref 96–112)
Glucose, Bld: 114 mg/dL — ABNORMAL HIGH (ref 70–99)
Potassium: 4.8 mEq/L (ref 3.5–5.1)

## 2012-01-22 MED ORDER — MOXIFLOXACIN HCL 400 MG PO TABS
400.0000 mg | ORAL_TABLET | Freq: Every day | ORAL | Status: DC
  Administered 2012-01-22 (×2): 400 mg via ORAL
  Filled 2012-01-22 (×3): qty 1

## 2012-01-22 MED ORDER — PREDNISONE 50 MG PO TABS
50.0000 mg | ORAL_TABLET | Freq: Every day | ORAL | Status: DC
  Administered 2012-01-22 – 2012-01-23 (×2): 50 mg via ORAL
  Filled 2012-01-22 (×3): qty 1

## 2012-01-22 NOTE — ED Provider Notes (Signed)
Medical screening examination/treatment/procedure(s) were performed by non-physician practitioner and as supervising physician I was immediately available for consultation/collaboration.  Derwood Kaplan, MD 01/22/12 0041

## 2012-01-22 NOTE — Progress Notes (Signed)
[facility]             Progress Note   Subjective: Doing much better, breathing back to baseline  Objective: Vital signs in last 24 hours: Temp:  [97.6 F (36.4 C)-98.2 F (36.8 C)] 98.1 F (36.7 C) (09/15 0620) Pulse Rate:  [77-94] 77  (09/15 0620) Resp:  [18-20] 18  (09/15 0620) BP: (95-123)/(56-67) 104/56 mmHg (09/15 0620) SpO2:  [88 %-96 %] 94 % (09/15 0620) Weight:  [49.8 kg (109 lb 12.6 oz)] 49.8 kg (109 lb 12.6 oz) (09/15 0620) Weight change: 2.7 kg (5 lb 15.2 oz) Last BM Date: 01/21/12  Intake/Output from previous day: 09/14 0701 - 09/15 0700 In: 2190 [P.O.:1440; IV Piggyback:750] Out: 1350 [Urine:1350]     Physical Exam: General: Alert, awake, oriented x3, in no acute distress. HEENT: No bruits, no goiter. Heart: Regular rate and rhythm, without murmurs, rubs, gallops. Lungs: improved air movement bilaterally, scattered ronchi Abdomen: Soft, nontender, nondistended, positive bowel sounds. Extremities: No clubbing cyanosis or edema with positive pedal pulses. Neuro: Grossly intact, nonfocal.    Lab Results: Basic Metabolic Panel:  Basename 01/22/12 0525 01/21/12 0348  NA 137 139  K 4.8 3.9  CL 100 98  CO2 30 32  GLUCOSE 114* 129*  BUN 15 13  CREATININE 0.67 0.71  CALCIUM 9.7 9.6  MG -- --  PHOS -- --   Liver Function Tests: No results found for this basename: AST:2,ALT:2,ALKPHOS:2,BILITOT:2,PROT:2,ALBUMIN:2 in the last 72 hours No results found for this basename: LIPASE:2,AMYLASE:2 in the last 72 hours No results found for this basename: AMMONIA:2 in the last 72 hours CBC:  Basename 01/22/12 0525 01/21/12 0348  WBC 15.8* 12.1*  NEUTROABS -- --  HGB 13.9 14.2  HCT 42.2 42.1  MCV 88.1 87.7  PLT 381 355   Cardiac Enzymes: No results found for this basename: CKTOTAL:3,CKMB:3,CKMBINDEX:3,TROPONINI:3 in the last 72 hours BNP: No results found for this basename: PROBNP:3 in the last 72 hours D-Dimer: No results found for this  basename: DDIMER:2 in the last 72 hours CBG: No results found for this basename: GLUCAP:6 in the last 72 hours Hemoglobin A1C: No results found for this basename: HGBA1C in the last 72 hours Fasting Lipid Panel: No results found for this basename: CHOL,HDL,LDLCALC,TRIG,CHOLHDL,LDLDIRECT in the last 72 hours Thyroid Function Tests: No results found for this basename: TSH,T4TOTAL,FREET4,T3FREE,THYROIDAB in the last 72 hours Anemia Panel: No results found for this basename: VITAMINB12,FOLATE,FERRITIN,TIBC,IRON,RETICCTPCT in the last 72 hours Coagulation: No results found for this basename: LABPROT:2,INR:2 in the last 72 hours Urine Drug Screen: Drugs of Abuse  No results found for this basename: labopia, cocainscrnur, labbenz, amphetmu, thcu, labbarb    Alcohol Level: No results found for this basename: ETH:2 in the last 72 hours Urinalysis: No results found for this basename: COLORURINE:2,APPERANCEUR:2,LABSPEC:2,PHURINE:2,GLUCOSEU:2,HGBUR:2,BILIRUBINUR:2,KETONESUR:2,PROTEINUR:2,UROBILINOGEN:2,NITRITE:2,LEUKOCYTESUR:2 in the last 72 hours  No results found for this or any previous visit (from the past 240 hour(s)).  Studies/Results: Dg Chest 2 View  01/20/2012  *RADIOLOGY REPORT*  Clinical Data: Chest pain.  Smoker with history of COPD.  CHEST - 2 VIEW  Comparison: Two-view chest x-ray 01/09/2012, 12/24/2011, 12/18/2011, 09/26/2011.  Findings: Hyperinflation with severe bullous emphysematous changes throughout both lungs.  Right apical and right basilar pleuroparenchymal scarring.  New patchy airspace opacities in the lingula since the examination 11 days ago.  No new pulmonary parenchymal abnormalities elsewhere.  No pleural effusions. Cardiac silhouette normal in size, unchanged.  Thoracic aorta mildly atherosclerotic.  Enlarged central pulmonary arteries, unchanged.  Degenerative changes involving  the thoracic spine.  IMPRESSION: Acute pneumonia in the lingula superimposed upon severe  COPD/emphysema.  Right apical and right basilar pleuroparenchymal scarring.   Original Report Authenticated By: Arnell Sieving, M.D.     Medications: Scheduled Meds:   . albuterol  2.5 mg Nebulization Q6H  . enoxaparin (LOVENOX) injection  40 mg Subcutaneous Q24H  . feeding supplement  237 mL Oral BID BM  . feeding supplement  1 Container Oral TID BM  . ipratropium  0.5 mg Nebulization Q6H  . megestrol  200 mg Oral BID  . moxifloxacin  400 mg Oral Q2000  . predniSONE  50 mg Oral Q breakfast  . sodium chloride  3 mL Intravenous Q12H  . DISCONTD: azithromycin  500 mg Intravenous Q24H  . DISCONTD: cefTRIAXone (ROCEPHIN)  IV  1 g Intravenous Q24H  . DISCONTD: methylPREDNISolone (SOLU-MEDROL) injection  60 mg Intravenous Q6H  . DISCONTD: methylPREDNISolone (SOLU-MEDROL) injection  40 mg Intravenous Q12H  . DISCONTD: sodium chloride  3 mL Intravenous Q12H  . DISCONTD: vancomycin  500 mg Intravenous Q12H   Continuous Infusions:  PRN Meds:.acetaminophen, acetaminophen, albuterol, alum & mag hydroxide-simeth, benzonatate, HYDROcodone-acetaminophen, ipratropium, loperamide, ondansetron (ZOFRAN) IV, ondansetron, senna-docusate, zolpidem, DISCONTD: sodium chloride, DISCONTD: sodium chloride  Assessment/Plan: 1. Health care associated pneumonia: Improving Blood Cx negative Transition to Oral Avelox today  2. COPD -severe with acute exacerbation Transition to Oral prednisone today Continue nebs  Abx as above Smoking cessation  3. Severe PCM: continue ensure, megace  4. H/o Hodgkins disease : Fu with Oncology  5. DVT prophylaxis: lovenox  Dispo: home 9/16, CM to see on Monday for PCP info   LOS: 2 days   [facility] Pager: [phone] 01/22/2012, 7:35 AM

## 2012-01-22 NOTE — Progress Notes (Addendum)
Pt walked off the unit at 0845 and has not returned. Pt has been notified of hospital and unit policies, however refuses to cooperate. Gilman Schmidt MD made aware pt is leaving the unit. Will continue to educate pt of safety concerns when leaving the unit.Philip Coleman

## 2012-01-23 DIAGNOSIS — J189 Pneumonia, unspecified organism: Principal | ICD-10-CM

## 2012-01-23 DIAGNOSIS — E43 Unspecified severe protein-calorie malnutrition: Secondary | ICD-10-CM | POA: Diagnosis present

## 2012-01-23 LAB — CBC
HCT: 41.7 % (ref 39.0–52.0)
Hemoglobin: 13.6 g/dL (ref 13.0–17.0)
MCH: 29.1 pg (ref 26.0–34.0)
MCHC: 32.6 g/dL (ref 30.0–36.0)
MCV: 89.1 fL (ref 78.0–100.0)

## 2012-01-23 LAB — BASIC METABOLIC PANEL
BUN: 24 mg/dL — ABNORMAL HIGH (ref 6–23)
Calcium: 9.8 mg/dL (ref 8.4–10.5)
GFR calc non Af Amer: >90 mL/min (ref >90)
Glucose, Bld: 111 mg/dL — ABNORMAL HIGH (ref 70–99)

## 2012-01-23 MED ORDER — ENSURE COMPLETE PO LIQD: 237.0000 mL | Freq: Two times a day (BID) | ORAL | Status: DC

## 2012-01-23 MED ORDER — HYDROCODONE-ACETAMINOPHEN 5-325 MG PO TABS: 1.0000 | ORAL_TABLET | Freq: Four times a day (QID) | ORAL | Status: AC | PRN

## 2012-01-23 MED ORDER — MEGESTROL ACETATE 20 MG PO TABS: 20.0000 mg | ORAL_TABLET | Freq: Every day | ORAL | Status: AC

## 2012-01-23 MED ORDER — PREDNISONE (PAK) 10 MG PO TABS: 20.0000 mg | ORAL_TABLET | Freq: Every day | ORAL | Status: AC

## 2012-01-23 MED ORDER — MOXIFLOXACIN HCL 400 MG PO TABS: 400.0000 mg | ORAL_TABLET | Freq: Every day | ORAL | Status: AC

## 2012-01-23 NOTE — Care Management Note (Signed)
    Page 1 of 1   01/23/2012     10:44:12 AM   CARE MANAGEMENT NOTE 01/23/2012  Patient:  Philip Coleman, Philip Coleman   Account Number:  [id number]  Date Initiated:  01/23/2012  Documentation initiated by:  CRAFT,TERRI  Subjective/Objective Assessment:   56 yo male admitted 01/20/12 with PNA     Action/Plan:   D/C when medically stable   Anticipated DC Date:  01/23/2012   Anticipated DC Plan:  HOME/SELF CARE      DC Planning Services  CM consult              Status of service:  Completed, signed off  Discharge Disposition:  HOME/SELF CARE  Per UR Regulation:  Reviewed for med. necessity/level of care/duration of stay  Comments:  01/23/12, Kathi Der RNC-MNN, BSN, [phone], CM received referral.  CM met with pt and given PCP information as well as additional resources to schedule appt.  Pt appreciative of information.

## 2012-01-23 NOTE — Progress Notes (Signed)
UR done. 

## 2012-01-23 NOTE — Progress Notes (Signed)
Pt given DC instructions and pt verbalized understanding.  Patent DC home.

## 2012-01-27 LAB — CULTURE, BLOOD (ROUTINE X 2): Culture: NO GROWTH

## 2012-02-01 NOTE — Discharge Summary (Signed)
Physician Discharge Summary  Patient ID: Philip Coleman MRN: [id number] DOB/AGE: 06-06-1955 56 y.o.  Admit date: 01/20/2012 Discharge date: 02/01/2012  Primary Care Physician:  Provider Not In System   Discharge Diagnoses:    Active Problems:  COPD (chronic obstructive pulmonary disease)  ETOH abuse  Tobacco abuse  Community acquired pneumonia  COPD with exacerbation  Protein-calorie malnutrition, severe  h/o Hodgkins disease Hx of multiple spontaneous pneumothorax     Medication List     As of 02/01/2012 10:03 PM    TAKE these medications         albuterol 108 (90 BASE) MCG/ACT inhaler   Commonly known as: PROVENTIL HFA;VENTOLIN HFA   Inhale 2 puffs into the lungs every 6 (six) hours as needed. wheezing      budesonide-formoterol 160-4.5 MCG/ACT inhaler   Commonly known as: SYMBICORT   Inhale 2 puffs into the lungs 2 (two) times daily.      feeding supplement Liqd   Take 237 mLs by mouth 2 (two) times daily between meals.      HYDROcodone-acetaminophen 5-325 MG per tablet   Commonly known as: NORCO/VICODIN   Take 1 tablet by mouth every 6 (six) hours as needed.      loperamide 2 MG capsule   Commonly known as: IMODIUM   Take 2 mg by mouth 4 (four) times daily as needed. For diarrhea      megestrol 20 MG tablet   Commonly known as: MEGACE   Take 1 tablet (20 mg total) by mouth daily.      moxifloxacin 400 MG tablet   Commonly known as: AVELOX   Take 1 tablet (400 mg total) by mouth daily at 8 pm. For 6 more days      predniSONE 10 MG tablet   Commonly known as: STERAPRED UNI-PAK   Take 2 tablets (20 mg total) by mouth daily. Take 2 tabs for 2 days then 1 tab for 2 days then STOP      tiotropium 18 MCG inhalation capsule   Commonly known as: SPIRIVA   Place 1 capsule (18 mcg total) into inhaler and inhale daily.         Disposition and Follow-up:  PCP in 1 week Pulmonary in 2 weeks  Significant Diagnostic Studies:  CXR  IMPRESSION: Acute  pneumonia in the lingula superimposed upon severe COPD/emphysema. Right apical and right basilar pleuroparenchymal scarring.  Brief H and P: This is a 56 year old gentleman with history of the COPD, chronic respiratory failure, multiple spontaneous pneumothorax status post pleurodesis and ongoing tobacco abuse. Since his pleurodesis in May patient states he's continued to have chest pains, he has just not felt right. He comes to the ER today with complaints of chest pains centrally located, shortness of breath and wheezing. He states he has a cough that is productive of whitish phlegm, he has chills. He's had diarrhea this morning. He denies any fevers, nausea, vomiting. He does have pain with deep breathing. Reports a poor appetite and 20 pound weight loss since May. History provided by the patient.    Hospital Course:   1. Health care associated pneumonia: Initially treated with IV Vancomycin and Zosyn Improving Blood Cx negative Transition to Oral Avelox today   2. COPD -severe with acute exacerbation Transitioned to Oral prednisone from IV steroids Continue nebs   Abx as above Smoking cessation   3. Severe PCM: continue ensure, megace   4. H/o Hodgkins disease : Fu with Oncology  Time spent on Discharge:  Signed: Michale Emmerich [facility]  02/01/2012, 10:03 PM

## 2012-03-30 ENCOUNTER — Encounter (HOSPITAL_COMMUNITY): Payer: Self-pay | Admitting: *Deleted

## 2012-03-30 ENCOUNTER — Emergency Department (HOSPITAL_COMMUNITY)
Admission: EM | Admit: 2012-03-30 | Discharge: 2012-03-31 | Disposition: A | Discharge status: Home / Self Care | Payer: Medicare Other | Attending: Emergency Medicine | Admitting: Emergency Medicine

## 2012-03-30 ENCOUNTER — Emergency Department (HOSPITAL_COMMUNITY): Payer: Medicare Other

## 2012-03-30 DIAGNOSIS — IMO0002 Reserved for concepts with insufficient information to code with codable children: Secondary | ICD-10-CM | POA: Insufficient documentation

## 2012-03-30 DIAGNOSIS — R05 Cough: Secondary | ICD-10-CM | POA: Insufficient documentation

## 2012-03-30 DIAGNOSIS — J9610 Chronic respiratory failure, unspecified whether with hypoxia or hypercapnia: Secondary | ICD-10-CM | POA: Insufficient documentation

## 2012-03-30 DIAGNOSIS — Z862 Personal history of diseases of the blood and blood-forming organs and certain disorders involving the immune mechanism: Secondary | ICD-10-CM | POA: Insufficient documentation

## 2012-03-30 DIAGNOSIS — R079 Chest pain, unspecified: Secondary | ICD-10-CM | POA: Insufficient documentation

## 2012-03-30 DIAGNOSIS — C8190 Hodgkin lymphoma, unspecified, unspecified site: Secondary | ICD-10-CM | POA: Insufficient documentation

## 2012-03-30 DIAGNOSIS — Z8639 Personal history of other endocrine, nutritional and metabolic disease: Secondary | ICD-10-CM | POA: Insufficient documentation

## 2012-03-30 DIAGNOSIS — Z8709 Personal history of other diseases of the respiratory system: Secondary | ICD-10-CM | POA: Insufficient documentation

## 2012-03-30 DIAGNOSIS — Z8701 Personal history of pneumonia (recurrent): Secondary | ICD-10-CM | POA: Insufficient documentation

## 2012-03-30 DIAGNOSIS — R059 Cough, unspecified: Secondary | ICD-10-CM | POA: Insufficient documentation

## 2012-03-30 DIAGNOSIS — F17200 Nicotine dependence, unspecified, uncomplicated: Secondary | ICD-10-CM | POA: Insufficient documentation

## 2012-03-30 DIAGNOSIS — J441 Chronic obstructive pulmonary disease with (acute) exacerbation: Secondary | ICD-10-CM | POA: Insufficient documentation

## 2012-03-30 DIAGNOSIS — Z79899 Other long term (current) drug therapy: Secondary | ICD-10-CM | POA: Insufficient documentation

## 2012-03-30 LAB — COMPREHENSIVE METABOLIC PANEL
ALT: 12 U/L (ref 0–53)
AST: 19 U/L (ref 0–37)
Albumin: 3.5 g/dL (ref 3.5–5.2)
Alkaline Phosphatase: 80 U/L (ref 39–117)
Calcium: 9 mg/dL (ref 8.4–10.5)
Potassium: 3.2 mEq/L — ABNORMAL LOW (ref 3.5–5.1)
Sodium: 136 mEq/L (ref 135–145)
Total Protein: 6.7 g/dL (ref 6.0–8.3)

## 2012-03-30 LAB — CBC WITH DIFFERENTIAL/PLATELET
Basophils Absolute: 0 10*3/uL (ref 0.0–0.1)
Eosinophils Absolute: 0 10*3/uL (ref 0.0–0.7)
Eosinophils Relative: 0 % (ref 0–5)
Lymphs Abs: 1 10*3/uL (ref 0.7–4.0)
MCH: 29.6 pg (ref 26.0–34.0)
MCV: 87.7 fL (ref 78.0–100.0)
Neutrophils Relative %: 90 % — ABNORMAL HIGH (ref 43–77)
Platelets: 304 10*3/uL (ref 150–400)
RBC: 4.96 MIL/uL (ref 4.22–5.81)
RDW: 15.8 % — ABNORMAL HIGH (ref 11.5–15.5)
WBC: 18.2 10*3/uL — ABNORMAL HIGH (ref 4.0–10.5)

## 2012-03-30 MED ORDER — IPRATROPIUM BROMIDE 0.02 % IN SOLN
0.5000 mg | Freq: Once | RESPIRATORY_TRACT | Status: AC
  Administered 2012-03-30: 0.5 mg via RESPIRATORY_TRACT
  Filled 2012-03-30: qty 2.5

## 2012-03-30 MED ORDER — METHYLPREDNISOLONE SODIUM SUCC 125 MG IJ SOLR: 125.0000 mg | Freq: Once | INTRAMUSCULAR | Status: DC

## 2012-03-30 MED ORDER — MORPHINE SULFATE 4 MG/ML IJ SOLN
4.0000 mg | Freq: Once | INTRAMUSCULAR | Status: AC
  Administered 2012-03-30: 4 mg via INTRAVENOUS
  Filled 2012-03-30: qty 1

## 2012-03-30 MED ORDER — ALBUTEROL (5 MG/ML) CONTINUOUS INHALATION SOLN
10.0000 mg/h | INHALATION_SOLUTION | Freq: Once | RESPIRATORY_TRACT | Status: AC
  Administered 2012-03-30: 10 mg/h via RESPIRATORY_TRACT

## 2012-03-30 MED ORDER — HYDROCODONE-ACETAMINOPHEN 5-325 MG PO TABS
2.0000 | ORAL_TABLET | Freq: Once | ORAL | Status: AC
  Administered 2012-03-30: 2 via ORAL
  Filled 2012-03-30: qty 2

## 2012-03-30 MED ORDER — ALBUTEROL SULFATE (5 MG/ML) 0.5% IN NEBU
2.5000 mg | INHALATION_SOLUTION | Freq: Once | RESPIRATORY_TRACT | Status: AC
  Administered 2012-03-30: 2.5 mg via RESPIRATORY_TRACT
  Filled 2012-03-30: qty 0.5

## 2012-03-30 MED ORDER — ASPIRIN 81 MG PO CHEW
162.0000 mg | CHEWABLE_TABLET | Freq: Once | ORAL | Status: AC
  Administered 2012-03-30: 162 mg via ORAL
  Filled 2012-03-30: qty 4

## 2012-03-30 NOTE — Progress Notes (Signed)
Attempts made to obtain Peak Flow. Unable to obtain accurate flows due to patient having newly made dentures. When the dentures are removed, he is not able to keep a seal around the edges.

## 2012-03-30 NOTE — ED Notes (Signed)
Pt reports worsening SHOB x 2 days; worse tonight; pt to ED via EMS w/ c/o [facility] and chest pain; pt received 10mg  Albuterol, 0.5 Atrovent, and 125 mg Solumedrol en route via EMS; pt c/o left sided chest pain worse with cough; Resp labored and accessory muscle use; dyspnea at rest; lungs diminished throughout all lobes; NP cough.

## 2012-03-31 MED ORDER — AZITHROMYCIN 250 MG PO TABS: 250.0000 mg | ORAL_TABLET | Freq: Every day | ORAL | Status: DC

## 2012-03-31 MED ORDER — IBUPROFEN 600 MG PO TABS: 600.0000 mg | ORAL_TABLET | Freq: Four times a day (QID) | ORAL | Status: DC | PRN

## 2012-03-31 MED ORDER — HYDROCODONE-ACETAMINOPHEN 5-325 MG PO TABS: 1.0000 | ORAL_TABLET | ORAL | Status: DC | PRN

## 2012-03-31 MED ORDER — PREDNISONE 50 MG PO TABS: 50.0000 mg | ORAL_TABLET | Freq: Every day | ORAL | Status: DC

## 2012-03-31 NOTE — ED Provider Notes (Signed)
History     CSN: [id number]  Arrival date & time 03/30/12  2024   First MD Initiated Contact with Patient 03/30/12 2045      Chief Complaint  Patient presents with  . Respiratory Distress  . COPD    (Consider location/radiation/quality/duration/timing/severity/associated sxs/prior treatment) HPI Comments: Pt comes in with cc of sob, cough. Pt reports having COPD hx, and over the past 3 days he has sloly started getting increased dyspnea, with wheezing, worsening cough and new phlegm. No fevers, chills. No new chest pain, although with cough he has left sided chest pain. Pt has taken 10 treatments today with transient relief. Continues to smoke.  The history is provided by the patient and medical records.    Past Medical History  Diagnosis Date  . Thyroid disease     hyper, not on meds  . COPD (chronic obstructive pulmonary disease)   . Hodgkin's disease(201) 1993    with abdominal surgical scar -pt unable to describe   . Spontaneous pneumothorax     left  . Spontaneous pneumothorax     right  . Pneumonia   . Chronic respiratory failure   . Pneumonia     Past Surgical History  Procedure Date  . Splenectomy   . Chest tube insertion     left  . Chest tube insertion     right    No family history on file.  History  Substance Use Topics  . Smoking status: Current Every Day Smoker -- 0.2 packs/day for 41 years    Types: Cigarettes  . Smokeless tobacco: Not on file  . Alcohol Use: 3.6 oz/week    6 Cans of beer per week      Review of Systems  Constitutional: Negative for activity change and appetite change.  Respiratory: Positive for cough, shortness of breath and wheezing.   Cardiovascular: Positive for chest pain.  Gastrointestinal: Negative for abdominal pain.  Genitourinary: Negative for dysuria.    Allergies  Codeine  Home Medications   Current Outpatient Rx  Name  Route  Sig  Dispense  Refill  . ALBUTEROL SULFATE HFA 108 (90 BASE) MCG/ACT IN  AERS   Inhalation   Inhale 2 puffs into the lungs every 6 (six) hours as needed. wheezing         . BUDESONIDE-FORMOTEROL FUMARATE 160-4.5 MCG/ACT IN AERO   Inhalation   Inhale 2 puffs into the lungs 2 (two) times daily.         [name] FAMOTIDINE 10 MG PO CHEW   Oral   Chew 10 mg by mouth daily as needed. heartache         . LOPERAMIDE HCL 2 MG PO CAPS   Oral   Take 2 mg by mouth 4 (four) times daily as needed. For diarrhea         . TIOTROPIUM BROMIDE MONOHYDRATE 18 MCG IN CAPS   Inhalation   Place 1 capsule (18 mcg total) into inhaler and inhale daily.   30 capsule   1     BP 129/76  Pulse 104  Temp 98.8 F (37.1 C) (Oral)  Resp 23  SpO2 91%  Physical Exam  Constitutional: He is oriented to person, place, and time. He appears well-developed.  HENT:  Head: Normocephalic and atraumatic.  Eyes: Conjunctivae normal and EOM are normal. Pupils are equal, round, and reactive to light.  Neck: Normal range of motion. Neck supple.  Cardiovascular: Normal rate, regular rhythm and normal heart sounds.  Pulmonary/Chest: He is in respiratory distress. He has wheezes. He exhibits no tenderness.  Abdominal: Soft. Bowel sounds are normal. He exhibits no distension. There is no tenderness. There is no rebound and no guarding.  Neurological: He is alert and oriented to person, place, and time.  Skin: Skin is warm.    ED Course  Procedures (including critical care time)  Labs Reviewed  CBC WITH DIFFERENTIAL - Abnormal; Notable for the following:    WBC 18.2 (*)     RDW 15.8 (*)     Neutrophils Relative 90 (*)     Neutro Abs 16.4 (*)     Lymphocytes Relative 6 (*)     All other components within normal limits  COMPREHENSIVE METABOLIC PANEL - Abnormal; Notable for the following:    Potassium 3.2 (*)     Glucose, Bld 115 (*)     All other components within normal limits  TROPONIN I   Dg Chest Portable 1 View  03/30/2012  *RADIOLOGY REPORT*  Clinical Data: Shortness of  breath and history of pneumonia.  PORTABLE CHEST - 1 VIEW  Comparison: 01/20/2012  Findings: Numerous leads and wires project over the chest.  Remote left rib fractures.  Surgical clips in the left upper quadrant. Normal heart size.  Age advanced aortic atherosclerosis.  Trace right pleural fluid or thickening, chronic. No pneumothorax. Bullous emphysema.  Chronic interstitial thickening.  Resolved lingular airspace disease.  No acute lobar consolidation.  IMPRESSION: Bullous emphysema and chronic bronchitis.  Resolved lingular airspace disease.  No definite acute pneumonia identified.  Trace right pleural fluid thickening.   Original Report Authenticated By: Jeronimo Greaves, M.D.      No diagnosis found.    MDM   Date: 03/31/2012  Rate: 111  Rhythm: sinus tachycardia  QRS Axis: normal  Intervals: normal  ST/T Wave abnormalities: nonspecific ST/T changes  Conduction Disutrbances:none  Narrative Interpretation:   Old EKG Reviewed: unchanged  Differential diagnosis includes: ACS syndrome CHF exacerbation Valvular disorder Myocarditis Pericarditis Pericardial effusion Pneumonia Pleural effusion Pulmonary edema PE Anemia Musculoskeletal pain  Pt comes in with cc of sob, cough, wheezing. Pt is respiratory distress at arrival, but has improved with breathing tx. Pt has worsening cough and phlegm, so even if the CXR is negative, he will get antibiotics.  1:50 AM Post reassessment x 3, pt has improved dramatically with lung exam, and the wheezing has subsided, with better air entry than at arrival. He is no longer in distress. CXR is normal. Will send home with steroids and antibiotics.  The patient was counseled on the dangers of tobacco use, and was advised to quit.  Reviewed strategies to maximize success, including stress management, substitution of other forms of reinforcement and support of family/friends.  CRITICAL CARE Performed by: Derwood Kaplan   Total critical care  time: 30 minutes  Critical care time was exclusive of separately billable procedures and treating other patients.  Critical care was necessary to treat or prevent imminent or life-threatening deterioration.  Critical care was time spent personally by me on the following activities: development of treatment plan with patient and/or surrogate as well as nursing, discussions with consultants, evaluation of patient's response to treatment, examination of patient, obtaining history from patient or surrogate, ordering and performing treatments and interventions, ordering and review of laboratory studies, ordering and review of radiographic studies, pulse oximetry and re-evaluation of patient's condition.         Derwood Kaplan, MD 03/31/12 [phone]

## 2012-04-01 ENCOUNTER — Encounter (HOSPITAL_COMMUNITY): Payer: Self-pay | Admitting: Emergency Medicine

## 2012-04-01 ENCOUNTER — Inpatient Hospital Stay (HOSPITAL_COMMUNITY)
Admission: EM | Admit: 2012-04-01 | Discharge: 2012-04-03 | DRG: 190 | Disposition: A | Discharge status: Home / Self Care | Payer: Medicare Other | Attending: Internal Medicine | Admitting: Internal Medicine

## 2012-04-01 ENCOUNTER — Emergency Department (HOSPITAL_COMMUNITY): Payer: Medicare Other

## 2012-04-01 ENCOUNTER — Inpatient Hospital Stay (HOSPITAL_COMMUNITY): Payer: Medicare Other

## 2012-04-01 DIAGNOSIS — R7989 Other specified abnormal findings of blood chemistry: Secondary | ICD-10-CM

## 2012-04-01 DIAGNOSIS — J9612 Chronic respiratory failure with hypercapnia: Secondary | ICD-10-CM

## 2012-04-01 DIAGNOSIS — F17200 Nicotine dependence, unspecified, uncomplicated: Secondary | ICD-10-CM | POA: Diagnosis present

## 2012-04-01 DIAGNOSIS — F1010 Alcohol abuse, uncomplicated: Secondary | ICD-10-CM | POA: Diagnosis present

## 2012-04-01 DIAGNOSIS — R0902 Hypoxemia: Secondary | ICD-10-CM

## 2012-04-01 DIAGNOSIS — R079 Chest pain, unspecified: Secondary | ICD-10-CM | POA: Diagnosis present

## 2012-04-01 DIAGNOSIS — C8190 Hodgkin lymphoma, unspecified, unspecified site: Secondary | ICD-10-CM | POA: Diagnosis present

## 2012-04-01 DIAGNOSIS — J441 Chronic obstructive pulmonary disease with (acute) exacerbation: Principal | ICD-10-CM | POA: Diagnosis present

## 2012-04-01 DIAGNOSIS — E43 Unspecified severe protein-calorie malnutrition: Secondary | ICD-10-CM | POA: Diagnosis present

## 2012-04-01 DIAGNOSIS — Z720 Tobacco use: Secondary | ICD-10-CM

## 2012-04-01 DIAGNOSIS — J209 Acute bronchitis, unspecified: Secondary | ICD-10-CM | POA: Diagnosis present

## 2012-04-01 DIAGNOSIS — J449 Chronic obstructive pulmonary disease, unspecified: Secondary | ICD-10-CM

## 2012-04-01 DIAGNOSIS — J189 Pneumonia, unspecified organism: Secondary | ICD-10-CM

## 2012-04-01 DIAGNOSIS — Z681 Body mass index (BMI) 19 or less, adult: Secondary | ICD-10-CM

## 2012-04-01 DIAGNOSIS — J9620 Acute and chronic respiratory failure, unspecified whether with hypoxia or hypercapnia: Secondary | ICD-10-CM | POA: Diagnosis present

## 2012-04-01 DIAGNOSIS — J939 Pneumothorax, unspecified: Secondary | ICD-10-CM

## 2012-04-01 DIAGNOSIS — R748 Abnormal levels of other serum enzymes: Secondary | ICD-10-CM | POA: Diagnosis present

## 2012-04-01 DIAGNOSIS — E871 Hypo-osmolality and hyponatremia: Secondary | ICD-10-CM

## 2012-04-01 DIAGNOSIS — Z8571 Personal history of Hodgkin lymphoma: Secondary | ICD-10-CM

## 2012-04-01 DIAGNOSIS — Z9981 Dependence on supplemental oxygen: Secondary | ICD-10-CM

## 2012-04-01 LAB — BLOOD GAS, ARTERIAL
Acid-base deficit: 0.2 mmol/L (ref 0.0–2.0)
Bicarbonate: 26.7 mEq/L — ABNORMAL HIGH (ref 20.0–24.0)
Drawn by: 244901
FIO2: 0.5 %
O2 Saturation: 98.5 %
TCO2: 23.6 mmol/L (ref 0–100)
pCO2 arterial: 54.7 mmHg — ABNORMAL HIGH (ref 35.0–45.0)
pO2, Arterial: 130 mmHg — ABNORMAL HIGH (ref 80.0–100.0)

## 2012-04-01 LAB — D-DIMER, QUANTITATIVE: D-Dimer, Quant: 0.49 ug/mL-FEU — ABNORMAL HIGH (ref 0.00–0.48)

## 2012-04-01 LAB — CBC
HCT: 47.2 % (ref 39.0–52.0)
MCHC: 32.8 g/dL (ref 30.0–36.0)
Platelets: 341 10*3/uL (ref 150–400)
RDW: 15.9 % — ABNORMAL HIGH (ref 11.5–15.5)
WBC: 24.3 10*3/uL — ABNORMAL HIGH (ref 4.0–10.5)

## 2012-04-01 LAB — PROTIME-INR
INR: 0.98 (ref 0.00–1.49)
Prothrombin Time: 12.9 seconds (ref 11.6–15.2)

## 2012-04-01 LAB — COMPREHENSIVE METABOLIC PANEL
ALT: 19 U/L (ref 0–53)
AST: 32 U/L (ref 0–37)
Albumin: 3.8 g/dL (ref 3.5–5.2)
Alkaline Phosphatase: 86 U/L (ref 39–117)
Chloride: 97 mEq/L (ref 96–112)
Potassium: 3.6 mEq/L (ref 3.5–5.1)
Sodium: 137 mEq/L (ref 135–145)
Total Bilirubin: 0.5 mg/dL (ref 0.3–1.2)
Total Protein: 7.5 g/dL (ref 6.0–8.3)

## 2012-04-01 LAB — PRO B NATRIURETIC PEPTIDE: Pro B Natriuretic peptide (BNP): 686.8 pg/mL — ABNORMAL HIGH (ref 0–125)

## 2012-04-01 LAB — TROPONIN I
Troponin I: <0.3 ng/mL (ref <0.30)
Troponin I: 0.98 ng/mL (ref <0.30)

## 2012-04-01 MED ORDER — ONDANSETRON HCL 4 MG/2ML IJ SOLN
4.0000 mg | Freq: Once | INTRAMUSCULAR | Status: AC
  Administered 2012-04-01: 4 mg via INTRAVENOUS
  Filled 2012-04-01: qty 2

## 2012-04-01 MED ORDER — HEPARIN (PORCINE) IN NACL 100-0.45 UNIT/ML-% IJ SOLN
550.0000 [IU]/h | INTRAMUSCULAR | Status: DC
  Administered 2012-04-01: 550 [IU]/h via INTRAVENOUS
  Filled 2012-04-01: qty 250

## 2012-04-01 MED ORDER — GUAIFENESIN ER 600 MG PO TB12
600.0000 mg | ORAL_TABLET | Freq: Two times a day (BID) | ORAL | Status: DC
  Administered 2012-04-01 – 2012-04-03 (×5): 600 mg via ORAL
  Filled 2012-04-01 (×7): qty 1

## 2012-04-01 MED ORDER — IBUPROFEN 600 MG PO TABS
600.0000 mg | ORAL_TABLET | Freq: Four times a day (QID) | ORAL | Status: DC | PRN
  Filled 2012-04-01: qty 1

## 2012-04-01 MED ORDER — MORPHINE SULFATE 2 MG/ML IJ SOLN
2.0000 mg | INTRAMUSCULAR | Status: DC | PRN
  Administered 2012-04-01 – 2012-04-03 (×8): 2 mg via INTRAVENOUS
  Filled 2012-04-01 (×9): qty 1

## 2012-04-01 MED ORDER — SODIUM CHLORIDE 0.9 % IV SOLN: INTRAVENOUS | Status: AC

## 2012-04-01 MED ORDER — METHYLPREDNISOLONE SODIUM SUCC 125 MG IJ SOLR
125.0000 mg | Freq: Every day | INTRAMUSCULAR | Status: DC
  Administered 2012-04-01: 125 mg via INTRAVENOUS
  Filled 2012-04-01: qty 2

## 2012-04-01 MED ORDER — SODIUM CHLORIDE 0.9 % IJ SOLN: 3.0000 mL | INTRAMUSCULAR | Status: DC | PRN

## 2012-04-01 MED ORDER — ONDANSETRON HCL 4 MG/2ML IJ SOLN: 4.0000 mg | Freq: Four times a day (QID) | INTRAMUSCULAR | Status: DC | PRN

## 2012-04-01 MED ORDER — LEVOFLOXACIN IN D5W 750 MG/150ML IV SOLN
750.0000 mg | INTRAVENOUS | Status: AC
  Administered 2012-04-01 – 2012-04-02 (×2): 750 mg via INTRAVENOUS
  Filled 2012-04-01 (×2): qty 150

## 2012-04-01 MED ORDER — NICOTINE 21 MG/24HR TD PT24
21.0000 mg | MEDICATED_PATCH | Freq: Every day | TRANSDERMAL | Status: DC
  Administered 2012-04-01 – 2012-04-03 (×3): 21 mg via TRANSDERMAL
  Filled 2012-04-01 (×3): qty 1

## 2012-04-01 MED ORDER — FAMOTIDINE 10 MG PO CHEW: 10.0000 mg | CHEWABLE_TABLET | Freq: Every day | ORAL | Status: DC | PRN

## 2012-04-01 MED ORDER — ATORVASTATIN CALCIUM 40 MG PO TABS
40.0000 mg | ORAL_TABLET | Freq: Every day | ORAL | Status: DC
  Administered 2012-04-02: 40 mg via ORAL
  Filled 2012-04-01 (×2): qty 1

## 2012-04-01 MED ORDER — ALBUTEROL SULFATE (5 MG/ML) 0.5% IN NEBU
2.5000 mg | INHALATION_SOLUTION | RESPIRATORY_TRACT | Status: DC | PRN
  Filled 2012-04-01 (×2): qty 0.5

## 2012-04-01 MED ORDER — SODIUM CHLORIDE 0.9 % IJ SOLN
3.0000 mL | Freq: Two times a day (BID) | INTRAMUSCULAR | Status: DC
  Administered 2012-04-01: 21:00:00 via INTRAVENOUS
  Administered 2012-04-01 – 2012-04-02 (×3): 3 mL via INTRAVENOUS

## 2012-04-01 MED ORDER — TIOTROPIUM BROMIDE MONOHYDRATE 18 MCG IN CAPS
18.0000 ug | ORAL_CAPSULE | Freq: Every day | RESPIRATORY_TRACT | Status: DC
  Administered 2012-04-01 – 2012-04-03 (×3): 18 ug via RESPIRATORY_TRACT
  Filled 2012-04-01: qty 5

## 2012-04-01 MED ORDER — SODIUM CHLORIDE 0.9 % IV SOLN
250.0000 mL | INTRAVENOUS | Status: DC | PRN
  Administered 2012-04-03: 250 mL via INTRAVENOUS

## 2012-04-01 MED ORDER — ALBUTEROL SULFATE (5 MG/ML) 0.5% IN NEBU
15.0000 mg | INHALATION_SOLUTION | Freq: Once | RESPIRATORY_TRACT | Status: AC
  Administered 2012-04-01: 15 mg via RESPIRATORY_TRACT
  Filled 2012-04-01 (×2): qty 3

## 2012-04-01 MED ORDER — MAGNESIUM SULFATE 50 % IJ SOLN: 2.0000 g | Freq: Once | INTRAMUSCULAR | Status: DC

## 2012-04-01 MED ORDER — BIOTENE DRY MOUTH MT LIQD
15.0000 mL | Freq: Two times a day (BID) | OROMUCOSAL | Status: DC
  Administered 2012-04-01 – 2012-04-03 (×5): 15 mL via OROMUCOSAL

## 2012-04-01 MED ORDER — VITAMIN B-1 100 MG PO TABS
100.0000 mg | ORAL_TABLET | Freq: Every day | ORAL | Status: DC
  Administered 2012-04-01 – 2012-04-03 (×3): 100 mg via ORAL
  Filled 2012-04-01 (×3): qty 1

## 2012-04-01 MED ORDER — HYDROCODONE-ACETAMINOPHEN 5-325 MG PO TABS
1.0000 | ORAL_TABLET | ORAL | Status: DC | PRN
  Administered 2012-04-01 – 2012-04-03 (×7): 1 via ORAL
  Filled 2012-04-01 (×7): qty 1

## 2012-04-01 MED ORDER — FAMOTIDINE 10 MG PO TABS
10.0000 mg | ORAL_TABLET | Freq: Every day | ORAL | Status: DC | PRN
  Filled 2012-04-01: qty 1

## 2012-04-01 MED ORDER — ASPIRIN 325 MG PO TABS
325.0000 mg | ORAL_TABLET | Freq: Every day | ORAL | Status: DC
  Administered 2012-04-01 – 2012-04-03 (×3): 325 mg via ORAL
  Filled 2012-04-01 (×3): qty 1

## 2012-04-01 MED ORDER — METHYLPREDNISOLONE SODIUM SUCC 125 MG IJ SOLR
60.0000 mg | Freq: Four times a day (QID) | INTRAMUSCULAR | Status: DC
  Administered 2012-04-01: 60 mg via INTRAVENOUS
  Administered 2012-04-01: 21:00:00 via INTRAVENOUS
  Administered 2012-04-02: 60 mg via INTRAVENOUS
  Administered 2012-04-02: 04:00:00 via INTRAVENOUS
  Administered 2012-04-02 – 2012-04-03 (×5): 60 mg via INTRAVENOUS
  Filled 2012-04-01 (×13): qty 0.96

## 2012-04-01 MED ORDER — MORPHINE SULFATE 4 MG/ML IJ SOLN
4.0000 mg | Freq: Once | INTRAMUSCULAR | Status: AC
  Administered 2012-04-01: 4 mg via INTRAVENOUS
  Filled 2012-04-01: qty 1

## 2012-04-01 MED ORDER — HEPARIN BOLUS VIA INFUSION
2500.0000 [IU] | Freq: Once | INTRAVENOUS | Status: AC
  Administered 2012-04-01: 2500 [IU] via INTRAVENOUS
  Filled 2012-04-01: qty 2500

## 2012-04-01 MED ORDER — ALBUTEROL SULFATE (5 MG/ML) 0.5% IN NEBU
2.5000 mg | INHALATION_SOLUTION | RESPIRATORY_TRACT | Status: DC
  Administered 2012-04-01 – 2012-04-02 (×5): 2.5 mg via RESPIRATORY_TRACT
  Filled 2012-04-01 (×4): qty 0.5

## 2012-04-01 MED ORDER — MAGNESIUM SULFATE 40 MG/ML IJ SOLN
2.0000 g | Freq: Once | INTRAMUSCULAR | Status: AC
  Administered 2012-04-01: 2 g via INTRAVENOUS
  Filled 2012-04-01: qty 50

## 2012-04-01 MED ORDER — HEPARIN SODIUM (PORCINE) 5000 UNIT/ML IJ SOLN
5000.0000 [IU] | Freq: Three times a day (TID) | INTRAMUSCULAR | Status: DC
  Administered 2012-04-01: 5000 [IU] via SUBCUTANEOUS
  Filled 2012-04-01 (×3): qty 1

## 2012-04-01 MED ORDER — FOLIC ACID 1 MG PO TABS
1.0000 mg | ORAL_TABLET | Freq: Every day | ORAL | Status: DC
  Administered 2012-04-01 – 2012-04-03 (×3): 1 mg via ORAL
  Filled 2012-04-01 (×3): qty 1

## 2012-04-01 MED ORDER — ONDANSETRON HCL 4 MG PO TABS: 4.0000 mg | ORAL_TABLET | Freq: Four times a day (QID) | ORAL | Status: DC | PRN

## 2012-04-01 MED ORDER — IOHEXOL 350 MG/ML SOLN
100.0000 mL | Freq: Once | INTRAVENOUS | Status: AC | PRN
  Administered 2012-04-01: 100 mL via INTRAVENOUS

## 2012-04-01 NOTE — Progress Notes (Addendum)
  Comment:  Patient has elevated troponin of 0.92. D-Dimer is also elevated at 0.49.   Plan: 1. ivi Heparin, to cover possibility of both NSTEMI and PE.  2. Low dose ASA. 3. CTA. 4. 2D Echocardiogram.  5. Continue cycling cardiac enzymes.   If progressive rise in troponin, chest pain and or negative CTA, will consult cardiology.   C. Colter Magowan. MD, FACP.

## 2012-04-01 NOTE — Progress Notes (Signed)
56 yo male with hx copd w/ 30 year smoking hx. Has elev troponin >0.9 with D-dimer 0.49. EKG done at 1930 per order, morphine 2mg  prn for discomfort, on heparin 550 unit/hr, 2D echo due in am, continue cardiac enzymes, cardiology consult in am. Smoking cessation education reviewed with patient teach back method.

## 2012-04-01 NOTE — ED Provider Notes (Signed)
History     CSN: [id number]  Arrival date & time 04/01/12  0604   First MD Initiated Contact with Patient 04/01/12 [phone]      Chief Complaint  Patient presents with  . Respiratory Distress    (Consider location/radiation/quality/duration/timing/severity/associated sxs/prior treatment) HPI  Pt to the ER with COPD exacerbation. He was seen the previous night for the same and returns with severe dyspnea. The patient is irritable and does not want to ask questions due to increased effort. He is able speak in  broken sentences and tells me he began to feel bad after being discharge from the ER during his last  visit.  He denies a new chest pain but had a left sided pain with cough. Pt continues to smoke   Past Medical History  Diagnosis Date  . Thyroid disease     hyper, not on meds  . COPD (chronic obstructive pulmonary disease)   . Hodgkin's disease(201) 1993    with abdominal surgical scar -pt unable to describe   . Spontaneous pneumothorax     left  . Spontaneous pneumothorax     right  . Pneumonia   . Chronic respiratory failure   . Pneumonia     Past Surgical History  Procedure Date  . Splenectomy   . Chest tube insertion     left  . Chest tube insertion     right    History reviewed. No pertinent family history.  History  Substance Use Topics  . Smoking status: Current Every Day Smoker -- 0.2 packs/day for 41 years    Types: Cigarettes  . Smokeless tobacco: Not on file  . Alcohol Use: 3.6 oz/week    6 Cans of beer per week      Review of Systems Unable to obtain sufficient ROS due to patient respiratory distress   Allergies  Codeine  Home Medications   Current Outpatient Rx  Name  Route  Sig  Dispense  Refill  . ALBUTEROL SULFATE HFA 108 (90 BASE) MCG/ACT IN AERS   Inhalation   Inhale 2 puffs into the lungs every 6 (six) hours as needed. wheezing         . AZITHROMYCIN 250 MG PO TABS   Oral   Take 1 tablet (250 mg total) by mouth  daily. Take first 2 tablets together, then 1 every day until finished.   6 tablet   0   . BUDESONIDE-FORMOTEROL FUMARATE 160-4.5 MCG/ACT IN AERO   Inhalation   Inhale 2 puffs into the lungs 2 (two) times daily.         [name] FAMOTIDINE 10 MG PO CHEW   Oral   Chew 10 mg by mouth daily as needed. heartache         . HYDROCODONE-ACETAMINOPHEN 5-325 MG PO TABS   Oral   Take 1 tablet by mouth every 4 (four) hours as needed for pain.   10 tablet   0   . IBUPROFEN 600 MG PO TABS   Oral   Take 1 tablet (600 mg total) by mouth every 6 (six) hours as needed for pain.   30 tablet   0   . LOPERAMIDE HCL 2 MG PO CAPS   Oral   Take 2 mg by mouth 4 (four) times daily as needed. For diarrhea         . PREDNISONE 50 MG PO TABS   Oral   Take 1 tablet (50 mg total) by mouth daily.   5 tablet  0   . TIOTROPIUM BROMIDE MONOHYDRATE 18 MCG IN CAPS   Inhalation   Place 1 capsule (18 mcg total) into inhaler and inhale daily.   30 capsule   1     Pulse 118  Resp 30  SpO2 100%  Physical Exam  Nursing note and vitals reviewed. Constitutional: He appears well-developed and well-nourished. He appears distressed.  HENT:  Head: Normocephalic and atraumatic.  Eyes: Pupils are equal, round, and reactive to light.  Neck: Normal range of motion. Neck supple.  Cardiovascular: Regular rhythm.  Tachycardia present.   Pulmonary/Chest: He is in respiratory distress (decreased airway movement). He has wheezes. He exhibits tenderness.  Abdominal: Soft.  Neurological: He is alert.  Skin: Skin is warm and dry. He is not diaphoretic.    ED Course  Procedures (including critical care time)  Labs Reviewed  CBC - Abnormal; Notable for the following:    WBC 24.3 (*)     RDW 15.9 (*)     All other components within normal limits  COMPREHENSIVE METABOLIC PANEL - Abnormal; Notable for the following:    Glucose, Bld 122 (*)     All other components within normal limits  BLOOD GAS, ARTERIAL -  Abnormal; Notable for the following:    pH, Arterial 7.310 (*)     pCO2 arterial 54.7 (*)     pO2, Arterial 130.0 (*)     Bicarbonate 26.7 (*)     All other components within normal limits  PRO B NATRIURETIC PEPTIDE - Abnormal; Notable for the following:    Pro B Natriuretic peptide (BNP) 686.8 (*)     All other components within normal limits  TROPONIN I   Dg Chest Portable 1 View  04/01/2012  *RADIOLOGY REPORT*  Clinical Data: Respiratory distress.  PORTABLE CHEST - 1 VIEW  Comparison: 03/30/2012  Findings: Normal heart size and pulmonary vascularity.  Bullous emphysematous changes in the lungs with scattered interstitial fibrosis.  There is increasing perihilar infiltration in the lungs predominately on the left.  This may represent developing pneumonia or edema.  No blunting of costophrenic angles.  No pneumothorax. Old left rib fractures.  IMPRESSION: Emphysematous changes and fibrosis in the lungs.  Developing perihilar infiltration.   Original Report Authenticated By: Burman Nieves, M.D.    Dg Chest Portable 1 View  03/30/2012  *RADIOLOGY REPORT*  Clinical Data: Shortness of breath and history of pneumonia.  PORTABLE CHEST - 1 VIEW  Comparison: 01/20/2012  Findings: Numerous leads and wires project over the chest.  Remote left rib fractures.  Surgical clips in the left upper quadrant. Normal heart size.  Age advanced aortic atherosclerosis.  Trace right pleural fluid or thickening, chronic. No pneumothorax. Bullous emphysema.  Chronic interstitial thickening.  Resolved lingular airspace disease.  No acute lobar consolidation.  IMPRESSION: Bullous emphysema and chronic bronchitis.  Resolved lingular airspace disease.  No definite acute pneumonia identified.  Trace right pleural fluid thickening.   Original Report Authenticated By: Jeronimo Greaves, M.D.      1. COPD with acute exacerbation       MDM   Date: 04/01/2012  Rate: 130  Rhythm: sinus tachycardia  QRS Axis: normal   Intervals: normal  ST/T Wave abnormalities: nonspecific ST changes  Conduction Disutrbances:none  Narrative Interpretation:   Old EKG Reviewed: none available   ABG results in. Pt most likely in acute superimposed on chronic primary respiratory acidosis. Labs ordered, Dr. Dierdre Highman saw pt before I did and ordered all labs and images. Pt  to be admitted once results return.   Hour long neb, IV solumedrol, Levaquin ordered.  7:42am- pts symptoms are much improving. He is still having retractions and increased work effort. This is his second visit within 48 hours for the same. Will need admission this time.   CRITICAL CARE Performed by: Dorthula Matas   Total critical care time:30  Critical care time was exclusive of separately billable procedures and treating other patients.  Critical care was necessary to treat or prevent imminent or life-threatening deterioration.  Critical care was time spent personally by me on the following activities: development of treatment plan with patient and/or surrogate as well as nursing, discussions with consultants, evaluation of patient's response to treatment, examination of patient, obtaining history from patient , ordering and performing treatments and interventions, ordering and review of laboratory studies, ordering and review of radiographic studies, pulse oximetry and re-evaluation of patient's condition.   8:16 AM- Pt admitted to Team 3, Dr. Octavio Graves, Step Down, [facility] inpatient    Dorthula Matas, [place] 04/01/12 [phone]

## 2012-04-01 NOTE — Consult Note (Deleted)
Cardiology Consult Note  Admit date: 04/01/2012 Name: Philip Coleman 56 y.o.  male DOB:  1956/03/19 MRN:  [id number]  Today's date:  04/01/2012  Referring Physician:    Dr. Ricke Hey  Reason for Consultation:  Abnormal troponin, chest pain  IMPRESSIONS: 1. Minimal elevation of troponin in a patient with a primary exacerbation of COPD and a diagnosis of atypical chest pain 2. Coronary artery disease with coronary calcifications and aortic calcifications as noted on CT scan 3. Severe COPD and emphysema oxygen dependent 4. Ongoing tobacco abuse 5. Remote history of treatment for Hodgkin's disease  RECOMMENDATION: The patient has chronic chest pain since his pleurodesed this that was done surgically. He has advanced COPD and continues to smoke. He does have evidence of coronary artery disease on CT scan as evidenced by coronary calcification and is at risk for this because of his tobacco abuse as well as his previous treatment for Hodgkin's which most likely involved mediastinal radiation.  Likely the elevation of troponin is due to demand ischemia and I suspect his primary problem is severe COPD and exacerbation. I would continue to treat his COPD exacerbation. I would go adding impaired for treating for coronary artery disease including treatment with lipid-lowering therapy, aspirin. His functional status is low. He also needs to discontinue smoking. Further cardiac workup will depend upon his clinical course and subsequent EKGs.   HISTORY: This 56 year-old male has advanced COPD and is currently oxygen dependent. He has smoked heavily for years and continues to smoke around one pack of cigarettes per day. He has had multiple admissions for COPD and is noncompliant with his therapy. He normally has no chest pain suggestive of angina. He was admitted with another exacerbation of COPD and dyspnea. Treated earlier this year for a lingual pneumonia. He was admitted with left-sided chest pain as  well as an exacerbation of COPD. The patient states he has had chronic left-sided chest pain that has been present since his pleurodesed this in May of this year. He does not have exertional tightness or heaviness suggestive of angina. He complains of nocturnal leg cramps but has no claudication.    Past Medical History  Diagnosis Date  . Thyroid disease     hyper, not on meds  . COPD (chronic obstructive pulmonary disease)   . Hodgkin's disease(201) 1993    with abdominal surgical scar -pt unable to describe   . Spontaneous pneumothorax     left  . Spontaneous pneumothorax     right  . Pneumonia   . Chronic respiratory failure   . Pneumonia       Past Surgical History  Procedure Date  . Splenectomy   . Chest tube insertion     left  . Chest tube insertion     right    Allergies:   is allergic to codeine.   Medications: Prior to Admission medications   Medication Sig Start Date End Date Taking? Authorizing Provider  albuterol (PROVENTIL HFA;VENTOLIN HFA) 108 (90 BASE) MCG/ACT inhaler Inhale 2 puffs into the lungs every 6 (six) hours as needed. wheezing   Yes Historical Provider, MD  azithromycin (ZITHROMAX) 250 MG tablet Take 1 tablet (250 mg total) by mouth daily. Take first 2 tablets together, then 1 every day until finished. 03/31/12  Yes Derwood Kaplan, MD  budesonide-formoterol (SYMBICORT) 160-4.5 MCG/ACT inhaler Inhale 2 puffs into the lungs 2 (two) times daily.   Yes Historical Provider, MD  famotidine (PEPCID AC) 10 MG chewable tablet Chew  10 mg by mouth daily as needed. heartache   Yes Historical Provider, MD  HYDROcodone-acetaminophen (NORCO/VICODIN) 5-325 MG per tablet Take 1 tablet by mouth every 4 (four) hours as needed for pain. 03/31/12  Yes Derwood Kaplan, MD  ibuprofen (ADVIL,MOTRIN) 600 MG tablet Take 1 tablet (600 mg total) by mouth every 6 (six) hours as needed for pain. 03/31/12  Yes Derwood Kaplan, MD  loperamide (IMODIUM) 2 MG capsule Take 2 mg by mouth  4 (four) times daily as needed. For diarrhea   Yes Historical Provider, MD  predniSONE (DELTASONE) 50 MG tablet Take 1 tablet (50 mg total) by mouth daily. 03/31/12  Yes Derwood Kaplan, MD  tiotropium (SPIRIVA) 18 MCG inhalation capsule Place 1 capsule (18 mcg total) into inhaler and inhale daily. 12/26/11 12/25/12 Yes Estela Isaiah Blakes, MD   Family History: No family status information on file.   Social History:   reports that he has been smoking Cigarettes.  He has a 10.25 pack-year smoking history. He does not have any smokeless tobacco history on file. He reports that he drinks about 3.6 ounces of alcohol per week. He reports that he does not use illicit drugs.   Lives in a boarding house.   Review of Systems: He has chronic dyspnea wears oxygen at home. He has cramps in his legs at night. He has had frequent exacerbations of dyspnea as well as COPD. He has had chronic chest pain since his previous pleurodesed this. He has significant anxiety. He has a somewhat reduced appetite. Other than as noted above the remainder of the review of systems is unremarkable.  Physical Exam: Blood pressure 130/75, pulse 103, temperature 98.8 F (37.1 C), temperature source Oral, resp. rate 17, height 5\' 7"  (1.702 m), weight 45.4 kg (100 lb 1.4 oz), SpO2 95.00%.    General appearance: Thin, talkative white male appears cachectic in no acute distress Head: Normocephalic, without obvious abnormality, atraumatic Eyes: conjunctivae/corneas clear. PERRL, EOM's intact. Fundi not examined  Neck: no adenopathy, no carotid bruit, no JVD and supple, symmetrical, trachea midline Lungs: Rhonchi with diminished breath sounds, surgical scar present on left Heart: regular rate and rhythm, S1, S2 normal, no murmur, click, rub or gallop Abdomen: soft, non-tender; bowel sounds normal; no masses,  no organomegaly Rectal: deferred Extremities: extremities normal, atraumatic, no cyanosis or edema Pulses: 2+ and  symmetric Skin: Skin color, texture, turgor normal. No rashes or lesions Neurologic: Grossly normal  Labs: CBC  Basename 04/01/12 0624 03/30/12 2140  WBC 24.3* --  RBC 5.25 --  HGB 15.5 --  HCT 47.2 --  PLT 341 --  MCV 89.9 --  MCH 29.5 --  MCHC 32.8 --  RDW 15.9* --  LYMPHSABS -- 1.0  MONOABS -- 0.8  EOSABS -- 0.0  BASOSABS -- 0.0   CMP   Basename 04/01/12 0624  NA 137  K 3.6  CL 97  CO2 29  GLUCOSE 122*  BUN 11  CREATININE 0.68  CALCIUM 9.6  PROT 7.5  ALBUMIN 3.8  AST 32  ALT 19  ALKPHOS 86  BILITOT 0.5  GFRNONAA >90  GFRAA >90   BNP (last 3 results)  Basename 04/01/12 0624  PROBNP 686.8*   Cardiac Panel (last 3 results)  Basename 04/01/12 1823 04/01/12 1530 04/01/12 1040  CKTOTAL -- -- --  CKMB -- -- --  TROPONINI 0.98* 0.92* <0.30  RELINDX -- -- --     Radiology: Emphysematous changes and fibrosis in the lungs. Developing perihilar infiltration.  EKG: Sinus tachycardia, baseline artifact, nonspecific ST and T-wave changes  Signed:  W. Ashley Royalty MD [facility]   Cardiology Consultant  04/01/2012, 11:18 PM

## 2012-04-01 NOTE — Progress Notes (Signed)
CRITICAL VALUE ALERT  Critical value received:  Troponin 0.92 Date of notification: 04/01/12  Time of notification:  1645  Critical value read back:yes  Nurse who received alert:  Anthonette Legato  MD notified (1st page):  Dr. Brien Few  Time of first page:  1647  MD notified (2nd page):  Time of second page:  Responding MD:  Dr. Brien Few  Time MD responded:  1700

## 2012-04-01 NOTE — ED Notes (Signed)
Pt was seen yesterday in [facility] for the same condition. Pt is in obvious distress with retractions and shallow breathing. Hx of COPD and a current smoker.

## 2012-04-01 NOTE — ED Notes (Signed)
EKG printed and given to EDP Opitz for review 

## 2012-04-01 NOTE — H&P (Signed)
PCP:   Provider Not In System   Chief Complaint:  Shortness of breath.   HPI: This is a 56 year old male, with known history of tobacco abuse, ETOH abuse, severe protein-calorie malnutrition, Hodgkin's disease diagnosed about 30 years ago, treated with radiation, COPD/chronic respiratory failure on nocturnal O2 at 2L/min, recurrent spontaneous left s/p left VATS 09/22/11 by Dr [name], for stapling of blebs and mechanical pleurodesis. Patient was hospitalized 01/20/12-01/23/12, for lingular pneumonia, and according to him, he has felt unwell, since. Shortness of breath has been progressive, but on ii/23/13, he became acutely short of breath, with left-sided chest pain, wheeze, and a cough, productive of yellow phlegm, associated with subjective fever, butt no chills. He was seen in the ED on that date, and was discharged after bronchodilator treatment. He returns because of persistent/worsening symptoms, haad avery restless night, and early AM 04/01/12, called EMS. ED MD has administered iv Solumedrol and completed an hour-long nebulizer.   Allergies:   Allergies  Allergen Reactions  . Codeine Hives and Nausea And Vomiting      Past Medical History  Diagnosis Date  . Thyroid disease     hyper, not on meds  . COPD (chronic obstructive pulmonary disease)   . Hodgkin's disease(201) 1993    with abdominal surgical scar -pt unable to describe   . Spontaneous pneumothorax     left  . Spontaneous pneumothorax     right  . Pneumonia   . Chronic respiratory failure   . Pneumonia     Past Surgical History  Procedure Date  . Splenectomy   . Chest tube insertion     left  . Chest tube insertion     right    Prior to Admission medications   Medication Sig Start Date End Date Taking? Authorizing Provider  albuterol (PROVENTIL HFA;VENTOLIN HFA) 108 (90 BASE) MCG/ACT inhaler Inhale 2 puffs into the lungs every 6 (six) hours as needed. wheezing   Yes Historical Provider, MD    azithromycin (ZITHROMAX) 250 MG tablet Take 1 tablet (250 mg total) by mouth daily. Take first 2 tablets together, then 1 every day until finished. 03/31/12  Yes Derwood Kaplan, MD  budesonide-formoterol (SYMBICORT) 160-4.5 MCG/ACT inhaler Inhale 2 puffs into the lungs 2 (two) times daily.   Yes Historical Provider, MD  famotidine (PEPCID AC) 10 MG chewable tablet Chew 10 mg by mouth daily as needed. heartache   Yes Historical Provider, MD  HYDROcodone-acetaminophen (NORCO/VICODIN) 5-325 MG per tablet Take 1 tablet by mouth every 4 (four) hours as needed for pain. 03/31/12  Yes Derwood Kaplan, MD  ibuprofen (ADVIL,MOTRIN) 600 MG tablet Take 1 tablet (600 mg total) by mouth every 6 (six) hours as needed for pain. 03/31/12  Yes Derwood Kaplan, MD  loperamide (IMODIUM) 2 MG capsule Take 2 mg by mouth 4 (four) times daily as needed. For diarrhea   Yes Historical Provider, MD  predniSONE (DELTASONE) 50 MG tablet Take 1 tablet (50 mg total) by mouth daily. 03/31/12  Yes Derwood Kaplan, MD  tiotropium (SPIRIVA) 18 MCG inhalation capsule Place 1 capsule (18 mcg total) into inhaler and inhale daily. 12/26/11 12/25/12 Yes Estela Isaiah Blakes, MD    Social History: Patient leaves alone, reports that he has been smoking Cigarettes.  He has a 10.25 pack-year smoking history. He does not have any smokeless tobacco history on file. He reports that he drinks about 3.6 ounces of alcohol per week. He reports that he does not use illicit drugs.  Family History:  Patient's father died at age 18 years, after falling accidentally from a roof-top. His mother died at age 27 years, from lung cancer.   Review of Systems:  As per HPI and chief complaint. Patent has fatigue, diminished appetite, weight loss, although unable to quantify, fever, denies headache, blurred vision, slurred speech, dysphagia, paroxysmal nocturnal dyspnea, nausea, diaphoresis, abdominal pain, vomiting, diarrhea, belching, heartburn, hematemesis,  melena, dysuria, nocturia, urinary frequency, hematochezia, lower extremity swelling, pain, or redness. The rest of the systems review is negative.  Physical Exam:  General:  Patient is still short of breath at rest at the time of my evaluation, accessory respiratory musculature is in play, and he communicates in broken sentences. Fully oriented. He appears cachectic.  HEENT:  No clinical pallor, no jaundice, no conjunctival injection or discharge. Hydration status is satisfactory. NECK:  Supple, JVP not seen, no carotid bruits, no palpable lymphadenopathy, no palpable goiter. CHEST:  Breath sounds are quiet bilaterally, but expiatory rhonchi are head, Rt>Lt, no crackles. HEART:  Sounds 1 and 2 heard, normal, regular, no murmurs, mildly tachycardic. ABDOMEN:  Flat, soft, has a mid-line laparotomy scar, and radiotherapy markers. On abdominal wall. Non-tender, no palpable organomegaly, no palpable masses, normal bowel sounds. GENITALIA:  Not examined. LOWER EXTREMITIES:  No pitting edema, palpable peripheral pulses. MUSCULOSKELETAL SYSTEM:  Unremarkable. CENTRAL NERVOUS SYSTEM:  No focal neurologic deficit on gross examination.  Labs on Admission:  Results for orders placed during the hospital encounter of 04/01/12 (from the past 48 hour(s))  BLOOD GAS, ARTERIAL     Status: Abnormal   Collection Time   04/01/12  6:10 AM      Component Value Range Comment   FIO2 0.50      Delivery systems OXYGEN MASK      pH, Arterial 7.310 (*) 7.350 - 7.450    pCO2 arterial 54.7 (*) 35.0 - 45.0 mmHg    pO2, Arterial 130.0 (*) 80.0 - 100.0 mmHg    Bicarbonate 26.7 (*) 20.0 - 24.0 mEq/L    TCO2 23.6  0 - 100 mmol/L    Acid-base deficit 0.2  0.0 - 2.0 mmol/L    O2 Saturation 98.5      Patient temperature 98.6      Collection site RIGHT RADIAL      Drawn by [phone]      Sample type ARTERIAL      Allens test (pass/fail) PASS  PASS   CBC     Status: Abnormal   Collection Time   04/01/12  6:24 AM       Component Value Range Comment   WBC 24.3 (*) 4.0 - 10.5 K/uL    RBC 5.25  4.22 - 5.81 MIL/uL    Hemoglobin 15.5  13.0 - 17.0 g/dL    HCT 04.5  40.9 - 81.1 %    MCV 89.9  78.0 - 100.0 fL    MCH 29.5  26.0 - 34.0 pg    MCHC 32.8  30.0 - 36.0 g/dL    RDW 91.4 (*) 78.2 - 15.5 %    Platelets 341  150 - 400 K/uL   COMPREHENSIVE METABOLIC PANEL     Status: Abnormal   Collection Time   04/01/12  6:24 AM      Component Value Range Comment   Sodium 137  135 - 145 mEq/L    Potassium 3.6  3.5 - 5.1 mEq/L    Chloride 97  96 - 112 mEq/L    CO2 29  19 - 32 mEq/L    Glucose, Bld 122 (*) 70 - 99 mg/dL    BUN 11  6 - 23 mg/dL    Creatinine, Ser 1.61  0.50 - 1.35 mg/dL    Calcium 9.6  8.4 - 09.6 mg/dL    Total Protein 7.5  6.0 - 8.3 g/dL    Albumin 3.8  3.5 - 5.2 g/dL    AST 32  0 - 37 U/L    ALT 19  0 - 53 U/L    Alkaline Phosphatase 86  39 - 117 U/L    Total Bilirubin 0.5  0.3 - 1.2 mg/dL    GFR calc non Af Amer >90  >90 mL/min    GFR calc Af Amer >90  >90 mL/min   PRO B NATRIURETIC PEPTIDE     Status: Abnormal   Collection Time   04/01/12  6:24 AM      Component Value Range Comment   Pro B Natriuretic peptide (BNP) 686.8 (*) 0 - 125 pg/mL   TROPONIN I     Status: Normal   Collection Time   04/01/12  6:24 AM      Component Value Range Comment   Troponin I <0.30  <0.30 ng/mL     Radiological Exams on Admission: No results found.  Assessment/Plan Active Problems:   1. COPD exacerbation: Patient presented with progressive shortness of breath and wheeze, associated with a productive cough. Symptoms worsened since 03/31/12, and he has failed an ED visit. He re-presented on 04/01/12, and continues to be symptomatic with increased work of breathing, despite hour-long nebulizer treatment in ED. He will be admitted to SDU for close monitoring, and managed with Oxygen supplementation, frequent bronchodilator nebulizers, parenteral steroids, mucolytics and antibiotics.  2. Acute bronchitis:  This is the culprit for #1 above. Patient's cough is productive of yellow phlegm, and he has had subjective fever. CXR is devoid of pneumonic consolidation. We shall manage as described above. Levaquin has been commenced in the ED. We agree with this choice, and will continue.  3. Chronic hypercapnic respiratory failure: Patient has known chronic respiratory failure on nocturnal O2 at 2L/min. He now presents with acte-on-chronic type 2 respiratory failure based on ABG, due to #s 1&2 above. Managed as outlined above. May need prn Bipap. Will do D-Dimer, to exclude PE as a contributory/precipitating cause, given his left-sided chest pain. Cardiac enzymes will be cycled, although he has no history of CAD. 12-lead EKG shows sinus tachycardia.  4. History of Hodgkin's disease: This was diagnosed about 30 years ago, and appears to be in remission.  5. Tobacco abuse: Patient unfortunately, smokes about a pack of cigarettes per day, and has done so for over 30 years. Counseled and commenced on Nicoderm CQ patch.  6. ETOH abuse: Patient consumes a 6-pack/per week of bear. According to him, he last imbibed about a week ago. We shall commence vitamin supplements, and watch for withdrawal phenomena.  7. Severe protein-calorie malnutrition: Patient looks severely malnourished and even cachectic. Nutritionist consultation has been requested.   Further management will depend on clinical course.  Comment: Patient is FULL CODE.   Time Spent on Admission: 45 mins.  Daniqua Campoy,CHRISTOPHER 04/01/2012, 9:14 AM

## 2012-04-01 NOTE — Progress Notes (Signed)
ANTICOAGULATION CONSULT NOTE - Initial Consult  Pharmacy Consult for Heparin Indication: r/o NSTEMI and PE  Allergies  Allergen Reactions  . Codeine Hives and Nausea And Vomiting    Patient Measurements: Height: 5\' 7"  (170.2 cm) Weight: 100 lb 1.4 oz (45.4 kg) IBW/kg (Calculated) : 66.1   Vital Signs: Temp: 96.6 F (35.9 C) (11/24 1200) Temp src: Axillary (11/24 1200) BP: 163/135 mmHg (11/24 1600) Pulse Rate: 107  (11/24 1600)  Labs:  Basename 04/01/12 1530 04/01/12 1040 04/01/12 0624 03/30/12 2140  HGB -- -- 15.5 14.7  HCT -- -- 47.2 43.5  PLT -- -- 341 304  APTT -- -- -- --  LABPROT -- -- -- --  INR -- -- -- --  HEPARINUNFRC -- -- -- --  CREATININE -- -- 0.68 0.57  CKTOTAL -- -- -- --  CKMB -- -- -- --  TROPONINI 0.92* <0.30 <0.30 --    Estimated Creatinine Clearance: 67.8 ml/min (by C-G formula based on Cr of 0.68).   Medical History: Past Medical History  Diagnosis Date  . Thyroid disease     hyper, not on meds  . COPD (chronic obstructive pulmonary disease)   . Hodgkin's disease(201) 1993    with abdominal surgical scar -pt unable to describe   . Spontaneous pneumothorax     left  . Spontaneous pneumothorax     right  . Pneumonia   . Chronic respiratory failure   . Pneumonia     Medications:  Scheduled:    . sodium chloride   Intravenous STAT  . [COMPLETED] albuterol  15 mg Nebulization Once  . albuterol  2.5 mg Nebulization Q4H  . antiseptic oral rinse  15 mL Mouth Rinse BID  . aspirin  325 mg Oral Daily  . folic acid  1 mg Oral Daily  . guaiFENesin  600 mg Oral BID  . levofloxacin  750 mg Intravenous Q24H  . [COMPLETED] magnesium sulfate 1 - 4 g bolus IVPB  2 g Intravenous Once  . methylPREDNISolone (SOLU-MEDROL) injection  60 mg Intravenous Q6H  . [COMPLETED]  morphine injection  4 mg Intravenous Once  . nicotine  21 mg Transdermal Daily  . [COMPLETED] ondansetron  4 mg Intravenous Once  . sodium chloride  3 mL Intravenous Q12H  .  thiamine  100 mg Oral Daily  . tiotropium  18 mcg Inhalation Daily  . [DISCONTINUED] heparin  5,000 Units Subcutaneous Q8H  . [DISCONTINUED] magnesium sulfate  2 g Intravenous Once  . [DISCONTINUED] methylPREDNISolone (SOLU-MEDROL) injection  125 mg Intravenous Daily   Infusions:    Assessment:  56 YOM admit 11/24 with COPD exacerbation, bronchitis, and possible NSTEMI and/or PE.  IV heparin started per pharmacy dosing.  First prophylactic dose of heparin 5000 units SQ given today at 1500  D-dimer 0.49  Troponins:  <0.3, <0.3, 0.92  CBC wnl, baseline coags pending  Goal of Therapy:  Heparin level 0.3-0.7 units/ml Monitor platelets by anticoagulation protocol: Yes   Plan:   Baseline PTT, PT/INR  Give heparin 2500 units bolus IV x 1  Start heparin IV infusion at 550 units/hr  Heparin level 6 hours after starting  Daily heparin level and CBC  Continue to monitor H&H and platelets   Lynann Beaver PharmD, BCPS Pager [phone] 04/01/2012 5:10 PM

## 2012-04-01 NOTE — Consult Note (Signed)
Cardiology Consult Note  Admit date: 04/01/2012 Name: Philip Coleman 56 y.o.  male DOB:  December 16, 1955 MRN:  [id number]  Today's date:  04/01/2012  Referring Physician:    Dr. Ricke Hey  Reason for Consultation:  Abnormal troponin, chest pain  IMPRESSIONS: 1. Minimal elevation of troponin in a patient with a primary exacerbation of COPD and a diagnosis of atypical chest pain 2. Coronary artery disease with coronary calcifications and aortic calcifications as noted on CT scan 3. Severe COPD and emphysema oxygen dependent 4. Ongoing tobacco abuse 5. Remote history of treatment for Hodgkin's disease  RECOMMENDATION: The patient has chronic chest pain since his pleurodesis that was done surgically. He has advanced COPD and continues to smoke. He does have evidence of coronary artery disease on CT scan as evidenced by coronary calcification and is at risk for this because of his tobacco abuse as well as his previous treatment for Hodgkin's which most likely involved mediastinal radiation.  Likely the elevation of troponin is due to demand ischemia and I suspect his primary problem is severe COPD and exacerbation.   I would continue to treat his COPD exacerbation. I would empirically treat him for coronary artery disease including treatment with lipid-lowering therapy and  Aspirin. If his lung disease would allow would use beta blockers. His functional status is low. He also needs to discontinue smoking. Further cardiac workup will depend upon his clinical course and subsequent EKGs.   HISTORY: This 56 year-old male has advanced COPD and is currently oxygen dependent. He has smoked heavily for years and continues to smoke around one pack of cigarettes per day. He has had multiple admissions for COPD and is noncompliant with his therapy. He normally has no chest pain suggestive of angina. He was admitted with another exacerbation of COPD and dyspnea. Treated earlier this year for a lingual pneumonia.  He was admitted with left-sided chest pain as well as an exacerbation of COPD. The patient states he has had chronic left-sided chest pain that has been present since his pleurodesed this in May of this year. He does not have exertional tightness or heaviness suggestive of angina. He complains of nocturnal leg cramps but has no claudication.    Past Medical History  Diagnosis Date  . Thyroid disease     hyper, not on meds  . COPD (chronic obstructive pulmonary disease)   . Hodgkin's disease(201) 1993    with abdominal surgical scar -pt unable to describe   . Spontaneous pneumothorax     left  . Spontaneous pneumothorax     right  . Pneumonia   . Chronic respiratory failure   . Pneumonia       Past Surgical History  Procedure Date  . Splenectomy   . Chest tube insertion     left  . Chest tube insertion     right    Allergies:   is allergic to codeine.   Medications: Prior to Admission medications   Medication Sig Start Date End Date Taking? Authorizing Provider  albuterol (PROVENTIL HFA;VENTOLIN HFA) 108 (90 BASE) MCG/ACT inhaler Inhale 2 puffs into the lungs every 6 (six) hours as needed. wheezing   Yes Historical Provider, MD  azithromycin (ZITHROMAX) 250 MG tablet Take 1 tablet (250 mg total) by mouth daily. Take first 2 tablets together, then 1 every day until finished. 03/31/12  Yes Derwood Kaplan, MD  budesonide-formoterol (SYMBICORT) 160-4.5 MCG/ACT inhaler Inhale 2 puffs into the lungs 2 (two) times daily.   Yes Historical  Provider, MD  famotidine (PEPCID AC) 10 MG chewable tablet Chew 10 mg by mouth daily as needed. heartache   Yes Historical Provider, MD  HYDROcodone-acetaminophen (NORCO/VICODIN) 5-325 MG per tablet Take 1 tablet by mouth every 4 (four) hours as needed for pain. 03/31/12  Yes Derwood Kaplan, MD  ibuprofen (ADVIL,MOTRIN) 600 MG tablet Take 1 tablet (600 mg total) by mouth every 6 (six) hours as needed for pain. 03/31/12  Yes Derwood Kaplan, MD    loperamide (IMODIUM) 2 MG capsule Take 2 mg by mouth 4 (four) times daily as needed. For diarrhea   Yes Historical Provider, MD  predniSONE (DELTASONE) 50 MG tablet Take 1 tablet (50 mg total) by mouth daily. 03/31/12  Yes Derwood Kaplan, MD  tiotropium (SPIRIVA) 18 MCG inhalation capsule Place 1 capsule (18 mcg total) into inhaler and inhale daily. 12/26/11 12/25/12 Yes Estela Isaiah Blakes, MD   Family History: No family status information on file.   Social History:   reports that he has been smoking Cigarettes.  He has a 10.25 pack-year smoking history. He does not have any smokeless tobacco history on file. He reports that he drinks about 3.6 ounces of alcohol per week. He reports that he does not use illicit drugs.   Lives in a boarding house.   Review of Systems: He has chronic dyspnea wears oxygen at home. He has cramps in his legs at night. He has had frequent exacerbations of dyspnea as well as COPD. He has had chronic chest pain since his previous pleurodesed this. He has significant anxiety. He has a somewhat reduced appetite. Other than as noted above the remainder of the review of systems is unremarkable.  Physical Exam: Blood pressure 130/75, pulse 103, temperature 98.8 F (37.1 C), temperature source Oral, resp. rate 17, height 5\' 7"  (1.702 m), weight 45.4 kg (100 lb 1.4 oz), SpO2 95.00%.    General appearance: Thin, talkative white male appears cachectic in no acute distress Head: Normocephalic, without obvious abnormality, atraumatic Eyes: conjunctivae/corneas clear. PERRL, EOM's intact. Fundi not examined  Neck: no adenopathy, no carotid bruit, no JVD and supple, symmetrical, trachea midline Lungs: Rhonchi with diminished breath sounds, surgical scar present on left Heart: regular rate and rhythm, S1, S2 normal, no murmur, click, rub or gallop Abdomen: soft, non-tender; bowel sounds normal; no masses,  no organomegaly Rectal: deferred Extremities: extremities  normal, atraumatic, no cyanosis or edema Pulses: 2+ and symmetric Skin: Skin color, texture, turgor normal. No rashes or lesions Neurologic: Grossly normal  Labs: CBC  Basename 04/01/12 0624 03/30/12 2140  WBC 24.3* --  RBC 5.25 --  HGB 15.5 --  HCT 47.2 --  PLT 341 --  MCV 89.9 --  MCH 29.5 --  MCHC 32.8 --  RDW 15.9* --  LYMPHSABS -- 1.0  MONOABS -- 0.8  EOSABS -- 0.0  BASOSABS -- 0.0   CMP   Basename 04/01/12 0624  NA 137  K 3.6  CL 97  CO2 29  GLUCOSE 122*  BUN 11  CREATININE 0.68  CALCIUM 9.6  PROT 7.5  ALBUMIN 3.8  AST 32  ALT 19  ALKPHOS 86  BILITOT 0.5  GFRNONAA >90  GFRAA >90   BNP (last 3 results)  Basename 04/01/12 0624  PROBNP 686.8*   Cardiac Panel (last 3 results)  Basename 04/01/12 1823 04/01/12 1530 04/01/12 1040  CKTOTAL -- -- --  CKMB -- -- --  TROPONINI 0.98* 0.92* <0.30  RELINDX -- -- --     Radiology:  Emphysematous changes and fibrosis in the lungs. Developing perihilar infiltration.   EKG: Sinus tachycardia, baseline artifact, nonspecific ST and T-wave changes  Signed:  W. Ashley Royalty MD [facility]   Cardiology Consultant  04/01/2012, 11:29 PM

## 2012-04-02 DIAGNOSIS — R7989 Other specified abnormal findings of blood chemistry: Secondary | ICD-10-CM

## 2012-04-02 DIAGNOSIS — J9610 Chronic respiratory failure, unspecified whether with hypoxia or hypercapnia: Secondary | ICD-10-CM

## 2012-04-02 DIAGNOSIS — R0989 Other specified symptoms and signs involving the circulatory and respiratory systems: Secondary | ICD-10-CM

## 2012-04-02 DIAGNOSIS — R079 Chest pain, unspecified: Secondary | ICD-10-CM

## 2012-04-02 DIAGNOSIS — R0609 Other forms of dyspnea: Secondary | ICD-10-CM

## 2012-04-02 DIAGNOSIS — J441 Chronic obstructive pulmonary disease with (acute) exacerbation: Principal | ICD-10-CM

## 2012-04-02 LAB — COMPREHENSIVE METABOLIC PANEL
ALT: 31 U/L (ref 0–53)
AST: 51 U/L — ABNORMAL HIGH (ref 0–37)
Albumin: 2.9 g/dL — ABNORMAL LOW (ref 3.5–5.2)
Calcium: 8.9 mg/dL (ref 8.4–10.5)
Sodium: 133 mEq/L — ABNORMAL LOW (ref 135–145)
Total Protein: 6.2 g/dL (ref 6.0–8.3)

## 2012-04-02 LAB — CBC
HCT: 41 % (ref 39.0–52.0)
Hemoglobin: 13.2 g/dL (ref 13.0–17.0)
MCH: 29.1 pg (ref 26.0–34.0)
MCHC: 32.2 g/dL (ref 30.0–36.0)
RBC: 4.54 MIL/uL (ref 4.22–5.81)

## 2012-04-02 LAB — TROPONIN I: Troponin I: 0.67 ng/mL (ref <0.30)

## 2012-04-02 LAB — LIPID PANEL
LDL Cholesterol: 62 mg/dL (ref 0–99)
Total CHOL/HDL Ratio: 2 RATIO

## 2012-04-02 LAB — HEPARIN LEVEL (UNFRACTIONATED)
Heparin Unfractionated: 0.21 IU/mL — ABNORMAL LOW (ref 0.30–0.70)
Heparin Unfractionated: 0.16 IU/mL — ABNORMAL LOW (ref 0.30–0.70)

## 2012-04-02 LAB — GLUCOSE, CAPILLARY: Glucose-Capillary: 126 mg/dL — ABNORMAL HIGH (ref 70–99)

## 2012-04-02 MED ORDER — HEPARIN (PORCINE) IN NACL 100-0.45 UNIT/ML-% IJ SOLN
650.0000 [IU]/h | INTRAMUSCULAR | Status: DC
  Administered 2012-04-02: 650 [IU]/h via INTRAVENOUS
  Filled 2012-04-02: qty 250

## 2012-04-02 MED ORDER — HEPARIN SODIUM (PORCINE) 5000 UNIT/ML IJ SOLN
5000.0000 [IU] | Freq: Three times a day (TID) | INTRAMUSCULAR | Status: DC
  Administered 2012-04-02 – 2012-04-03 (×4): 5000 [IU] via SUBCUTANEOUS
  Filled 2012-04-02 (×6): qty 1

## 2012-04-02 MED ORDER — ALBUTEROL SULFATE (5 MG/ML) 0.5% IN NEBU
2.5000 mg | INHALATION_SOLUTION | Freq: Four times a day (QID) | RESPIRATORY_TRACT | Status: DC
  Administered 2012-04-02 – 2012-04-03 (×6): 2.5 mg via RESPIRATORY_TRACT
  Filled 2012-04-02 (×5): qty 0.5

## 2012-04-02 MED ORDER — LEVOFLOXACIN 500 MG PO TABS
500.0000 mg | ORAL_TABLET | Freq: Every day | ORAL | Status: DC
  Administered 2012-04-03: 500 mg via ORAL
  Filled 2012-04-02: qty 1

## 2012-04-02 NOTE — Progress Notes (Signed)
[facility] PROGRESS NOTE  SHAMEER MUSIC EXB:[id number] DOB: August 13, 1955 DOA: 04/01/2012 PCP: Provider Not In System  Assessment/Plan: Active Problems:  COPD exacerbation  Acute bronchitis  Chronic hypercapnic respiratory failure  History of Hodgkin's disease  Elevated troponin    1. COPD exacerbation: Patient presented with progressive shortness of breath and wheeze, associated with a productive cough. Symptoms worsened since 03/31/12, and he failed an ED visit on that date. He re-presented on 04/01/12, continued symptoms and increased work of breathing, despite hour-long nebulizer treatment in ED. Admitted to SDU for close monitoring, and managed with Oxygen supplementation, frequent bronchodilator nebulizers, parenteral steroids, mucolytics and antibiotics. As of afternoon of 04/01/12, patient had visibly improve, with decreased work of breathing and was able to talk in complete sentences. This AM he is clinically, even better. Pulmonary follow up will be necessary, on discharge.  2. Acute bronchitis: This is the culprit for #1 above. Patient's cough is productive of yellow phlegm, and he has had subjective fever. CXR is devoid of pneumonic consolidation. Managing as described above. Now on day#2 Levaquin. He is apyrexial and wcc which was 24.3 at presentation, is trending down, and is 16.1 today.  3. Chronic hypercapnic respiratory failure: Patient has known chronic respiratory failure on nocturnal O2 at 2L/min. He now presents with acute-on-chronic type 2 respiratory failure based on ABG, due to #s 1&2 above. Managed as outlined above. Fortunately, he has managed to avoid Bipap. D-Dimer was elevated at 0.49, but fortunately, CTA showed no evidence of PE. 4. Chest pain: Patient has chronic left-sided chest pain, which has been present since his VATS procedure, but appeared exacerbated at the time of admission. As described above, CTA was negative for PE, but patient did have a"bump" in  cardiac enzymes on 04/01/12, 12-lead EKG showed no acute ischemic changes. CTA did reveal coronary calcifications, and patient is a heavy smoker. He was started on iv Heparin infusion, and Dr [name] provided cardiology consultation. Troponin peaked at 1.07, and is already trending down. Dr [name] has sen patient today, and cardiology opinion is that this is demand ischemia. IV Heparin has been discontinued, and 2D echocardiogram is pending. Patient will follow up with cardiologist on discharge. Low dose ASA will be continued. Patient is not as suitable candidate for beta-blockade, given severe COPD.  5. History of Hodgkin's disease: This was diagnosed about 30 years ago, and appears to be in remission.  6. Tobacco abuse: Patient unfortunately, smokes about a pack of cigarettes per day, and has done so for over 30 years. Counseled and commenced on Nicoderm CQ patch.  7. ETOH abuse: Patient consumes a 6-pack/per week of beer. According to him, he last imbibed about a week ago. On vitamin supplements, and no withdrawal phenomena so far.  8. Severe protein-calorie malnutrition: Patient looks severely malnourished and even cachectic. Nutritionist consultation has been requested.      Code Status: Full Code.  Family Communication:  Disposition Plan: Stable for transfer to telemetry floor today.     Brief narrative: This is a 56 year old male, with known history of tobacco abuse, ETOH abuse, severe protein-calorie malnutrition, Hodgkin's disease diagnosed about 30 years ago, treated with radiation, COPD/chronic respiratory failure on nocturnal O2 at 2L/min, recurrent spontaneous left s/p left VATS 09/22/11 by Dr [name], for stapling of blebs and mechanical pleurodesis. Patient was hospitalized 01/20/12-01/23/12, for lingular pneumonia, and according to him, he has felt unwell, since. Shortness of breath has been progressive, but on ii/23/13, he became acutely  short of breath, with left-sided chest  pain, wheeze, and a cough, productive of yellow phlegm, associated with subjective fever, butt no chills. He was seen in the ED on that date, and was discharged after bronchodilator treatment. He returns because of persistent/worsening symptoms, had avery restless night, and early AM 04/01/12, called EMS. ED MD administered iv Solumedrol and completed an hour-long nebulizer, with only limited improvement. Patient was admitted for further management.   Consultants:  Dr [name], cardiologist.   Procedures:  CXR  CTA.  2D echocardiogram.   Antibiotics:  Levaquin 04/01/12>>>  HPI/Subjective: Feels better.   Objective: Vital signs in last 24 hours: Temp:  [96.6 F (35.9 C)-98.8 F (37.1 C)] 98.1 F (36.7 C) (11/25 0400) Pulse Rate:  [82-107] 97  (11/25 0700) Resp:  [12-25] 18  (11/25 0700) BP: (107-163)/(60-135) 140/70 mmHg (11/25 0700) SpO2:  [71 %-98 %] 96 % (11/25 0700) FiO2 (%):  [50 %] 50 % (11/25 0700) Weight:  [45.4 kg (100 lb 1.4 oz)-48.7 kg (107 lb 5.8 oz)] 48.7 kg (107 lb 5.8 oz) (11/25 0400) Weight change:     Intake/Output from previous day: 11/24 0701 - 11/25 0700 In: 2895.5 [P.O.:1560; I.V.:1035.5; IV Piggyback:100] Out: 1125 [Urine:1125]     Physical Exam: General: Comfortable today, not short of breath at rest, talking in complete sentences. Fully oriented. Appears cachectic.  HEENT: No clinical pallor, no jaundice, no conjunctival injection or discharge. Hydration status is satisfactory.  NECK: Supple, JVP not seen, no carotid bruits, no palpable lymphadenopathy, no palpable goiter.  CHEST: Breath sounds are quiet bilaterally, only few rhonchi, no crackles.  HEART: Sounds 1 and 2 heard, normal, regular, no murmurs.  ABDOMEN: Flat, soft, has a mid-line laparotomy scar, and radiotherapy markers. On abdominal wall. Non-tender, no palpable organomegaly, no palpable masses, normal bowel sounds.  GENITALIA: Not examined.  LOWER EXTREMITIES: No pitting  edema, palpable peripheral pulses.  MUSCULOSKELETAL SYSTEM: Unremarkable.  CENTRAL NERVOUS SYSTEM: No focal neurologic deficit on gross examination.  Lab Results:  Basename 04/02/12 0520 04/01/12 0624  WBC 16.1* 24.3*  HGB 13.2 15.5  HCT 41.0 47.2  PLT 315 341    Basename 04/02/12 0520 04/01/12 0624  NA 133* 137  K 3.9 3.6  CL 98 97  CO2 29 29  GLUCOSE 145* 122*  BUN 11 11  CREATININE 0.55 0.68  CALCIUM 8.9 9.6   Recent Results (from the past 240 hour(s))  MRSA PCR SCREENING     Status: Abnormal   Collection Time   04/01/12  9:53 AM      Component Value Range Status Comment   MRSA by PCR INVALID RESULTS, SPECIMEN SENT FOR CULTURE (*) NEGATIVE Final      Studies/Results: Ct Angio Chest Pe W/cm &/or Wo Cm  04/01/2012  *RADIOLOGY REPORT*  Clinical Data: Dyspnea, pleuritic chest pain, cough, elevated D- dimer and elevated troponin levels.  CT ANGIOGRAPHY CHEST  Technique:  Multidetector CT imaging of the chest using the standard protocol during bolus administration of intravenous contrast. Multiplanar reconstructed images including MIPs were obtained and reviewed to evaluate the vascular anatomy.  Contrast: OMNIPAQUE IOHEXOL 350 MG/ML SOLN  Comparison: Chest x-ray earlier today as well as prior CTA of the chest on 12/24/2011.  Findings: Although pulmonary arterial opacification is somewhat suboptimal on today's study, there is no convincing evidence of pulmonary embolism.  There is a stable appearance to advanced emphysematous lung disease with stable prominent pleural thickening and scarring at the right lung apex.  No focal  airspace infiltrate, pneumothorax or pulmonary nodules are identified.  No enlarged lymph nodes identified.  The heart size is normal.  Stable atherosclerotic disease of the aortic arch without evidence of aortic aneurysm.  The bony thorax is unremarkable.  IMPRESSION: No evidence of pulmonary embolism.  Stable advanced emphysematous lung disease without  evidence of focal infiltrate.   Original Report Authenticated By: Irish Lack, M.D.    Dg Chest Portable 1 View  04/01/2012  *RADIOLOGY REPORT*  Clinical Data: Respiratory distress.  PORTABLE CHEST - 1 VIEW  Comparison: 03/30/2012  Findings: Normal heart size and pulmonary vascularity.  Bullous emphysematous changes in the lungs with scattered interstitial fibrosis.  There is increasing perihilar infiltration in the lungs predominately on the left.  This may represent developing pneumonia or edema.  No blunting of costophrenic angles.  No pneumothorax. Old left rib fractures.  IMPRESSION: Emphysematous changes and fibrosis in the lungs.  Developing perihilar infiltration.   Original Report Authenticated By: Burman Nieves, M.D.     Medications: Scheduled Meds:   . sodium chloride   Intravenous STAT  . albuterol  2.5 mg Nebulization Q4H  . antiseptic oral rinse  15 mL Mouth Rinse BID  . aspirin  325 mg Oral Daily  . atorvastatin  40 mg Oral q1800  . folic acid  1 mg Oral Daily  . guaiFENesin  600 mg Oral BID  . [COMPLETED] heparin  2,500 Units Intravenous Once  . levofloxacin  750 mg Intravenous Q24H  . methylPREDNISolone (SOLU-MEDROL) injection  60 mg Intravenous Q6H  . nicotine  21 mg Transdermal Daily  . sodium chloride  3 mL Intravenous Q12H  . thiamine  100 mg Oral Daily  . tiotropium  18 mcg Inhalation Daily  . [DISCONTINUED] heparin  5,000 Units Subcutaneous Q8H  . [DISCONTINUED] methylPREDNISolone (SOLU-MEDROL) injection  125 mg Intravenous Daily   Continuous Infusions:   . heparin 650 Units/hr (04/02/12 0300)  . [DISCONTINUED] heparin 550 Units/hr (04/01/12 1842)   PRN Meds:.sodium chloride, albuterol, famotidine, HYDROcodone-acetaminophen, ibuprofen, [COMPLETED] iohexol, morphine injection, ondansetron (ZOFRAN) IV, ondansetron, sodium chloride, [DISCONTINUED] famotidine    LOS: 1 day   Mirjana Tarleton,CHRISTOPHER  [facility] Pager [phone]. If 8PM-8AM, please contact  night-coverage at [email or web address], password [email or web address] 04/02/2012, 8:29 AM  LOS: 1 day

## 2012-04-02 NOTE — Discharge Summary (Addendum)
Physician Discharge Summary  Philip Coleman EAV:[id number] DOB: 1956/04/26 DOA: 04/01/2012  PCP: Provider Not In System  Admit date: 04/01/2012 Discharge date: 04/03/2012  Time spent: 40 minutes  Recommendations for Outpatient Follow-up:  1. Follow up with PMD. 2. Follow up with [facility]. 3. Follow up with primary pulmonologist at [facility]. 4. Follow up with [place] [facility] [place] pulmonary PA on 112/2/13 and with Dr [name] on 04/19/12. Philip Coleman   Discharge Diagnoses:  Active Problems:  COPD exacerbation  Acute bronchitis  Chronic hypercapnic respiratory failure  History of Hodgkin's disease  Elevated troponin   Discharge Condition: Satisfactory.  Diet recommendation: Regular.   Filed Weights   04/01/12 1200 04/02/12 0400  Weight: 45.4 kg (100 lb 1.4 oz) 48.7 kg (107 lb 5.8 oz)    History of present illness:  This is a 56 year old male, with known history of tobacco abuse, ETOH abuse, severe protein-calorie malnutrition, Hodgkin's disease diagnosed about 30 years ago, treated with radiation, COPD/chronic respiratory failure on nocturnal O2 at 2L/min, recurrent spontaneous left s/p left VATS 09/22/11 by Dr [name], for stapling of blebs and mechanical pleurodesis. Patient was hospitalized 01/20/12-01/23/12, for lingular pneumonia, and according to him, he has felt unwell, since. Shortness of breath has been progressive, but on 03/31/12, he became acutely short of breath, with left-sided chest pain, wheeze, and a cough, productive of yellow phlegm, associated with subjective fever, butt no chills. He was seen in the ED on that date, and was discharged after bronchodilator treatment. He returns because of persistent/worsening symptoms, had avery restless night, and early AM 04/01/12, called EMS. ED MD administered iv Solumedrol and completed an hour-long nebulizer, with only limited improvement. Patient was admitted for further management.   Hospital Course:  1. COPD  exacerbation: Patient presented with progressive shortness of breath and wheeze, associated with a productive cough. Symptoms worsened on 03/31/12, and he failed an ED visit on that date. He re-presented on 04/01/12, with continued symptoms and increased work of breathing, despite hour-long nebulizer treatment in ED. He was therefore, admitted to SDU for close monitoring, and managed with oxygen supplementation, frequent bronchodilator nebulizers, parenteral steroids, mucolytics and antibiotics. As of afternoon of 04/01/12, patient had visibly improved, with decreased work of breathing and was able to talk in complete sentences. By AM of 04/02/12, he was clinically, even better and continued to improve thereafter. Pulmonary follow up will be necessary, on discharge. Dr [name] provided pulmonary consultation and has recommended home on his Spiriva and symbicort, as well as his rescue SABA  Follow up has been arranged for 04/09/12 at 9:30 AM.  2. Acute bronchitis: This was the culprit for #1 above. Patient's cough was productive of yellow phlegm, and he has had subjective fever. CXR was devoid of pneumonic consolidation. Managed as described above. Levaquin was commenced on 04/01/12, he remained apyrexial and wcc which was 24.3 at presentation, trended down nicely.  3. Chronic hypercapnic respiratory failure: Patient has known chronic respiratory failure on home nocturnal O2 at 2L/min. He now presents with acute-on-chronic type 2 respiratory failure based on ABG, due to #s 1&2 above. Managed as outlined above. Fortunately, he managed to avoid Bipap. D-Dimer was elevated at 0.49, but CTA showed no evidence of PE. Oxygen requirements improved with treatment, and after a brief period of Ventimask, he was successfully weaned down to 2L/min via [place], and saturations remained at 95%-99%. 4. Chest pain: Patient has chronic left-sided chest pain, which has been present since his VATS procedure, but  appeared exacerbated  at the time of admission. As described above, CTA was negative for PE, but patient did have a"bump" in cardiac enzymes on 04/01/12, 12-lead EKG showed no acute ischemic changes. CTA did reveal coronary calcifications, and patient is a heavy smoker. He was started on iv Heparin infusion, and Dr [name] provided cardiology consultation. Troponin peaked at 1.07, and then trended down. Dr [name] saw patient on 04/02/12, and cardiology opinion is that this is demand ischemia. IV Heparin was discontinued, accordingly, without deleterious effect. 2D echocardiogram showed normal LV cavity size, EF 55% and no regional wall motion abnormalities. Low dose ASA and Statin were continued. Patient is not a suitable candidate for beta-blockade, given severe COPD. He will follow up with [facility] on discharge. 5. History of Hodgkin's disease: This was diagnosed about 30 years ago, and appears to be in remission.  6. Tobacco abuse: Patient unfortunately, smokes about a pack of cigarettes per day, and has done so for over 30 years. Counseled and managed with Nicoderm CQ patch.  7. ETOH abuse: Patient consumes a 6-pack/per week of beer. According to him, he last imbibed about a week prior to admission. He was placed on vitamin supplements, and monitored with CIWA protocol, but showed no features of alcohol withdrawal, during his hospitalization.  8. Severe protein-calorie malnutrition: Patient appeared severely malnourished and even cachectic. Nutritionist consultation was requested, and recommendations implemented.       Procedures:  See below.  2D Echocardiogram.   Consultations:  Dr [name], cardiologist.   Discharge Exam: Filed Vitals:   04/03/12 0232 04/03/12 0610 04/03/12 0841 04/03/12 1426  BP:  156/91  144/82  Pulse:  84  102  Temp:  98.4 F (36.9 C)  99 F (37.2 C)  TempSrc:  Oral  Oral  Resp:  16  18  Height:      Weight:      SpO2: 98% 97% 96% 98%    General:  Comfortable today, not short of breath at rest, talking in complete sentences. Fully oriented. Appears cachectic.  HEENT: No clinical pallor, no jaundice, no conjunctival injection or discharge. Hydration status is satisfactory.  NECK: Supple, JVP not seen, no carotid bruits, no palpable lymphadenopathy, no palpable goiter.  CHEST: Breath sounds are quiet bilaterally, only few rhonchi, no crackles.  HEART: Sounds 1 and 2 heard, normal, regular, no murmurs.  ABDOMEN: Flat, soft, has a mid-line laparotomy scar, and radiotherapy markers. On abdominal wall. Non-tender, no palpable organomegaly, no palpable masses, normal bowel sounds.  GENITALIA: Not examined.  LOWER EXTREMITIES: No pitting edema, palpable peripheral pulses.  MUSCULOSKELETAL SYSTEM: Unremarkable.  CENTRAL NERVOUS SYSTEM: No focal neurologic deficit on gross examination.  Discharge Instructions      Discharge Orders    Future Appointments: Provider: Department: Dept Phone: Center:   04/09/2012 9:30 AM Julio Sicks, NP [facility] [phone] None   04/19/2012 4:15 PM [name], MD [facility] [phone] None     Future Orders Please Complete By Expires   Diet - low sodium heart healthy      Increase activity slowly          Medication List     As of 04/03/2012  3:45 PM    STOP taking these medications         azithromycin 250 MG tablet   Commonly known as: ZITHROMAX      TAKE these medications         albuterol 108 (90 BASE) MCG/ACT  inhaler   Commonly known as: PROVENTIL HFA;VENTOLIN HFA   Inhale 2 puffs into the lungs every 6 (six) hours as needed. wheezing      aspirin 325 MG tablet   Take 1 tablet (325 mg total) by mouth daily.      atorvastatin 40 MG tablet   Commonly known as: LIPITOR   Take 1 tablet (40 mg total) by mouth daily at 6 PM.      budesonide-formoterol 160-4.5 MCG/ACT inhaler   Commonly known as: SYMBICORT   Inhale 2 puffs into the lungs 2 (two) times daily.        famotidine 10 MG chewable tablet   Commonly known as: PEPCID AC   Chew 10 mg by mouth daily as needed. heartache      folic acid 1 MG tablet   Commonly known as: FOLVITE   Take 1 tablet (1 mg total) by mouth daily.      guaiFENesin 600 MG 12 hr tablet   Commonly known as: MUCINEX   Take 1 tablet (600 mg total) by mouth 2 (two) times daily.      HYDROcodone-acetaminophen 5-325 MG per tablet   Commonly known as: NORCO/VICODIN   Take 1 tablet by mouth every 4 (four) hours as needed.      ibuprofen 600 MG tablet   Commonly known as: ADVIL,MOTRIN   Take 1 tablet (600 mg total) by mouth every 6 (six) hours as needed for pain.      levofloxacin 500 MG tablet   Commonly known as: LEVAQUIN   Take 1 tablet (500 mg total) by mouth daily.      loperamide 2 MG capsule   Commonly known as: IMODIUM   Take 2 mg by mouth 4 (four) times daily as needed. For diarrhea      predniSONE 10 MG tablet   Commonly known as: DELTASONE   Take 40 mg daily for 3 days, then 30 mg daily for 3 days, then 20 mg daily for 3 days, then 10 mg daily for 3 days, then stop.      thiamine 100 MG tablet   Take 1 tablet (100 mg total) by mouth daily.      tiotropium 18 MCG inhalation capsule   Commonly known as: SPIRIVA   Place 1 capsule (18 mcg total) into inhaler and inhale daily.         Follow-up Information    Follow up with PARRETT,TAMMY, NP. On 04/09/2012. (930 am )    Contact information:   [facility] [address] [place] [address] [phone]       Follow up with SOOD,VINEET, MD. On 04/19/2012. (at 415)    Contact information:   [address] [facility] [place] [address] [phone]       Follow up with [facility]. Call in 1 week.   Contact information:   [address] [place] [address] [phone]          The results of significant diagnostics from this hospitalization (including imaging,  microbiology, ancillary and laboratory) are listed below for reference.    Significant Diagnostic Studies: Ct Angio Chest Pe W/cm &/or Wo Cm  04/01/2012  *RADIOLOGY REPORT*  Clinical Data: Dyspnea, pleuritic chest pain, cough, elevated D- dimer and elevated troponin levels.  CT ANGIOGRAPHY CHEST  Technique:  Multidetector CT imaging of the chest using the standard protocol during bolus administration of intravenous contrast. Multiplanar reconstructed images including MIPs were  obtained and reviewed to evaluate the vascular anatomy.  Contrast: OMNIPAQUE IOHEXOL 350 MG/ML SOLN  Comparison: Chest x-ray earlier today as well as prior CTA of the chest on 12/24/2011.  Findings: Although pulmonary arterial opacification is somewhat suboptimal on today's study, there is no convincing evidence of pulmonary embolism.  There is a stable appearance to advanced emphysematous lung disease with stable prominent pleural thickening and scarring at the right lung apex.  No focal airspace infiltrate, pneumothorax or pulmonary nodules are identified.  No enlarged lymph nodes identified.  The heart size is normal.  Stable atherosclerotic disease of the aortic arch without evidence of aortic aneurysm.  The bony thorax is unremarkable.  IMPRESSION: No evidence of pulmonary embolism.  Stable advanced emphysematous lung disease without evidence of focal infiltrate.   Original Report Authenticated By: Irish Lack, M.D.    Dg Chest Portable 1 View  04/01/2012  *RADIOLOGY REPORT*  Clinical Data: Respiratory distress.  PORTABLE CHEST - 1 VIEW  Comparison: 03/30/2012  Findings: Normal heart size and pulmonary vascularity.  Bullous emphysematous changes in the lungs with scattered interstitial fibrosis.  There is increasing perihilar infiltration in the lungs predominately on the left.  This may represent developing pneumonia or edema.  No blunting of costophrenic angles.  No pneumothorax. Old left rib fractures.  IMPRESSION:  Emphysematous changes and fibrosis in the lungs.  Developing perihilar infiltration.   Original Report Authenticated By: Burman Nieves, M.D.    Dg Chest Portable 1 View  03/30/2012  *RADIOLOGY REPORT*  Clinical Data: Shortness of breath and history of pneumonia.  PORTABLE CHEST - 1 VIEW  Comparison: 01/20/2012  Findings: Numerous leads and wires project over the chest.  Remote left rib fractures.  Surgical clips in the left upper quadrant. Normal heart size.  Age advanced aortic atherosclerosis.  Trace right pleural fluid or thickening, chronic. No pneumothorax. Bullous emphysema.  Chronic interstitial thickening.  Resolved lingular airspace disease.  No acute lobar consolidation.  IMPRESSION: Bullous emphysema and chronic bronchitis.  Resolved lingular airspace disease.  No definite acute pneumonia identified.  Trace right pleural fluid thickening.   Original Report Authenticated By: Jeronimo Greaves, M.D.     Microbiology: Recent Results (from the past 240 hour(s))  MRSA PCR SCREENING     Status: Abnormal   Collection Time   04/01/12  9:53 AM      Component Value Range Status Comment   MRSA by PCR INVALID RESULTS, SPECIMEN SENT FOR CULTURE (*) NEGATIVE Final   MRSA CULTURE     Status: Normal (Preliminary result)   Collection Time   04/01/12  9:53 AM      Component Value Range Status Comment   Specimen Description NOSE   Final    Special Requests NONE   Final    Culture NO SUSPICIOUS COLONIES, CONTINUING TO HOLD   Final    Report Status PENDING   Incomplete      Labs: Basic Metabolic Panel:  Lab 04/03/12 2130 04/02/12 0520 04/01/12 0624 03/30/12 2140  NA 138 133* 137 136  K 3.8 3.9 3.6 3.2*  CL 100 98 97 98  CO2 32 29 29 26   GLUCOSE 130* 145* 122* 115*  BUN 15 11 11 8   CREATININE 0.57 0.55 0.68 0.57  CALCIUM 9.0 8.9 9.6 9.0  MG -- -- -- --  PHOS -- -- -- --   Liver Function Tests:  Lab 04/02/12 0520 04/01/12 0624 03/30/12 2140  AST 51* 32 19  ALT 31 19 12   ALKPHOS  80 86 80    BILITOT 0.3 0.5 0.5  PROT 6.2 7.5 6.7  ALBUMIN 2.9* 3.8 3.5   No results found for this basename: LIPASE:5,AMYLASE:5 in the last 168 hours No results found for this basename: AMMONIA:5 in the last 168 hours CBC:  Lab 04/03/12 0502 04/02/12 0520 04/01/12 0624 03/30/12 2140  WBC 15.0* 16.1* 24.3* 18.2*  NEUTROABS -- -- -- 16.4*  HGB 13.2 13.2 15.5 14.7  HCT 39.7 41.0 47.2 43.5  MCV 90.4 90.3 89.9 87.7  PLT 332 315 341 304   Cardiac Enzymes:  Lab 04/02/12 0520 04/01/12 2258 04/01/12 1823 04/01/12 1530 04/01/12 1040  CKTOTAL -- -- -- -- --  CKMB -- -- -- -- --  CKMBINDEX -- -- -- -- --  TROPONINI 0.67* 1.07* 0.98* 0.92* <0.30   BNP: BNP (last 3 results)  Basename 04/01/12 0624  PROBNP 686.8*   CBG:  Lab 04/03/12 0739 04/02/12 0834  GLUCAP 126* 126*       Signed:  Noelle Hoogland,CHRISTOPHER  [facility] 04/03/2012, 3:45 PM

## 2012-04-02 NOTE — Progress Notes (Signed)
INITIAL ADULT NUTRITION ASSESSMENT Date: 04/02/2012   Time: 4:27 PM Reason for Assessment: consult  INTERVENTION: 1.  Supplements; Ensure Complete TID 2.  Nutrition-related medication; question whether pt's thyroid may be contributing to inability to gain wt although there are several other factors in pt's hx/lifestyle currently contributing as well.   DOCUMENTATION CODES Per approved criteria  -Severe malnutrition in the context of chronic illness    ASSESSMENT: Male 56 y.o.  Dx: COPD exacerbation  Hx:  Past Medical History  Diagnosis Date  . Thyroid disease     hyper, not on meds  . COPD (chronic obstructive pulmonary disease)   . Hodgkin's disease(201) 1993    with abdominal surgical scar -pt unable to describe   . Spontaneous pneumothorax     left  . Spontaneous pneumothorax     right  . Pneumonia   . Chronic respiratory failure   . Pneumonia    Past Surgical History  Procedure Date  . Splenectomy   . Chest tube insertion     left  . Chest tube insertion     right    Related Meds:  Scheduled Meds:   . [EXPIRED] sodium chloride   Intravenous STAT  . albuterol  2.5 mg Nebulization Q6H  . antiseptic oral rinse  15 mL Mouth Rinse BID  . aspirin  325 mg Oral Daily  . atorvastatin  40 mg Oral q1800  . folic acid  1 mg Oral Daily  . guaiFENesin  600 mg Oral BID  . [COMPLETED] heparin  2,500 Units Intravenous Once  . heparin subcutaneous  5,000 Units Subcutaneous Q8H  . [COMPLETED] levofloxacin  750 mg Intravenous Q24H  . levofloxacin  500 mg Oral Daily  . methylPREDNISolone (SOLU-MEDROL) injection  60 mg Intravenous Q6H  . nicotine  21 mg Transdermal Daily  . sodium chloride  3 mL Intravenous Q12H  . thiamine  100 mg Oral Daily  . tiotropium  18 mcg Inhalation Daily  . [DISCONTINUED] albuterol  2.5 mg Nebulization Q4H  . [DISCONTINUED] heparin  5,000 Units Subcutaneous Q8H   Continuous Infusions:   . [DISCONTINUED] heparin 550 Units/hr (04/01/12 1842)   . [DISCONTINUED] heparin 650 Units/hr (04/02/12 0300)   PRN Meds:.sodium chloride, albuterol, famotidine, HYDROcodone-acetaminophen, ibuprofen, [COMPLETED] iohexol, morphine injection, ondansetron (ZOFRAN) IV, ondansetron, sodium chloride   Ht: 5\' 7"  (170.2 cm)  Wt: 107 lb 5.8 oz (48.7 kg)  Ideal Wt: 64.5 kg % Ideal Wt: 75%  Usual Wt: 120 lbs % Usual Wt: 89%  Body mass index is 16.82 kg/(m^2).  Food/Nutrition Related Hx: cachetic appearance  Labs:  CMP     Component Value Date/Time   NA 133* 04/02/2012 0520   K 3.9 04/02/2012 0520   CL 98 04/02/2012 0520   CO2 29 04/02/2012 0520   GLUCOSE 145* 04/02/2012 0520   BUN 11 04/02/2012 0520   CREATININE 0.55 04/02/2012 0520   CALCIUM 8.9 04/02/2012 0520   PROT 6.2 04/02/2012 0520   ALBUMIN 2.9* 04/02/2012 0520   AST 51* 04/02/2012 0520   ALT 31 04/02/2012 0520   ALKPHOS 80 04/02/2012 0520   BILITOT 0.3 04/02/2012 0520   GFRNONAA >90 04/02/2012 0520   GFRAA >90 04/02/2012 0520    CBC    Component Value Date/Time   WBC 16.1* 04/02/2012 0520   RBC 4.54 04/02/2012 0520   HGB 13.2 04/02/2012 0520   HCT 41.0 04/02/2012 0520   PLT 315 04/02/2012 0520   MCV 90.3 04/02/2012 0520   MCH 29.1 04/02/2012  0520   MCHC 32.2 04/02/2012 0520   RDW 15.9* 04/02/2012 0520   LYMPHSABS 1.0 03/30/2012 2140   MONOABS 0.8 03/30/2012 2140   EOSABS 0.0 03/30/2012 2140   BASOSABS 0.0 03/30/2012 2140    Intake: 75% Output:   Intake/Output Summary (Last 24 hours) at 04/02/12 1631 Last data filed at 04/02/12 1100  Gross per 24 hour  Intake   2282 ml  Output   1550 ml  Net    732 ml   No stool since admission  Diet Order: General  Supplements/Tube Feeding: thiamine, folvite  IVF:    [DISCONTINUED] heparin Last Rate: 550 Units/hr (04/01/12 1842)  [DISCONTINUED] heparin Last Rate: 650 Units/hr (04/02/12 0300)    Estimated Nutritional Needs:   Kcal: 1900-2150 Protein: 85-94g Fluid: >1.5 L/day  Pt sleeping soundly at time  of visit, does not awaken to voice x2.   Pt know to this RD from previous recent admission.  Wt change between assessment is +4 lbs.   At previous admission, pt had reported good appetite with poor intake r/t pain control.  In addition pt with increased needs r/t energy expenditure with COPD and possible hyperthyroidism without medication. Pt previously denied limited access to food.  Pt diagnosed with severe malnutrition of chronic illnes due to 14% wt loss in 3 months, pt with wasting at clavicles and temples, and pt meeting <75% of estimated needs on averaged due to inability to eat.  This is ongoing related to pt's inability to gain wt (now 10.8% wt change overall in 6 months), with ongoing wasting at clavicles and temples.  NUTRITION DIAGNOSIS: -Inadequate oral intake (NI-2.1).  Status: Ongoing  RELATED TO: possible pain control, ongoing smoking and EtOH use  AS EVIDENCE BY: pt report on admission  MONITORING/EVALUATION(Goals): 1.  Food/Beverage; continued intake with supplementation  EDUCATION NEEDS: -Education not appropriate at this time  Loyce Dys, MS RD LDN Clinical Inpatient Dietitian Pager: [phone] Weekend/After hours pager: [phone]

## 2012-04-02 NOTE — Progress Notes (Signed)
PROGRESS NOTE  Subjective:   Philip Coleman is a 56 yo with hx of severe, O2 dependant COPD.  He was admitted with COPD exacerbation and chronic CP ( present since pleurodesis last May).  Troponins are mildly positive.  He continues to smoke.  He has no complaints.  Objective:    Vital Signs:   Temp:  [96.6 F (35.9 C)-98.8 F (37.1 C)] 98.1 F (36.7 C) (11/25 0400) Pulse Rate:  [82-117] 97  (11/25 0700) Resp:  [12-25] 18  (11/25 0700) BP: (107-163)/(60-135) 140/70 mmHg (11/25 0700) SpO2:  [71 %-98 %] 96 % (11/25 0700) FiO2 (%):  [50 %] 50 % (11/25 0700) Weight:  [100 lb 1.4 oz (45.4 kg)-107 lb 5.8 oz (48.7 kg)] 107 lb 5.8 oz (48.7 kg) (11/25 0400)      24-hour weight change: Weight change:   Weight trends: Filed Weights   04/01/12 1200 04/02/12 0400  Weight: 100 lb 1.4 oz (45.4 kg) 107 lb 5.8 oz (48.7 kg)    Intake/Output:  11/24 0701 - 11/25 0700 In: 2895.5 [P.O.:1560; I.V.:1035.5; IV Piggyback:100] Out: 1125 [Urine:1125]     Physical Exam: BP 140/70  Pulse 97  Temp 98.1 F (36.7 C) (Oral)  Resp 18  Ht 5\' 7"  (1.702 m)  Wt 107 lb 5.8 oz (48.7 kg)  BMI 16.82 kg/m2  SpO2 96%  General: Vital signs reviewed and noted.   Head: Normocephalic, atraumatic.  Eyes: conjunctivae/corneas clear.  EOM's intact.   Throat: normal  Neck: Supple. Normal carotids. No JVD  Lungs:  Decreased breath sounds bilaterally  Heart: RR, distant HS  Abdomen:  Soft, non-tender, non-distended with normoactive bowel sounds. No hepatomegaly. No rebound/guarding. No abdominal masses.  Extremities: Distal pedal pulses are 2+ .  No edema.  Legs are thin  Neurologic: A&O X3, CN II - XII are grossly intact. Motor strength is 5/5 in the all 4 extremities.  Psych: Responds to questions appropriately with normal affect.    Labs: BMET:  Basename 04/02/12 0520 04/01/12 0624  NA 133* 137  K 3.9 3.6  CL 98 97  CO2 29 29  GLUCOSE 145* 122*  BUN 11 11  CREATININE 0.55 0.68  CALCIUM 8.9 9.6    MG -- --  PHOS -- --    Liver function tests:  [facility] 04/02/12 0520 04/01/12 0624  AST 51* 32  ALT 31 19  ALKPHOS 80 86  BILITOT 0.3 0.5  PROT 6.2 7.5  ALBUMIN 2.9* 3.8   No results found for this basename: LIPASE:2,AMYLASE:2 in the last 72 hours  CBC:  Basename 04/02/12 0520 04/01/12 0624 03/30/12 2140  WBC 16.1* 24.3* --  NEUTROABS -- -- 16.4*  HGB 13.2 15.5 --  HCT 41.0 47.2 --  MCV 90.3 89.9 --  PLT 315 341 --    Cardiac Enzymes:  Basename 04/02/12 0520 04/01/12 2258 04/01/12 1823 04/01/12 1530  CKTOTAL -- -- -- --  CKMB -- -- -- --  TROPONINI 0.67* 1.07* 0.98* 0.92*    Coagulation Studies:  Basename 04/01/12 1823  LABPROT 12.9  INR 0.98    Other: No components found with this basename: POCBNP:3  Basename 04/01/12 1041  DDIMER 0.49*   No results found for this basename: HGBA1C in the last 72 hours  Basename 04/02/12 0520  CHOL 143  HDL 70  LDLCALC 62  TRIG 57  CHOLHDL 2.0   No results found for this basename: TSH,T4TOTAL,FREET3,T3FREE,THYROIDAB in the last 72 hours No results found for this basename: VITAMINB12,FOLATE,FERRITIN,TIBC,IRON,RETICCTPCT in the  last 72 hours   Tele:  NSR at 95  Medications:    Infusions:    . heparin 650 Units/hr (04/02/12 0300)  . [DISCONTINUED] heparin 550 Units/hr (04/01/12 1842)    Scheduled Medications:    . sodium chloride   Intravenous STAT  . albuterol  2.5 mg Nebulization Q4H  . antiseptic oral rinse  15 mL Mouth Rinse BID  . aspirin  325 mg Oral Daily  . atorvastatin  40 mg Oral q1800  . folic acid  1 mg Oral Daily  . guaiFENesin  600 mg Oral BID  . [COMPLETED] heparin  2,500 Units Intravenous Once  . levofloxacin  750 mg Intravenous Q24H  . [COMPLETED] magnesium sulfate 1 - 4 g bolus IVPB  2 g Intravenous Once  . methylPREDNISolone (SOLU-MEDROL) injection  60 mg Intravenous Q6H  . nicotine  21 mg Transdermal Daily  . sodium chloride  3 mL Intravenous Q12H  . thiamine  100 mg Oral  Daily  . tiotropium  18 mcg Inhalation Daily  . [DISCONTINUED] heparin  5,000 Units Subcutaneous Q8H  . [DISCONTINUED] methylPREDNISolone (SOLU-MEDROL) injection  125 mg Intravenous Daily    Assessment/ Plan:    COPD exacerbation (04/01/2012)   This appears to be his main problem.  His Troponin levels are dropping - the pattern is c/w COPD exacerbation and not a plaque rupture.    Acute bronchitis (04/01/2012) Per medicine.  Chronic hypercapnic respiratory failure (04/01/2012)  History of Hodgkin's disease (04/01/2012)  Troponin elevation: the pattern is c/w COPD exacerbation - not plaque rupture.  No angina.  Echo in August showed normal EF.  Repeat echo pending.  If the LV function is still normal, we will sign off.  He needs to stop smoking.  He has severe COPD and his prognosis is poor from that standpoint.   Disposition:  Length of Stay: 1  Vesta Mixer, Montez Hageman., MD, [facility] 04/02/2012, 7:30 AM Office [phone] Pager [phone]

## 2012-04-02 NOTE — ED Provider Notes (Signed)
Medical screening examination/treatment/procedure(s) were conducted as a shared visit with non-physician practitioner(s) and myself.  I personally evaluated the patient during the encounter. Patient evaluated on arrival for shortness of breath with history of COPD and brought by EMS. On exam has tachypnea with prolonged expirations decreased bilateral breath sounds. He is in moderate distress. IV steroids. IV magnesium. Oxygen via nonrebreather mask an hour-long and albuterol nebulized treatment initiated. Serial evaluations performed and clinically improving. Old records reviewed was here yesterday for the same. Chest x-ray obtained. IV antibiotics initiated. Patient clinically improving in the knee. Labs reviewed and medicine consult for admission.  Results for orders placed during the hospital encounter of 04/01/12  CBC      Component Value Range   WBC 24.3 (*) 4.0 - 10.5 K/uL   RBC 5.25  4.22 - 5.81 MIL/uL   Hemoglobin 15.5  13.0 - 17.0 g/dL   HCT 16.1  09.6 - 04.5 %   MCV 89.9  78.0 - 100.0 fL   MCH 29.5  26.0 - 34.0 pg   MCHC 32.8  30.0 - 36.0 g/dL   RDW 40.9 (*) 81.1 - 91.4 %   Platelets 341  150 - 400 K/uL  COMPREHENSIVE METABOLIC PANEL      Component Value Range   Sodium 137  135 - 145 mEq/L   Potassium 3.6  3.5 - 5.1 mEq/L   Chloride 97  96 - 112 mEq/L   CO2 29  19 - 32 mEq/L   Glucose, Bld 122 (*) 70 - 99 mg/dL   BUN 11  6 - 23 mg/dL   Creatinine, Ser 7.82  0.50 - 1.35 mg/dL   Calcium 9.6  8.4 - 95.6 mg/dL   Total Protein 7.5  6.0 - 8.3 g/dL   Albumin 3.8  3.5 - 5.2 g/dL   AST 32  0 - 37 U/L   ALT 19  0 - 53 U/L   Alkaline Phosphatase 86  39 - 117 U/L   Total Bilirubin 0.5  0.3 - 1.2 mg/dL   GFR calc non Af Amer >90  >90 mL/min   GFR calc Af Amer >90  >90 mL/min  BLOOD GAS, ARTERIAL      Component Value Range   FIO2 0.50     Delivery systems OXYGEN MASK     pH, Arterial 7.310 (*) 7.350 - 7.450   pCO2 arterial 54.7 (*) 35.0 - 45.0 mmHg   pO2, Arterial 130.0 (*) 80.0 -  100.0 mmHg   Bicarbonate 26.7 (*) 20.0 - 24.0 mEq/L   TCO2 23.6  0 - 100 mmol/L   Acid-base deficit 0.2  0.0 - 2.0 mmol/L   O2 Saturation 98.5     Patient temperature 98.6     Collection site RIGHT RADIAL     Drawn by [phone]     Sample type ARTERIAL     Allens test (pass/fail) PASS  PASS  PRO B NATRIURETIC PEPTIDE      Component Value Range   Pro B Natriuretic peptide (BNP) 686.8 (*) 0 - 125 pg/mL  TROPONIN I      Component Value Range   Troponin I <0.30  <0.30 ng/mL  D-DIMER, QUANTITATIVE      Component Value Range   D-Dimer, Quant 0.49 (*) 0.00 - 0.48 ug/mL-FEU  TROPONIN I      Component Value Range   Troponin I <0.30  <0.30 ng/mL  TROPONIN I      Component Value Range   Troponin I 0.92 (*) <  0.30 ng/mL  MRSA PCR SCREENING      Component Value Range   MRSA by PCR INVALID RESULTS, SPECIMEN SENT FOR CULTURE (*) NEGATIVE  APTT      Component Value Range   aPTT 33  24 - 37 seconds  PROTIME-INR      Component Value Range   Prothrombin Time 12.9  11.6 - 15.2 seconds   INR 0.98  0.00 - 1.49  TROPONIN I      Component Value Range   Troponin I 0.98 (*) <0.30 ng/mL  TROPONIN I      Component Value Range   Troponin I 1.07 (*) <0.30 ng/mL   Ct Angio Chest Pe W/cm &/or Wo Cm  04/01/2012  *RADIOLOGY REPORT*  Clinical Data: Dyspnea, pleuritic chest pain, cough, elevated D- dimer and elevated troponin levels.  CT ANGIOGRAPHY CHEST  Technique:  Multidetector CT imaging of the chest using the standard protocol during bolus administration of intravenous contrast. Multiplanar reconstructed images including MIPs were obtained and reviewed to evaluate the vascular anatomy.  Contrast: OMNIPAQUE IOHEXOL 350 MG/ML SOLN  Comparison: Chest x-ray earlier today as well as prior CTA of the chest on 12/24/2011.  Findings: Although pulmonary arterial opacification is somewhat suboptimal on today's study, there is no convincing evidence of pulmonary embolism.  There is a stable appearance to  advanced emphysematous lung disease with stable prominent pleural thickening and scarring at the right lung apex.  No focal airspace infiltrate, pneumothorax or pulmonary nodules are identified.  No enlarged lymph nodes identified.  The heart size is normal.  Stable atherosclerotic disease of the aortic arch without evidence of aortic aneurysm.  The bony thorax is unremarkable.  IMPRESSION: No evidence of pulmonary embolism.  Stable advanced emphysematous lung disease without evidence of focal infiltrate.   Original Report Authenticated By: Irish Lack, M.D.    Dg Chest Portable 1 View  04/01/2012  *RADIOLOGY REPORT*  Clinical Data: Respiratory distress.  PORTABLE CHEST - 1 VIEW  Comparison: 03/30/2012  Findings: Normal heart size and pulmonary vascularity.  Bullous emphysematous changes in the lungs with scattered interstitial fibrosis.  There is increasing perihilar infiltration in the lungs predominately on the left.  This may represent developing pneumonia or edema.  No blunting of costophrenic angles.  No pneumothorax. Old left rib fractures.  IMPRESSION: Emphysematous changes and fibrosis in the lungs.  Developing perihilar infiltration.   Original Report Authenticated By: Burman Nieves, M.D.    Dg Chest Portable 1 View  03/30/2012  *RADIOLOGY REPORT*  Clinical Data: Shortness of breath and history of pneumonia.  PORTABLE CHEST - 1 VIEW  Comparison: 01/20/2012  Findings: Numerous leads and wires project over the chest.  Remote left rib fractures.  Surgical clips in the left upper quadrant. Normal heart size.  Age advanced aortic atherosclerosis.  Trace right pleural fluid or thickening, chronic. No pneumothorax. Bullous emphysema.  Chronic interstitial thickening.  Resolved lingular airspace disease.  No acute lobar consolidation.  IMPRESSION: Bullous emphysema and chronic bronchitis.  Resolved lingular airspace disease.  No definite acute pneumonia identified.  Trace right pleural fluid  thickening.   Original Report Authenticated By: Jeronimo Greaves, M.D.       Sunnie Nielsen, MD 04/02/12 0005

## 2012-04-02 NOTE — Progress Notes (Signed)
*  PRELIMINARY RESULTS* Echocardiogram 2D Echocardiogram has been performed.  Philip Coleman 04/02/2012, 9:23 AM

## 2012-04-02 NOTE — Progress Notes (Signed)
ANTICOAGULATION CONSULT NOTE - Initial Consult  Pharmacy Consult for Heparin Indication: r/o NSTEMI   Allergies  Allergen Reactions  . Codeine Hives and Nausea And Vomiting    Patient Measurements: Height: 5\' 7"  (170.2 cm) Weight: 100 lb 1.4 oz (45.4 kg) IBW/kg (Calculated) : 66.1   Vital Signs: Temp: 98.5 F (36.9 C) (11/25 0000) Temp src: Oral (11/25 0000) BP: 130/75 mmHg (11/24 2100) Pulse Rate: 103  (11/24 2100)  Labs:  Basename 04/02/12 0042 04/01/12 2258 04/01/12 1823 04/01/12 1530 04/01/12 0624 03/30/12 2140  HGB -- -- -- -- 15.5 14.7  HCT -- -- -- -- 47.2 43.5  PLT -- -- -- -- 341 304  APTT -- -- 33 -- -- --  LABPROT -- -- 12.9 -- -- --  INR -- -- 0.98 -- -- --  HEPARINUNFRC 0.21* -- -- -- -- --  CREATININE -- -- -- -- 0.68 0.57  CKTOTAL -- -- -- -- -- --  CKMB -- -- -- -- -- --  TROPONINI -- 1.07* 0.98* 0.92* -- --    Estimated Creatinine Clearance: 67.8 ml/min (by C-G formula based on Cr of 0.68).   Medical History: Past Medical History  Diagnosis Date  . Thyroid disease     hyper, not on meds  . COPD (chronic obstructive pulmonary disease)   . Hodgkin's disease(201) 1993    with abdominal surgical scar -pt unable to describe   . Spontaneous pneumothorax     left  . Spontaneous pneumothorax     right  . Pneumonia   . Chronic respiratory failure   . Pneumonia     Medications:  Scheduled:     . sodium chloride   Intravenous STAT  . [COMPLETED] albuterol  15 mg Nebulization Once  . albuterol  2.5 mg Nebulization Q4H  . antiseptic oral rinse  15 mL Mouth Rinse BID  . aspirin  325 mg Oral Daily  . atorvastatin  40 mg Oral q1800  . folic acid  1 mg Oral Daily  . guaiFENesin  600 mg Oral BID  . [COMPLETED] heparin  2,500 Units Intravenous Once  . levofloxacin  750 mg Intravenous Q24H  . [COMPLETED] magnesium sulfate 1 - 4 g bolus IVPB  2 g Intravenous Once  . methylPREDNISolone (SOLU-MEDROL) injection  60 mg Intravenous Q6H  . [COMPLETED]   morphine injection  4 mg Intravenous Once  . nicotine  21 mg Transdermal Daily  . [COMPLETED] ondansetron  4 mg Intravenous Once  . sodium chloride  3 mL Intravenous Q12H  . thiamine  100 mg Oral Daily  . tiotropium  18 mcg Inhalation Daily  . [DISCONTINUED] heparin  5,000 Units Subcutaneous Q8H  . [DISCONTINUED] magnesium sulfate  2 g Intravenous Once  . [DISCONTINUED] methylPREDNISolone (SOLU-MEDROL) injection  125 mg Intravenous Daily   Infusions:     . heparin 550 Units/hr (04/01/12 1842)    Assessment:  56 YOM admit 11/24 with COPD exacerbation, bronchitis, and possible NSTEMI and/or PE.  IV heparin started per pharmacy dosing.  First prophylactic dose of heparin 5000 units SQ given today at 1500  D-dimer 0.49  Troponins:  <0.3, <0.3, 0.92  CBC wnl  CTA Chest = No pulmonary embolism  First heparin level = 0.21 with heparin drip infusing @ 550 units/hr  No complications of therapy noted  Goal of Therapy:  Heparin level 0.3-0.7 units/ml Monitor platelets by anticoagulation protocol: Yes   Plan:   Increase heparin IV infusion to 650 units/hr  Heparin level 6  hours after starting  Daily heparin level and CBC  Continue to monitor H&H and platelets  Terrilee Files, PharmD 04/02/2012 2:23 AM

## 2012-04-03 DIAGNOSIS — J189 Pneumonia, unspecified organism: Secondary | ICD-10-CM

## 2012-04-03 LAB — GLUCOSE, CAPILLARY: Glucose-Capillary: 126 mg/dL — ABNORMAL HIGH (ref 70–99)

## 2012-04-03 LAB — CBC
MCH: 30.1 pg (ref 26.0–34.0)
MCHC: 33.2 g/dL (ref 30.0–36.0)
Platelets: 332 10*3/uL (ref 150–400)

## 2012-04-03 LAB — BASIC METABOLIC PANEL
Calcium: 9 mg/dL (ref 8.4–10.5)
Creatinine, Ser: 0.57 mg/dL (ref 0.50–1.35)
GFR calc non Af Amer: >90 mL/min (ref >90)
Glucose, Bld: 130 mg/dL — ABNORMAL HIGH (ref 70–99)
Sodium: 138 mEq/L (ref 135–145)

## 2012-04-03 MED ORDER — LEVOFLOXACIN 500 MG PO TABS: 500.0000 mg | ORAL_TABLET | Freq: Every day | ORAL | Status: AC

## 2012-04-03 MED ORDER — ASPIRIN 325 MG PO TABS: 325.0000 mg | ORAL_TABLET | Freq: Every day | ORAL | Status: DC

## 2012-04-03 MED ORDER — PREDNISONE 10 MG PO TABS: ORAL_TABLET | ORAL | Status: DC

## 2012-04-03 MED ORDER — GUAIFENESIN ER 600 MG PO TB12: 600.0000 mg | ORAL_TABLET | Freq: Two times a day (BID) | ORAL | Status: DC

## 2012-04-03 MED ORDER — ATORVASTATIN CALCIUM 40 MG PO TABS: 40.0000 mg | ORAL_TABLET | Freq: Every day | ORAL | Status: DC

## 2012-04-03 MED ORDER — FOLIC ACID 1 MG PO TABS: 1.0000 mg | ORAL_TABLET | Freq: Every day | ORAL | Status: DC

## 2012-04-03 MED ORDER — THIAMINE HCL 100 MG PO TABS: 100.0000 mg | ORAL_TABLET | Freq: Every day | ORAL | Status: DC

## 2012-04-03 MED ORDER — HYDROCODONE-ACETAMINOPHEN 5-325 MG PO TABS: 1.0000 | ORAL_TABLET | ORAL | Status: DC | PRN

## 2012-04-03 NOTE — Consult Note (Signed)
PULMONARY  / CRITICAL CARE MEDICINE  Name: Philip Coleman MRN: [id number] DOB: 05/23/1955    LOS: 2  REFERRING PROVIDER:   Oti   CHIEF COMPLAINT:   Dyspnea   BRIEF PATIENT DESCRIPTION:     CULTURES:   ANTIBIOTICS: levaquin 11/24>>>  SIGNIFICANT EVENTS:  CT chest 11/24: No evidence of pulmonary embolism. Stable advanced emphysematous  lung disease without evidence of focal infiltrate.   HISTORY OF PRESENT ILLNESS:    56 year old male, with known history of tobacco abuse, ETOH abuse, severe protein-calorie malnutrition, Hodgkin's disease diagnosed about 30 years ago, treated with radiation, COPD/chronic respiratory failure on nocturnal O2 at 2L/min, recurrent spontaneous left pneumothorax s/p left VATS 09/22/11 by Dr [name], for stapling of blebs and mechanical pleurodesis. Patient was hospitalized 01/20/12-01/23/12, for lingular pneumonia, and according to him, he has felt unwell, since. Shortness of breath has been progressive, but on 03/31/12, he became acutely short of breath, with left-sided chest pain, wheeze, and a cough, productive of yellow phlegm, associated with subjective fever, butt no chills. He was seen in the ED on that date, and was discharged after bronchodilator treatment. He returned because of persistent/worsening symptoms early AM 04/01/12, called EMS. ED MD has administered iv Solumedrol and completed an hour-long nebulizer. He was admitted to the in-patient setting. Treatment to date has included: empiric abx, solumedrol, and scheduled BDs. His symptoms has dramatically improved with these therapies. PCCM asked to eval. To assist w/ setting up out-pt care f/u.     PAST MEDICAL HISTORY :  Past Medical History  Diagnosis Date  . Thyroid disease     hyper, not on meds  . COPD (chronic obstructive pulmonary disease)   . Hodgkin's disease(201) 1993    with abdominal surgical scar -pt unable to describe   . Spontaneous pneumothorax     left  . Spontaneous  pneumothorax     right  . Pneumonia   . Chronic respiratory failure   . Pneumonia    Past Surgical History  Procedure Date  . Splenectomy   . Chest tube insertion     left  . Chest tube insertion     right   Prior to Admission medications   Medication Sig Start Date End Date Taking? Authorizing Provider  albuterol (PROVENTIL HFA;VENTOLIN HFA) 108 (90 BASE) MCG/ACT inhaler Inhale 2 puffs into the lungs every 6 (six) hours as needed. wheezing   Yes Historical Provider, MD  azithromycin (ZITHROMAX) 250 MG tablet Take 1 tablet (250 mg total) by mouth daily. Take first 2 tablets together, then 1 every day until finished. 03/31/12  Yes Derwood Kaplan, MD  budesonide-formoterol (SYMBICORT) 160-4.5 MCG/ACT inhaler Inhale 2 puffs into the lungs 2 (two) times daily.   Yes Historical Provider, MD  famotidine (PEPCID AC) 10 MG chewable tablet Chew 10 mg by mouth daily as needed. heartache   Yes Historical Provider, MD  HYDROcodone-acetaminophen (NORCO/VICODIN) 5-325 MG per tablet Take 1 tablet by mouth every 4 (four) hours as needed for pain. 03/31/12  Yes Derwood Kaplan, MD  ibuprofen (ADVIL,MOTRIN) 600 MG tablet Take 1 tablet (600 mg total) by mouth every 6 (six) hours as needed for pain. 03/31/12  Yes Derwood Kaplan, MD  loperamide (IMODIUM) 2 MG capsule Take 2 mg by mouth 4 (four) times daily as needed. For diarrhea   Yes Historical Provider, MD  predniSONE (DELTASONE) 50 MG tablet Take 1 tablet (50 mg total) by mouth daily. 03/31/12  Yes Derwood Kaplan, MD  tiotropium [facility])  18 MCG inhalation capsule Place 1 capsule (18 mcg total) into inhaler and inhale daily. 12/26/11 12/25/12 Yes Estela Isaiah Blakes, MD   Allergies  Allergen Reactions  . Codeine Hives and Nausea And Vomiting    FAMILY HISTORY:  History reviewed. No pertinent family history. SOCIAL HISTORY:  reports that he has been smoking Cigarettes.  He has a 10.25 pack-year smoking history. He does not have any smokeless  tobacco history on file. He reports that he drinks about 3.6 ounces of alcohol per week. He reports that he does not use illicit drugs.  REVIEW OF SYSTEMS:   Constitutional: positive for fever, chills, +weight loss, +malaise/fatigue and diaphoresis.  HENT: Negative for hearing loss, ear pain, nosebleeds, congestion, sore throat, neck pain, tinnitus and ear discharge.   Eyes: Negative for blurred vision, double vision, photophobia, pain, discharge and redness.  Respiratory: + for cough,- hemoptysis, +sputum production, +shortness of breath, +wheezing and stridor.   Cardiovascular: + chronic left chest pain, palpitations, orthopnea, claudication, leg swelling and PND.  Gastrointestinal: Negative for heartburn, nausea, vomiting, abdominal pain, diarrhea, constipation, blood in stool and melena.  Genitourinary: Negative for dysuria, urgency, frequency, hematuria and flank pain.  Musculoskeletal: Negative for myalgias, back pain, joint pain and falls.  Skin: Negative for itching and rash.  Neurological: Negative for dizziness, tingling, tremors, sensory change, speech change, focal weakness, seizures, loss of consciousness, weakness and headaches.  Endo/Heme/Allergies: Negative for environmental allergies and polydipsia. Does not bruise/bleed easily.   VITAL SIGNS: Temp:  [98.4 F (36.9 C)-98.8 F (37.1 C)] 98.4 F (36.9 C) (11/26 0610) Pulse Rate:  [84-108] 84  (11/26 0610) Resp:  [16-20] 16  (11/26 0610) BP: (146-160)/(78-93) 156/91 mmHg (11/26 0610) SpO2:  [92 %-99 %] 96 % (11/26 0841) 2 liters  PHYSICAL EXAMINATION: General:  Chronically  Neuro:  Awake, oriented, no focal def HEENT:  Edentulous, no JVD Cardiovascular:  rrr Lungs:  Dry crackles in bases, no wheeze  Abdomen:  Soft, non-tender  Musculoskeletal:  Intact  Skin:  Intact    Lab 04/03/12 0502 04/02/12 0520 04/01/12 0624  NA 138 133* 137  K 3.8 3.9 3.6  CL 100 98 97  CO2 32 29 29  BUN 15 11 11   CREATININE 0.57 0.55  0.68  GLUCOSE 130* 145* 122*    Lab 04/03/12 0502 04/02/12 0520 04/01/12 0624  HGB 13.2 13.2 15.5  HCT 39.7 41.0 47.2  WBC 15.0* 16.1* 24.3*  PLT 332 315 341   Ct Angio Chest Pe W/cm &/or Wo Cm  04/01/2012  *RADIOLOGY REPORT*  Clinical Data: Dyspnea, pleuritic chest pain, cough, elevated D- dimer and elevated troponin levels.  CT ANGIOGRAPHY CHEST  Technique:  Multidetector CT imaging of the chest using the standard protocol during bolus administration of intravenous contrast. Multiplanar reconstructed images including MIPs were obtained and reviewed to evaluate the vascular anatomy.  Contrast: OMNIPAQUE IOHEXOL 350 MG/ML SOLN  Comparison: Chest x-ray earlier today as well as prior CTA of the chest on 12/24/2011.  Findings: Although pulmonary arterial opacification is somewhat suboptimal on today's study, there is no convincing evidence of pulmonary embolism.  There is a stable appearance to advanced emphysematous lung disease with stable prominent pleural thickening and scarring at the right lung apex.  No focal airspace infiltrate, pneumothorax or pulmonary nodules are identified.  No enlarged lymph nodes identified.  The heart size is normal.  Stable atherosclerotic disease of the aortic arch without evidence of aortic aneurysm.  The bony thorax is unremarkable.  IMPRESSION: No evidence of pulmonary embolism.  Stable advanced emphysematous lung disease without evidence of focal infiltrate.   Original Report Authenticated By: Irish Lack, M.D.     ASSESSMENT / PLAN:  Acute on Chronic respiratory failure in setting of AECOPD Purulent Bronchitis  Severe bullous emphysema Active smoker  He has responded well to current rx. He has excellent out-pt regimen, but the fact that he continues to smoke is concerning. We have discussed the risk of this including recurrent spontaneous pneumothorax. He has severe bullous emphysema and is at risk for future PTX . He needs pulmonary follow up closer  to home as he is not able to get into his own pulmonary MD for several months.  Recommendations 1) stop smoking 2) complete 5 d total abx  3) pred taper  4) home on his spiriva and symbicort. As well as his rescue SABA  5)  F/u w/ Rubye Oaks NP on Monday Dec 2nd at 9 30am,  , then will set up with  Dr Craige Cotta on Thursday Dec 12th at 415 to establish pulmonary in [place], he also intends to keep his pulmonologist at [facility].  6) check walking oximetry prior to d/c to assess Oxygen needs  We will s/o call PRN  Pulmonary and Critical Care Medicine [facility] Pager: [phone]  04/03/2012, 12:32 PM  Patient seen and examined, agree with above note.  I dictated the care and orders written for this patient under my direction.  [name], M.D. [phone]

## 2012-04-03 NOTE — Progress Notes (Signed)
OT Screen Order received, chart reviewed. Pt adamantly denying the need for therapy at this time. Attempted x2. Pt states he is able to ambulate without a device and is able to care for self at home. Will sign off at this time. Please re-order if pt becomes agreeable.  Garrel Ridgel, OTR/L  Pager [phone] 04/03/2012

## 2012-04-03 NOTE — Progress Notes (Signed)
COPD GOLD PROTOCOL Talked to patient about follow up medical care; patient does not have a PCP but goes to has a Diplomatic Services operational officer at [facility] Dr Juluis Mire; Patient plans to see a Pulmonologist with the [facility] - closer to home; Information given to patient on finding a primary care physician. Patient stated "these doctors are money hungry", CM informed the patient of the importance of having a primary care physician.  Patient stated that he will read over the information and decide which doctor to go to; [facility] choice [facility] - patient stated that they provide excellent service and plans to stay with them. Patient is independent of ADL's, he does not drive a car because "the insurance on a care is too high" and travels via a scooter for transportation. Patient does his own grocery shopping and stated "I like to stay as independent as possible". No HHC needed. Patient also stated that he is not depressed because he lives alone, " I'm looking for a woman".

## 2012-04-03 NOTE — Progress Notes (Signed)
PT Cancellation Note  Patient Details Name: Philip Coleman MRN: [id number] DOB: 1956/04/26   Cancelled Treatment:    Reason Eval/Treat Not Completed: Other (comment) (pt declines need for PT. states He is up ad lib.Pt. Is able to ambulate with staff if needed. )   [name] 04/03/2012, 1:16 [id number]

## 2012-04-03 NOTE — Progress Notes (Signed)
Discharge instructions explained and prescriptions given. Patient verbalizes understanding of same.

## 2012-04-04 NOTE — Progress Notes (Signed)
CSW was contacted with for transportation issues, patient states he has no one to pick him up and RN does not feel safe with patient going via bus. CSW met with patient to confirm address & travel plans. CSW completed taxi cab voucher& left RN the # for [employer] (ph#: [phone]) & taxi voucher. CSW signing off.   Unice Bailey, LCSW [facility] Clinical Social Worker cell #: [phone]

## 2012-04-05 LAB — MRSA CULTURE

## 2012-04-09 ENCOUNTER — Inpatient Hospital Stay: Payer: Medicare Other | Admitting: Adult Health

## 2012-04-13 ENCOUNTER — Inpatient Hospital Stay: Payer: Medicare Other | Admitting: Adult Health

## 2012-04-13 ENCOUNTER — Encounter: Payer: Self-pay | Admitting: Adult Health

## 2012-04-13 ENCOUNTER — Ambulatory Visit (INDEPENDENT_AMBULATORY_CARE_PROVIDER_SITE_OTHER): Payer: Medicare Other | Admitting: Adult Health

## 2012-04-13 VITALS — BP 124/76 | HR 95 | Temp 99.2°F | Ht 67.0 in | Wt 106.6 lb

## 2012-04-13 DIAGNOSIS — J449 Chronic obstructive pulmonary disease, unspecified: Secondary | ICD-10-CM

## 2012-04-13 NOTE — Assessment & Plan Note (Addendum)
Recurrent exacerbations on pt actively smoking.  Encouraged on smoking cessation  Will need PFT in future   Plan Continue Symbicort and Spiriva  Work on stopping smoking  follow up next week with Dr. Craige Cotta  And As needed   Please contact office for sooner follow up if symptoms do not improve or worsen or seek emergency care

## 2012-04-13 NOTE — Progress Notes (Signed)
  Subjective:    Patient ID: Philip Coleman, male    DOB: Jan 07, 1956, 56 y.o.   MRN: [id number]  HPI 56 yo active smoker, etoh use male seen for initial pulmonary consult during hospitalization 03/2012 for COPD exacerbaiton  Pt has multiple ER and Hospital admissions for respiratory issues.  Lives in boarding house.  [facility] following 03/2012   04/13/2012 Post Hospital follow up  Readmitted for COPD exacerbation 04/01/12  He has multiple admissions to ER and hospital over  Last 6 months, required SDU w/ aggressive  with oxygen supplementation, frequent bronchodilator nebulizers, parenteral steroids, mucolytics and antibiotics.   Recommended home on his Spiriva and symbicort.  We discussed smoking cessation. Says he has tried multiple quit smoking w/ patches, gum, meds , etc.  [facility] is helping with transportation to appointments. They are following him at home as well.  Previously worked with painting homes.  Disabled on Medicare now.  Has medicaid to cover her prescriptions.          Review of Systems Constitutional:   No  weight loss, night sweats,  Fevers, chills,  +fatigue, or  lassitude.  HEENT:   No headaches,  Difficulty swallowing,  Tooth/dental problems, or  Sore throat,                No sneezing, itching, ear ache,  +nasal congestion, post nasal drip,   CV:  No chest pain,  Orthopnea, PND, swelling in lower extremities, anasarca, dizziness, palpitations, syncope.   GI  No heartburn, indigestion, abdominal pain, nausea, vomiting, diarrhea, change in bowel habits, loss of appetite, bloody stools.   Resp:    No coughing up of blood.    No chest wall deformity  Skin: no rash or lesions.  GU: no dysuria, change in color of urine, no urgency or frequency.  No flank pain, no hematuria   MS:  No joint pain or swelling.  No decreased range of motion.  No back pain.  Psych:  No change in mood or affect. No depression or anxiety.  No memory loss.         Objective:   Physical Exam GEN: A/Ox3; pleasant , NAD, thin , cachexic   HEENT:  Gleason/AT,  EACs-clear, TMs-wnl, NOSE-clear, THROAT-clear, no lesions, no postnasal drip or exudate noted.   NECK:  Supple w/ fair ROM; no JVD; normal carotid impulses w/o bruits; no thyromegaly or nodules palpated; no lymphadenopathy.  RESP  Diminished BS in bases  w/o, wheezes/ rales/ or rhonchi.no accessory muscle use, no dullness to percussion  CARD:  RRR, no m/r/g  , no peripheral edema, pulses intact, no cyanosis or clubbing.  GI:   Soft & nt; nml bowel sounds; no organomegaly or masses detected.  Musco: Warm bil, no deformities or joint swelling noted.   Neuro: alert, no focal deficits noted.    Skin: Warm, no lesions or rashes         Assessment & Plan:

## 2012-04-13 NOTE — Progress Notes (Signed)
Reviewed and agree with assessment/plan. 

## 2012-04-13 NOTE — Patient Instructions (Addendum)
Continue Symbicort and Spiriva  Work on stopping smoking  follow up next week with Dr. Craige Cotta  And As needed   Please contact office for sooner follow up if symptoms do not improve or worsen or seek emergency care

## 2012-04-17 ENCOUNTER — Telehealth: Payer: Self-pay | Admitting: Pulmonary Disease

## 2012-04-17 NOTE — Telephone Encounter (Signed)
Last OV 04-13-12. Pt is requesting an rx for prednisone and hydrocodone. I advised that he was supposed to take a pred taper ans stop once med was gone. Pt states understanding. Pt is also requesting a refill on hydrocodone. I advised him that an Rx was written on 04-03-12 did he get this? He states he only received 6 tablets not #30. I called [facility] and pt did receive #30 tablets on 04-06-12. I asked the pt if he has a PCP and he stated he has an appt on 04-19-12 with PCP but cannot wait until then. Pt actually has an appt on 04-19-12 with Dr. Craige Cotta.  Dr. Craige Cotta please advise on rx? Carron Curie, CMA

## 2012-04-17 NOTE — Telephone Encounter (Signed)
[email or web address] to advise pt. Philip Coleman, CMA

## 2012-04-17 NOTE — Telephone Encounter (Signed)
Please send script for prednisone 10 mg daily, and dispense 7 pills with no refills.  He has appointment with internal medicine teaching service on 04/25/12 for primary care.  Please advised him to discuss his pain medication issues with IM teaching service.  Please ensure the patient knows he has follow up appointment with me on 04/19/12 for his COPD.  His other medical issues will need to be addressed through primary care.

## 2012-04-18 NOTE — Telephone Encounter (Signed)
LMOMTCB x 1 for the pt. Need to inform pt of VS recs and verify pharmacy for Prednisone RX.

## 2012-04-18 NOTE — Telephone Encounter (Signed)
Pt returned call. Philip Coleman  

## 2012-04-19 ENCOUNTER — Ambulatory Visit: Payer: Medicare Other | Admitting: Pulmonary Disease

## 2012-04-19 ENCOUNTER — Telehealth: Payer: Self-pay | Admitting: Pulmonary Disease

## 2012-04-19 MED ORDER — PREDNISONE 10 MG PO TABS: 10.0000 mg | ORAL_TABLET | Freq: Every day | ORAL | Status: DC

## 2012-04-19 NOTE — Telephone Encounter (Signed)
Pt called back and stated he needed to cancel appt for Monday due to a meeting with [employer]. He is unsure how long the meeting will take. Appt r/s to Tuesday morning at 9am, ok per Mindy. Carron Curie, CMA

## 2012-04-19 NOTE — Telephone Encounter (Addendum)
I spoke with April [facility] nurse and she verified the pt pharmacy is [facility] [address]  She also stated that the pt cancelled the appt today because he is too sick to come in. She is concerned about this and I advised I will call the pt to see if I can get him to come in. Rx for prednisone sent. I then called the pt and spoke with him. He is refusing to come in today and stated "i will just go to the ER if I have to." I advised the pt that is why I was trying to get him to come in to the office so he would not have to go to ER, that it was best if he was treated here. Pt still refused an appt. I advised that we are sending in the prednisone rx but for only 7 tablets. I advised him of the appt for Monday and that it was important for him to keep this appt. Pt states understanding. Carron Curie, CMA

## 2012-04-19 NOTE — Telephone Encounter (Signed)
[email or web address] to inform pt of VS recs and check pharmacy

## 2012-04-19 NOTE — Telephone Encounter (Signed)
I will close this message, continued on old message.Carron Curie, CMA

## 2012-04-23 ENCOUNTER — Ambulatory Visit: Payer: Medicare Other | Admitting: Pulmonary Disease

## 2012-04-24 ENCOUNTER — Encounter: Payer: Self-pay | Admitting: Pulmonary Disease

## 2012-04-24 ENCOUNTER — Ambulatory Visit (INDEPENDENT_AMBULATORY_CARE_PROVIDER_SITE_OTHER): Payer: Medicare Other | Admitting: Pulmonary Disease

## 2012-04-24 VITALS — BP 122/76 | HR 87 | Temp 97.8°F | Ht 67.0 in | Wt 107.0 lb

## 2012-04-24 DIAGNOSIS — Z720 Tobacco use: Secondary | ICD-10-CM

## 2012-04-24 DIAGNOSIS — J449 Chronic obstructive pulmonary disease, unspecified: Secondary | ICD-10-CM

## 2012-04-24 DIAGNOSIS — F17200 Nicotine dependence, unspecified, uncomplicated: Secondary | ICD-10-CM

## 2012-04-24 DIAGNOSIS — J9610 Chronic respiratory failure, unspecified whether with hypoxia or hypercapnia: Secondary | ICD-10-CM

## 2012-04-24 MED ORDER — OXYCODONE HCL 5 MG PO CAPS: 5.0000 mg | ORAL_CAPSULE | Freq: Four times a day (QID) | ORAL | Status: DC | PRN

## 2012-04-24 MED ORDER — HYDROCODONE-ACETAMINOPHEN 5-325 MG PO TABS: 1.0000 | ORAL_TABLET | ORAL | Status: DC | PRN

## 2012-04-24 NOTE — Progress Notes (Signed)
Chief Complaint  Patient presents with  . Follow-up    has good and bad days with breathing. c/o slight cough w/ cleasr-white phlem, wheezing and chest tx. c/o CP getting worse daily. does not have a cardiologists. pt sleep with o2 at night. has portab;e oxygen but does not use it    History of Present Illness: Philip Coleman is a 56 y.o. male smoker with severe COPD and chronic hypoxic respiratory failure.  He still smokes, but has been trying to cut down.  He still has cough and wheeze.  He brings up clear to yellow sputum, but denies hemoptysis.  He is not having fever or chest pain.  He is using his inhalers, and these help.  He is using his oxygen when he is asleep, but not as much with activity.   TESTS: CT chest 04/01/12>>advanced emphysema, rt apical scarring Echo 04/02/12>>EF 55%  Past Medical History  Diagnosis Date  . Thyroid disease     hyper, not on meds  . COPD (chronic obstructive pulmonary disease)   . Hodgkin's disease(201) 1993    with abdominal surgical scar -pt unable to describe   . Spontaneous pneumothorax     left  . Spontaneous pneumothorax     right  . Pneumonia   . Chronic respiratory failure   . Pneumonia     Past Surgical History  Procedure Date  . Splenectomy   . Chest tube insertion     left  . Chest tube insertion     right    Outpatient Encounter Prescriptions as of 04/24/2012  Medication Sig Dispense Refill  . albuterol (PROVENTIL HFA;VENTOLIN HFA) 108 (90 BASE) MCG/ACT inhaler Inhale 2 puffs into the lungs every 6 (six) hours as needed. wheezing      . aspirin 325 MG tablet Take 1 tablet (325 mg total) by mouth daily.  100 tablet  0  . budesonide-formoterol (SYMBICORT) 160-4.5 MCG/ACT inhaler Inhale 2 puffs into the lungs 2 (two) times daily.      . famotidine (PEPCID AC) 10 MG chewable tablet Chew 10 mg by mouth daily as needed. heartache      . folic acid (FOLVITE) 1 MG tablet Take 1 tablet (1 mg total) by mouth daily.  30  tablet  0  . guaiFENesin (MUCINEX) 600 MG 12 hr tablet Take 1 tablet (600 mg total) by mouth 2 (two) times daily.  14 tablet  0  . ibuprofen (ADVIL,MOTRIN) 600 MG tablet Take 1 tablet (600 mg total) by mouth every 6 (six) hours as needed for pain.  30 tablet  0  . loperamide (IMODIUM) 2 MG capsule Take 2 mg by mouth 4 (four) times daily as needed. For diarrhea      . thiamine 100 MG tablet Take 1 tablet (100 mg total) by mouth daily.  30 tablet  0  . tiotropium (SPIRIVA) 18 MCG inhalation capsule Place 1 capsule (18 mcg total) into inhaler and inhale daily.  30 capsule  1  . atorvastatin (LIPITOR) 40 MG tablet Take 1 tablet (40 mg total) by mouth daily at 6 PM.  30 tablet  0  . [DISCONTINUED] HYDROcodone-acetaminophen (NORCO/VICODIN) 5-325 MG per tablet Take 1 tablet by mouth every 4 (four) hours as needed.  30 tablet  0  . [DISCONTINUED] predniSONE (DELTASONE) 10 MG tablet Take 1 tablet (10 mg total) by mouth daily.  7 tablet  0    Allergies  Allergen Reactions  . Codeine Hives and Nausea And Vomiting  Physical Exam:  Filed Vitals:   04/24/12 0853 04/24/12 0854  BP:  122/76  Pulse:  87  Temp: 97.8 F (36.6 C)   TempSrc: Oral   Height: 5\' 7"  (1.702 m)   Weight: 107 lb (48.535 kg)   SpO2:  88%    Body mass index is 16.76 kg/(m^2).   Wt Readings from Last 2 Encounters:  04/24/12 107 lb (48.535 kg)  04/13/12 106 lb 9.6 oz (48.353 kg)     General - Thin ENT - No sinus tenderness, no oral exudate, no LAN Cardiac - s1s2 regular, no murmur Chest - Decreased breath sounds, scattered rhonchi, no wheeze Back - No focal tenderness Abd - Soft, non-tender Ext - No edema Neuro - Normal strength Skin - No rashes Psych - agitated at times   Assessment/Plan:  [name], MD [facility] Pager:  [phone] 04/24/2012, 8:57 AM

## 2012-04-24 NOTE — Patient Instructions (Signed)
Symbicort two puffs twice per day Spiriva one puff daily Follow up in 4 months

## 2012-04-25 ENCOUNTER — Encounter: Payer: Self-pay | Admitting: Pulmonary Disease

## 2012-04-25 ENCOUNTER — Ambulatory Visit (INDEPENDENT_AMBULATORY_CARE_PROVIDER_SITE_OTHER): Payer: Medicare Other | Admitting: Internal Medicine

## 2012-04-25 ENCOUNTER — Ambulatory Visit: Payer: Medicare Other | Admitting: Radiation Oncology

## 2012-04-25 ENCOUNTER — Encounter: Payer: Self-pay | Admitting: Internal Medicine

## 2012-04-25 VITALS — BP 98/58 | HR 87 | Temp 97.2°F | Ht 67.0 in | Wt 106.6 lb

## 2012-04-25 DIAGNOSIS — J438 Other emphysema: Secondary | ICD-10-CM

## 2012-04-25 DIAGNOSIS — J9610 Chronic respiratory failure, unspecified whether with hypoxia or hypercapnia: Secondary | ICD-10-CM

## 2012-04-25 DIAGNOSIS — J439 Emphysema, unspecified: Secondary | ICD-10-CM

## 2012-04-25 NOTE — Assessment & Plan Note (Signed)
I explained to him the rationale for using supplemental oxygen and importance of using oxygen with activities.

## 2012-04-25 NOTE — Assessment & Plan Note (Signed)
Encourage to continue his smoking cessation efforts.

## 2012-04-25 NOTE — Assessment & Plan Note (Addendum)
He is to continue his current inhaler regimen.  I don't think he needs additional prednisone at this time.  I emphasized to him that he should call our office first if his respiratory symptoms progress rather than going to the ER first.  At some point he will needs to have PFTs, A1AT testing, and evaluation for pulmonary rehab.

## 2012-04-25 NOTE — Progress Notes (Signed)
  Subjective:    Patient ID: Philip Coleman, male    DOB: 1955-11-26, 56 y.o.   MRN: [id number]  HPI  Pt presented to [facility] today to establish with a doctor could right prescriptions for his pain meds'. Pt unknown to [facility] but referred through [employer].  Pt followed by [facility] Pulmonology for COPD with emphysema, has h/o chronic respiratory failure, left and right pneumothorax, ETOH abuse, and chronic left chest pain after VATS procedure. As soon as MD entered the room obtain HPI including how he was referred to clinic, pt stated that "all the information should be in that $2000 computer of yours and that's the problem with Health Care now, none of y'all no what;s going on". MD informed patient that pain medication would not be provided by this clinic without full evaluation and establishment of care.  Furthermore, that pain medications would not be the only purpose of [facility] as his PCP.   Review of Systems Unable to obtain secondary to patient leaving    Objective:   Physical Exam Unable to obtain secondary to patient leaving       Assessment & Plan:  1. Pain Control: pt left abruptly, did advise pt that Evan or [facility] could attempt referral to Pain Clinic for oxycodone management  2. Disposition: pt should not be considered [facility] patient thus is unassigned at this point unless Darrol Poke refers to their [facility]

## 2012-04-26 ENCOUNTER — Telehealth: Payer: Self-pay | Admitting: Pulmonary Disease

## 2012-04-26 NOTE — Telephone Encounter (Signed)
lmomtcb x1 elanor

## 2012-04-26 NOTE — Telephone Encounter (Signed)
I spoke with Elanor and she stated pt went to see outpt clinic over at [facility] yesterday to establish PCP. Pt will be released from their services as pt walked out as he immediately wanted narcotics. The Office note is in EPIC. Elanor spoke with pt and advised him if he did not have a PCP then he could not be in the program with them. She is going out to visit with him today. She is not sure what else to do. She is requesting guidance from Dr. Craige Cotta. She is aware pt is abusing the narcotics and the reason he is so upset no one wants to give him these medications. Please advise Dr. Craige Cotta thanks

## 2012-04-30 ENCOUNTER — Telehealth: Payer: Self-pay | Admitting: Pulmonary Disease

## 2012-04-30 MED ORDER — ALBUTEROL SULFATE HFA 108 (90 BASE) MCG/ACT IN AERS: 2.0000 | INHALATION_SPRAY | Freq: Four times a day (QID) | RESPIRATORY_TRACT | Status: DC | PRN

## 2012-04-30 MED ORDER — OXYCODONE HCL 5 MG PO CAPS: 5.0000 mg | ORAL_CAPSULE | Freq: Four times a day (QID) | ORAL | Status: DC | PRN

## 2012-04-30 NOTE — Telephone Encounter (Signed)
Okay to send script for oxycodone 5 mg one q6h prn pain, dispense 30 with no refills.

## 2012-04-30 NOTE — Telephone Encounter (Signed)
Pt aware. rx has been printed. Off. Pt will come by the office tomorrow to pick this up. He is aware we close at 1 PM and is closed on Wednesday. nohting further was needed

## 2012-04-30 NOTE — Telephone Encounter (Signed)
Pt given rx for oxycodone on 04-24-12 for #30 tablets. Pt states he will run out over the holiday and wants a refill sent in so he does not have to go without this medication. Refill for proair sent.  Please advise.Carron Curie, CMA

## 2012-04-30 NOTE — Telephone Encounter (Signed)
Refill for oxycodone given x 1 04/30/12.  Will need to discuss at next ROV.

## 2012-05-03 ENCOUNTER — Ambulatory Visit: Payer: Medicare Other | Admitting: Pulmonary Disease

## 2012-08-03 ENCOUNTER — Other Ambulatory Visit: Payer: Self-pay | Admitting: Geriatric Medicine

## 2012-08-07 ENCOUNTER — Other Ambulatory Visit: Payer: Self-pay | Admitting: *Deleted

## 2012-08-07 ENCOUNTER — Ambulatory Visit: Payer: Medicare Other | Admitting: Pulmonary Disease

## 2012-08-09 ENCOUNTER — Ambulatory Visit: Payer: Medicare Other | Admitting: Adult Health

## 2012-08-13 ENCOUNTER — Ambulatory Visit (INDEPENDENT_AMBULATORY_CARE_PROVIDER_SITE_OTHER)
Admission: RE | Admit: 2012-08-13 | Discharge: 2012-08-13 | Disposition: A | Discharge status: Home / Self Care | Payer: Medicare Other | Source: Ambulatory Visit | Attending: Adult Health | Admitting: Adult Health

## 2012-08-13 ENCOUNTER — Encounter: Payer: Self-pay | Admitting: Adult Health

## 2012-08-13 ENCOUNTER — Ambulatory Visit (INDEPENDENT_AMBULATORY_CARE_PROVIDER_SITE_OTHER): Payer: Medicare Other | Admitting: Adult Health

## 2012-08-13 VITALS — BP 132/74 | HR 96 | Temp 98.1°F | Ht 67.0 in | Wt 109.0 lb

## 2012-08-13 DIAGNOSIS — J439 Emphysema, unspecified: Secondary | ICD-10-CM

## 2012-08-13 DIAGNOSIS — J438 Other emphysema: Secondary | ICD-10-CM

## 2012-08-13 DIAGNOSIS — E43 Unspecified severe protein-calorie malnutrition: Secondary | ICD-10-CM

## 2012-08-13 MED ORDER — TRAMADOL HCL 50 MG PO TABS: 50.0000 mg | ORAL_TABLET | Freq: Four times a day (QID) | ORAL | Status: DC | PRN

## 2012-08-13 NOTE — Patient Instructions (Addendum)
Continue Symbicort and Spiriva  Work on stopping smoking -goal is to be down to 4 cigs daily on return .  Begin Ensure plus Twice daily  Between meals.  May use Tramadol 50mg  every 6 hr As needed  Pain.  Warm heat to ribs As needed   Follow up with Dr. Craige Cotta  In 6 weeks And As needed   Please contact office for sooner follow up if symptoms do not improve or worsen or seek emergency care

## 2012-08-13 NOTE — Progress Notes (Signed)
  Subjective:    Patient ID: Philip Coleman, male    DOB: 02/21/1956, 57 y.o.   MRN: [id number]  HPI 57 yo male smoker with severe COPD and chronic hypoxic respiratory failure.Hx of narcotic and etoh abuse .    08/13/2012 Follow up  3 month follow up COPD  Complains of  increased SOB, wheezing, increased O2 use, sharp pain in left rib area, prod cough with cleat/white mucus x2 months - denied f/c/s His pain medication refill of oxycodone. We discussed narcotic abuse and addiction. He is agreed to a trial of tramadol instead. Still smoking, down to 5 cigs daily . We discussed smoking cessation He has Cut back on drinking etoh.  Wearing O2 At bedtime  , wear As needed  With activity at home.  Going to start ensure plus Twice daily  B/tw meals.  Patient is accompanied by a [facility] nurse at  today's visit   TESTS: CT chest 04/01/12>>advanced emphysema, rt apical scarring Echo 04/02/12>>EF 55%   Review of Systems Constitutional:   No  weight loss, night sweats,  Fevers, chills, fatigue, or  lassitude.  HEENT:   No headaches,  Difficulty swallowing,  Tooth/dental problems, or  Sore throat,                No sneezing, itching, ear ache, nasal congestion, post nasal drip,   CV:  No chest pain,  Orthopnea, PND, swelling in lower extremities, anasarca, dizziness, palpitations, syncope.   GI  No heartburn, indigestion, abdominal pain, nausea, vomiting, diarrhea, change in bowel habits, loss of appetite, bloody stools.   Resp:  No chest wall deformity  Skin: no rash or lesions.  GU: no dysuria, change in color of urine, no urgency or frequency.  No flank pain, no hematuria   MS:  No joint pain or swelling.  No decreased range of motion.  No back pain.  Psych:  No change in mood or affect. No depression or anxiety.  No memory loss.         Objective:   Physical Exam GEN: A/Ox3; pleasant , NAD, thin , cachexic   HEENT:  Winger/AT,  EACs-clear, TMs-wnl, NOSE-clear,  THROAT-clear, no lesions, no postnasal drip or exudate noted.   NECK:  Supple w/ fair ROM; no JVD; normal carotid impulses w/o bruits; no thyromegaly or nodules palpated; no lymphadenopathy.  RESP Diminished BS in bases, no accessory muscle use, no dullness to percussion  CARD:  RRR, no m/r/g  , no peripheral edema, pulses intact, no cyanosis or clubbing.  GI:   Soft & nt; nml bowel sounds; no organomegaly or masses detected.  Musco: Warm bil, no deformities or joint swelling noted.   Neuro: alert, no focal deficits noted.    Skin: Warm, no lesions or rashes         Assessment & Plan:

## 2012-08-13 NOTE — Addendum Note (Signed)
Addended by: Boone Master E on: 08/13/2012 10:39 AM   Modules accepted: Orders

## 2012-08-13 NOTE — Progress Notes (Signed)
Reviewed and agree with assessment/plan. 

## 2012-08-13 NOTE — Assessment & Plan Note (Addendum)
Compensated on present regimen without, active flare. Patient is obviously malnourished. His condition is complicated by ongoing smoking and alcohol use. Complains of chronic pain in left ribs- will try to use tramadol instead of oxycodone. Pt agreed for trial.    Plan  Continue Symbicort and Spiriva  Work on stopping smoking -goal is to be down to 4 cigs daily on return .  Begin Ensure plus Twice daily  Between meals.  May use Tramadol 50mg  every 6 hr As needed  Pain.  Warm heat to ribs As needed   Follow up with Dr. Craige Cotta  In 6 weeks And As needed   Please contact office for sooner follow up if symptoms do not improve or worsen or seek emergency care

## 2012-08-13 NOTE — Assessment & Plan Note (Signed)
Begin ensure plus Twice daily  B/t  Meals.

## 2012-08-23 ENCOUNTER — Encounter: Payer: Self-pay | Admitting: *Deleted

## 2012-08-24 ENCOUNTER — Encounter: Payer: Self-pay | Admitting: *Deleted

## 2012-08-27 ENCOUNTER — Encounter: Payer: Self-pay | Admitting: Nurse Practitioner

## 2012-08-28 ENCOUNTER — Encounter: Payer: Self-pay | Admitting: Nurse Practitioner

## 2012-08-29 IMAGING — CR DG CHEST 2V
2 series · 2 of 2 positions shown · non-contrast
Comparison: Chest x-ray of 09/30/2013

CLINICAL DATA: Follow up of pneumothorax

CHEST - 2 VIEW

[view not recorded (1 of 2)]
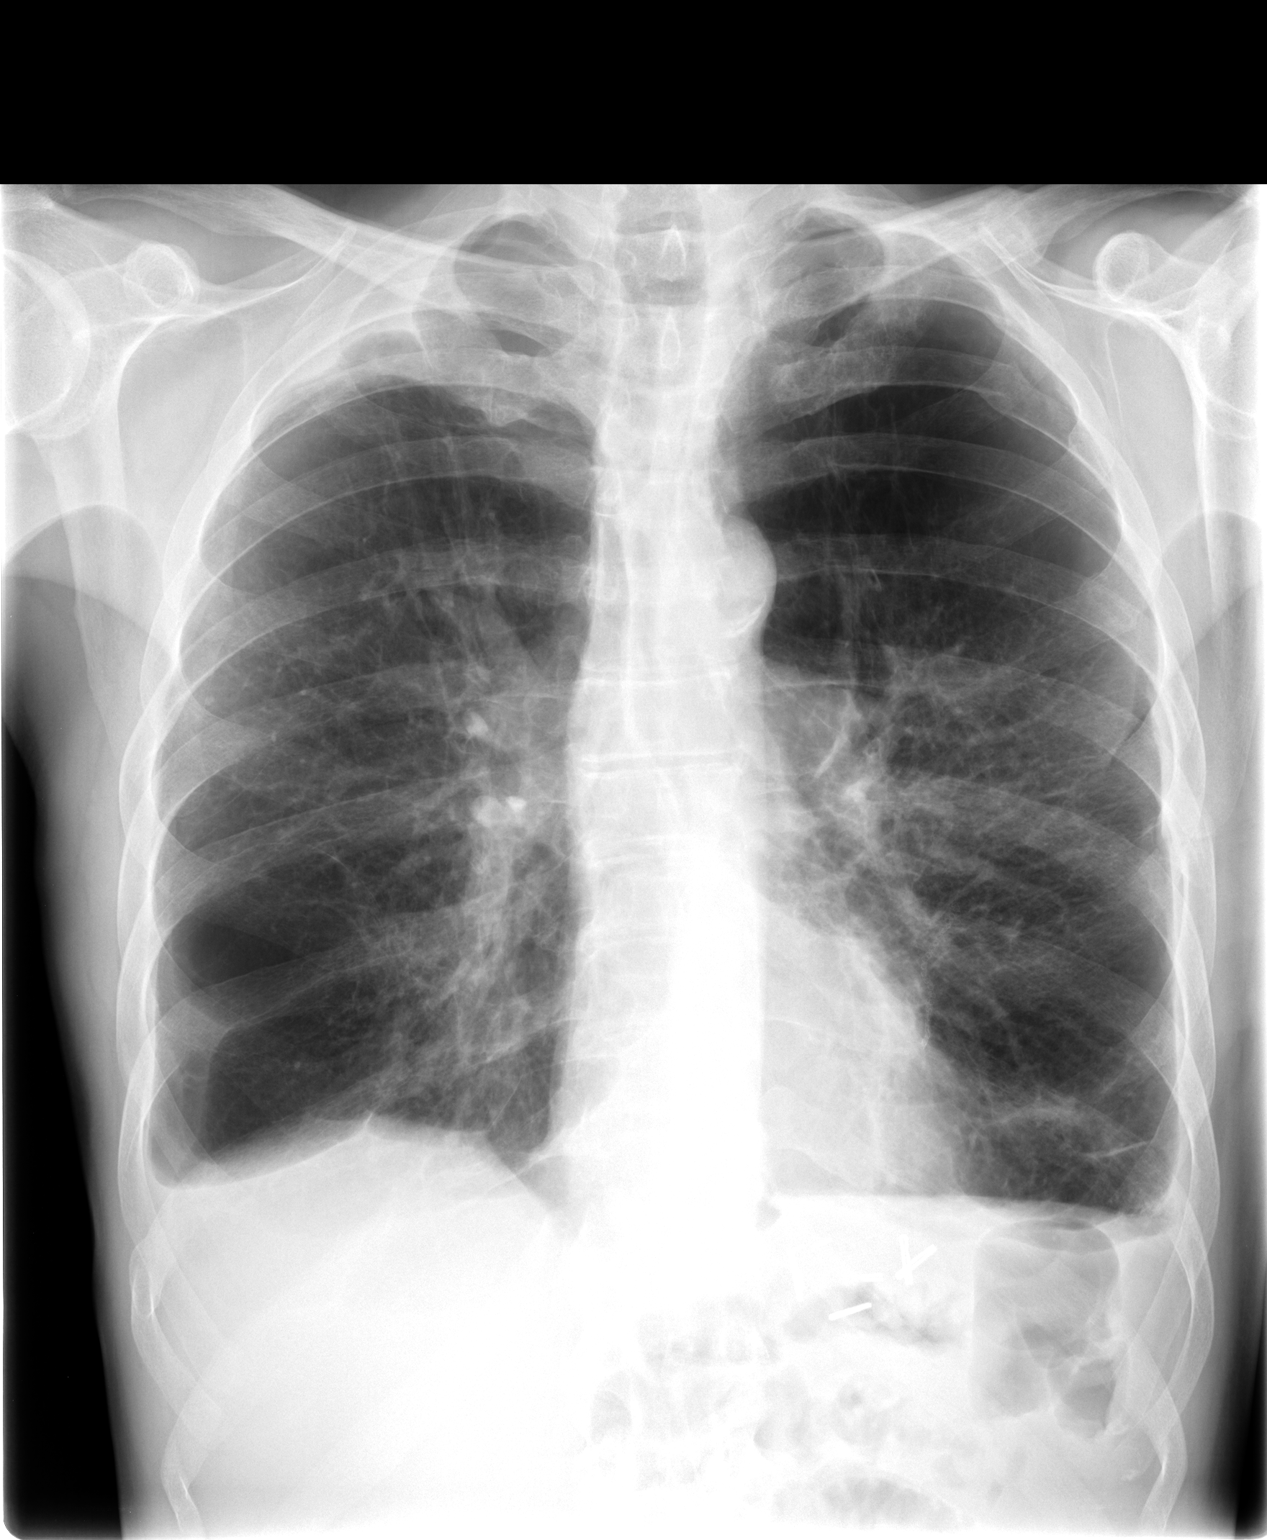

[view not recorded (2 of 2)]
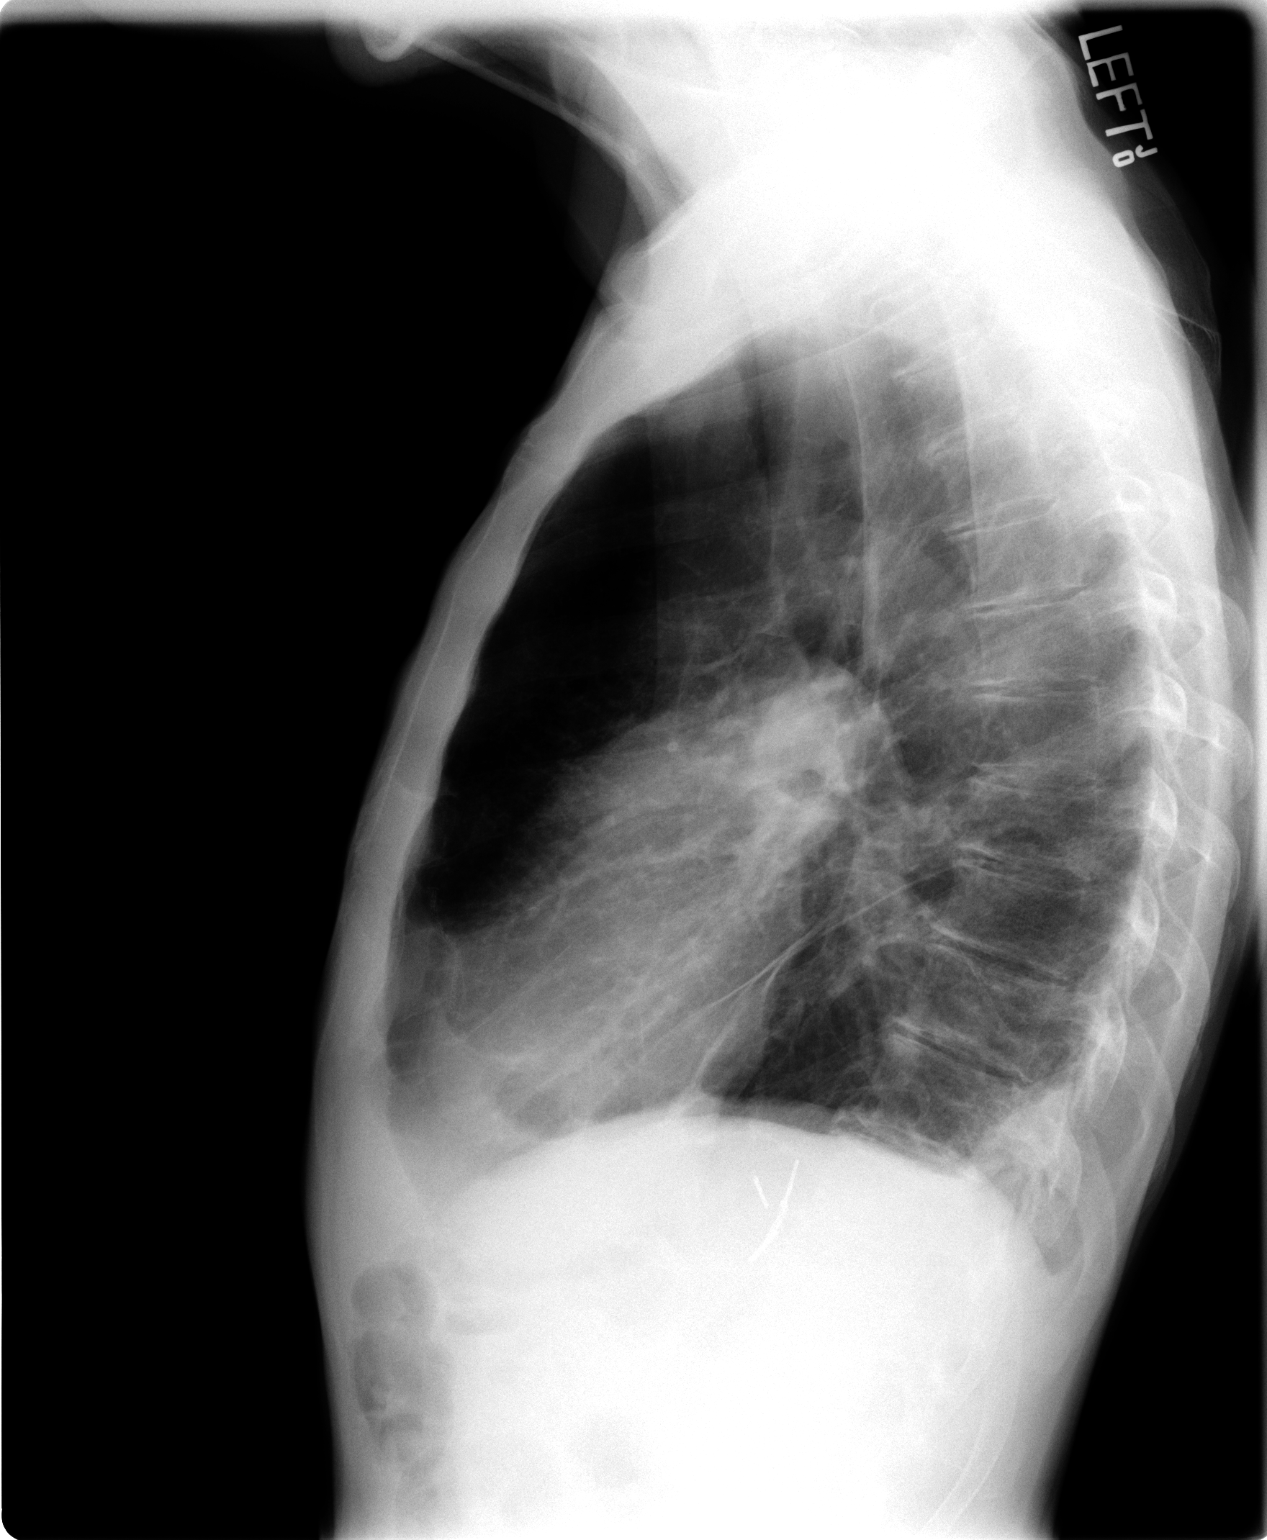

[2 of 2 positions shown; findings below may reference images not displayed]

FINDINGS: No pneumothorax is seen.  Again severe changes of COPD
are noted with diffuse bullous formation. Areas of pulmonary
fibrosis are noted bilaterally.  There may still a tiny pleural
effusions present.  Heart size is stable.  An old healed left mid
clavicular fracture is noted.  Right apical pleuroparenchymal
scarring appears stable.
IMPRESSION: No pneumothorax.  COPD.  Probable tiny pleural effusions.

## 2012-08-31 ENCOUNTER — Other Ambulatory Visit: Payer: Self-pay | Admitting: Adult Health

## 2012-08-31 MED ORDER — TRAMADOL HCL 50 MG PO TABS: 50.0000 mg | ORAL_TABLET | Freq: Four times a day (QID) | ORAL | Status: DC | PRN

## 2012-08-31 NOTE — Telephone Encounter (Signed)
Received faxed refill request from [facility] for tramadol 50mg  q6hours prn Per 4.7.14 ov w/ TP: Patient Instructions    Continue Symbicort and Spiriva  Work on stopping smoking -goal is to be down to 4 cigs daily on return .  Begin Ensure plus Twice daily Between meals.  May use Tramadol 50mg  every 6 hr As needed Pain.  Warm heat to ribs As needed  Follow up with Dr. Craige Cotta In 6 weeks And As needed  Please contact office for sooner follow up if symptoms do not improve or worsen or seek emergency care    Refills sent to pharmacy

## 2012-09-05 ENCOUNTER — Encounter: Payer: Self-pay | Admitting: *Deleted

## 2012-09-05 ENCOUNTER — Encounter: Payer: Medicare Other | Admitting: Nurse Practitioner

## 2012-09-07 ENCOUNTER — Other Ambulatory Visit: Payer: Self-pay | Admitting: *Deleted

## 2012-09-18 ENCOUNTER — Ambulatory Visit (INDEPENDENT_AMBULATORY_CARE_PROVIDER_SITE_OTHER): Payer: Medicare Other | Admitting: Nurse Practitioner

## 2012-09-18 ENCOUNTER — Encounter: Payer: Self-pay | Admitting: Nurse Practitioner

## 2012-09-18 VITALS — BP 110/66 | HR 72 | Temp 98.6°F | Resp 22 | Ht 68.5 in | Wt 106.4 lb

## 2012-09-18 DIAGNOSIS — F1010 Alcohol abuse, uncomplicated: Secondary | ICD-10-CM

## 2012-09-18 DIAGNOSIS — R634 Abnormal weight loss: Secondary | ICD-10-CM

## 2012-09-18 DIAGNOSIS — L989 Disorder of the skin and subcutaneous tissue, unspecified: Secondary | ICD-10-CM

## 2012-09-18 DIAGNOSIS — Z720 Tobacco use: Secondary | ICD-10-CM

## 2012-09-18 DIAGNOSIS — F17200 Nicotine dependence, unspecified, uncomplicated: Secondary | ICD-10-CM

## 2012-09-18 DIAGNOSIS — E43 Unspecified severe protein-calorie malnutrition: Secondary | ICD-10-CM

## 2012-09-18 DIAGNOSIS — J438 Other emphysema: Secondary | ICD-10-CM

## 2012-09-18 DIAGNOSIS — J439 Emphysema, unspecified: Secondary | ICD-10-CM

## 2012-09-18 MED ORDER — MIRTAZAPINE 15 MG PO TABS: 15.0000 mg | ORAL_TABLET | Freq: Every day | ORAL | Status: DC

## 2012-09-18 NOTE — Progress Notes (Signed)
Patient ID: Philip Coleman, male   DOB: 01-Sep-1955, 57 y.o.   MRN: [id number]  Allergies  Allergen Reactions  . Codeine Hives and Nausea And Vomiting    Chief Complaint  Patient presents with  . Annual Exam    check right side/forehead  . Physcial theray (pulmology rehad)    HPI: Patient is a 57 y.o. male seen in the office today for EV Main problem is his COPD. Reports he is medications as prescribed; is needing albuterol frequently; reports taking albuterol 2 puffs 4-5 times daily.  Protein- calorie malnutrition- conts to lose weight; ensure has gone up in price- [facility] working with him on ways to afford this    Lesion on top of head that started small and now has increased in size; itches   Review of Systems:  Review of Systems  Constitutional: Positive for weight loss (1 lbs in 2 month however pt is already mal-nurished ). Negative for fever and chills.  HENT: Negative for congestion, sore throat and tinnitus.   Eyes: Negative for blurred vision and pain.  Respiratory: Positive for cough and sputum production (no change in color or consistency).   Cardiovascular: Negative for chest pain, palpitations and PND.       Chest wall tenderness  Gastrointestinal: Negative for heartburn, nausea, vomiting, abdominal pain, diarrhea and constipation.  Genitourinary: Negative for dysuria, urgency and frequency.       2 or 3 times at night to urinate   Musculoskeletal: Negative for myalgias, back pain, joint pain and falls.  Skin: Negative for itching and rash.       Dry   Neurological: Negative for dizziness, tingling, weakness and headaches.  Endo/Heme/Allergies: Positive for environmental allergies.  Psychiatric/Behavioral: The patient is nervous/anxious and has insomnia.      Past Medical History  Diagnosis Date  . Thyroid disease     hyper, not on meds  . COPD (chronic obstructive pulmonary disease)   . Hodgkin's disease(201) 1993    with abdominal surgical scar -pt unable to  describe   . Spontaneous pneumothorax     left  . Spontaneous pneumothorax     right  . Pneumonia   . Chronic respiratory failure   . Anxiety state, unspecified   . Depressive disorder, not elsewhere classified   . Chronic airway obstruction, not elsewhere classified   . Unspecified constipation   . Lumbago   . Other malaise and fatigue   . Headache   . Other dyspnea and respiratory abnormality   . Cough   . Diarrhea    Past Surgical History  Procedure Laterality Date  . Splenectomy  1987  . Chest tube insertion      left  . Chest tube insertion      right  . Teeth extracted for dentures  2012   Social History:   reports that he has been smoking Cigarettes.  He has a 10.25 pack-year smoking history. He does not have any smokeless tobacco history on file. He reports that he drinks about 3.6 ounces of alcohol per week. He reports that he does not use illicit drugs.  Family History  Problem Relation Age of Onset  . Cancer Mother 73  . Pneumonia Father   . Cancer Sister     Medications: Patient's Medications  New Prescriptions   MIRTAZAPINE (REMERON) 15 MG TABLET    Take 1 tablet (15 mg total) by mouth at bedtime.  Previous Medications   ALBUTEROL (PROVENTIL HFA;VENTOLIN HFA) 108 (90 BASE) MCG/ACT  INHALER    Inhale 2 puffs into the lungs every 6 (six) hours as needed. wheezing   ATORVASTATIN (LIPITOR) 40 MG TABLET       BUDESONIDE-FORMOTEROL (SYMBICORT) 160-4.5 MCG/ACT INHALER    Inhale 2 puffs into the lungs 2 (two) times daily.   IBUPROFEN (ADVIL,MOTRIN) 600 MG TABLET       TIOTROPIUM (SPIRIVA) 18 MCG INHALATION CAPSULE    Place 1 capsule (18 mcg total) into inhaler and inhale daily.   TRAMADOL (ULTRAM) 50 MG TABLET    Take 1 tablet (50 mg total) by mouth every 6 (six) hours as needed.  Modified Medications   No medications on file  Discontinued Medications   No medications on file     Physical Exam: Physical Exam  Constitutional: He is oriented to person,  place, and time. He appears cachectic. He appears ill.  HENT:  Head: Normocephalic and atraumatic.  Right Ear: External ear normal.  Left Ear: External ear normal.  Nose: Nose normal.  Mouth/Throat: Oropharynx is clear and moist.  Eyes: EOM are normal. Pupils are equal, round, and reactive to light.  Neck: Normal range of motion. No JVD present. No tracheal deviation present. No thyromegaly present.  Cardiovascular: Normal rate, regular rhythm and normal heart sounds.   Pulmonary/Chest: Effort normal.  Decreased breath sounds throughout   Abdominal: Soft. Bowel sounds are normal. He exhibits no distension. There is no tenderness.  Genitourinary: Rectum normal. Guaiac negative stool.  Musculoskeletal: Normal range of motion.  Lymphadenopathy:    He has no cervical adenopathy.  Neurological: He is alert and oriented to person, place, and time.  Skin: Skin is warm and dry.  Flat Irregular lesion with scaly features to right upper forehead   Psychiatric: He has a normal mood and affect.    Filed Vitals:   09/18/12 1450  BP: 110/66  Pulse: 72  Temp: 98.6 F (37 C)  Resp: 22  Height: 5' 8.5" (1.74 m)  Weight: 106 lb 6.4 oz (48.263 kg)  SpO2: 95%      Labs reviewed: Basic Metabolic Panel:  Recent Labs  16/10/96 0145  04/01/12 0624 04/02/12 0520 04/03/12 0502  NA 138  < > 137 133* 138  K 4.0  < > 3.6 3.9 3.8  CL 101  < > 97 98 100  CO2 28  < > 29 29 32  GLUCOSE 135*  < > 122* 145* 130*  BUN 15  < > 11 11 15   CREATININE 0.70  < > 0.68 0.55 0.57  CALCIUM 9.2  < > 9.6 8.9 9.0  TSH 1.917  --   --   --   --   < > = values in this interval not displayed. Liver Function Tests:  Recent Labs  03/30/12 2140 04/01/12 0624 04/02/12 0520  AST 19 32 51*  ALT 12 19 31   ALKPHOS 80 86 80  BILITOT 0.5 0.5 0.3  PROT 6.7 7.5 6.2  ALBUMIN 3.5 3.8 2.9*   No results found for this basename: LIPASE, AMYLASE,  in the last 8760 hours No results found for this basename: AMMONIA,   in the last 8760 hours CBC:  Recent Labs  12/18/11 1546  01/09/12 1630  03/30/12 2140 04/01/12 0624 04/02/12 0520 04/03/12 0502  WBC 16.3*  < > 14.1*  < > 18.2* 24.3* 16.1* 15.0*  NEUTROABS 10.4*  --  9.6*  --  16.4*  --   --   --   HGB 15.8  < > 15.5  < >  14.7 15.5 13.2 13.2  HCT 45.8  < > 46.8  < > 43.5 47.2 41.0 39.7  MCV 88.4  < > 89.1  < > 87.7 89.9 90.3 90.4  PLT 279  < > 321  < > 304 341 315 332  < > = values in this interval not displayed. Lipid Panel:  Recent Labs  04/02/12 0520  CHOL 143  HDL 70  LDLCALC 62  TRIG 57  CHOLHDL 2.0     Assessment/Plan Tobacco abuse Currently smoking 4-5 cigarettes a day. Reports he is interested in cessation. [facility] case manager has given him information regarding classes Previously has tried nicotine patch at the beginning of the year and they did not help. Trying to decrease cigarettes. Encouraged him to go to classes    COPD with emphysema Cont following with pulmonary- Albuterol is a rescue inhaler only- 2 puffs q 6 hours as needed for shortness of breath or wheezing Cont to cut back on cigarettes   Protein-calorie malnutrition, severe Will start pt on Remeron 15 mg q hs Cont ensure supplements   ETOH abuse encouraged to decrease ETOH use  lesion on forehead Will consult dermatology   Preventative  Needing immunization records Will get abdominal US  colonoscopy is currently up to date Will need screening PSA with next lab draw

## 2012-09-18 NOTE — Assessment & Plan Note (Addendum)
Currently smoking 4-5 cigarettes a day. Reports he is interested in cessation. [facility] case manager has given him information regarding classes Previously has tried nicotine patch at the beginning of the year and they did not help. Trying to decrease cigarettes. Encouraged him to go to classes

## 2012-09-19 NOTE — Assessment & Plan Note (Signed)
Will start pt on Remeron 15 mg q hs Cont ensure supplements

## 2012-09-19 NOTE — Assessment & Plan Note (Signed)
encouraged to decrease ETOH use

## 2012-09-19 NOTE — Assessment & Plan Note (Signed)
Cont following with pulmonary- Albuterol is a rescue inhaler only- 2 puffs q 6 hours as needed for shortness of breath or wheezing Cont to cut back on cigarettes

## 2012-09-24 ENCOUNTER — Other Ambulatory Visit: Payer: Medicare Other

## 2012-09-24 ENCOUNTER — Ambulatory Visit (INDEPENDENT_AMBULATORY_CARE_PROVIDER_SITE_OTHER)
Admission: RE | Admit: 2012-09-24 | Discharge: 2012-09-24 | Disposition: A | Discharge status: Home / Self Care | Payer: Medicare Other | Source: Ambulatory Visit | Attending: Pulmonary Disease | Admitting: Pulmonary Disease

## 2012-09-24 ENCOUNTER — Encounter: Payer: Self-pay | Admitting: Pulmonary Disease

## 2012-09-24 ENCOUNTER — Ambulatory Visit (INDEPENDENT_AMBULATORY_CARE_PROVIDER_SITE_OTHER): Payer: Medicare Other | Admitting: Pulmonary Disease

## 2012-09-24 ENCOUNTER — Telehealth: Payer: Self-pay | Admitting: Pulmonary Disease

## 2012-09-24 VITALS — BP 128/80 | HR 82 | Temp 98.7°F | Ht 67.0 in | Wt 110.4 lb

## 2012-09-24 DIAGNOSIS — J439 Emphysema, unspecified: Secondary | ICD-10-CM

## 2012-09-24 DIAGNOSIS — J438 Other emphysema: Secondary | ICD-10-CM

## 2012-09-24 DIAGNOSIS — I2510 Atherosclerotic heart disease of native coronary artery without angina pectoris: Secondary | ICD-10-CM | POA: Insufficient documentation

## 2012-09-24 DIAGNOSIS — F17200 Nicotine dependence, unspecified, uncomplicated: Secondary | ICD-10-CM

## 2012-09-24 DIAGNOSIS — Z720 Tobacco use: Secondary | ICD-10-CM

## 2012-09-24 DIAGNOSIS — R0789 Other chest pain: Secondary | ICD-10-CM

## 2012-09-24 DIAGNOSIS — J9610 Chronic respiratory failure, unspecified whether with hypoxia or hypercapnia: Secondary | ICD-10-CM

## 2012-09-24 MED ORDER — TRAMADOL HCL 50 MG PO TABS: 100.0000 mg | ORAL_TABLET | Freq: Four times a day (QID) | ORAL | Status: DC | PRN

## 2012-09-24 NOTE — Addendum Note (Signed)
Addended by: Nita Sells on: 09/24/2012 03:45 PM   Modules accepted: Orders

## 2012-09-24 NOTE — Assessment & Plan Note (Signed)
Encourage to continue his smoking cessation efforts.

## 2012-09-24 NOTE — Patient Instructions (Signed)
Chest xray today Lab test today Will schedule breathing test (PFT) Will arrange for referral to pulmonary rehab Follow up in 4 months

## 2012-09-24 NOTE — Assessment & Plan Note (Signed)
I explained to him the rationale for using supplemental oxygen and importance of using oxygen with activities.

## 2012-09-24 NOTE — Progress Notes (Signed)
Chief Complaint  Patient presents with  . Follow-up    Pt states has good and bad days with his breahting--he is still hurting across chest first thing in morning and w deep breathing or laying body diff positions    History of Present Illness: Philip Coleman is a 57 y.o. male smoker with severe COPD and chronic hypoxic respiratory failure.  His breathing has been stable.  He has occasional cough with clear sputum.  He denies fever or hemoptysis.  He gets winded with activity.  He uses his albuterol several times per week.  He continues to have left sided chest discomfort, and says his arm feels heavy sometimes when this happens.  He denies leg swelling.  TESTS: CT chest 04/01/12>>advanced emphysema, rt apical scarring Echo 04/02/12>>EF 55%  He  has a past medical history of Thyroid disease; COPD (chronic obstructive pulmonary disease); Hodgkin's disease(201) (1993); Spontaneous pneumothorax; Spontaneous pneumothorax; Pneumonia; Chronic respiratory failure; Anxiety state, unspecified; Depressive disorder, not elsewhere classified; Chronic airway obstruction, not elsewhere classified; Unspecified constipation; Lumbago; Other malaise and fatigue; Headache; Other dyspnea and respiratory abnormality; Cough; and Diarrhea.  He  has past surgical history that includes Splenectomy (1987); Chest tube insertion; Chest tube insertion; and teeth extracted for dentures (2012).  Outpatient Encounter Prescriptions as of 09/24/2012  Medication Sig Dispense Refill  . albuterol (PROVENTIL HFA;VENTOLIN HFA) 108 (90 BASE) MCG/ACT inhaler Inhale 2 puffs into the lungs every 6 (six) hours as needed. wheezing  1 Inhaler  2  . atorvastatin (LIPITOR) 40 MG tablet       . budesonide-formoterol (SYMBICORT) 160-4.5 MCG/ACT inhaler Inhale 2 puffs into the lungs 2 (two) times daily.      [name] ibuprofen (ADVIL,MOTRIN) 600 MG tablet       . mirtazapine (REMERON) 15 MG tablet Take 1 tablet (15 mg total) by mouth at bedtime.   30 tablet  3  . tiotropium (SPIRIVA) 18 MCG inhalation capsule Place 1 capsule (18 mcg total) into inhaler and inhale daily.  30 capsule  1  . traMADol (ULTRAM) 50 MG tablet Take 1 tablet (50 mg total) by mouth every 6 (six) hours as needed.  30 tablet  1   No facility-administered encounter medications on file as of 09/24/2012.    Allergies  Allergen Reactions  . Codeine Hives and Nausea And Vomiting    Physical Exam:  General - Thin ENT - No sinus tenderness, no oral exudate, no LAN Cardiac - s1s2 regular, no murmur Chest - Decreased breath sounds, scattered rhonchi, no wheeze Back - No focal tenderness Abd - Soft, non-tender Ext - No edema Neuro - Normal strength Skin - No rashes Psych - agitated at times   Assessment/Plan:  [name], MD [facility] Pager:  [phone] 09/24/2012, 2:59 PM

## 2012-09-24 NOTE — Assessment & Plan Note (Signed)
Stable on his current inhaler regimen.  Will arrange for PFT, A1AT level, and pulmonary rehab referral.

## 2012-09-24 NOTE — Telephone Encounter (Signed)
Dg Chest 2 View  09/24/2012   *RADIOLOGY REPORT*  Clinical Data: Chronic cough.  CHEST - 2 VIEW  Comparison: 08/13/2012.  Findings: No significant interval changes.  Again noted are extensive changes of bullous emphysema and chronic interstitial prominence and fibrotic changes.  No evidence of superimposed infiltrate, edema or masses.  No effusions or pneumothoraces.  No acute bony changes.  The heart, mediastinum and hilar contours are normal.  IMPRESSION: Advanced changes of emphysema.  No acute findings.   Original Report Authenticated By: Sander Radon, M.D.    Will have my nurse inform pt that CXR shows chronic changes from emphysema and COPD.  No findings to explain his chest pain symptoms.  He should speak with his primary physician if his chest pain symptoms persist.

## 2012-09-24 NOTE — Assessment & Plan Note (Addendum)
Repeat chest xray today.  Explained that if this is stable, then he may need further assessment with PCP for cardiac or GI cause of his chest pain.  Will also increase his tramadol to 100 mg q6h prn.

## 2012-09-25 NOTE — Telephone Encounter (Signed)
atc pt. He answered but then phone was hung up. Called back lmtcb x1

## 2012-09-26 NOTE — Telephone Encounter (Signed)
Pt states he is returning Mindy's call & asked to be reached at [phone].  Antionette Fairy

## 2012-09-26 NOTE — Telephone Encounter (Signed)
I spoke with patient about results and he verbalized understanding and had no questions 

## 2012-09-26 NOTE — Telephone Encounter (Signed)
Returning call can be reached at [phone].Philip Coleman

## 2012-09-27 ENCOUNTER — Other Ambulatory Visit: Payer: Self-pay | Admitting: Adult Health

## 2012-09-27 MED ORDER — TRAMADOL HCL 50 MG PO TABS: 100.0000 mg | ORAL_TABLET | Freq: Four times a day (QID) | ORAL | Status: DC | PRN

## 2012-09-27 NOTE — Telephone Encounter (Signed)
Pt saw TP 4.7.14: Patient Instructions    Continue Symbicort and Spiriva  Work on stopping smoking -goal is to be down to 4 cigs daily on return .  Begin Ensure plus Twice daily Between meals.  May use Tramadol 50mg  every 6 hr As needed Pain.  Warm heat to ribs As needed  Follow up with Dr. Craige Cotta In 6 weeks And As needed  Please contact office for sooner follow up if symptoms do not improve or worsen or seek emergency care    Received faxed refill request from [facility] request 6 months refills on tramadol 50mg  1 q6h prn Per TP: ok for 3 refills only Rx faxed back to pharmacy at [phone] Med list updated Rx sent to scan Will sign off

## 2012-09-28 LAB — ALPHA-1 ANTITRYPSIN PHENOTYPE: A-1 Antitrypsin: 176 mg/dL (ref 83–199)

## 2012-10-03 ENCOUNTER — Telehealth: Payer: Self-pay | Admitting: Pulmonary Disease

## 2012-10-03 NOTE — Telephone Encounter (Signed)
Notes Recorded by Barbaraann Share, MD on 09/29/2012 at 2:36 PM Please let pt know that blood test for hereditary emphysema is normal   Pt aware of normal results.

## 2012-10-18 ENCOUNTER — Ambulatory Visit (INDEPENDENT_AMBULATORY_CARE_PROVIDER_SITE_OTHER): Payer: Medicare Other | Admitting: Pulmonary Disease

## 2012-10-18 DIAGNOSIS — J439 Emphysema, unspecified: Secondary | ICD-10-CM

## 2012-10-18 DIAGNOSIS — J438 Other emphysema: Secondary | ICD-10-CM

## 2012-10-18 LAB — PULMONARY FUNCTION TEST

## 2012-10-18 NOTE — Progress Notes (Signed)
Quick Note:  Per phone msg from 10/03/12, pt returned call and was informed of results. ______

## 2012-10-18 NOTE — Progress Notes (Signed)
PFT done today. 

## 2012-10-24 ENCOUNTER — Telehealth: Payer: Self-pay | Admitting: Pulmonary Disease

## 2012-10-24 NOTE — Telephone Encounter (Signed)
PFT 10/18/12 >> FEV1 0.62 (17%), FEV1% 34, TLC 6.46 (98%), RV 2.56 (256%), DLCO 27%, no BD response.  Will have my nurse inform pt that PFT shows expected changes from severe COPD with emphysema.  No change to current treatment plan.  Will discuss in more detail at next ROV.

## 2012-10-25 NOTE — Telephone Encounter (Signed)
Returning call.Philip Coleman ° °

## 2012-10-25 NOTE — Telephone Encounter (Signed)
Returning call can be reached at [phone].Philip Coleman ° °

## 2012-10-25 NOTE — Telephone Encounter (Signed)
I spoke with patient about results and he verbalized understanding and had no questions 

## 2012-10-25 NOTE — Telephone Encounter (Signed)
LMTCB

## 2012-10-30 ENCOUNTER — Encounter: Payer: Self-pay | Admitting: Pulmonary Disease

## 2012-10-31 ENCOUNTER — Ambulatory Visit: Payer: Self-pay | Admitting: Nurse Practitioner

## 2012-11-14 ENCOUNTER — Ambulatory Visit: Payer: Self-pay | Admitting: Nurse Practitioner

## 2012-11-14 ENCOUNTER — Encounter: Payer: Self-pay | Admitting: *Deleted

## 2012-11-15 ENCOUNTER — Other Ambulatory Visit: Payer: Self-pay | Admitting: *Deleted

## 2012-11-15 ENCOUNTER — Other Ambulatory Visit: Payer: Self-pay | Admitting: Nurse Practitioner

## 2012-11-15 ENCOUNTER — Encounter: Payer: Self-pay | Admitting: Nurse Practitioner

## 2012-11-15 ENCOUNTER — Ambulatory Visit (INDEPENDENT_AMBULATORY_CARE_PROVIDER_SITE_OTHER): Payer: Medicare Other | Admitting: Nurse Practitioner

## 2012-11-15 VITALS — BP 126/84 | HR 73 | Temp 97.6°F | Resp 14 | Ht 67.0 in | Wt 108.0 lb

## 2012-11-15 DIAGNOSIS — M25512 Pain in left shoulder: Secondary | ICD-10-CM

## 2012-11-15 DIAGNOSIS — R634 Abnormal weight loss: Secondary | ICD-10-CM

## 2012-11-15 DIAGNOSIS — G894 Chronic pain syndrome: Secondary | ICD-10-CM

## 2012-11-15 DIAGNOSIS — J438 Other emphysema: Secondary | ICD-10-CM

## 2012-11-15 DIAGNOSIS — J439 Emphysema, unspecified: Secondary | ICD-10-CM

## 2012-11-15 DIAGNOSIS — E43 Unspecified severe protein-calorie malnutrition: Secondary | ICD-10-CM

## 2012-11-15 DIAGNOSIS — M25519 Pain in unspecified shoulder: Secondary | ICD-10-CM

## 2012-11-15 NOTE — Progress Notes (Signed)
Patient ID: Philippa Chester, male   DOB: 1955/12/06, 57 y.o.   MRN: [id number]   Allergies  Allergen Reactions  . Codeine Hives and Nausea And Vomiting    Chief Complaint  Patient presents with  . Medical Managment of Chronic Issues    HPI: Patient is a 57 y.o. male seen in the office today for follow up on weight loss, COPD, tobacco and ETOH abuse Has been following with pulmonary and reports his breathing has been doing well- using albuterol only 3 times a day at most. Reports he has cut back to 3 cigarettes daily   Is eating 5-6 small meals a days- can not afford ensures- tries to eat breakfast, sometimes eats lunch, and microwave dinner because he does not have a stove  Has dermatology appt on July 27th  Ongoing pain in left shoulder for over a year- since surgery in may - keeping him awake at night and wakes him up hurting; able to move arm and rotate shoulder- full ROM just painful   Pt would like to be tested for HIV- reports he is bisexual and with ongoing weight loss if it is not covered here he will go to the health department  Review of Systems:  Review of Systems  Constitutional: Positive for weight loss. Negative for fever and chills.  Respiratory: Positive for cough and shortness of breath (improved). Negative for wheezing.   Cardiovascular: Negative for chest pain, palpitations and leg swelling.  Gastrointestinal: Negative for heartburn, diarrhea and constipation.  Genitourinary: Negative for dysuria, urgency and frequency.  Musculoskeletal: Positive for joint pain (ongoing shoulder pain- has xrays done but no intervention).  Neurological: Negative for dizziness and headaches.     Past Medical History  Diagnosis Date  . Thyroid disease     hyper, not on meds  . COPD (chronic obstructive pulmonary disease)   . Hodgkin's disease 1993    with abdominal surgical scar -pt unable to describe   . Spontaneous pneumothorax     left  . Spontaneous pneumothorax     right   . Pneumonia   . Chronic respiratory failure   . Anxiety state, unspecified   . Depressive disorder, not elsewhere classified   . Chronic airway obstruction, not elsewhere classified   . Unspecified constipation   . Lumbago   . Other malaise and fatigue   . Headache(784.0)   . Other dyspnea and respiratory abnormality   . Cough   . Diarrhea    Past Surgical History  Procedure Laterality Date  . Splenectomy  1987  . Chest tube insertion      left  . Chest tube insertion      right  . Teeth extracted for dentures  2012   Social History:   reports that he has been smoking Cigarettes.  He has a 10.25 pack-year smoking history. He has never used smokeless tobacco. He reports that he drinks about 3.6 ounces of alcohol per week. He reports that he does not use illicit drugs.  Family History  Problem Relation Age of Onset  . Cancer Mother 31  . Pneumonia Father   . Cancer Sister     Medications: Patient's Medications  New Prescriptions   No medications on file  Previous Medications   ALBUTEROL (PROVENTIL HFA;VENTOLIN HFA) 108 (90 BASE) MCG/ACT INHALER    Inhale 2 puffs into the lungs every 6 (six) hours as needed. wheezing   ATORVASTATIN (LIPITOR) 40 MG TABLET       BUDESONIDE-FORMOTEROL (SYMBICORT)  160-4.5 MCG/ACT INHALER    Inhale 2 puffs into the lungs 2 (two) times daily.   IBUPROFEN (ADVIL,MOTRIN) 600 MG TABLET       MIRTAZAPINE (REMERON) 15 MG TABLET    Take 1 tablet (15 mg total) by mouth at bedtime.   TIOTROPIUM (SPIRIVA) 18 MCG INHALATION CAPSULE    Place 1 capsule (18 mcg total) into inhaler and inhale daily.   TRAMADOL (ULTRAM) 50 MG TABLET    Take 2 tablets (100 mg total) by mouth every 6 (six) hours as needed for pain.  Modified Medications   No medications on file  Discontinued Medications   No medications on file     Physical Exam:  Filed Vitals:   11/15/12 1021  BP: 126/84  Pulse: 73  Temp: 97.6 F (36.4 C)  TempSrc: Oral  Resp: 14  Height: 5\' 7"   (1.702 m)  Weight: 108 lb (48.988 kg)  SpO2: 98%    Physical Exam  Vitals reviewed. Constitutional: He is oriented to person, place, and time. No distress.  Thin male in NAD  HENT:  Head: Normocephalic and atraumatic.  Eyes: Conjunctivae and EOM are normal. Pupils are equal, round, and reactive to light.  Neck: Normal range of motion. Neck supple. No thyromegaly present.  Cardiovascular: Normal rate, regular rhythm and normal heart sounds.   Pulmonary/Chest: Effort normal and breath sounds normal. No respiratory distress.  Abdominal: Soft. Bowel sounds are normal. He exhibits no distension.  Musculoskeletal: Normal range of motion. He exhibits no edema and no tenderness.  Neurological: He is alert and oriented to person, place, and time.  Skin: Skin is warm and dry. He is not diaphoretic.  Psychiatric: Affect normal.     Labs reviewed: Basic Metabolic Panel:  Recent Labs  21/30/86 0145  04/01/12 0624 04/02/12 0520 04/03/12 0502  NA 138  < > 137 133* 138  K 4.0  < > 3.6 3.9 3.8  CL 101  < > 97 98 100  CO2 28  < > 29 29 32  GLUCOSE 135*  < > 122* 145* 130*  BUN 15  < > 11 11 15   CREATININE 0.70  < > 0.68 0.55 0.57  CALCIUM 9.2  < > 9.6 8.9 9.0  TSH 1.917  --   --   --   --   < > = values in this interval not displayed. Liver Function Tests:  Recent Labs  03/30/12 2140 04/01/12 0624 04/02/12 0520  AST 19 32 51*  ALT 12 19 31   ALKPHOS 80 86 80  BILITOT 0.5 0.5 0.3  PROT 6.7 7.5 6.2  ALBUMIN 3.5 3.8 2.9*   No results found for this basename: LIPASE, AMYLASE,  in the last 8760 hours No results found for this basename: AMMONIA,  in the last 8760 hours CBC:  Recent Labs  12/18/11 1546  01/09/12 1630  03/30/12 2140 04/01/12 0624 04/02/12 0520 04/03/12 0502  WBC 16.3*  < > 14.1*  < > 18.2* 24.3* 16.1* 15.0*  NEUTROABS 10.4*  --  9.6*  --  16.4*  --   --   --   HGB 15.8  < > 15.5  < > 14.7 15.5 13.2 13.2  HCT 45.8  < > 46.8  < > 43.5 47.2 41.0 39.7  MCV  88.4  < > 89.1  < > 87.7 89.9 90.3 90.4  PLT 279  < > 321  < > 304 341 315 332  < > = values in this interval not  displayed. Lipid Panel:  Recent Labs  04/02/12 0520  CHOL 143  HDL 70  LDLCALC 62  TRIG 57  CHOLHDL 2.0    Past Procedures:     Assessment/Plan   1.   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   Protein-calorie malnutrition, severe 262   - ongoing- encouraged to cont ensures with 3 meals a day and snacks throughout the day   3.   Pain in joint, shoulder region, left 719.41 - will refer to orthopedics at this time . Celebrex 200 mg daily samples given for ~2 week supply    4.   Loss of weight - to cont remeron   Labs/tests ordered Cbc and cmp   To follow up in 2 months with Glade Lloyd MD

## 2012-11-15 NOTE — Patient Instructions (Addendum)
Follow up with Dr Glade Lloyd in 2 months  Samples given of celebrex take 1 daily for pain   Will get Ortho appt for evaluation and treatment of shoulder pain

## 2012-11-16 LAB — CBC WITH DIFFERENTIAL
Eos: 5 % (ref 0–5)
Immature Grans (Abs): 0 10*3/uL (ref 0.0–0.1)
Immature Granulocytes: 0 % (ref 0–2)
Lymphocytes Absolute: 5.2 10*3/uL — ABNORMAL HIGH (ref 0.7–3.1)
Lymphs: 37 % (ref 14–46)
MCV: 86 fL (ref 79–97)
Monocytes: 12 % (ref 4–12)
Platelets: 337 10*3/uL (ref 150–379)
RBC: 4.83 x10E6/uL (ref 4.14–5.80)
WBC: 14 10*3/uL — ABNORMAL HIGH (ref 3.4–10.8)

## 2012-11-16 LAB — COMPREHENSIVE METABOLIC PANEL
ALT: 19 IU/L (ref 0–44)
Albumin/Globulin Ratio: 1.7 (ref 1.1–2.5)
CO2: 26 mmol/L (ref 18–29)
Calcium: 9.8 mg/dL (ref 8.7–10.2)
Creatinine, Ser: 0.94 mg/dL (ref 0.76–1.27)
GFR calc non Af Amer: 91 mL/min/{1.73_m2} (ref >59)
Globulin, Total: 2.7 g/dL (ref 1.5–4.5)
Glucose: 86 mg/dL (ref 65–99)
Potassium: 4.6 mmol/L (ref 3.5–5.2)
Total Protein: 7.3 g/dL (ref 6.0–8.5)

## 2012-11-16 NOTE — Progress Notes (Signed)
Patient aware.

## 2012-11-19 LAB — 726778 7+ALC-UNBUND
Barbiturate Quant, Ur: NEGATIVE ng/mL
Benzodiazepine Quant, Ur: NEGATIVE ng/mL
Cannabinoid Quant, Ur: NEGATIVE ng/mL
PCP Quant, Ur: NEGATIVE ng/mL

## 2012-11-27 IMAGING — CR DG CHEST 2V
2 series · 2 of 2 positions shown · non-contrast
Comparison: Chest CT and radiographs 12/24/2011.

CLINICAL DATA: Shortness of breath with cough.  History of COPD.

CHEST - 2 VIEW

[w chest pa]
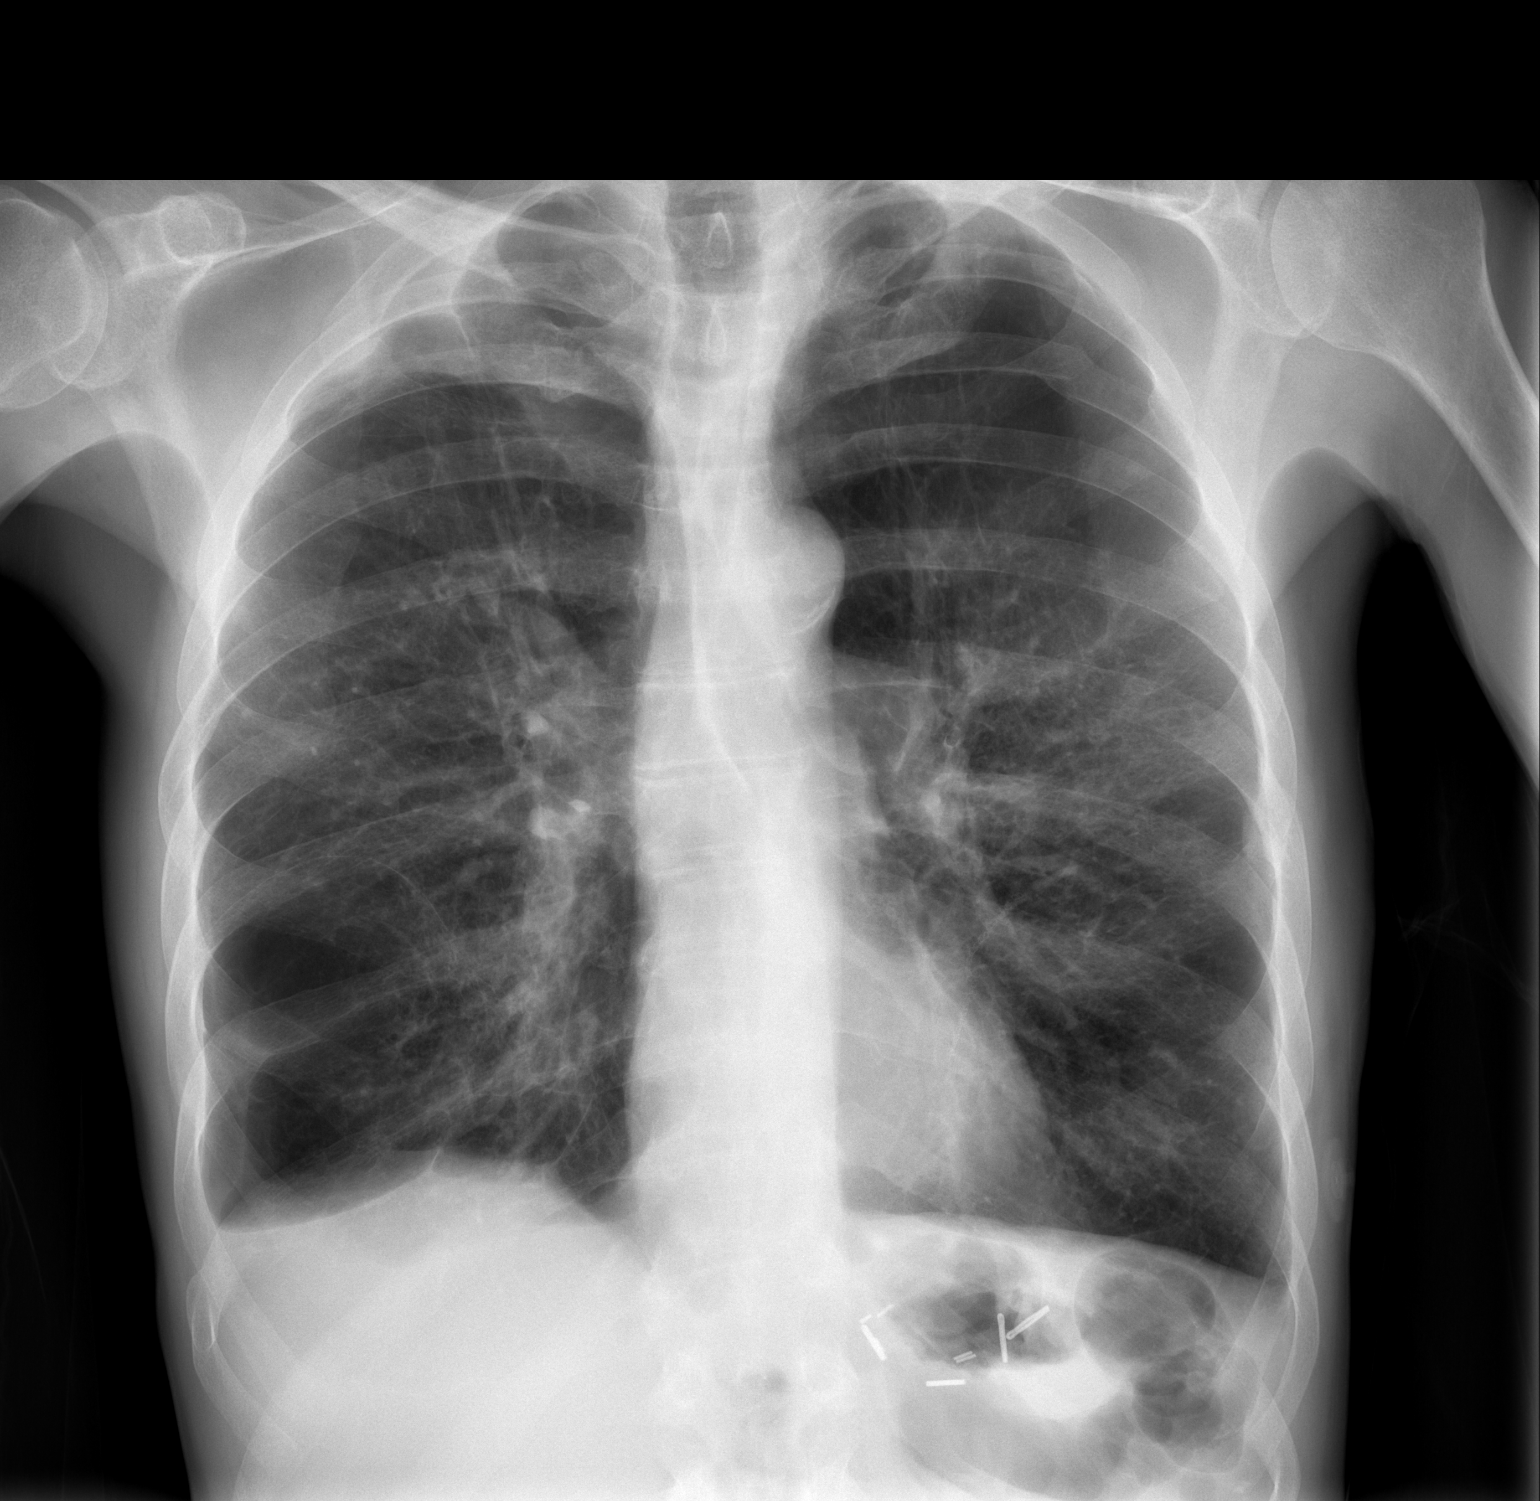

[w chest lat]
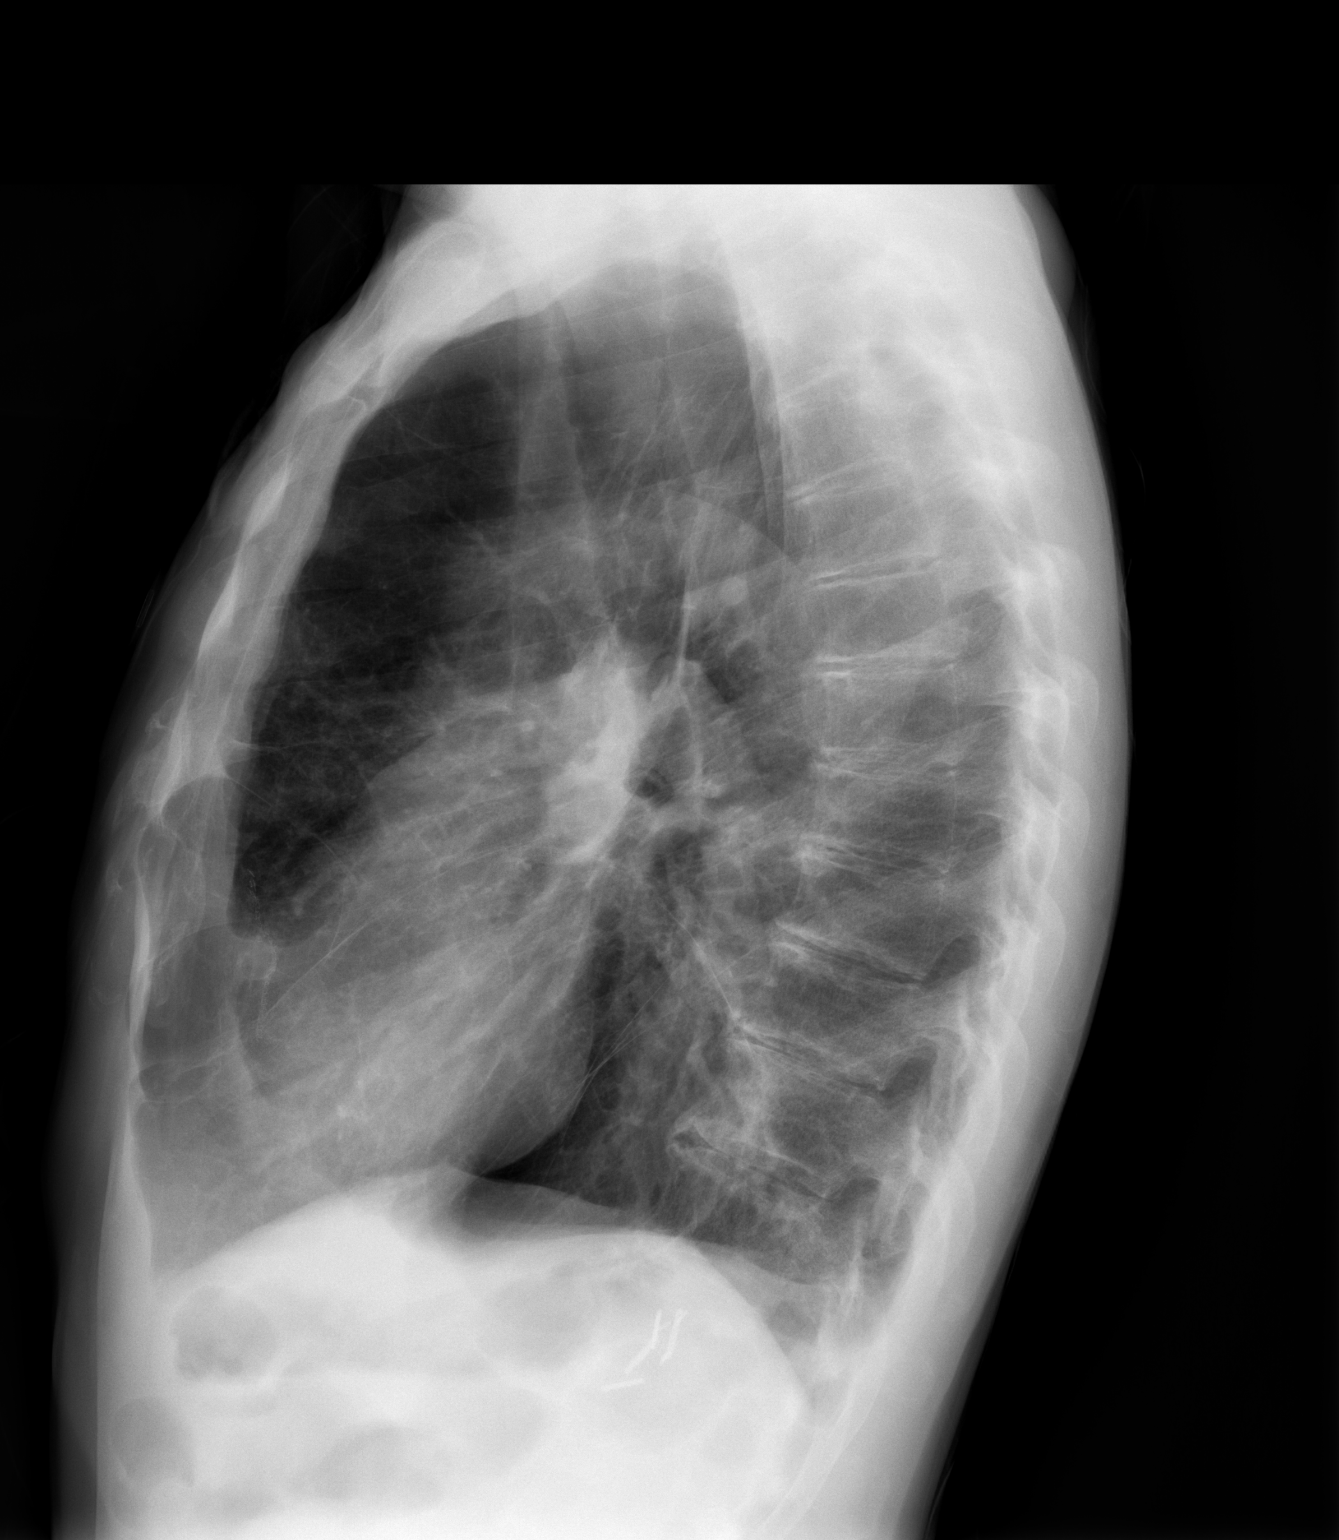

[2 of 2 positions shown; findings below may reference images not displayed]

FINDINGS: The heart size and mediastinal contours are stable.
There are stable severe emphysematous changes with asymmetric
bullous formation in the left upper lobe and in the right lower
lobe.  Biapical scarring and blunting of the right costophrenic
angle are stable.  No superimposed airspace disease, edema or
significant pleural effusion is seen.  There are old rib fractures
on the left.  Surgical clips are present within the left upper
quadrant of the abdomen.
IMPRESSION: Stable chronic lung disease with emphysema and scarring.  No acute
superimposed process identified.

## 2012-11-28 ENCOUNTER — Other Ambulatory Visit: Payer: Self-pay

## 2012-12-07 ENCOUNTER — Other Ambulatory Visit: Payer: Self-pay | Admitting: Nurse Practitioner

## 2013-01-16 ENCOUNTER — Ambulatory Visit: Payer: Medicare Other | Admitting: Internal Medicine

## 2013-02-01 ENCOUNTER — Other Ambulatory Visit: Payer: Self-pay | Admitting: Nurse Practitioner

## 2013-02-13 ENCOUNTER — Ambulatory Visit (INDEPENDENT_AMBULATORY_CARE_PROVIDER_SITE_OTHER): Payer: Medicare Other | Admitting: Nurse Practitioner

## 2013-02-13 ENCOUNTER — Encounter: Payer: Self-pay | Admitting: Nurse Practitioner

## 2013-02-13 VITALS — BP 134/78 | HR 90 | Temp 98.4°F | Wt 110.4 lb

## 2013-02-13 DIAGNOSIS — M25512 Pain in left shoulder: Secondary | ICD-10-CM

## 2013-02-13 DIAGNOSIS — M25519 Pain in unspecified shoulder: Secondary | ICD-10-CM

## 2013-02-13 DIAGNOSIS — E43 Unspecified severe protein-calorie malnutrition: Secondary | ICD-10-CM

## 2013-02-13 DIAGNOSIS — J438 Other emphysema: Secondary | ICD-10-CM

## 2013-02-13 DIAGNOSIS — J439 Emphysema, unspecified: Secondary | ICD-10-CM

## 2013-02-13 MED ORDER — PREDNISONE 10 MG PO TABS: 10.0000 mg | ORAL_TABLET | Freq: Every day | ORAL | Status: DC

## 2013-02-13 NOTE — Progress Notes (Signed)
Patient ID: Philip Coleman, male   DOB: 07/28/1955, 57 y.o.   MRN: [id number]   Allergies  Allergen Reactions  . Codeine Hives and Nausea And Vomiting    Chief Complaint  Patient presents with  . Medical Managment of Chronic Issues    3 month f/u     HPI: Patient is a 57 y.o.  male seen in the office today for routine follow up Went to orthopedic but did not take medicaid card so had to reschedule and is going back in 2 weeks due to worsening left shoulder pain; taking tramadol 50 mg every 2 hours  (taking up to 6 a day)  Next week he is going to the dermatologist to have a lesion removed from his forehead. Taking remeron 15 mg every night helps him sleep; questions if his appetite is any better.  Seeing pulmonary who referred him to cardiology.  Pt reports worsening shortness of breath in the last few days due to weather changing; pollen; Has used albuterol every 2-3 hours  Review of Systems:  Review of Systems  Constitutional: Negative for fever, chills and malaise/fatigue. Weight loss: weight has maintained.  Respiratory: Positive for shortness of breath and wheezing. Negative for cough.   Cardiovascular: Negative for chest pain and palpitations.  Gastrointestinal: Negative for heartburn, abdominal pain, diarrhea and constipation.  Genitourinary: Negative for dysuria, urgency and frequency.  Musculoskeletal: Positive for joint pain (shoulder). Negative for myalgias.  Skin: Negative.   Neurological: Negative for dizziness, weakness and headaches.  Psychiatric/Behavioral: Negative for depression. The patient does not have insomnia.      Past Medical History  Diagnosis Date  . Thyroid disease     hyper, not on meds  . COPD (chronic obstructive pulmonary disease)   . Hodgkin's disease 1993    with abdominal surgical scar -pt unable to describe   . Spontaneous pneumothorax     left  . Spontaneous pneumothorax     right  . Pneumonia   . Chronic respiratory failure   .  Anxiety state, unspecified   . Depressive disorder, not elsewhere classified   . Chronic airway obstruction, not elsewhere classified   . Unspecified constipation   . Lumbago   . Other malaise and fatigue   . Headache(784.0)   . Other dyspnea and respiratory abnormality   . Cough   . Diarrhea   . Weight loss    Past Surgical History  Procedure Laterality Date  . Splenectomy  1987  . Chest tube insertion      left  . Chest tube insertion      right  . Teeth extracted for dentures  2012   Social History:   reports that he has been smoking Cigarettes.  He has a 10.25 pack-year smoking history. He has never used smokeless tobacco. He reports that he drinks about 3.6 ounces of alcohol per week. He reports that he does not use illicit drugs.  Family History  Problem Relation Age of Onset  . Cancer Mother 26  . Pneumonia Father   . Cancer Sister   . Stroke Sister     Medications: Patient's Medications  New Prescriptions   No medications on file  Previous Medications   ALBUTEROL (PROVENTIL HFA;VENTOLIN HFA) 108 (90 BASE) MCG/ACT INHALER    Inhale 2 puffs into the lungs every 6 (six) hours as needed. wheezing   ATORVASTATIN (LIPITOR) 40 MG TABLET    TAKE 1 TABLET BY MOUTH DAILY AT 6 PM   IBUPROFEN (ADVIL,MOTRIN)  600 MG TABLET    TAKE 1 TABLET BY MOUTH EVERY 6 HOURS AS NEEDED FOR FOR PAIN   MIRTAZAPINE (REMERON) 15 MG TABLET    TAKE 1 TABLET BY MOUTH EVERY NIGHT AT BEDTIME FOR APPETITE   SPIRIVA HANDIHALER 18 MCG INHALATION CAPSULE    INHALE THE CONTENTS OF 1 CAPSULE VIA HANDIHALER BY MOUTH EVERY DAY   SYMBICORT 160-4.5 MCG/ACT INHALER    INHALE 2 PUFFS INTO THE LUNGS TWICE DAILY   TRAMADOL (ULTRAM) 50 MG TABLET    Take 2 tablets (100 mg total) by mouth every 6 (six) hours as needed for pain.  Modified Medications   No medications on file  Discontinued Medications   No medications on file     Physical Exam:  Filed Vitals:   02/13/13 1435  BP: 134/78  Pulse: 90  Temp:  98.4 F (36.9 C)  TempSrc: Oral  Weight: 110 lb 6.4 oz (50.077 kg)  SpO2: 94%    Physical Exam  Constitutional: He is oriented to person, place, and time and well-developed, well-nourished, and in no distress. No distress.  HENT:  Head: Normocephalic and atraumatic.  Nose: Nose normal.  Mouth/Throat: Oropharynx is clear and moist. No oropharyngeal exudate.  Eyes: Conjunctivae and EOM are normal. Pupils are equal, round, and reactive to light.  Neck: Normal range of motion. Neck supple. No thyromegaly present.  Cardiovascular: Normal rate, regular rhythm and normal heart sounds.   Pulmonary/Chest: No respiratory distress. He has decreased breath sounds. He has wheezes.  Some increase in RR and work of breathing from baseline  Abdominal: Soft. Bowel sounds are normal. He exhibits no distension.  Musculoskeletal: Normal range of motion. He exhibits no edema and no tenderness.  Lymphadenopathy:    He has no cervical adenopathy.  Neurological: He is alert and oriented to person, place, and time.  Skin: Skin is warm and dry. He is not diaphoretic.     Labs reviewed: Basic Metabolic Panel:  Recent Labs  40/98/11 0520 04/03/12 0502 11/15/12 1124  NA 133* 138 138  K 3.9 3.8 4.6  CL 98 100 93*  CO2 29 32 26  GLUCOSE 145* 130* 86  BUN 11 15 21   CREATININE 0.55 0.57 0.94  CALCIUM 8.9 9.0 9.8   Liver Function Tests:  Recent Labs  03/30/12 2140 04/01/12 0624 04/02/12 0520 11/15/12 1124  AST 19 32 51* 26  ALT 12 19 31 19   ALKPHOS 80 86 80 108  BILITOT 0.5 0.5 0.3 0.4  PROT 6.7 7.5 6.2 7.3  ALBUMIN 3.5 3.8 2.9*  --    No results found for this basename: LIPASE, AMYLASE,  in the last 8760 hours No results found for this basename: AMMONIA,  in the last 8760 hours CBC:  Recent Labs  04/02/12 0520 04/03/12 0502 11/15/12 1124  WBC 16.1* 15.0* 14.0*  NEUTROABS  --   --  6.3  HGB 13.2 13.2 14.1  HCT 41.0 39.7 41.5  MCV 90.3 90.4 86  PLT 315 332 337   Lipid  Panel:  Recent Labs  04/02/12 0520  CHOL 143  HDL 70  LDLCALC 62  TRIG 57  CHOLHDL 2.0     Assessment/Plan 1. COPD with emphysema -worse; will give prednisone due to worsening shortness of breath and wheezing; to keep follow up with [facility] - predniSONE (DELTASONE) 10 MG tablet; Take 1 tablet (10 mg total) by mouth daily. Take 6 tablets for 3 days then decrease by 1 tablet until completed.  Dispense: 60 tablet;  Refill: 0 -understands when to seek emergency treatment  2. Protein-calorie malnutrition, severe -Encouraged weight gain -cont Remeron; may take 1/2 tablet at night  3. Pain in joint, shoulder region, left -cont tramadol as needed -keep ortho follow up  4. Hyperlipidemia Reports cholesterol is being checked at cardiologist will notify us of who he sees and for them to send [place] lab work  Pt to follow up in 3 months; to see Dr Glade Lloyd for routine visit

## 2013-02-13 NOTE — Patient Instructions (Signed)
Please follow up in 3 months with Dr Glade Lloyd (30 min appt)   Prednisone sent to pharmacy.

## 2013-02-20 ENCOUNTER — Ambulatory Visit: Payer: Medicare Other | Admitting: Pulmonary Disease

## 2013-02-25 ENCOUNTER — Other Ambulatory Visit: Payer: Self-pay | Admitting: Nurse Practitioner

## 2013-03-01 ENCOUNTER — Other Ambulatory Visit: Payer: Self-pay | Admitting: Adult Health

## 2013-03-01 ENCOUNTER — Other Ambulatory Visit: Payer: Self-pay | Admitting: Pulmonary Disease

## 2013-03-01 ENCOUNTER — Other Ambulatory Visit: Payer: Self-pay | Admitting: Internal Medicine

## 2013-03-01 NOTE — Telephone Encounter (Signed)
Refill needs to go through his PCP.

## 2013-03-01 NOTE — Telephone Encounter (Signed)
Please advise on refill. Thanks. 

## 2013-03-07 NOTE — Telephone Encounter (Signed)
Per last ov w/ VS: Atypical chest pain - [name], MD at 09/24/2012 3:20 PM   Status: Edited Related Problem: Atypical chest pain        Repeat chest xray today. Explained that if this is stable, then he may need further assessment with PCP for cardiac or GI cause of his chest pain.  Will also increase his tramadol to 100 mg q6h prn.   Pt overdue for appt, cancelled 01/2013 appt with VS.  Verbal authorization for the tramadol given to pharmacist Noreene Larsson at [facility] with note that pt is overdue for appt.

## 2013-03-21 ENCOUNTER — Telehealth: Payer: Self-pay | Admitting: Pulmonary Disease

## 2013-03-21 MED ORDER — ALBUTEROL SULFATE HFA 108 (90 BASE) MCG/ACT IN AERS: 2.0000 | INHALATION_SPRAY | Freq: Four times a day (QID) | RESPIRATORY_TRACT | Status: DC | PRN

## 2013-03-21 NOTE — Telephone Encounter (Signed)
Rx has been sent to [facility].

## 2013-03-28 ENCOUNTER — Other Ambulatory Visit: Payer: Self-pay | Admitting: Nurse Practitioner

## 2013-04-02 ENCOUNTER — Ambulatory Visit: Payer: Medicare Other | Admitting: Pulmonary Disease

## 2013-04-16 ENCOUNTER — Telehealth: Payer: Self-pay | Admitting: Pulmonary Disease

## 2013-04-16 MED ORDER — TRAMADOL HCL 50 MG PO TABS: 50.0000 mg | ORAL_TABLET | Freq: Four times a day (QID) | ORAL | Status: DC | PRN

## 2013-04-16 NOTE — Telephone Encounter (Signed)
I called spoke with pt. He is requesting a refill on his tramadol. Last refilled 03/01/13 #45 x 0 refills Last OV 09/24/12 Pending 05/23/13 Please advise Dr. Craige Cotta thanks

## 2013-04-16 NOTE — Telephone Encounter (Signed)
I [facility]. Pt is aware and nothing further needed

## 2013-04-16 NOTE — Telephone Encounter (Signed)
Okay to send order for tramadol 50 mg, 1 pill q6h prn, dispense 60 with no refills.

## 2013-04-26 ENCOUNTER — Other Ambulatory Visit: Payer: Self-pay | Admitting: Nurse Practitioner

## 2013-04-26 ENCOUNTER — Other Ambulatory Visit: Payer: Self-pay | Admitting: Internal Medicine

## 2013-04-26 ENCOUNTER — Other Ambulatory Visit: Payer: Self-pay | Admitting: Pulmonary Disease

## 2013-04-26 NOTE — Telephone Encounter (Signed)
Please advise on refill. Thanks. 

## 2013-05-10 ENCOUNTER — Telehealth: Payer: Self-pay | Admitting: Pulmonary Disease

## 2013-05-10 NOTE — Telephone Encounter (Signed)
Called and spoke with pt and he stated that he is needing a refill of the tramadol.   i explained to the pt that VS sent this to the pharmacy on 12/18 for #45 with 2 refills.  He stated that he called the pharmacy and they told him that he would need the doctor to call for any further refills.  i called the pharmacy and they stated that this medication was filled on 12/16 for the pt for a 15 day supply----this has been called to the pharmacy with no refills given.  Pt is aware.

## 2013-05-13 ENCOUNTER — Telehealth: Payer: Self-pay | Admitting: Pulmonary Disease

## 2013-05-13 DIAGNOSIS — J439 Emphysema, unspecified: Secondary | ICD-10-CM

## 2013-05-13 MED ORDER — TRAMADOL HCL 50 MG PO TABS: ORAL_TABLET | ORAL | Status: DC

## 2013-05-13 NOTE — Telephone Encounter (Signed)
Please advise if okay to send order to evaluate patient for portable O2 [name]? thanks

## 2013-05-13 NOTE — Telephone Encounter (Signed)
Called and spoke with pt and he stated that he called Friday for a refill of the tramadol. Last rx for the [facility] for #45.   This refill was [facility] to the [facility] for #45 and this rx was never sent our or picked up and this refill was cancelled today.   Pt called today requesting a refill of the tramadol to be sent into [facility].  He stated that he called all weekend and they did not have his refill.  i called this pharmacy and they stated that the pt picked up the last rx on 12/9 for #60 but did not have any refills. i called the other pharmacy the pts has on file an this was [facility] and the pharmacy stated that the pt last filled tramadol on 08/2012 for #30 by VS but the pt has requested a refill at their store as well.   VS please advise on a refill of this medication for this pt.  Thanks  Allergies  Allergen Reactions  . Codeine Hives and Nausea And Vomiting     Current Outpatient Prescriptions on File Prior to Visit  Medication Sig Dispense Refill  . albuterol (PROAIR HFA) 108 (90 BASE) MCG/ACT inhaler Inhale 2 puffs into the lungs every 6 (six) hours as needed for wheezing or shortness of breath.  1 Inhaler  5  . atorvastatin (LIPITOR) 40 MG tablet TAKE 1 TABLET BY MOUTH DAILY AT 6 PM  30 tablet  5  . ibuprofen (ADVIL,MOTRIN) 600 MG tablet TAKE 1 TABLET BY MOUTH EVERY 6 HOURS AS NEEDED FOR FOR PAIN  120 tablet  0  . mirtazapine (REMERON) 15 MG tablet TAKE 1 TABLET BY MOUTH EVERY NIGHT AT BEDTIME FOR APPETITE  30 tablet  0  . predniSONE (DELTASONE) 10 MG tablet Take 1 tablet (10 mg total) by mouth daily. Take 6 tablets for 3 days then decrease by 1 tablet until completed.  60 tablet  0  . SPIRIVA HANDIHALER 18 MCG inhalation capsule INHALE THE CONTENTS OF 1 CAPSULE VIA HANDIHALER BY MOUTH EVERY DAY  30 capsule  5  . SYMBICORT 160-4.5 MCG/ACT inhaler INHALE 2 PUFFS INTO THE LUNGS TWICE DAILY  10.2 g  5  . traMADol  (ULTRAM) 50 MG tablet TAKE 2 TABLETS BY MOUTH EVERY 6 HOURS AS NEEDED FOR PAIN  45 tablet  2   No current facility-administered medications on file prior to visit.

## 2013-05-13 NOTE — Telephone Encounter (Signed)
Okay to send refill for tramadol.

## 2013-05-13 NOTE — Telephone Encounter (Signed)
Okay to send order for ambulatory oximetry on room air to assess for portable oxygen set up.

## 2013-05-13 NOTE — Telephone Encounter (Signed)
Refill has been called to the pharmacy  For a refill.  i called and spoke with pt  And he is aware.

## 2013-05-13 NOTE — Telephone Encounter (Signed)
Order placed - [email or web address] for Philip Coleman to inform her.

## 2013-05-14 NOTE — Telephone Encounter (Signed)
Okay.  Wasn't sure pt was still using his home oxygen.  Send order for portable oxygen evaluation with conserving device.

## 2013-05-14 NOTE — Telephone Encounter (Signed)
Spoke with [name]. She reports she will check with her resp team to see what patient already has bc she believes he is already on O2. Will await call back

## 2013-05-14 NOTE — Telephone Encounter (Signed)
[name] from [facility] called back & can be reached at [phone].  Philip Coleman

## 2013-05-14 NOTE — Telephone Encounter (Signed)
Spoke with Lenna Sciara from [facility]---she wanted to see if  VS wanted  Sat testing and [facility] to report back before they set the pt up for the portable oxygen  Or can they have an order for portable oxygen eval.  VS please advise. Thanks

## 2013-05-14 NOTE — Telephone Encounter (Signed)
Spoke with [facility] from [facility] and she stated that the pt is already on 1 liter of oxygen  Cont.   Pt only wants a conserving device so he can go out and be out longer with this device.    [facility] is requesting an order for:  Oxygen portable eval with conserving device.    VS please advise. thanks

## 2013-05-14 NOTE — Telephone Encounter (Signed)
Have them do resting and ambulatory oximetry on room air to assess need for daytime supplemental oxygen.  If patient qualifies for home oxygen will then send order to arrange for home oxygen.

## 2013-05-14 NOTE — Telephone Encounter (Signed)
I have placed order in EPIC. Message sent to [facility] as well.

## 2013-05-17 ENCOUNTER — Other Ambulatory Visit: Payer: Self-pay | Admitting: Pulmonary Disease

## 2013-05-17 NOTE — Telephone Encounter (Signed)
Please advise on refill. Thanks. 

## 2013-05-22 ENCOUNTER — Ambulatory Visit: Payer: Medicare Other | Admitting: Internal Medicine

## 2013-05-23 ENCOUNTER — Encounter: Payer: Self-pay | Admitting: Pulmonary Disease

## 2013-05-23 ENCOUNTER — Ambulatory Visit (INDEPENDENT_AMBULATORY_CARE_PROVIDER_SITE_OTHER): Payer: Medicare Other | Admitting: Pulmonary Disease

## 2013-05-23 ENCOUNTER — Other Ambulatory Visit: Payer: Self-pay | Admitting: Pulmonary Disease

## 2013-05-23 ENCOUNTER — Other Ambulatory Visit: Payer: Self-pay | Admitting: Internal Medicine

## 2013-05-23 VITALS — BP 98/64 | HR 100 | Ht 67.0 in | Wt 117.0 lb

## 2013-05-23 DIAGNOSIS — J9610 Chronic respiratory failure, unspecified whether with hypoxia or hypercapnia: Secondary | ICD-10-CM

## 2013-05-23 DIAGNOSIS — J438 Other emphysema: Secondary | ICD-10-CM

## 2013-05-23 DIAGNOSIS — F17200 Nicotine dependence, unspecified, uncomplicated: Secondary | ICD-10-CM

## 2013-05-23 DIAGNOSIS — Z720 Tobacco use: Secondary | ICD-10-CM

## 2013-05-23 DIAGNOSIS — R0789 Other chest pain: Secondary | ICD-10-CM

## 2013-05-23 DIAGNOSIS — J439 Emphysema, unspecified: Secondary | ICD-10-CM

## 2013-05-23 DIAGNOSIS — R12 Heartburn: Secondary | ICD-10-CM

## 2013-05-23 MED ORDER — TRAMADOL HCL 50 MG PO TABS: ORAL_TABLET | ORAL | Status: DC

## 2013-05-23 MED ORDER — ALBUTEROL SULFATE (2.5 MG/3ML) 0.083% IN NEBU: 2.5000 mg | INHALATION_SOLUTION | Freq: Four times a day (QID) | RESPIRATORY_TRACT | Status: DC | PRN

## 2013-05-23 NOTE — Assessment & Plan Note (Signed)
Advised him to d/w his primary care provider.

## 2013-05-23 NOTE — Progress Notes (Signed)
Chief Complaint  Patient presents with  . COPD    Breathing is worse since last OV. Reports SOB, chest pain, coughing and possible acid reflux. Denies wheezing.    History of Present Illness: Philip Coleman is a 58 y.o. male smoker with severe COPD and chronic hypoxic respiratory failure.  His breathing tends to be worse in winter months.  He still smokes 3 cigarettes per day.  He has pain along his left chest from where he had chest surgery.  He has noticed more trouble with heartburn.  He has occasional cough with clear sputum.  He denies fever, sweats, wheeze, or hemoptysis.  He tries to keep up with his nutrition, but has trouble keeping weight on.  He uses his oxygen for most of the day and at night.  TESTS: CT chest 04/01/12>>advanced emphysema, rt apical scarring Echo 04/02/12>>EF 55% Alpha 1 AT 09/24/12 >> 176, MM phenotype PFT 10/18/12 >> FEV1 0.62 (17%), FEV1% 34, TLC 6.46 (98%), RV 2.56 (256%), DLCO 27%, no BD response.  He  has a past medical history of Thyroid disease; COPD (chronic obstructive pulmonary disease); Hodgkin's disease (1993); Spontaneous pneumothorax; Spontaneous pneumothorax; Pneumonia; Chronic respiratory failure; Anxiety state, unspecified; Depressive disorder, not elsewhere classified; Chronic airway obstruction, not elsewhere classified; Unspecified constipation; Lumbago; Other malaise and fatigue; Headache(784.0); Other dyspnea and respiratory abnormality; Cough; Diarrhea; and Weight loss.  He  has past surgical history that includes Splenectomy (1987); Chest tube insertion; Chest tube insertion; and teeth extracted for dentures (2012).  Outpatient Encounter Prescriptions as of 05/23/2013  Medication Sig  . albuterol (PROAIR HFA) 108 (90 BASE) MCG/ACT inhaler Inhale 2 puffs into the lungs every 6 (six) hours as needed for wheezing or shortness of breath.  [name] atorvastatin (LIPITOR) 40 MG tablet TAKE 1 TABLET BY MOUTH DAILY AT 6 PM  . ibuprofen (ADVIL,MOTRIN) 600  MG tablet Take 1 tablet by mouth every 6 hours as needed for pain.  Need follow up appointment before next refill.  . mirtazapine (REMERON) 15 MG tablet Take 1 tablet by mouth every night at bedtime for appetite.  Need follow up appointment before next refill.  [name] SPIRIVA HANDIHALER 18 MCG inhalation capsule INHALE THE CONTENTS OF 1 CAPSULE VIA HANDIHALER BY MOUTH EVERY DAY  . SYMBICORT 160-4.5 MCG/ACT inhaler INHALE 2 PUFFS INTO THE LUNGS TWICE DAILY  . traMADol (ULTRAM) 50 MG tablet TAKE 2 TABLETS BY MOUTH EVERY 6 HOURS AS NEEDED FOR PAIN  . [DISCONTINUED] ibuprofen (ADVIL,MOTRIN) 600 MG tablet TAKE 1 TABLET BY MOUTH EVERY 6 HOURS AS NEEDED FOR FOR PAIN  . [DISCONTINUED] mirtazapine (REMERON) 15 MG tablet TAKE 1 TABLET BY MOUTH EVERY NIGHT AT BEDTIME FOR APPETITE  . [DISCONTINUED] predniSONE (DELTASONE) 10 MG tablet Take 1 tablet (10 mg total) by mouth daily. Take 6 tablets for 3 days then decrease by 1 tablet until completed.    Allergies  Allergen Reactions  . Codeine Hives and Nausea And Vomiting    Physical Exam:  General - Thin, wearing oxygen ENT - No sinus tenderness, no oral exudate, no LAN Cardiac - s1s2 regular, no murmur Chest - Decreased breath sounds, no wheeze Back - No focal tenderness Abd - Soft, non-tender Ext - No edema Neuro - Normal strength Skin - No rashes Psych - calm, pleasant   Assessment/Plan:  Chesley Mires, MD [facility] Pager:  [phone] 05/23/2013, 10:17 AM

## 2013-05-23 NOTE — Assessment & Plan Note (Signed)
Likely related to previous chest surgical intervention after pneumothorax on Lt.  Continue prn tramadol.

## 2013-05-23 NOTE — Assessment & Plan Note (Signed)
Continue spiriva, symbicort, and prn albuterol.

## 2013-05-23 NOTE — Assessment & Plan Note (Signed)
Encouraged him to continue with his smoking cessation efforts.

## 2013-05-23 NOTE — Assessment & Plan Note (Signed)
He is to continue with 2 liters oxygen 24/7. 

## 2013-05-23 NOTE — Patient Instructions (Signed)
Follow up in 6 months 

## 2013-06-05 ENCOUNTER — Ambulatory Visit: Payer: Self-pay | Admitting: Nurse Practitioner

## 2013-06-12 ENCOUNTER — Ambulatory Visit (INDEPENDENT_AMBULATORY_CARE_PROVIDER_SITE_OTHER): Payer: Medicare Other | Admitting: Nurse Practitioner

## 2013-06-12 ENCOUNTER — Encounter: Payer: Self-pay | Admitting: Nurse Practitioner

## 2013-06-12 VITALS — BP 100/70 | HR 108 | Temp 98.5°F | Resp 18 | Wt 115.0 lb

## 2013-06-12 DIAGNOSIS — E785 Hyperlipidemia, unspecified: Secondary | ICD-10-CM

## 2013-06-12 DIAGNOSIS — F17200 Nicotine dependence, unspecified, uncomplicated: Secondary | ICD-10-CM

## 2013-06-12 DIAGNOSIS — Z720 Tobacco use: Secondary | ICD-10-CM

## 2013-06-12 DIAGNOSIS — E43 Unspecified severe protein-calorie malnutrition: Secondary | ICD-10-CM

## 2013-06-12 DIAGNOSIS — J438 Other emphysema: Secondary | ICD-10-CM

## 2013-06-12 DIAGNOSIS — J439 Emphysema, unspecified: Secondary | ICD-10-CM

## 2013-06-12 DIAGNOSIS — R12 Heartburn: Secondary | ICD-10-CM

## 2013-06-12 MED ORDER — OMEPRAZOLE 20 MG PO CPDR: 20.0000 mg | DELAYED_RELEASE_CAPSULE | Freq: Every day | ORAL | Status: DC

## 2013-06-12 NOTE — Patient Instructions (Signed)
Make follow up in 3 months with DR [place] BLOOD WORK BEFORE VISIT

## 2013-06-12 NOTE — Progress Notes (Signed)
Patient ID: Philip Coleman, male   DOB: 08/19/1955, 58 y.o.   MRN: [id number]    Allergies  Allergen Reactions  . Codeine Hives and Nausea And Vomiting    Chief Complaint  Patient presents with  . Follow-up    3 month f/u   . other    questions regarding Lipitor causing Diabetes and form for personal help around home filled out    HPI: Patient is a 58 y.o. male seen in the office today for follow up; needs form filled out for help around the house -appetite comes and go; eats small meals a day; only has a microwave so eating good is hard, hopes to get into a apartment  -went to dermatology; had lesion removed follows up in 6 month -orthopedic doctor gave him a shot in his shoulder which helped some, taking tramadol as well  -now on oxygen more frequently when he goes out; has had it for over 2 years   Review of Systems:  Review of Systems  Constitutional: Negative for fever, chills and malaise/fatigue. Weight loss: weight has maintained and had some weight gain.  Respiratory: Positive for cough, shortness of breath and wheezing.        Chronic; no worsening of symptoms   Cardiovascular: Negative for chest pain and palpitations.  Gastrointestinal: Negative for heartburn, nausea, vomiting, abdominal pain, diarrhea and constipation.  Genitourinary: Negative for dysuria, urgency and frequency.  Musculoskeletal: Positive for joint pain (shoulder). Negative for myalgias.  Skin: Negative.   Neurological: Negative for dizziness, weakness and headaches.  Psychiatric/Behavioral: Negative for depression. The patient does not have insomnia.      Past Medical History  Diagnosis Date  . Thyroid disease     hyper, not on meds  . COPD (chronic obstructive pulmonary disease)   . Hodgkin's disease 1993    with abdominal surgical scar -pt unable to describe   . Spontaneous pneumothorax     left  . Spontaneous pneumothorax     right  . Pneumonia   . Chronic respiratory failure   . Anxiety  state, unspecified   . Depressive disorder, not elsewhere classified   . Chronic airway obstruction, not elsewhere classified   . Unspecified constipation   . Lumbago   . Other malaise and fatigue   . Headache(784.0)   . Other dyspnea and respiratory abnormality   . Cough   . Diarrhea   . Weight loss    Past Surgical History  Procedure Laterality Date  . Splenectomy  1987  . Chest tube insertion      left  . Chest tube insertion      right  . Teeth extracted for dentures  2012   Social History:   reports that he has been smoking Cigarettes.  He has a 10.25 pack-year smoking history. He has never used smokeless tobacco. He reports that he drinks about 3.6 ounces of alcohol per week. He reports that he does not use illicit drugs.  Family History  Problem Relation Age of Onset  . Cancer Mother 70  . Pneumonia Father   . Cancer Sister   . Stroke Sister     Medications: Patient's Medications  New Prescriptions   No medications on file  Previous Medications   ALBUTEROL (PROAIR HFA) 108 (90 BASE) MCG/ACT INHALER    Inhale 2 puffs into the lungs every 6 (six) hours as needed for wheezing or shortness of breath.   ALBUTEROL (PROVENTIL) (2.5 MG/3ML) 0.083% NEBULIZER SOLUTION  Take 3 mLs (2.5 mg total) by nebulization every 6 (six) hours as needed for wheezing or shortness of breath.   ATORVASTATIN (LIPITOR) 40 MG TABLET    TAKE 1 TABLET BY MOUTH DAILY AT 6 PM   IBUPROFEN (ADVIL,MOTRIN) 600 MG TABLET    Take 1 tablet by mouth every 6 hours as needed for pain.  Need follow up appointment before next refill.   MIRTAZAPINE (REMERON) 15 MG TABLET    Take 1 tablet by mouth every night at bedtime for appetite.  Need follow up appointment before next refill.   SPIRIVA HANDIHALER 18 MCG INHALATION CAPSULE    INHALE THE CONTENTS OF 1 CAPSULE VIA HANDIHALER BY MOUTH EVERY DAY   SYMBICORT 160-4.5 MCG/ACT INHALER    INHALE 2 PUFFS INTO THE LUNGS TWICE DAILY   TRAMADOL (ULTRAM) 50 MG TABLET     TAKE 2 TABLETS BY MOUTH EVERY 6 HOURS AS NEEDED FOR PAIN  Modified Medications   No medications on file  Discontinued Medications   No medications on file     Physical Exam:  Filed Vitals:   06/12/13 1453  BP: 100/70  Pulse: 108  Temp: 98.5 F (36.9 C)  TempSrc: Oral  Resp: 18  Weight: 115 lb (52.164 kg)  SpO2: 94%   Physical Exam  Constitutional: He is oriented to person, place, and time and well-developed, well-nourished, and in no distress. No distress.  HENT:  Head: Normocephalic and atraumatic.  Right Ear: External ear normal.  Left Ear: External ear normal.  Nose: Nose normal.  Mouth/Throat: Oropharynx is clear and moist. No oropharyngeal exudate.  Eyes: Conjunctivae and EOM are normal. Pupils are equal, round, and reactive to light.  Neck: Normal range of motion. Neck supple. No thyromegaly present.  Cardiovascular: Normal rate, regular rhythm and normal heart sounds.   Pulmonary/Chest: Effort normal and breath sounds normal. No respiratory distress. He has no wheezes.  Abdominal: Soft. Bowel sounds are normal. He exhibits no distension.  Musculoskeletal: Normal range of motion. He exhibits no edema and no tenderness.  Lymphadenopathy:    He has no cervical adenopathy.  Neurological: He is alert and oriented to person, place, and time.  Skin: Skin is warm and dry. He is not diaphoretic.  Psychiatric: Affect normal.    Labs reviewed: Basic Metabolic Panel:  Recent Labs  11/15/12 1124  NA 138  K 4.6  CL 93*  CO2 26  GLUCOSE 86  BUN 21  CREATININE 0.94  CALCIUM 9.8   Liver Function Tests:  Recent Labs  11/15/12 1124  AST 26  ALT 19  ALKPHOS 108  BILITOT 0.4  PROT 7.3   No results found for this basename: LIPASE, AMYLASE,  in the last 8760 hours No results found for this basename: AMMONIA,  in the last 8760 hours CBC:  Recent Labs  11/15/12 1124  WBC 14.0*  NEUTROABS 6.3  HGB 14.1  HCT 41.5  MCV 86  PLT 337   Assessment/Plan  1.  COPD with emphysema -encouraged to cut back cigarettes to max 3 cigarettes -conts with PRN albuterol, symbicort - CBC With differential/Platelet  2. Protein-calorie malnutrition, severe -with weight gain, encouraged proper nutrition  -will cont on remeron - Comprehensive metabolic panel - CBC With differential/Platelet  3. Heartburn -on and off will give omeprazole (PRILOSEC) 20 MG capsule; Take 1 capsule (20 mg total) by mouth daily.  Dispense: 30 capsule; Refill: 3  4. Other and unspecified hyperlipidemia -currently on Lipitor; last lipids in 2013 Will follow  up lipids before next visit - Comprehensive metabolic panel - Lipid panel; Future  5. Tobacco abuse -encouraged to quit smoking; he reports he has cut back from 3 packs to 3-5 a day -encouraged to cut back to 3 cigarettes max a day    TO [place] in 3 months with blood work before visit

## 2013-06-13 ENCOUNTER — Telehealth: Payer: Self-pay | Admitting: Pulmonary Disease

## 2013-06-13 MED ORDER — TRAMADOL HCL 50 MG PO TABS: ORAL_TABLET | ORAL | Status: DC

## 2013-06-13 NOTE — Telephone Encounter (Signed)
Refill has been called to the pharmacy.  Pt is aware.

## 2013-06-17 ENCOUNTER — Other Ambulatory Visit: Payer: Self-pay | Admitting: Pulmonary Disease

## 2013-06-17 ENCOUNTER — Other Ambulatory Visit: Payer: Self-pay | Admitting: *Deleted

## 2013-06-17 NOTE — Telephone Encounter (Signed)
Please advise on refill. Thanks. 

## 2013-06-19 ENCOUNTER — Other Ambulatory Visit: Payer: Self-pay | Admitting: Nurse Practitioner

## 2013-06-19 DIAGNOSIS — E43 Unspecified severe protein-calorie malnutrition: Secondary | ICD-10-CM

## 2013-06-20 ENCOUNTER — Other Ambulatory Visit: Payer: Self-pay | Admitting: Nurse Practitioner

## 2013-06-20 ENCOUNTER — Other Ambulatory Visit: Payer: Self-pay | Admitting: Internal Medicine

## 2013-06-21 ENCOUNTER — Telehealth: Payer: Self-pay | Admitting: Pulmonary Disease

## 2013-06-21 NOTE — Telephone Encounter (Signed)
Called and spoke with [facility]. Was advised pt picked up tramadol on the following dates for #45 tabs: 06/20/13 06/13/13 06/03/13 05/21/13 05/13/13 Called [facility]. Was advised he has not had tramadol refilled through them since December. Please advise [name] regarding tramadol refill on pt? thanks

## 2013-06-21 NOTE — Telephone Encounter (Signed)
Noted.  Will route to his PCP also to be aware of this issue.  He may need additional assessment about his pain medication needs >> would defer this to his PCP.

## 2013-06-24 NOTE — Telephone Encounter (Signed)
I have not seen this patient before. Janett Billow, could you address this. thanks

## 2013-06-24 NOTE — Telephone Encounter (Signed)
I spoke with patient and informed him that he needs an appointment to see Dr.Pandey for medication management, patient would like an appointment next week if possible. Dr.Pandey's schedule is completely booked, I will see if we are able to work patient in next Tuesday or Wednesday and contact patient back.

## 2013-06-24 NOTE — Telephone Encounter (Signed)
Please make appt regarding this (pt needs to see Dr Guinevere Ferrari since he has not been seen by MD in the practice) since he est care; thanks

## 2013-06-26 NOTE — Telephone Encounter (Signed)
Patient called back, patient is unable to come in today due to transportation (patient with assisted transportation and they need a 48-72 hour notice). Patient scheduled to see Dr.Pandey next Wednesday 07/03/2013 at 1:00 pm

## 2013-06-26 NOTE — Telephone Encounter (Signed)
Left message on voicemail for patient to return call when available, reason for call- offer patient appointment to be seen today at 12:30 pm or 2:30 pm. Patient needs to call to confirm either appointment

## 2013-07-03 ENCOUNTER — Ambulatory Visit (INDEPENDENT_AMBULATORY_CARE_PROVIDER_SITE_OTHER): Payer: Medicare Other | Admitting: Internal Medicine

## 2013-07-03 ENCOUNTER — Encounter: Payer: Self-pay | Admitting: Internal Medicine

## 2013-07-03 VITALS — BP 110/68 | HR 51 | Temp 98.8°F | Resp 12 | Wt 115.0 lb

## 2013-07-03 DIAGNOSIS — E43 Unspecified severe protein-calorie malnutrition: Secondary | ICD-10-CM

## 2013-07-03 DIAGNOSIS — J438 Other emphysema: Secondary | ICD-10-CM

## 2013-07-03 DIAGNOSIS — K219 Gastro-esophageal reflux disease without esophagitis: Secondary | ICD-10-CM

## 2013-07-03 DIAGNOSIS — R0789 Other chest pain: Secondary | ICD-10-CM

## 2013-07-03 DIAGNOSIS — J439 Emphysema, unspecified: Secondary | ICD-10-CM

## 2013-07-03 MED ORDER — TRAMADOL HCL 50 MG PO TABS: 100.0000 mg | ORAL_TABLET | Freq: Two times a day (BID) | ORAL | Status: DC | PRN

## 2013-07-03 NOTE — Progress Notes (Signed)
Patient ID: Philip Coleman, male   DOB: 03/06/1956, 58 y.o.   MRN: [id number]     Chief Complaint  Patient presents with  . Medication Management    Follow-up on medications    Allergies  Allergen Reactions  . Codeine Hives and Nausea And Vomiting   HPI 58 y.o. male seen in the office today for follow up. There appears to be some confusion regarding his use of tramadol and concern for misuse. On review of medication, he has been taking his meds as prescribed. Denies any complaints this visit. Complaint with his medications. Continues to smoke  Review of Systems:  Constitutional: Negative for fever, chills and malaise/fatigue. Weight loss Respiratory: chronic cough, breathing comfortable at rest Cardiovascular: Negative for chest pain and palpitations.  Gastrointestinal: Negative for heartburn, nausea, vomiting, abdominal pain, diarrhea and constipation.  Genitourinary: Negative for dysuria, urgency and frequency.  Musculoskeletal: Negative for myalgias.  Skin: Negative.   Neurological: Negative for dizziness, weakness and headaches.  Psychiatric/Behavioral: Negative for depression. The patient does not have insomnia.    Past Medical History  Diagnosis Date  . Thyroid disease     hyper, not on meds  . COPD (chronic obstructive pulmonary disease)   . Hodgkin's disease 1993    with abdominal surgical scar -pt unable to describe   . Spontaneous pneumothorax     left  . Spontaneous pneumothorax     right  . Pneumonia   . Chronic respiratory failure   . Anxiety state, unspecified   . Depressive disorder, not elsewhere classified   . Chronic airway obstruction, not elsewhere classified   . Unspecified constipation   . Lumbago   . Other malaise and fatigue   . Headache(784.0)   . Other dyspnea and respiratory abnormality   . Cough   . Diarrhea   . Weight loss    Past Surgical History  Procedure Laterality Date  . Splenectomy  1987  . Chest tube insertion      left  .  Chest tube insertion      right  . Teeth extracted for dentures  2012   Current Outpatient Prescriptions on File Prior to Visit  Medication Sig Dispense Refill  . albuterol (PROAIR HFA) 108 (90 BASE) MCG/ACT inhaler Inhale 2 puffs into the lungs every 6 (six) hours as needed for wheezing or shortness of breath.  1 Inhaler  5  . albuterol (PROVENTIL) (2.5 MG/3ML) 0.083% nebulizer solution Take 3 mLs (2.5 mg total) by nebulization every 6 (six) hours as needed for wheezing or shortness of breath.  120 vial  5  . atorvastatin (LIPITOR) 40 MG tablet TAKE 1 TABLET BY MOUTH DAILY AT 6 PM  30 tablet  5  . mirtazapine (REMERON) 15 MG tablet Take 1 tablet by mouth every night at bedtime for appetite  30 tablet  1  . omeprazole (PRILOSEC) 20 MG capsule Take 1 capsule (20 mg total) by mouth daily.  30 capsule  3  . SPIRIVA HANDIHALER 18 MCG inhalation capsule INHALE THE CONTENTS OF 1 CAPSULE VIA HANDIHALER BY MOUTH EVERY DAY  30 capsule  5  . SYMBICORT 160-4.5 MCG/ACT inhaler INHALE 2 PUFFS INTO THE LUNGS TWICE DAILY  10.2 g  2   No current facility-administered medications on file prior to visit.    Physical exam BP 110/68  Pulse 51  Temp(Src) 98.8 F (37.1 C) (Oral)  Resp 12  Wt 115 lb (52.164 kg)  SpO2 98%  Constitutional: He is oriented to  person, place, and time and well-developed, well-nourished, and in no distress.  HENT:   Head: Normocephalic and atraumatic.  Right Ear: External ear normal.  Left Ear: External ear normal.   Nose: Nose normal.   Mouth/Throat: Oropharynx is clear and moist. No oropharyngeal exudate.  Eyes: Conjunctivae and EOM are normal. Pupils are equal, round, and reactive to light.  Neck: Normal range of motion. Neck supple. No thyromegaly present.  Cardiovascular: Normal rate, regular rhythm and normal heart sounds.   Pulmonary/Chest: Effort normal and breath sounds normal. No respiratory distress. He has no wheezes.  Abdominal: Soft. Bowel sounds are normal. He  exhibits no distension.  Musculoskeletal: Normal range of motion. He exhibits no edema and no tenderness.  Lymphadenopathy:    He has no cervical adenopathy.  Neurological: He is alert and oriented to person, place, and time.  Skin: Skin is warm and dry. He is not diaphoretic.  Psychiatric: Affect normal.   Assessment/plan  1. COPD with emphysema Continue symbicort, spiriva and prn albuterol. Encouraged tobacco cessation  2. Protein-calorie malnutrition, severe Stable weight. Continue remeron  3. Esophageal reflux Stable with prilosec  4. Atypical chest pain Prn tramadol has been helpful. Continue this

## 2013-07-18 ENCOUNTER — Other Ambulatory Visit: Payer: Self-pay | Admitting: Internal Medicine

## 2013-07-18 NOTE — Telephone Encounter (Signed)
Left message on voicemail for patient to return call when available, reason for call: discuss refill request, medication list states that patient is taking Tramadol vs Ibuprofen

## 2013-07-19 NOTE — Addendum Note (Signed)
Addended by: Logan Bores on: 07/19/2013 03:40 PM   Modules accepted: Orders

## 2013-07-19 NOTE — Telephone Encounter (Signed)
RX was approved in error, patient returned call and indicated he is no longer taking Ibuprofen. I called the pharmacy and rx was already sent out, the driver was in process of delivering rx. If rx was to be cancelled it would delay patient receiving all medications for 5 days.

## 2013-07-31 ENCOUNTER — Other Ambulatory Visit: Payer: Self-pay | Admitting: *Deleted

## 2013-07-31 MED ORDER — TRAMADOL HCL 50 MG PO TABS: 100.0000 mg | ORAL_TABLET | Freq: Two times a day (BID) | ORAL | Status: DC | PRN

## 2013-08-20 ENCOUNTER — Other Ambulatory Visit: Payer: Self-pay | Admitting: Internal Medicine

## 2013-09-06 ENCOUNTER — Other Ambulatory Visit: Payer: Self-pay | Admitting: *Deleted

## 2013-09-06 NOTE — Telephone Encounter (Signed)
Patient called to get refill on his tramadol medication, he states that he doesn't drive and his HHA can only take him on errands once a week. I told him that I would try to get the pharmacy to take the order over the phone.

## 2013-09-09 ENCOUNTER — Other Ambulatory Visit: Payer: Medicare Other

## 2013-09-09 DIAGNOSIS — E785 Hyperlipidemia, unspecified: Secondary | ICD-10-CM

## 2013-09-11 ENCOUNTER — Ambulatory Visit (INDEPENDENT_AMBULATORY_CARE_PROVIDER_SITE_OTHER): Payer: Medicare Other | Admitting: Internal Medicine

## 2013-09-11 ENCOUNTER — Encounter: Payer: Self-pay | Admitting: Internal Medicine

## 2013-09-11 VITALS — BP 110/68 | HR 93 | Wt 110.6 lb

## 2013-09-11 DIAGNOSIS — Z720 Tobacco use: Secondary | ICD-10-CM

## 2013-09-11 DIAGNOSIS — E785 Hyperlipidemia, unspecified: Secondary | ICD-10-CM

## 2013-09-11 DIAGNOSIS — F17200 Nicotine dependence, unspecified, uncomplicated: Secondary | ICD-10-CM

## 2013-09-11 DIAGNOSIS — J9610 Chronic respiratory failure, unspecified whether with hypoxia or hypercapnia: Secondary | ICD-10-CM

## 2013-09-11 DIAGNOSIS — N529 Male erectile dysfunction, unspecified: Secondary | ICD-10-CM

## 2013-09-11 DIAGNOSIS — K219 Gastro-esophageal reflux disease without esophagitis: Secondary | ICD-10-CM

## 2013-09-11 DIAGNOSIS — E43 Unspecified severe protein-calorie malnutrition: Secondary | ICD-10-CM

## 2013-09-11 LAB — LIPID PANEL
CHOLESTEROL TOTAL: 203 mg/dL — AB (ref 100–199)
Chol/HDL Ratio: 3.8 ratio units (ref 0.0–5.0)
HDL: 53 mg/dL (ref >39)
LDL Calculated: 120 mg/dL — ABNORMAL HIGH (ref 0–99)
Triglycerides: 152 mg/dL — ABNORMAL HIGH (ref 0–149)
VLDL Cholesterol Cal: 30 mg/dL (ref 5–40)

## 2013-09-11 MED ORDER — SILDENAFIL CITRATE 50 MG PO TABS: 50.0000 mg | ORAL_TABLET | Freq: Every day | ORAL | Status: DC | PRN

## 2013-09-11 NOTE — Progress Notes (Signed)
Patient ID: Philip Coleman, male   DOB: [birth year], 58 y.o.   MRN: [id number]  Chief Complaint  Patient presents with  . Medical Management of Chronic Issues    3 month f/u & discuss labs (printed)  . other    wants Viagra    Allergies  Allergen Reactions  . Codeine Hives and Nausea And Vomiting   HPI 58 y.o. male patient is seen in the office today for follow up. Continues to smoke. Stopped his atorvastatin and omeprazole. On o2 by nasal canula. Has been having problem with erection- he can get an erection but has problem maintaing it. No chest pain or dyspnea reported, no other complaints  Review of Systems:   Constitutional: Negative for fever, chills and malaise/fatigue. Weight loss present Respiratory: chronic cough, breathing comfortable at rest Cardiovascular: Negative for chest pain and palpitations.   Gastrointestinal: Negative for heartburn, nausea, vomiting, abdominal pain, diarrhea and constipation.   Genitourinary: Negative for dysuria, urgency and frequency.   Musculoskeletal: Negative for myalgias.   Skin: Negative.    Neurological: Negative for dizziness, weakness and headaches.   Psychiatric/Behavioral: Negative for depression. The patient does not have insomnia.      Past Medical History  Diagnosis Date  . Thyroid disease     hyper, not on meds  . COPD (chronic obstructive pulmonary disease)   . Hodgkin's disease 1993    with abdominal surgical scar -pt unable to describe   . Spontaneous pneumothorax     left  . Spontaneous pneumothorax     right  . Pneumonia   . Chronic respiratory failure   . Anxiety state, unspecified   . Depressive disorder, not elsewhere classified   . Chronic airway obstruction, not elsewhere classified   . Unspecified constipation   . Lumbago   . Other malaise and fatigue   . Headache(784.0)   . Other dyspnea and respiratory abnormality   . Cough   . Diarrhea   . Weight loss    Past Surgical History  Procedure Laterality  Date  . Splenectomy  1987  . Chest tube insertion      left  . Chest tube insertion      right  . Teeth extracted for dentures  2012   Medication reviewed. See [facility]  Physical exam BP 110/68  Pulse 93  Wt 110 lb 9.6 oz (50.168 kg)  SpO2 95%  Constitutional: He is oriented to person, place, and time and well-developed, well-nourished, and in no distress.   HENT:   Head: Normocephalic and atraumatic.  Nose: Nose normal.   Mouth/Throat: Oropharynx is clear and moist. No oropharyngeal exudate.   Eyes: Conjunctivae and EOM are normal. Pupils are equal, round, and reactive to light.   Neck: Normal range of motion. Neck supple. No thyromegaly present.   Cardiovascular: Normal rate, regular rhythm and normal heart sounds.    Pulmonary/Chest: Effort normal and breath sounds normal. No respiratory distress. He has some expiratory wheezes. On o2 by nasal canula Abdominal: Soft. Bowel sounds are normal. He exhibits no distension.  Musculoskeletal: Normal range of motion. He exhibits no edema and no tenderness.  Lymphadenopathy:    He has no cervical adenopathy.  Neurological: He is alert and oriented to person, place, and time.   Skin: Skin is warm and dry. He is not diaphoretic.  Psychiatric: Affect normal.   Labs- Lipid Panel     Component Value Date/Time   CHOL 143 04/02/2012 0520   TRIG 152* 09/09/2013 1357  HDL 53 09/09/2013 1357   HDL 70 04/02/2012 0520   CHOLHDL 3.8 09/09/2013 1357   CHOLHDL 2.0 04/02/2012 0520   VLDL 11 04/02/2012 0520   LDLCALC 120* 09/09/2013 1357   LDLCALC 62 04/02/2012 0520    Assessment/plan  1. Chronic respiratory failure Continue symbicort, spiriva and prn albuterol. Continue o2 by nasal canula. Encouraged tobacco cessation  2. Protein-calorie malnutrition, severe In setting of his progressive copd, has been losing weight. Monitor clinically. Continue remeron  3. Tobacco abuse counselled about cessation of smoking.  4. Other and unspecified  hyperlipidemia Pt to resume atorvastatin 40 mg daily, agrees to  5. Esophageal reflux Has stopped omeprazole. Monitor clinically for symptoms  6. Erectile dysfunction viagra 50 mg daily as needed prescribed

## 2013-09-12 ENCOUNTER — Other Ambulatory Visit: Payer: Self-pay | Admitting: *Deleted

## 2013-09-12 ENCOUNTER — Telehealth: Payer: Self-pay | Admitting: *Deleted

## 2013-09-12 MED ORDER — SILDENAFIL CITRATE 25 MG PO TABS: 25.0000 mg | ORAL_TABLET | Freq: Every day | ORAL | Status: DC | PRN

## 2013-09-12 NOTE — Telephone Encounter (Signed)
Changed medication in patient's chart and faxed to Pharmacy. Patient Notified.

## 2013-09-12 NOTE — Telephone Encounter (Signed)
There is no 20 mg daose. Let's have his script changed to 25 mg po once a day as needed of viagra and provide 10 pills with 1 refill

## 2013-09-12 NOTE — Telephone Encounter (Signed)
Insurance would not cover Viagra 50mg , so Dr. Bubba Camp stated to call in Viagra 25mg  instead. Faxed to Pharmacy and patient Notified.

## 2013-09-12 NOTE — Telephone Encounter (Signed)
Patient called and stated that his insurance will not cover Viagra 50mg  but will cover Viagra 20mg . Can this be changed? Please Advise.

## 2013-09-17 ENCOUNTER — Other Ambulatory Visit: Payer: Self-pay | Admitting: Internal Medicine

## 2013-10-01 ENCOUNTER — Other Ambulatory Visit: Payer: Self-pay | Admitting: *Deleted

## 2013-10-01 NOTE — Telephone Encounter (Signed)
Patient called and wanted Reflux medication called into pharmacy but no medication is listed on patient's active medication list. Informed patient that he would need an appointment for something to be prescribed.

## 2013-10-08 ENCOUNTER — Other Ambulatory Visit: Payer: Self-pay | Admitting: *Deleted

## 2013-10-08 MED ORDER — TRAMADOL HCL 50 MG PO TABS: 100.0000 mg | ORAL_TABLET | Freq: Two times a day (BID) | ORAL | Status: DC | PRN

## 2013-11-01 ENCOUNTER — Other Ambulatory Visit: Payer: Self-pay | Admitting: *Deleted

## 2013-11-01 MED ORDER — TRAMADOL HCL 50 MG PO TABS: 100.0000 mg | ORAL_TABLET | Freq: Two times a day (BID) | ORAL | Status: DC | PRN

## 2013-11-01 NOTE — Telephone Encounter (Signed)
Patient requested refill

## 2013-11-11 ENCOUNTER — Telehealth: Payer: Self-pay | Admitting: Nurse Practitioner

## 2013-11-11 NOTE — Telephone Encounter (Signed)
Received form form [employer] for: [employer]---[id number], [id number], [phone]-- Called pt to see if he requested the DME.Marland KitchenMarland Kitchen

## 2013-11-21 ENCOUNTER — Encounter (HOSPITAL_COMMUNITY)
Admission: EM | Disposition: A | Discharge status: Home / Self Care | Payer: Self-pay | Source: Home / Self Care | Attending: Internal Medicine

## 2013-11-21 ENCOUNTER — Inpatient Hospital Stay (HOSPITAL_COMMUNITY)
Admission: EM | Admit: 2013-11-21 | Discharge: 2013-11-26 | DRG: 246 | Disposition: A | Discharge status: Home / Self Care | Payer: Medicare Other | Attending: Internal Medicine | Admitting: Internal Medicine

## 2013-11-21 ENCOUNTER — Emergency Department (HOSPITAL_COMMUNITY): Payer: Medicare Other

## 2013-11-21 ENCOUNTER — Encounter (HOSPITAL_COMMUNITY): Payer: Self-pay | Admitting: Emergency Medicine

## 2013-11-21 DIAGNOSIS — E785 Hyperlipidemia, unspecified: Secondary | ICD-10-CM

## 2013-11-21 DIAGNOSIS — I2510 Atherosclerotic heart disease of native coronary artery without angina pectoris: Secondary | ICD-10-CM

## 2013-11-21 DIAGNOSIS — E46 Unspecified protein-calorie malnutrition: Secondary | ICD-10-CM | POA: Diagnosis present

## 2013-11-21 DIAGNOSIS — N529 Male erectile dysfunction, unspecified: Secondary | ICD-10-CM | POA: Diagnosis present

## 2013-11-21 DIAGNOSIS — Z681 Body mass index (BMI) 19 or less, adult: Secondary | ICD-10-CM | POA: Diagnosis not present

## 2013-11-21 DIAGNOSIS — Z8571 Personal history of Hodgkin lymphoma: Secondary | ICD-10-CM | POA: Diagnosis not present

## 2013-11-21 DIAGNOSIS — F17200 Nicotine dependence, unspecified, uncomplicated: Secondary | ICD-10-CM | POA: Diagnosis present

## 2013-11-21 DIAGNOSIS — I200 Unstable angina: Secondary | ICD-10-CM

## 2013-11-21 DIAGNOSIS — Z9981 Dependence on supplemental oxygen: Secondary | ICD-10-CM | POA: Diagnosis not present

## 2013-11-21 DIAGNOSIS — K219 Gastro-esophageal reflux disease without esophagitis: Secondary | ICD-10-CM | POA: Diagnosis present

## 2013-11-21 DIAGNOSIS — J441 Chronic obstructive pulmonary disease with (acute) exacerbation: Secondary | ICD-10-CM | POA: Diagnosis present

## 2013-11-21 DIAGNOSIS — J9611 Chronic respiratory failure with hypoxia: Secondary | ICD-10-CM

## 2013-11-21 DIAGNOSIS — J9610 Chronic respiratory failure, unspecified whether with hypoxia or hypercapnia: Secondary | ICD-10-CM | POA: Diagnosis present

## 2013-11-21 DIAGNOSIS — I959 Hypotension, unspecified: Secondary | ICD-10-CM | POA: Diagnosis not present

## 2013-11-21 DIAGNOSIS — I214 Non-ST elevation (NSTEMI) myocardial infarction: Secondary | ICD-10-CM | POA: Diagnosis present

## 2013-11-21 DIAGNOSIS — I5021 Acute systolic (congestive) heart failure: Secondary | ICD-10-CM

## 2013-11-21 DIAGNOSIS — Z720 Tobacco use: Secondary | ICD-10-CM

## 2013-11-21 DIAGNOSIS — I517 Cardiomegaly: Secondary | ICD-10-CM | POA: Diagnosis not present

## 2013-11-21 DIAGNOSIS — I5023 Acute on chronic systolic (congestive) heart failure: Secondary | ICD-10-CM | POA: Diagnosis present

## 2013-11-21 DIAGNOSIS — J439 Emphysema, unspecified: Secondary | ICD-10-CM | POA: Diagnosis present

## 2013-11-21 DIAGNOSIS — J438 Other emphysema: Secondary | ICD-10-CM

## 2013-11-21 DIAGNOSIS — E43 Unspecified severe protein-calorie malnutrition: Secondary | ICD-10-CM

## 2013-11-21 DIAGNOSIS — I509 Heart failure, unspecified: Secondary | ICD-10-CM | POA: Diagnosis present

## 2013-11-21 DIAGNOSIS — F1010 Alcohol abuse, uncomplicated: Secondary | ICD-10-CM

## 2013-11-21 DIAGNOSIS — E871 Hypo-osmolality and hyponatremia: Secondary | ICD-10-CM

## 2013-11-21 DIAGNOSIS — Z79899 Other long term (current) drug therapy: Secondary | ICD-10-CM

## 2013-11-21 DIAGNOSIS — R079 Chest pain, unspecified: Secondary | ICD-10-CM

## 2013-11-21 DIAGNOSIS — J189 Pneumonia, unspecified organism: Secondary | ICD-10-CM

## 2013-11-21 HISTORY — PX: LEFT HEART CATHETERIZATION WITH CORONARY ANGIOGRAM: SHX5451

## 2013-11-21 HISTORY — DX: Non-ST elevation (NSTEMI) myocardial infarction: I21.4

## 2013-11-21 LAB — CBC WITH DIFFERENTIAL/PLATELET
Basophils Absolute: 0.1 10*3/uL (ref 0.0–0.1)
Basophils Relative: 1 % (ref 0–1)
EOS ABS: 0.5 10*3/uL (ref 0.0–0.7)
Eosinophils Relative: 4 % (ref 0–5)
HEMATOCRIT: 38.5 % — AB (ref 39.0–52.0)
Hemoglobin: 12.7 g/dL — ABNORMAL LOW (ref 13.0–17.0)
LYMPHS ABS: 5 10*3/uL — AB (ref 0.7–4.0)
Lymphocytes Relative: 42 % (ref 12–46)
MCH: 28.6 pg (ref 26.0–34.0)
MCHC: 33 g/dL (ref 30.0–36.0)
MCV: 86.7 fL (ref 78.0–100.0)
Monocytes Absolute: 1.3 10*3/uL — ABNORMAL HIGH (ref 0.1–1.0)
Monocytes Relative: 11 % (ref 3–12)
NEUTROS ABS: 5.1 10*3/uL (ref 1.7–7.7)
Neutrophils Relative %: 42 % — ABNORMAL LOW (ref 43–77)
Platelets: 358 10*3/uL (ref 150–400)
RBC: 4.44 MIL/uL (ref 4.22–5.81)
RDW: 14.1 % (ref 11.5–15.5)
WBC: 12 10*3/uL — ABNORMAL HIGH (ref 4.0–10.5)

## 2013-11-21 LAB — I-STAT TROPONIN, ED
TROPONIN I, POC: 0.48 ng/mL — AB (ref 0.00–0.08)
Troponin i, poc: 0.04 ng/mL (ref 0.00–0.08)

## 2013-11-21 LAB — D-DIMER, QUANTITATIVE: D-Dimer, Quant: 0.28 ug/mL-FEU (ref 0.00–0.48)

## 2013-11-21 LAB — HEPARIN LEVEL (UNFRACTIONATED): HEPARIN UNFRACTIONATED: 0.73 [IU]/mL — AB (ref 0.30–0.70)

## 2013-11-21 LAB — I-STAT CHEM 8, ED
BUN: 11 mg/dL (ref 6–23)
CALCIUM ION: 1.15 mmol/L (ref 1.12–1.23)
Chloride: 97 mEq/L (ref 96–112)
Creatinine, Ser: 1 mg/dL (ref 0.50–1.35)
GLUCOSE: 94 mg/dL (ref 70–99)
HCT: 41 % (ref 39.0–52.0)
HEMOGLOBIN: 13.9 g/dL (ref 13.0–17.0)
Potassium: 4.3 mEq/L (ref 3.7–5.3)
Sodium: 135 mEq/L — ABNORMAL LOW (ref 137–147)
TCO2: 29 mmol/L (ref 0–100)

## 2013-11-21 LAB — TROPONIN I
TROPONIN I: 0.71 ng/mL — AB (ref <0.30)
TROPONIN I: 1.1 ng/mL — AB (ref <0.30)

## 2013-11-21 LAB — TSH: TSH: 0.747 u[IU]/mL (ref 0.350–4.500)

## 2013-11-21 LAB — MRSA PCR SCREENING: MRSA by PCR: NEGATIVE

## 2013-11-21 LAB — PROTIME-INR
INR: 0.97 (ref 0.00–1.49)
PROTHROMBIN TIME: 12.9 s (ref 11.6–15.2)

## 2013-11-21 LAB — LIPID PANEL
CHOLESTEROL: 230 mg/dL — AB (ref 0–200)
HDL: 62 mg/dL (ref >39)
LDL Cholesterol: 159 mg/dL — ABNORMAL HIGH (ref 0–99)
TRIGLYCERIDES: 43 mg/dL (ref <150)
Total CHOL/HDL Ratio: 3.7 RATIO
VLDL: 9 mg/dL (ref 0–40)

## 2013-11-21 LAB — MAGNESIUM: Magnesium: 2 mg/dL (ref 1.5–2.5)

## 2013-11-21 LAB — STREP PNEUMONIAE URINARY ANTIGEN: STREP PNEUMO URINARY ANTIGEN: NEGATIVE

## 2013-11-21 LAB — PHOSPHORUS: Phosphorus: 2.8 mg/dL (ref 2.3–4.6)

## 2013-11-21 LAB — HIV ANTIBODY (ROUTINE TESTING W REFLEX): HIV 1&2 Ab, 4th Generation: NONREACTIVE

## 2013-11-21 SURGERY — LEFT HEART CATHETERIZATION WITH CORONARY ANGIOGRAM
Anesthesia: LOCAL

## 2013-11-21 MED ORDER — NITROGLYCERIN 2 % TD OINT
1.0000 [in_us] | TOPICAL_OINTMENT | Freq: Once | TRANSDERMAL | Status: AC
  Administered 2013-11-21: 1 [in_us] via TOPICAL
  Filled 2013-11-21: qty 1

## 2013-11-21 MED ORDER — NITROGLYCERIN 0.4 MG SL SUBL
0.4000 mg | SUBLINGUAL_TABLET | SUBLINGUAL | Status: AC | PRN
  Administered 2013-11-21 (×3): 0.4 mg via SUBLINGUAL
  Filled 2013-11-21: qty 1

## 2013-11-21 MED ORDER — TIOTROPIUM BROMIDE MONOHYDRATE 18 MCG IN CAPS
18.0000 ug | ORAL_CAPSULE | Freq: Every day | RESPIRATORY_TRACT | Status: DC
  Administered 2013-11-23 – 2013-11-26 (×4): 18 ug via RESPIRATORY_TRACT
  Filled 2013-11-21 (×3): qty 5

## 2013-11-21 MED ORDER — METHYLPREDNISOLONE SODIUM SUCC 125 MG IJ SOLR
60.0000 mg | Freq: Two times a day (BID) | INTRAMUSCULAR | Status: DC
  Administered 2013-11-22 (×2): 60 mg via INTRAVENOUS
  Filled 2013-11-21 (×4): qty 0.96

## 2013-11-21 MED ORDER — IOHEXOL 350 MG/ML SOLN
100.0000 mL | Freq: Once | INTRAVENOUS | Status: AC | PRN
  Administered 2013-11-21: 100 mL via INTRAVENOUS

## 2013-11-21 MED ORDER — ONDANSETRON HCL 4 MG/2ML IJ SOLN: 4.0000 mg | Freq: Four times a day (QID) | INTRAMUSCULAR | Status: DC | PRN

## 2013-11-21 MED ORDER — ASPIRIN 81 MG PO CHEW
CHEWABLE_TABLET | ORAL | Status: AC
  Filled 2013-11-21: qty 1

## 2013-11-21 MED ORDER — TICAGRELOR 90 MG PO TABS
180.0000 mg | ORAL_TABLET | Freq: Once | ORAL | Status: AC
  Administered 2013-11-21: 180 mg via ORAL
  Filled 2013-11-21: qty 2

## 2013-11-21 MED ORDER — SODIUM CHLORIDE 0.9 % IV SOLN: 250.0000 mL | INTRAVENOUS | Status: DC | PRN

## 2013-11-21 MED ORDER — FUROSEMIDE 10 MG/ML IJ SOLN
40.0000 mg | Freq: Two times a day (BID) | INTRAMUSCULAR | Status: DC
  Administered 2013-11-21 – 2013-11-24 (×6): 40 mg via INTRAVENOUS
  Filled 2013-11-21 (×9): qty 4

## 2013-11-21 MED ORDER — ASPIRIN 81 MG PO CHEW
81.0000 mg | CHEWABLE_TABLET | Freq: Every day | ORAL | Status: DC
  Administered 2013-11-22 – 2013-11-26 (×5): 81 mg via ORAL
  Filled 2013-11-21 (×6): qty 1

## 2013-11-21 MED ORDER — NITROGLYCERIN IN D5W 200-5 MCG/ML-% IV SOLN
2.0000 ug/min | INTRAVENOUS | Status: DC
  Administered 2013-11-21: 15 ug/min via INTRAVENOUS
  Administered 2013-11-21: 5 ug/min via INTRAVENOUS
  Filled 2013-11-21 (×2): qty 250

## 2013-11-21 MED ORDER — VITAMIN B-1 100 MG PO TABS
100.0000 mg | ORAL_TABLET | Freq: Every day | ORAL | Status: DC
  Administered 2013-11-21 – 2013-11-26 (×6): 100 mg via ORAL
  Filled 2013-11-21 (×6): qty 1

## 2013-11-21 MED ORDER — FOLIC ACID 1 MG PO TABS
1.0000 mg | ORAL_TABLET | Freq: Every day | ORAL | Status: DC
  Administered 2013-11-21 – 2013-11-26 (×6): 1 mg via ORAL
  Filled 2013-11-21 (×6): qty 1

## 2013-11-21 MED ORDER — METHYLPREDNISOLONE SODIUM SUCC 125 MG IJ SOLR
125.0000 mg | Freq: Once | INTRAMUSCULAR | Status: AC
  Administered 2013-11-21: 125 mg via INTRAVENOUS
  Filled 2013-11-21: qty 2

## 2013-11-21 MED ORDER — SODIUM CHLORIDE 0.9 % IJ SOLN
3.0000 mL | Freq: Two times a day (BID) | INTRAMUSCULAR | Status: DC
  Administered 2013-11-21 – 2013-11-26 (×9): 3 mL via INTRAVENOUS

## 2013-11-21 MED ORDER — LIDOCAINE HCL (PF) 1 % IJ SOLN
INTRAMUSCULAR | Status: AC
  Filled 2013-11-21: qty 30

## 2013-11-21 MED ORDER — CEFTRIAXONE SODIUM 1 G IJ SOLR
1.0000 g | INTRAMUSCULAR | Status: DC
  Administered 2013-11-21: 1 g via INTRAVENOUS
  Filled 2013-11-21 (×2): qty 10

## 2013-11-21 MED ORDER — NITROGLYCERIN 1 MG/10 ML FOR IR/CATH LAB
INTRA_ARTERIAL | Status: AC
  Filled 2013-11-21: qty 10

## 2013-11-21 MED ORDER — BUDESONIDE-FORMOTEROL FUMARATE 160-4.5 MCG/ACT IN AERO
2.0000 | INHALATION_SPRAY | Freq: Two times a day (BID) | RESPIRATORY_TRACT | Status: DC
  Administered 2013-11-21 – 2013-11-26 (×9): 2 via RESPIRATORY_TRACT
  Filled 2013-11-21 (×3): qty 6

## 2013-11-21 MED ORDER — ADULT MULTIVITAMIN W/MINERALS CH
1.0000 | ORAL_TABLET | Freq: Every day | ORAL | Status: DC
  Administered 2013-11-21 – 2013-11-26 (×6): 1 via ORAL
  Filled 2013-11-21 (×6): qty 1

## 2013-11-21 MED ORDER — SODIUM CHLORIDE 0.9 % IJ SOLN: 3.0000 mL | INTRAMUSCULAR | Status: DC | PRN

## 2013-11-21 MED ORDER — HEPARIN (PORCINE) IN NACL 100-0.45 UNIT/ML-% IJ SOLN
700.0000 [IU]/h | INTRAMUSCULAR | Status: DC
  Administered 2013-11-22: 600 [IU]/h via INTRAVENOUS
  Filled 2013-11-21 (×2): qty 250

## 2013-11-21 MED ORDER — AZITHROMYCIN 250 MG PO TABS
500.0000 mg | ORAL_TABLET | Freq: Once | ORAL | Status: AC
  Administered 2013-11-21: 500 mg via ORAL
  Filled 2013-11-21: qty 2

## 2013-11-21 MED ORDER — VERAPAMIL HCL 2.5 MG/ML IV SOLN
INTRAVENOUS | Status: AC
  Filled 2013-11-21: qty 2

## 2013-11-21 MED ORDER — TICAGRELOR 90 MG PO TABS
180.0000 mg | ORAL_TABLET | Freq: Once | ORAL | Status: DC
  Filled 2013-11-21: qty 2

## 2013-11-21 MED ORDER — PANTOPRAZOLE SODIUM 40 MG PO TBEC
40.0000 mg | DELAYED_RELEASE_TABLET | Freq: Every day | ORAL | Status: DC
  Administered 2013-11-21 – 2013-11-25 (×5): 40 mg via ORAL
  Filled 2013-11-21 (×6): qty 1

## 2013-11-21 MED ORDER — SODIUM CHLORIDE 0.9 % IV SOLN: 1.0000 mL/kg/h | INTRAVENOUS | Status: DC

## 2013-11-21 MED ORDER — MORPHINE SULFATE 4 MG/ML IJ SOLN
4.0000 mg | Freq: Once | INTRAMUSCULAR | Status: AC
  Administered 2013-11-21: 4 mg via INTRAVENOUS
  Filled 2013-11-21: qty 1

## 2013-11-21 MED ORDER — SODIUM CHLORIDE 0.9 % IJ SOLN: 3.0000 mL | Freq: Two times a day (BID) | INTRAMUSCULAR | Status: DC

## 2013-11-21 MED ORDER — ACETAMINOPHEN 325 MG PO TABS: 650.0000 mg | ORAL_TABLET | ORAL | Status: DC | PRN

## 2013-11-21 MED ORDER — MORPHINE SULFATE 2 MG/ML IJ SOLN
2.0000 mg | INTRAMUSCULAR | Status: DC | PRN
  Administered 2013-11-21 – 2013-11-26 (×18): 2 mg via INTRAVENOUS
  Filled 2013-11-21 (×18): qty 1

## 2013-11-21 MED ORDER — IPRATROPIUM-ALBUTEROL 0.5-2.5 (3) MG/3ML IN SOLN
3.0000 mL | RESPIRATORY_TRACT | Status: DC
  Administered 2013-11-21: 3 mL via RESPIRATORY_TRACT
  Filled 2013-11-21: qty 3

## 2013-11-21 MED ORDER — ASPIRIN 81 MG PO CHEW
81.0000 mg | CHEWABLE_TABLET | ORAL | Status: AC
  Administered 2013-11-21: 81 mg via ORAL

## 2013-11-21 MED ORDER — DEXTROSE 5 % IV SOLN
1.0000 g | Freq: Once | INTRAVENOUS | Status: AC
  Administered 2013-11-21: 1 g via INTRAVENOUS
  Filled 2013-11-21: qty 10

## 2013-11-21 MED ORDER — FENTANYL CITRATE 0.05 MG/ML IJ SOLN
INTRAMUSCULAR | Status: AC
  Filled 2013-11-21: qty 2

## 2013-11-21 MED ORDER — HEPARIN BOLUS VIA INFUSION
4000.0000 [IU] | Freq: Once | INTRAVENOUS | Status: AC
  Administered 2013-11-21: 4000 [IU] via INTRAVENOUS
  Filled 2013-11-21: qty 4000

## 2013-11-21 MED ORDER — ALBUTEROL SULFATE (2.5 MG/3ML) 0.083% IN NEBU
5.0000 mg | INHALATION_SOLUTION | Freq: Once | RESPIRATORY_TRACT | Status: AC
  Administered 2013-11-21: 5 mg via RESPIRATORY_TRACT
  Filled 2013-11-21: qty 6

## 2013-11-21 MED ORDER — HEPARIN (PORCINE) IN NACL 2-0.9 UNIT/ML-% IJ SOLN
INTRAMUSCULAR | Status: AC
  Filled 2013-11-21: qty 1000

## 2013-11-21 MED ORDER — SODIUM CHLORIDE 0.9 % IV SOLN
1.0000 mL/kg/h | INTRAVENOUS | Status: DC
  Administered 2013-11-21: 1 mL/kg/h via INTRAVENOUS

## 2013-11-21 MED ORDER — DEXTROSE 5 % IV SOLN
500.0000 mg | INTRAVENOUS | Status: DC
  Administered 2013-11-21 – 2013-11-23 (×3): 500 mg via INTRAVENOUS
  Filled 2013-11-21 (×3): qty 500

## 2013-11-21 MED ORDER — IPRATROPIUM-ALBUTEROL 0.5-2.5 (3) MG/3ML IN SOLN
3.0000 mL | Freq: Three times a day (TID) | RESPIRATORY_TRACT | Status: DC
  Administered 2013-11-21 – 2013-11-26 (×15): 3 mL via RESPIRATORY_TRACT
  Filled 2013-11-21 (×15): qty 3

## 2013-11-21 MED ORDER — TICAGRELOR 90 MG PO TABS
90.0000 mg | ORAL_TABLET | Freq: Two times a day (BID) | ORAL | Status: DC
  Administered 2013-11-22 – 2013-11-26 (×9): 90 mg via ORAL
  Filled 2013-11-21 (×12): qty 1

## 2013-11-21 MED ORDER — SODIUM CHLORIDE 0.9 % IJ SOLN
3.0000 mL | Freq: Two times a day (BID) | INTRAMUSCULAR | Status: DC
  Administered 2013-11-22: 3 mL via INTRAVENOUS

## 2013-11-21 MED ORDER — HEPARIN (PORCINE) IN NACL 100-0.45 UNIT/ML-% IJ SOLN
700.0000 [IU]/h | INTRAMUSCULAR | Status: DC
  Administered 2013-11-21: 700 [IU]/h via INTRAVENOUS
  Filled 2013-11-21: qty 250

## 2013-11-21 MED ORDER — SODIUM CHLORIDE 0.9 % IV SOLN
1.0000 mL/kg/h | INTRAVENOUS | Status: DC
Start: 2013-11-22 — End: 2013-11-22

## 2013-11-21 MED ORDER — ATORVASTATIN CALCIUM 40 MG PO TABS
40.0000 mg | ORAL_TABLET | Freq: Every day | ORAL | Status: DC
  Administered 2013-11-21 – 2013-11-22 (×2): 40 mg via ORAL
  Filled 2013-11-21 (×2): qty 1

## 2013-11-21 MED ORDER — MIDAZOLAM HCL 2 MG/2ML IJ SOLN
INTRAMUSCULAR | Status: AC
  Filled 2013-11-21: qty 2

## 2013-11-21 MED ORDER — HEPARIN (PORCINE) IN NACL 100-0.45 UNIT/ML-% IJ SOLN
650.0000 [IU]/h | INTRAMUSCULAR | Status: DC
  Filled 2013-11-21: qty 250

## 2013-11-21 MED ORDER — METOPROLOL TARTRATE 25 MG PO TABS
25.0000 mg | ORAL_TABLET | Freq: Two times a day (BID) | ORAL | Status: DC
  Administered 2013-11-21 – 2013-11-24 (×6): 25 mg via ORAL
  Filled 2013-11-21 (×7): qty 1

## 2013-11-21 NOTE — Progress Notes (Addendum)
CRITICAL VALUE ALERT  Critical value received: Troponin 0.71   Date of notification:  11/21/2013  Time of notification:  4469  Critical value read back:Yes.    Nurse who received alert:  Glee Arvin  MD notified (1st page):  Dr. Ree Kida  Time of first page: 1420  MD notified (2nd page):  Time of second page:  Responding MD:  Dr.Mikhail Time MD responded:

## 2013-11-21 NOTE — ED Provider Notes (Addendum)
CSN: [id number]     Arrival date & time 11/21/13  0205 History   First MD Initiated Contact with Patient 11/21/13 [phone]     Chief Complaint  Patient presents with  . Chest Pain  . Shortness of Breath     (Consider location/radiation/quality/duration/timing/severity/associated sxs/prior Treatment) HPI Comments: Patient presents with chest pain. He states he's had chest pain for the last 2 days. He states it's across his chest it has been constant for the last 2 days. It was radiating to both his arms but now it's just in his chest. It's nonexertional. It's been worsening over the last 2 days. He has associated worsening shortness of breath and some dizziness. He does have a history of COPD and is oxygen dependent. He is on 3 L by nasal cannula. He also has history of hyperlipidemia. He denies any leg pain or swelling. He was placed on a nonrebreather mask by EMS due to low oxygen saturations in the mid-80s. He was also given a nebulizer treatment by EMS and sublingual nitroglycerin. The patient does say that the sublingual nitroglycerin did help his pain. He denies a known history of heart problems. He does complain of a cough that's a little bit worse than his baseline. It's productive of clear sputum  Patient is a 58 y.o. male presenting with chest pain and shortness of breath.  Chest Pain Associated symptoms: cough and shortness of breath   Associated symptoms: no abdominal pain, no back pain, no diaphoresis, no dizziness, no fatigue, no fever, no headache, no nausea, no numbness, not vomiting and no weakness   Shortness of Breath Associated symptoms: chest pain and cough   Associated symptoms: no abdominal pain, no diaphoresis, no fever, no headaches, no rash and no vomiting     Past Medical History  Diagnosis Date  . Thyroid disease     hyper, not on meds  . COPD (chronic obstructive pulmonary disease)   . Hodgkin's disease 1993    with abdominal surgical scar -pt unable to describe    . Spontaneous pneumothorax     left  . Spontaneous pneumothorax     right  . Pneumonia   . Chronic respiratory failure   . Anxiety state, unspecified   . Depressive disorder, not elsewhere classified   . Chronic airway obstruction, not elsewhere classified   . Unspecified constipation   . Lumbago   . Other malaise and fatigue   . Headache(784.0)   . Other dyspnea and respiratory abnormality   . Cough   . Diarrhea   . Weight loss    Past Surgical History  Procedure Laterality Date  . Splenectomy  1987  . Chest tube insertion      left  . Chest tube insertion      right  . Teeth extracted for dentures  2012   Family History  Problem Relation Age of Onset  . Cancer Mother 83  . Pneumonia Father   . Cancer Sister   . Stroke Sister    History  Substance Use Topics  . Smoking status: Current Every Day Smoker -- 0.25 packs/day for 41 years    Types: Cigarettes  . Smokeless tobacco: Current User     Comment: nothinhg will work. Tried everything 09/24/12--5 cigs per day  . Alcohol Use: 3.6 oz/week    6 Cans of beer per week     Comment: socially    Review of Systems  Constitutional: Negative for fever, chills, diaphoresis and fatigue.  HENT: Negative  for congestion, rhinorrhea and sneezing.   Eyes: Negative.   Respiratory: Positive for cough and shortness of breath. Negative for chest tightness.   Cardiovascular: Positive for chest pain. Negative for leg swelling.  Gastrointestinal: Negative for nausea, vomiting, abdominal pain, diarrhea and blood in stool.  Genitourinary: Negative for frequency, hematuria, flank pain and difficulty urinating.  Musculoskeletal: Negative for arthralgias and back pain.  Skin: Negative for rash.  Neurological: Negative for dizziness, speech difficulty, weakness, numbness and headaches.      Allergies  Codeine  Home Medications   Prior to Admission medications   Medication Sig Start Date End Date Taking? Authorizing Provider   albuterol (PROAIR HFA) 108 (90 BASE) MCG/ACT inhaler Inhale 2 puffs into the lungs every 6 (six) hours as needed for wheezing or shortness of breath. 03/21/13  Yes Chesley Mires, MD  albuterol (PROVENTIL) (2.5 MG/3ML) 0.083% nebulizer solution Take 3 mLs (2.5 mg total) by nebulization every 6 (six) hours as needed for wheezing or shortness of breath. 05/23/13  Yes Chesley Mires, MD  atorvastatin (LIPITOR) 40 MG tablet Take 40 mg by mouth daily.   Yes Historical Provider, MD  budesonide-formoterol (SYMBICORT) 160-4.5 MCG/ACT inhaler Inhale 2 puffs into the lungs 2 (two) times daily.   Yes Historical Provider, MD  omeprazole (PRILOSEC) 20 MG capsule Take 20 mg by mouth daily.   Yes Historical Provider, MD  sildenafil (VIAGRA) 25 MG tablet Take 1 tablet (25 mg total) by mouth daily as needed for erectile dysfunction. 09/12/13  Yes Tiffany L Reed, DO  tiotropium (SPIRIVA) 18 MCG inhalation capsule Place 18 mcg into inhaler and inhale daily.   Yes Historical Provider, MD  traMADol (ULTRAM) 50 MG tablet Take 100 mg by mouth every 6 (six) hours as needed for moderate pain.   Yes Historical Provider, MD   BP 164/85  Pulse 99  Temp(Src) 98.5 F (36.9 C) (Oral)  Resp 17  Ht 5\' 7"  (1.702 m)  Wt 120 lb (54.432 kg)  BMI 18.79 kg/m2  SpO2 99% Physical Exam  Constitutional: He is oriented to person, place, and time. He appears well-developed and well-nourished.  HENT:  Head: Normocephalic and atraumatic.  Mouth/Throat: Oropharynx is clear and moist.  Eyes: Pupils are equal, round, and reactive to light.  Neck: Normal range of motion. Neck supple.  Cardiovascular: Normal rate, regular rhythm and normal heart sounds.   Pulmonary/Chest: Effort normal. No respiratory distress. He has wheezes. He has no rales. He exhibits no tenderness.  Abdominal: Soft. Bowel sounds are normal. There is no tenderness. There is no rebound and no guarding.  Musculoskeletal: Normal range of motion. He exhibits no edema.  No calf  tenderness  Lymphadenopathy:    He has no cervical adenopathy.  Neurological: He is alert and oriented to person, place, and time.  Skin: Skin is warm and dry. No rash noted.  Psychiatric: He has a normal mood and affect.    ED Course  Procedures (including critical care time) Labs Review Labs Reviewed  CBC WITH DIFFERENTIAL - Abnormal; Notable for the following:    WBC 12.0 (*)    Hemoglobin 12.7 (*)    HCT 38.5 (*)    Neutrophils Relative % 42 (*)    Lymphs Abs 5.0 (*)    Monocytes Absolute 1.3 (*)    All other components within normal limits  I-STAT CHEM 8, ED - Abnormal; Notable for the following:    Sodium 135 (*)    All other components within normal limits  D-DIMER,  QUANTITATIVE  I-STAT TROPOININ, ED  Randolm Idol, ED    Imaging Review Dg Chest 2 View  11/21/2013   CLINICAL DATA:  Chest pain and shortness of breath.  EXAM: CHEST  2 VIEW  COMPARISON:  Chest radiograph Sep 24, 2012  FINDINGS: Cardiomediastinal silhouette is nonsuspicious, mildly calcified aortic knob. Left apical bullous changes, pleural thickening and scarring right lung apex. COPD with slightly increasing right middle lobe airspace opacity. No pleural effusions. No pneumothorax.  Remote left clavicle fracture.  Surgical clips at the GE junction.  IMPRESSION: COPD and bullous changes with increasing strandy densities in right middle lobe which could reflect pneumonia though, recommend followup chest radiograph after treatment to verify improvement.   Electronically Signed   By: Elon Alas   On: 11/21/2013 03:52   Ct Angio Chest Pe W/cm &/or Wo Cm  11/21/2013   CLINICAL DATA:  Assess aorta. Chest pain, shortness of breath. History of VATS.  EXAM: CT ANGIOGRAPHY CHEST WITH CONTRAST  TECHNIQUE: Multidetector CT imaging of the chest was performed using the standard protocol during bolus administration of intravenous contrast. Multiplanar CT image reconstructions and MIPs were obtained to evaluate the  vascular anatomy.  CONTRAST:  115mL OMNIPAQUE IOHEXOL 350 MG/ML SOLN  COMPARISON:  Chest radiograph November 21, 2013 and CT of the chest April 01, 2012  FINDINGS: Noncontrast imaging of aorta shows homogeneous density, mild to moderate calcific atherosclerosis. Ascending aorta is normal in course caliber. Moderate calcific atherosclerosis of the carotid bulb with eccentric intimal thickening propagating into the origin of the left subclavian artery, measuring up to 6 mm, narrowing the true lumen by less than 50%. Descending aorta is normal in course and caliber, mild luminal regularity data intimal thickening and calcific atherosclerosis. No dissection, aneurysm, contrast extravasation, periaortic fluid collections. Diffuse bronchial wall thickening. Though not tailored for evaluation, no central pulmonary arterial filling defects.  Severe bilateral apical bullous changes in a background of severe centrilobular emphysema. Large right lung base bulla with superimposed right lower lobe hypoenhancing consolidation, axial 62/93. No pleural effusions. Right apical pleural thickening and scarring. No pneumothorax.  Tracheobronchial tree is patent and midline. No lymphadenopathy by CT size criteria. Thoracic esophagus is unremarkable. Included view of the abdomen is nonsuspicious. 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  Stable 15 mm dominant thyroid lesion at the level of the isthmus. Mild degenerative change of the thoracic spine, scattered Schmorl's nodes.  Review of the MIP images confirms the above findings.  IMPRESSION: No acute vascular process, specifically no aortic dissection.  Peribronchial wall thickening which likely reflect bronchitis. Severe emphysema with similar right lung base presumed atelectasis and right apical pleural thickening/scarring.  Stable 15 mm dominant isthmus thyroid lesion for which follow-up ultrasound is recommended on a nonemergent basis.   Electronically Signed   By:  Elon Alas   On: 11/21/2013 06:37     EKG Interpretation   Date/Time:  Thursday November 21 2013 02:15:18 EDT Ventricular Rate:  105 PR Interval:  109 QRS Duration: 80 QT Interval:  361 QTC Calculation: 477 R Axis:   83 Text Interpretation:  Sinus tachycardia Nonspecific repol abnormality,  diffuse leads Borderline prolonged QT interval since last tracing no  significant change other than slight ST depression laterally Confirmed by  Raeden Belzer  MD, Jumana Paccione ([id number]) on 11/21/2013 4:10:17 AM      MDM   Final diagnoses:  Chest pain, unspecified chest pain type  COPD exacerbation    Patient presents with chest pain and shortness  of breath. His EKG did not show ischemic changes other than some very slight ST depression laterally. His troponin is negative. He does have episodes where he feels like his pain gets markedly worse he starts rocking back and forth. He was given nitroglycerin which did seem to improve his pain. He states he has not taken his Viagra in about 2 weeks. Given his amount of pain, I did do a CT angiogram of his chest to assess his aorta and this was negative for dissection or other abnormality. His d-dimer is negative for PE. His chest x-ray shows a questionable area of pneumonia so he was started on antibiotics for possible community-acquired pneumonia. I feel that he does need to be admitted for further cardiac evaluation. I will consult hospitalist for admission. Patient denies any past history of heart problems.  He denies having a cardiologist.  6:59  I checked a repeat troponin which was slightly elevated.  Will consult cardiology.  Repeat EKG shows same changes, only change from old EKG is slight ST depression laterally.   Pt states that he was given 4 baby ASA by EMS.  He has been given 3 nitro by EMS and 3 here.  He says that the nitro is the only thing that relieves his pain.  He keeps asking for more.  I tried nito ointment which did not help.  Will start on nitro  drip.  I spoke with Dr. Irish Lack who will arrange for a cath this am.  Will start heparin per pharmacy.  Spoke with hospitalist who will admit.  CRITICAL CARE Performed by: Gera Inboden Total critical care time: 60 Critical care time was exclusive of separately billable procedures and treating other patients. Critical care was necessary to treat or prevent imminent or life-threatening deterioration. Critical care was time spent personally by me on the following activities: development of treatment plan with patient and/or surrogate as well as nursing, discussions with consultants, evaluation of patient's response to treatment, examination of patient, obtaining history from patient or surrogate, ordering and performing treatments and interventions, ordering and review of laboratory studies, ordering and review of radiographic studies, pulse oximetry and re-evaluation of patient's condition.     Malvin Johns, MD 11/21/13 [id number]  Malvin Johns, MD 11/21/13 0700  Malvin Johns, MD 11/21/13 [place], MD 11/21/13 8016

## 2013-11-21 NOTE — CV Procedure (Signed)
    Cardiac Catheterization Procedure Note  Name: Philip Coleman MRN: [id number] DOB: 09/04/1955  Procedure: Left Heart Cath, Selective Coronary Angiography, LV angiography  Indication: 58 yo WM with multiple medical problems presents with a NSTEMI.   Procedural Details: The right wrist was prepped, draped, and anesthetized with 1% lidocaine. Using the modified Seldinger technique, a 6 French slender sheath was introduced into the right radial artery. 3 mg of verapamil was administered through the sheath, weight-based unfractionated heparin was administered intravenously. Standard Judkins catheters were used for selective coronary angiography and left ventriculography. Catheter exchanges were performed over an exchange length guidewire. There were no immediate procedural complications. A TR band was used for radial hemostasis at the completion of the procedure.  The patient was transferred to the post catheterization recovery area for further monitoring.  Procedural Findings: Hemodynamics: AO 153/91 mean 121 mm Hg LV 153/28 mm Hg  Coronary angiography: Coronary dominance: right  Left mainstem: The left main is short and calcified with a 50% distal stenosis.  Left anterior descending (LAD): The LAD is a large vessel that wraps around the apex. There is a 90-95% stenosis in the proximal vessel. The first diagonal is a moderate sized vessel and has a high takeoff. It bifurcates proximally and is subtotally occluded at the origin. The second diagonal is small with 95% stenosis proximally.  Left circumflex (LCx): The LCx has a 90% mid vessel stenosis after the takeoff of a small OM1. The distal LCx divides into 2 OM branches.  Right coronary artery (RCA): The RCA has mild disease less than 20%.   Left ventriculography: Left ventricular systolic function is abnormal, LVEF is estimated at 35-40%. There is moderate to severe hypokinesis of the mid to distal anterior wall, apex, and distal  inferior wall. There is no significant mitral regurgitation   Final Conclusions:   1. Severe 2 vessel obstructive CAD 2. Moderate LV dysfunction 3. Elevated LVEDP.  Recommendations: This patient has complex 2 vessel obstructive CAD. He has modest distal left main disease. Given his overall medical condition I think his risk for CABG is probable prohibitive. Will review with interventional colleagues and consider complex PCI of the LAD and LCx lesions. Need to optimize volume status with diuresis and improve BP control.   Philip Coleman, [place]  11/21/2013, 4:56 PM

## 2013-11-21 NOTE — Consult Note (Addendum)
CARDIOLOGY CONSULT NOTE      Patient ID: Philip Coleman MRN: [id number] DOB/AGE: December 15, 1955 58 y.o.  Admit date: 11/21/2013 Referring Physician Dr. Tamera Punt Primary Physician [facility] Primary Cardiologist new Reason for Consultation chest pain  HPI: 31 history of severe COPD. He does use oxygen at home. Over the past week, he has had intermittent chest discomfort. Earlier today, he became more severe. It did not go away. He came to the emergency room. He has had relief with nitroglycerin. Feels like a pressure in the center of his chest. It is completely gone at this point. He does have some epigastric pain as well. Pain improved with sublingual nitroglycerin. It was gone with IV nitroglycerin.  Review of systems complete and found to be negative unless listed above   Past Medical History  Diagnosis Date  . Thyroid disease     hyper, not on meds  . COPD (chronic obstructive pulmonary disease)   . Hodgkin's disease 1993    with abdominal surgical scar -pt unable to describe   . Spontaneous pneumothorax     left  . Spontaneous pneumothorax     right  . Pneumonia   . Chronic respiratory failure   . Anxiety state, unspecified   . Depressive disorder, not elsewhere classified   . Chronic airway obstruction, not elsewhere classified   . Unspecified constipation   . Lumbago   . Other malaise and fatigue   . Headache(784.0)   . Other dyspnea and respiratory abnormality   . Cough   . Diarrhea   . Weight loss     Family History  Problem Relation Age of Onset  . Cancer Mother 68  . Pneumonia Father   . Cancer Sister   . Stroke Sister     History   Social History  . Marital Status: Legally Separated    Spouse Name: N/A    Number of Children: N/A  . Years of Education: N/A   Occupational History  . Not on file.   Social History Main Topics  . Smoking status: Current Every Day Smoker -- 0.25 packs/day for 41 years    Types: Cigarettes  . Smokeless tobacco:  Current User     Comment: nothinhg will work. Tried everything 09/24/12--5 cigs per day  . Alcohol Use: 3.6 oz/week    6 Cans of beer per week     Comment: socially  . Drug Use: No  . Sexual Activity: Yes    Birth Control/ Protection: Condom     Comment: perfer not to say    Other Topics Concern  . Not on file   Social History Narrative  . No narrative on file    Past Surgical History  Procedure Laterality Date  . Splenectomy  1987  . Chest tube insertion      left  . Chest tube insertion      right  . Teeth extracted for dentures  2012      (Not in a hospital admission)  Physical Exam: Vitals:   Filed Vitals:   11/21/13 0732 11/21/13 0745 11/21/13 0800 11/21/13 0820  BP: 135/82 133/80 127/70 127/70  Pulse: 102 100 102 106  Temp:      TempSrc:      Resp: 20 20 20 16   Height:      Weight:      SpO2: 98% 100% 100% 99%   I&O's:   Intake/Output Summary (Last 24 hours) at 11/21/13 0836 Last data filed at 11/21/13 [phone]  Gross per 24 hour  Intake      0 ml  Output    250 ml  Net   -250 ml   Physical exam:  [place]/AT frail EOMI No JVD, No carotid bruit RRR S1S2  No wheezing Soft. NT, nondistended No edema. No focal motor or sensory deficits Normal affect  Labs:   Lab Results  Component Value Date   WBC 12.0* 11/21/2013   HGB 13.9 11/21/2013   HCT 41.0 11/21/2013   MCV 86.7 11/21/2013   PLT 358 11/21/2013    Recent Labs Lab 11/21/13 0323  NA 135*  K 4.3  CL 97  BUN 11  CREATININE 1.00  GLUCOSE 94   Lab Results  Component Value Date   CKTOTAL 38 12/25/2011   CKMB 1.8 12/25/2011   TROPONINI 0.67* 04/02/2012    Lab Results  Component Value Date   CHOL 143 04/02/2012   CHOL 186 01/06/2011   Lab Results  Component Value Date   HDL 53 09/09/2013   HDL 70 04/02/2012   HDL 68 01/06/2011   Lab Results  Component Value Date   LDLCALC 120* 09/09/2013   LDLCALC 62 04/02/2012   LDLCALC 93 01/06/2011   Lab Results  Component Value Date   TRIG 152*  09/09/2013   TRIG 57 04/02/2012   TRIG 123 01/06/2011   Lab Results  Component Value Date   CHOLHDL 3.8 09/09/2013   CHOLHDL 2.0 04/02/2012   CHOLHDL 2.7 01/06/2011   No results found for this basename: LDLDIRECT      Radiology:? pneumonia EKG:NSR, NSST  ASSESSMENT AND PLAN:   Non-STEMI: Start IV heparin. Continue IV nitroglycerin. Plan for cardiac cath. The procedure was explained to the patient and he is agreeable. He has not had any significant bleeding problems. He states that he will take dual antiplatelet therapy for a year if needed.  Further plans based on results of cardiac cath.  Continue lipid-lowering therapy.  He needs to stop smoking.  Signed:   Mina Marble, MD, [facility] 11/21/2013, 8:36 AM

## 2013-11-21 NOTE — Progress Notes (Signed)
ANTICOAGULATION CONSULT NOTE - Initial Consult  Pharmacy Consult for heparin  Indication: chest pain/ACS  Allergies  Allergen Reactions  . Codeine Hives and Nausea And Vomiting    Patient Measurements: Height: 5\' 7"  (170.2 cm) Weight: 120 lb (54.432 kg) IBW/kg (Calculated) : 66.1 Heparin Dosing Weight:   Vital Signs: Temp: 98.7 F (37.1 C) (07/16 1133) Temp src: Oral (07/16 0635) BP: 137/87 mmHg (07/16 1133) Pulse Rate: 101 (07/16 1133)  Labs:  Recent Labs  11/21/13 0313 11/21/13 0323 11/21/13 1305  HGB 12.7* 13.9  --   HCT 38.5* 41.0  --   PLT 358  --   --   LABPROT  --   --  12.9  INR  --   --  0.97  HEPARINUNFRC  --   --  0.73*  CREATININE  --  1.00  --     Estimated Creatinine Clearance: 63.5 ml/min (by C-G formula based on Cr of 1).   Medical History: Past Medical History  Diagnosis Date  . Thyroid disease     hyper, not on meds  . COPD (chronic obstructive pulmonary disease)   . Hodgkin's disease 1993    with abdominal surgical scar -pt unable to describe   . Spontaneous pneumothorax     left  . Spontaneous pneumothorax     right  . Pneumonia   . Chronic respiratory failure   . Anxiety state, unspecified   . Depressive disorder, not elsewhere classified   . Chronic airway obstruction, not elsewhere classified   . Unspecified constipation   . Lumbago   . Other malaise and fatigue   . Headache(784.0)   . Other dyspnea and respiratory abnormality   . Cough   . Diarrhea   . Weight loss     Medications:  Takes viagra 25mg  tablets --no anticoags listed.  Assessment: Chest pain x2 days  Non-exertional. COPD on 3L 02. Cath for today per varnasi. First level came back only slightly high.    Goal of Therapy:  Heparin level 0.3-0.7 units/ml Monitor platelets by anticoagulation protocol: Yes   Plan:   Decrease heparin to 650 units/hr F/u with another level or cath

## 2013-11-21 NOTE — ED Notes (Addendum)
Cardiology at bedside states chest pain resolved to Cardiologist and states and points to RUQ abdominal pain 3-4 dull pain. Cardiologist states patient will have heart cath today.

## 2013-11-21 NOTE — ED Notes (Signed)
Called Cath Lab stated patient will have cath approximately today at 1700.

## 2013-11-21 NOTE — ED Notes (Signed)
Pt brought to the ER by [facility] from home c/o CP 10/10 while watching TV,  that pt describe as pressure on his mid chest with radiation to bilateral arms. Pt states feeling SOB and dizziness with the pain. Nitro SL x 3 given by [facility], one Albuterol neb and 500 cc bolus given. Pt states pain decreases to 8/10 after medication. Pt on a NRB mask for O2 saturation dropping on the 86% BP soft by EMS 96/56 before bolus up to 116/69 after fluids. Pt has Hx of COPD with O2 3L home dependent.

## 2013-11-21 NOTE — H&P (View-Only) (Signed)
CARDIOLOGY CONSULT NOTE      Patient ID: Philip Coleman MRN: [id number] DOB/AGE: 56-Sep-1957 56 y.o.  Admit date: 11/21/2013 Referring Physician Dr. Tamera Punt Primary Physician [facility] Primary Cardiologist new Reason for Consultation chest pain  HPI: 14 history of severe COPD. He does use oxygen at home. Over the past week, he has had intermittent chest discomfort. Earlier today, he became more severe. It did not go away. He came to the emergency room. He has had relief with nitroglycerin. Feels like a pressure in the center of his chest. It is completely gone at this point. He does have some epigastric pain as well. Pain improved with sublingual nitroglycerin. It was gone with IV nitroglycerin.  Review of systems complete and found to be negative unless listed above   Past Medical History  Diagnosis Date  . Thyroid disease     hyper, not on meds  . COPD (chronic obstructive pulmonary disease)   . Hodgkin's disease 1993    with abdominal surgical scar -pt unable to describe   . Spontaneous pneumothorax     left  . Spontaneous pneumothorax     right  . Pneumonia   . Chronic respiratory failure   . Anxiety state, unspecified   . Depressive disorder, not elsewhere classified   . Chronic airway obstruction, not elsewhere classified   . Unspecified constipation   . Lumbago   . Other malaise and fatigue   . Headache(784.0)   . Other dyspnea and respiratory abnormality   . Cough   . Diarrhea   . Weight loss     Family History  Problem Relation Age of Onset  . Cancer Mother 16  . Pneumonia Father   . Cancer Sister   . Stroke Sister     History   Social History  . Marital Status: Legally Separated    Spouse Name: N/A    Number of Children: N/A  . Years of Education: N/A   Occupational History  . Not on file.   Social History Main Topics  . Smoking status: Current Every Day Smoker -- 0.25 packs/day for 41 years    Types: Cigarettes  . Smokeless tobacco:  Current User     Comment: nothinhg will work. Tried everything 09/24/12--5 cigs per day  . Alcohol Use: 3.6 oz/week    6 Cans of beer per week     Comment: socially  . Drug Use: No  . Sexual Activity: Yes    Birth Control/ Protection: Condom     Comment: perfer not to say    Other Topics Concern  . Not on file   Social History Narrative  . No narrative on file    Past Surgical History  Procedure Laterality Date  . Splenectomy  1987  . Chest tube insertion      left  . Chest tube insertion      right  . Teeth extracted for dentures  2012      (Not in a hospital admission)  Physical Exam: Vitals:   Filed Vitals:   11/21/13 0732 11/21/13 0745 11/21/13 0800 11/21/13 0820  BP: 135/82 133/80 127/70 127/70  Pulse: 102 100 102 106  Temp:      TempSrc:      Resp: 20 20 20 16   Height:      Weight:      SpO2: 98% 100% 100% 99%   I&O's:   Intake/Output Summary (Last 24 hours) at 11/21/13 0836 Last data filed at 11/21/13 [phone]  Gross per 24 hour  Intake      0 ml  Output    250 ml  Net   -250 ml   Physical exam:  [place]/AT frail EOMI No JVD, No carotid bruit RRR S1S2  No wheezing Soft. NT, nondistended No edema. No focal motor or sensory deficits Normal affect  Labs:   Lab Results  Component Value Date   WBC 12.0* 11/21/2013   HGB 13.9 11/21/2013   HCT 41.0 11/21/2013   MCV 86.7 11/21/2013   PLT 358 11/21/2013    Recent Labs Lab 11/21/13 0323  NA 135*  K 4.3  CL 97  BUN 11  CREATININE 1.00  GLUCOSE 94   Lab Results  Component Value Date   CKTOTAL 38 12/25/2011   CKMB 1.8 12/25/2011   TROPONINI 0.67* 04/02/2012    Lab Results  Component Value Date   CHOL 143 04/02/2012   CHOL 186 01/06/2011   Lab Results  Component Value Date   HDL 53 09/09/2013   HDL 70 04/02/2012   HDL 68 01/06/2011   Lab Results  Component Value Date   LDLCALC 120* 09/09/2013   LDLCALC 62 04/02/2012   LDLCALC 93 01/06/2011   Lab Results  Component Value Date   TRIG 152*  09/09/2013   TRIG 57 04/02/2012   TRIG 123 01/06/2011   Lab Results  Component Value Date   CHOLHDL 3.8 09/09/2013   CHOLHDL 2.0 04/02/2012   CHOLHDL 2.7 01/06/2011   No results found for this basename: LDLDIRECT      Radiology:? pneumonia EKG:NSR, NSST  ASSESSMENT AND PLAN:   Non-STEMI: Start IV heparin. Continue IV nitroglycerin. Plan for cardiac cath. The procedure was explained to the patient and he is agreeable. He has not had any significant bleeding problems. He states that he will take dual antiplatelet therapy for a year if needed.  Further plans based on results of cardiac cath.  Continue lipid-lowering therapy.  He needs to stop smoking.  Signed:   Mina Marble, MD, [facility] 11/21/2013, 8:36 AM

## 2013-11-21 NOTE — ED Notes (Signed)
Dr. Tamera Punt notified about pt's CP and SOB and low O2 Saturation on [place]. Pt changed again to the [facility]

## 2013-11-21 NOTE — Interval H&P Note (Signed)
History and Physical Interval Note:  11/21/2013 4:17 PM  Philip Coleman  has presented today for surgery, with the diagnosis of cp  The various methods of treatment have been discussed with the patient and family. After consideration of risks, benefits and other options for treatment, the patient has consented to  Procedure(s): LEFT HEART CATHETERIZATION WITH CORONARY ANGIOGRAM (N/A) as a surgical intervention .  The patient's history has been reviewed, patient examined, no change in status, stable for surgery.  I have reviewed the patient's chart and labs.  Questions were answered to the patient's satisfaction.    Cath Lab Visit (complete for each Cath Lab visit)  Clinical Evaluation Leading to the Procedure:   ACS: Yes.    Non-ACS:    Anginal Classification: CCS IV  Anti-ischemic medical therapy: No Therapy  Non-Invasive Test Results: No non-invasive testing performed  Prior CABG: No previous CABG       [name] [facility] 11/21/2013 4:17 PM

## 2013-11-21 NOTE — Progress Notes (Signed)
ANTICOAGULATION CONSULT NOTE - Follow Up Consult  Pharmacy Consult for Heparin Indication: chest pain/ACS  Allergies  Allergen Reactions  . Codeine Hives and Nausea And Vomiting    Patient Measurements: Height: 5\' 7"  (170.2 cm) Weight: 120 lb (54.432 kg) IBW/kg (Calculated) : 66.1 Heparin Dosing Weight: 54.4 kg  Vital Signs: Temp: 98.7 F (37.1 C) (07/16 1133) Temp src: Oral (07/16 0635) BP: 137/87 mmHg (07/16 1133) Pulse Rate: 110 (07/16 1633)  Labs:  Recent Labs  11/21/13 0313 11/21/13 0323 11/21/13 1305  HGB 12.7* 13.9  --   HCT 38.5* 41.0  --   PLT 358  --   --   LABPROT  --   --  12.9  INR  --   --  0.97  HEPARINUNFRC  --   --  0.73*  CREATININE  --  1.00  --   TROPONINI  --   --  0.71*    Estimated Creatinine Clearance: 63.5 ml/min (by C-G formula based on Cr of 1).   Medications:  Heparin drip prior to cath at 650 units/hr.   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 CBC has been stable.   11/21/2013 16:55:51 Sheath(s) - Removed: TR Band applied per protocol; amount of air inserted: 13 cc - per End of Case Report.   TR band removed at 21:45PM.   Goal of Therapy:  Heparin level 0.3-0.7 units/ml Monitor platelets by anticoagulation protocol: Yes   Plan:  1. Restart heparin at 600 units/hr at  05:45 AM. No bolus.  2. Heparin level 6 hours after restart.  3. Continue daily heparin levels and CBC while on therapy.   Sloan Leiter, PharmD, BCPS Clinical Pharmacist [phone] 11/21/2013,5:47 PM

## 2013-11-21 NOTE — Progress Notes (Addendum)
ANTICOAGULATION CONSULT NOTE - Initial Consult  Pharmacy Consult for heparin  Indication: chest pain/ACS  Allergies  Allergen Reactions  . Codeine Hives and Nausea And Vomiting    Patient Measurements: Height: 5\' 7"  (170.2 cm) Weight: 120 lb (54.432 kg) IBW/kg (Calculated) : 66.1 Heparin Dosing Weight:   Vital Signs: Temp: 98.5 F (36.9 C) (07/16 0635) Temp src: Oral (07/16 0635) BP: 128/61 mmHg (07/16 0715) Pulse Rate: 96 (07/16 0715)  Labs:  Recent Labs  11/21/13 0313 11/21/13 0323  HGB 12.7* 13.9  HCT 38.5* 41.0  PLT 358  --   CREATININE  --  1.00    Estimated Creatinine Clearance: 63.5 ml/min (by C-G formula based on Cr of 1).   Medical History: Past Medical History  Diagnosis Date  . Thyroid disease     hyper, not on meds  . COPD (chronic obstructive pulmonary disease)   . Hodgkin's disease 1993    with abdominal surgical scar -pt unable to describe   . Spontaneous pneumothorax     left  . Spontaneous pneumothorax     right  . Pneumonia   . Chronic respiratory failure   . Anxiety state, unspecified   . Depressive disorder, not elsewhere classified   . Chronic airway obstruction, not elsewhere classified   . Unspecified constipation   . Lumbago   . Other malaise and fatigue   . Headache(784.0)   . Other dyspnea and respiratory abnormality   . Cough   . Diarrhea   . Weight loss     Medications:  Takes viagra 25mg  tablets --no anticoags listed. Assessment: Chest pain x2 days  Non-exertional. COPD on 3L 02. Cath for today per varnasi.  Goal of Therapy:  Heparin level 0.3-0.7 units/ml Monitor platelets by anticoagulation protocol: Yes   Plan:  Heparin 4000 units received in ED. Continue infusion at 700 units/hr. 6 hour HL and f/u post cath  Curlene Dolphin 11/21/2013,7:40 AM

## 2013-11-21 NOTE — H&P (Addendum)
[facility] History and Physical  Philip Coleman TKZ:[id number] DOB: 11-01-1955 DOA: 11/21/2013  Referring physician: Tamera Punt PCP: Delrae Rend, NP  Specialists:   Chief Complaint: Shortness of breath and chest pain  HPI: Philip Coleman is a 58 y.o. male  With a history of COPD, Hodgkin's disease, hyperlipidemia that presents emergency department with complaints of shortness of breath and chest pain. Patient states his shortness of breath started approximately 2-3 days ago, and had chest pain that started 2-3 days ago.  Patient complains of orthopnea, usually uses 2 pillows, currently now 4-5 pillows. Patient states his chest pain has been constant and is across his chest. He states it is a stabbing type of pain. The pain is nonexertional however does radiate to both of his arms. Patient denies any nausea or dizziness with this chest pain. He has had shortness of breath. Patient does use oxygen at home, 3 L. Patient does continue to smoke approximately one pack per day. In route, patient was given 3 sublingual nitroglycerin as well as aspirin by EMS. In the emergency department patient was given another 3 nitroglycerin tablets. Cardiology was called by the emergency department, patient was found to have elevated troponin, cardiology clinic for catheterization later today.  Review of Systems:  Constitutional: Denies fever, chills, diaphoresis, appetite change and fatigue.  HEENT: Denies photophobia, eye pain, redness, hearing loss, ear pain, congestion, sore throat, rhinorrhea, sneezing, mouth sores, trouble swallowing, neck pain, neck stiffness and tinnitus.   Respiratory:  complains of shortness of breath, cough. Cardiovascular: Complains of chest pain. Gastrointestinal: Denies nausea, vomiting, abdominal pain, diarrhea, constipation, blood in stool and abdominal distention.  Genitourinary: Denies dysuria, urgency, frequency, hematuria, flank pain and difficulty urinating.    Musculoskeletal: Denies myalgias, back pain, joint swelling, arthralgias and gait problem.  Skin: Denies pallor, rash and wound.  Neurological: Denies dizziness, seizures, syncope, weakness, light-headedness, numbness and headaches.  Hematological: Denies adenopathy. Easy bruising, personal or family bleeding history  Psychiatric/Behavioral: Denies suicidal ideation, mood changes, confusion, nervousness, sleep disturbance and agitation  Past Medical History  Diagnosis Date  . Thyroid disease     hyper, not on meds  . COPD (chronic obstructive pulmonary disease)   . Hodgkin's disease 1993    with abdominal surgical scar -pt unable to describe   . Spontaneous pneumothorax     left  . Spontaneous pneumothorax     right  . Pneumonia   . Chronic respiratory failure   . Anxiety state, unspecified   . Depressive disorder, not elsewhere classified   . Chronic airway obstruction, not elsewhere classified   . Unspecified constipation   . Lumbago   . Other malaise and fatigue   . Headache(784.0)   . Other dyspnea and respiratory abnormality   . Cough   . Diarrhea   . Weight loss    Past Surgical History  Procedure Laterality Date  . Splenectomy  1987  . Chest tube insertion      left  . Chest tube insertion      right  . Teeth extracted for dentures  2012   Social History:  reports that he has been smoking Cigarettes.  He has a 10.25 pack-year smoking history. He uses smokeless tobacco. He reports that he drinks about 3.6 ounces of alcohol per week. He reports that he does not use illicit drugs.   Allergies  Allergen Reactions  . Codeine Hives and Nausea And Vomiting    Family History  Problem Relation Age of Onset  .  Cancer Mother 54  . Pneumonia Father   . Cancer Sister   . Stroke Sister     Prior to Admission medications   Medication Sig Start Date End Date Taking? Authorizing Provider  albuterol (PROAIR HFA) 108 (90 BASE) MCG/ACT inhaler Inhale 2 puffs into the  lungs every 6 (six) hours as needed for wheezing or shortness of breath. 03/21/13  Yes Chesley Mires, MD  albuterol (PROVENTIL) (2.5 MG/3ML) 0.083% nebulizer solution Take 3 mLs (2.5 mg total) by nebulization every 6 (six) hours as needed for wheezing or shortness of breath. 05/23/13  Yes Chesley Mires, MD  atorvastatin (LIPITOR) 40 MG tablet Take 40 mg by mouth daily.   Yes Historical Provider, MD  budesonide-formoterol (SYMBICORT) 160-4.5 MCG/ACT inhaler Inhale 2 puffs into the lungs 2 (two) times daily.   Yes Historical Provider, MD  omeprazole (PRILOSEC) 20 MG capsule Take 20 mg by mouth daily.   Yes Historical Provider, MD  sildenafil (VIAGRA) 25 MG tablet Take 1 tablet (25 mg total) by mouth daily as needed for erectile dysfunction. 09/12/13  Yes Tiffany L Reed, DO  tiotropium (SPIRIVA) 18 MCG inhalation capsule Place 18 mcg into inhaler and inhale daily.   Yes Historical Provider, MD  traMADol (ULTRAM) 50 MG tablet Take 100 mg by mouth every 6 (six) hours as needed for moderate pain.   Yes Historical Provider, MD   Physical Exam: Filed Vitals:   11/21/13 0830  BP: 156/87  Pulse: 100  Temp:   Resp: 18     General: Well developed, thin, mild distress, appears stated age  HEENT: NCAT, PERRLA, EOMI, Anicteic Sclera, mucous membranes moist.   Neck: Supple, no JVD, no masses  Cardiovascular: S1 S2 auscultated, no rubs, murmurs or gallops. Regular rate and rhythm.  Respiratory: Clear to auscultation bilaterally with equal chest rise  Abdomen: Soft, nontender, nondistended, + bowel sounds  Extremities: warm dry without cyanosis clubbing or edema  Neuro: AAOx3, cranial nerves grossly intact. Strength equal and bilateral in upper/lower ext  Skin: Without rashes exudates or nodules  Psych: Normal affect and demeanor with intact judgement and insight  Labs on Admission:  Basic Metabolic Panel:  Recent Labs Lab 11/21/13 0323  NA 135*  K 4.3  CL 97  GLUCOSE 94  BUN 11  CREATININE  1.00   Liver Function Tests: No results found for this basename: AST, ALT, ALKPHOS, BILITOT, PROT, ALBUMIN,  in the last 168 hours No results found for this basename: LIPASE, AMYLASE,  in the last 168 hours No results found for this basename: AMMONIA,  in the last 168 hours CBC:  Recent Labs Lab 11/21/13 0313 11/21/13 0323  WBC 12.0*  --   NEUTROABS 5.1  --   HGB 12.7* 13.9  HCT 38.5* 41.0  MCV 86.7  --   PLT 358  --    Cardiac Enzymes: No results found for this basename: CKTOTAL, CKMB, CKMBINDEX, TROPONINI,  in the last 168 hours  BNP (last 3 results) No results found for this basename: PROBNP,  in the last 8760 hours CBG: No results found for this basename: GLUCAP,  in the last 168 hours  Radiological Exams on Admission: Dg Chest 2 View  11/21/2013   CLINICAL DATA:  Chest pain and shortness of breath.  EXAM: CHEST  2 VIEW  COMPARISON:  Chest radiograph Sep 24, 2012  FINDINGS: Cardiomediastinal silhouette is nonsuspicious, mildly calcified aortic knob. Left apical bullous changes, pleural thickening and scarring right lung apex. COPD with slightly increasing right  middle lobe airspace opacity. No pleural effusions. No pneumothorax.  Remote left clavicle fracture.  Surgical clips at the GE junction.  IMPRESSION: COPD and bullous changes with increasing strandy densities in right middle lobe which could reflect pneumonia though, recommend followup chest radiograph after treatment to verify improvement.   Electronically Signed   By: Elon Alas   On: 11/21/2013 03:52   Ct Angio Chest Pe W/cm &/or Wo Cm  11/21/2013   CLINICAL DATA:  Assess aorta. Chest pain, shortness of breath. History of VATS.  EXAM: CT ANGIOGRAPHY CHEST WITH CONTRAST  TECHNIQUE: Multidetector CT imaging of the chest was performed using the standard protocol during bolus administration of intravenous contrast. Multiplanar CT image reconstructions and MIPs were obtained to evaluate the vascular anatomy.  CONTRAST:   145mL OMNIPAQUE IOHEXOL 350 MG/ML SOLN  COMPARISON:  Chest radiograph November 21, 2013 and CT of the chest April 01, 2012  FINDINGS: Noncontrast imaging of aorta shows homogeneous density, mild to moderate calcific atherosclerosis. Ascending aorta is normal in course caliber. Moderate calcific atherosclerosis of the carotid bulb with eccentric intimal thickening propagating into the origin of the left subclavian artery, measuring up to 6 mm, narrowing the true lumen by less than 50%. Descending aorta is normal in course and caliber, mild luminal regularity data intimal thickening and calcific atherosclerosis. No dissection, aneurysm, contrast extravasation, periaortic fluid collections. Diffuse bronchial wall thickening. Though not tailored for evaluation, no central pulmonary arterial filling defects.  Severe bilateral apical bullous changes in a background of severe centrilobular emphysema. Large right lung base bulla with superimposed right lower lobe hypoenhancing consolidation, axial 62/93. No pleural effusions. Right apical pleural thickening and scarring. No pneumothorax.  Tracheobronchial tree is patent and midline. No lymphadenopathy by CT size criteria. Thoracic esophagus is unremarkable. Included view of the abdomen is nonsuspicious. 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  Stable 15 mm dominant thyroid lesion at the level of the isthmus. Mild degenerative change of the thoracic spine, scattered Schmorl's nodes.  Review of the MIP images confirms the above findings.  IMPRESSION: No acute vascular process, specifically no aortic dissection.  Peribronchial wall thickening which likely reflect bronchitis. Severe emphysema with similar right lung base presumed atelectasis and right apical pleural thickening/scarring.  Stable 15 mm dominant isthmus thyroid lesion for which follow-up ultrasound is recommended on a nonemergent basis.   Electronically Signed   By: Elon Alas   On:  11/21/2013 06:37    EKG: Independently reviewed. Sinus rhythm, ST depression in lateral leads  Assessment/Plan  Atypical Chest Pain/NSTEMI -Patient be admitted to step down unit -Troponin elevated 0.4 a -EKG shows some ST depressions in the lateral leads -Cardiology, Dr. Irish Lack, consulted by the emergency department -Will continue patient on heparin drip as well as nitroglycerin drip -Patient is planned for catheterization today -Pending further evaluation and recommendations from cardiology -Patient will remain n.p.o. -Will continue to cycle troponins, obtain magnesium, phosphate, TSH, lipid panel -Patient will need to be started on aspirin, beta blocker, ACEI after cath  Dyspnea secondary to community-acquired pneumonia versus COPD exacerbation -Chest x-ray shows COPD and bullous changes with increasing strandy densities in the right middle lobe which may reflect pneumonia -Will place patient on azithromycin and ceftriaxone -Will obtain urine strep pneumonia and Legionella antigens, sputum culture and Gram stain if possible -Will continue Kariva, Symbicort, and place patient on DuoNeb treatments -Will place patient on Mucinex and steroids  Hyperlipidemia -Will obtain lipid panel continue atorvastatin  COPD -Continue Spiriva, Symbicort -  Will place patient on DuoNeb treatments  Nicotine abuse -Patient counseled on smoking cessation  Malnutrition -Will consult nutrition for assessment.  DVT prophylaxis: Heparin drip  Code Status: Full  Condition: Guarded  Family Communication: None at bedside. Admission, patients condition and plan of care including tests being ordered have been discussed with the patient, who indicates understanding and agrees with the plan and Code Status.  Disposition Plan: Admit to stepdown.  Time spent: 60 minutes  Zaivion Kundrat D.O. [facility] Pager [phone]  If 7PM-7AM, please contact night-coverage [email or web address] Password  [facility] 11/21/2013, 10:29 AM

## 2013-11-22 ENCOUNTER — Encounter (HOSPITAL_COMMUNITY): Payer: Self-pay | Admitting: Interventional Cardiology

## 2013-11-22 ENCOUNTER — Encounter (HOSPITAL_COMMUNITY)
Admission: EM | Disposition: A | Discharge status: Home / Self Care | Payer: Self-pay | Source: Home / Self Care | Attending: Internal Medicine

## 2013-11-22 DIAGNOSIS — I200 Unstable angina: Secondary | ICD-10-CM

## 2013-11-22 DIAGNOSIS — J9610 Chronic respiratory failure, unspecified whether with hypoxia or hypercapnia: Secondary | ICD-10-CM

## 2013-11-22 DIAGNOSIS — I517 Cardiomegaly: Secondary | ICD-10-CM

## 2013-11-22 DIAGNOSIS — R0902 Hypoxemia: Secondary | ICD-10-CM

## 2013-11-22 DIAGNOSIS — I214 Non-ST elevation (NSTEMI) myocardial infarction: Secondary | ICD-10-CM

## 2013-11-22 DIAGNOSIS — I2510 Atherosclerotic heart disease of native coronary artery without angina pectoris: Secondary | ICD-10-CM

## 2013-11-22 DIAGNOSIS — J449 Chronic obstructive pulmonary disease, unspecified: Secondary | ICD-10-CM

## 2013-11-22 HISTORY — PX: PERCUTANEOUS CORONARY INTERVENTION-BALLOON ONLY: SHX6014

## 2013-11-22 HISTORY — PX: PERCUTANEOUS CORONARY STENT INTERVENTION (PCI-S): SHX5485

## 2013-11-22 LAB — CBC
HCT: 41.6 % (ref 39.0–52.0)
HEMOGLOBIN: 13.3 g/dL (ref 13.0–17.0)
MCH: 27.7 pg (ref 26.0–34.0)
MCHC: 32 g/dL (ref 30.0–36.0)
MCV: 86.7 fL (ref 78.0–100.0)
PLATELETS: 335 10*3/uL (ref 150–400)
RBC: 4.8 MIL/uL (ref 4.22–5.81)
RDW: 14.3 % (ref 11.5–15.5)
WBC: 16.1 10*3/uL — ABNORMAL HIGH (ref 4.0–10.5)

## 2013-11-22 LAB — COMPREHENSIVE METABOLIC PANEL
ALK PHOS: 122 U/L — AB (ref 39–117)
ALT: 20 U/L (ref 0–53)
ANION GAP: 12 (ref 5–15)
AST: 38 U/L — ABNORMAL HIGH (ref 0–37)
Albumin: 3.5 g/dL (ref 3.5–5.2)
BUN: 16 mg/dL (ref 6–23)
CO2: 34 mEq/L — ABNORMAL HIGH (ref 19–32)
CREATININE: 0.92 mg/dL (ref 0.50–1.35)
Calcium: 9.2 mg/dL (ref 8.4–10.5)
Chloride: 92 mEq/L — ABNORMAL LOW (ref 96–112)
GFR calc non Af Amer: >90 mL/min (ref >90)
GLUCOSE: 121 mg/dL — AB (ref 70–99)
POTASSIUM: 4.3 meq/L (ref 3.7–5.3)
Sodium: 138 mEq/L (ref 137–147)
TOTAL PROTEIN: 7.8 g/dL (ref 6.0–8.3)
Total Bilirubin: 0.3 mg/dL (ref 0.3–1.2)

## 2013-11-22 LAB — LEGIONELLA ANTIGEN, URINE: Legionella Antigen, Urine: NEGATIVE

## 2013-11-22 LAB — POCT ACTIVATED CLOTTING TIME
ACTIVATED CLOTTING TIME: 304 s
Activated Clotting Time: 326 seconds

## 2013-11-22 LAB — TROPONIN I: TROPONIN I: 2.05 ng/mL — AB (ref <0.30)

## 2013-11-22 LAB — HEPARIN LEVEL (UNFRACTIONATED): Heparin Unfractionated: 0.25 IU/mL — ABNORMAL LOW (ref 0.30–0.70)

## 2013-11-22 SURGERY — PERCUTANEOUS CORONARY STENT INTERVENTION (PCI-S)
Anesthesia: LOCAL

## 2013-11-22 MED ORDER — HEPARIN SODIUM (PORCINE) 1000 UNIT/ML IJ SOLN
INTRAMUSCULAR | Status: AC
  Filled 2013-11-22: qty 1

## 2013-11-22 MED ORDER — MIDAZOLAM HCL 2 MG/2ML IJ SOLN
INTRAMUSCULAR | Status: AC
  Filled 2013-11-22: qty 2

## 2013-11-22 MED ORDER — VERAPAMIL HCL 2.5 MG/ML IV SOLN
INTRAVENOUS | Status: AC
  Filled 2013-11-22: qty 2

## 2013-11-22 MED ORDER — ISOSORBIDE MONONITRATE ER 30 MG PO TB24
30.0000 mg | ORAL_TABLET | Freq: Every day | ORAL | Status: DC
  Administered 2013-11-22 – 2013-11-24 (×3): 30 mg via ORAL
  Filled 2013-11-22 (×4): qty 1

## 2013-11-22 MED ORDER — LIDOCAINE HCL (PF) 1 % IJ SOLN
INTRAMUSCULAR | Status: AC
  Filled 2013-11-22: qty 30

## 2013-11-22 MED ORDER — NITROGLYCERIN 1 MG/10 ML FOR IR/CATH LAB
INTRA_ARTERIAL | Status: AC
  Filled 2013-11-22: qty 10

## 2013-11-22 MED ORDER — HEPARIN (PORCINE) IN NACL 2-0.9 UNIT/ML-% IJ SOLN
INTRAMUSCULAR | Status: AC
  Filled 2013-11-22: qty 1000

## 2013-11-22 MED ORDER — ASPIRIN 81 MG PO CHEW: 81.0000 mg | CHEWABLE_TABLET | Freq: Every day | ORAL | Status: DC

## 2013-11-22 MED ORDER — ONDANSETRON HCL 4 MG/2ML IJ SOLN: 4.0000 mg | Freq: Four times a day (QID) | INTRAMUSCULAR | Status: DC | PRN

## 2013-11-22 MED ORDER — FENTANYL CITRATE 0.05 MG/ML IJ SOLN
INTRAMUSCULAR | Status: AC
  Filled 2013-11-22: qty 2

## 2013-11-22 MED ORDER — ACETAMINOPHEN 325 MG PO TABS
650.0000 mg | ORAL_TABLET | ORAL | Status: DC | PRN
Start: 2013-11-22 — End: 2013-11-22

## 2013-11-22 MED ORDER — SODIUM CHLORIDE 0.9 % IV SOLN: 1.0000 mL/kg/h | INTRAVENOUS | Status: AC

## 2013-11-22 MED ORDER — ATORVASTATIN CALCIUM 80 MG PO TABS
80.0000 mg | ORAL_TABLET | Freq: Every day | ORAL | Status: DC
  Administered 2013-11-23 – 2013-11-26 (×4): 80 mg via ORAL
  Filled 2013-11-22 (×5): qty 1

## 2013-11-22 MED ORDER — DIPHENHYDRAMINE HCL 25 MG PO CAPS
25.0000 mg | ORAL_CAPSULE | Freq: Once | ORAL | Status: AC
  Administered 2013-11-22: 25 mg via ORAL
  Filled 2013-11-22: qty 1

## 2013-11-22 MED ORDER — TICAGRELOR 90 MG PO TABS: 90.0000 mg | ORAL_TABLET | Freq: Two times a day (BID) | ORAL | Status: DC

## 2013-11-22 NOTE — Progress Notes (Signed)
ANTICOAGULATION CONSULT NOTE - Follow Up Consult  Pharmacy Consult for Heparin Indication: chest pain/ACS  Allergies  Allergen Reactions  . Codeine Hives and Nausea And Vomiting    Patient Measurements: Height: 5\' 7"  (170.2 cm) Weight: 107 lb 9.4 oz (48.8 kg) IBW/kg (Calculated) : 66.1 Heparin Dosing Weight: 54.4 kg  Vital Signs: Temp: 98.4 F (36.9 C) (07/17 1129) Temp src: Oral (07/17 1129) BP: 94/47 mmHg (07/17 1129) Pulse Rate: 65 (07/17 1129)  Labs:  Recent Labs  11/21/13 0313 11/21/13 0323 11/21/13 1305 11/21/13 1850 11/22/13 0052 11/22/13 1130  HGB 12.7* 13.9  --   --  13.3  --   HCT 38.5* 41.0  --   --  41.6  --   PLT 358  --   --   --  335  --   LABPROT  --   --  12.9  --   --   --   INR  --   --  0.97  --   --   --   HEPARINUNFRC  --   --  0.73*  --   --  0.25*  CREATININE  --  1.00  --   --  0.92  --   TROPONINI  --   --  0.71* 1.10* 2.05*  --     Estimated Creatinine Clearance: 61.9 ml/min (by C-G formula based on Cr of 0.92).   Medications:  Heparin drip at 600 units/hr.   Assessment: 38 YOM s/p cath found to have 2 vessel CAD who resumed heparin 8 hours post-sheath removal. 7/17 heparin level of 0.25 on 600 units/hr. CBC has been stable with no s/sx of bleeding.   Goal of Therapy:  Heparin level 0.3-0.7 units/ml Monitor platelets by anticoagulation protocol: Yes   Plan:  1. Increase heparin by 2 units/kg/hr to heparin gtt IV 700 units/hr. 2. Check heparin level in 6 hours. 3. Continue daily heparin levels and CBC while on therapy.   Herchel Hopkin E. Senan Urey, Pharm.D Clinical Pharmacy Resident Pager: [phone] 11/22/2013 1:27 PM

## 2013-11-22 NOTE — CV Procedure (Signed)
PROCEDURE:  PCI LAD, PCI Cirumflex  INDICATIONS:  NSTEMI  The risks, benefits, and details of the procedure were explained to the patient.  The patient verbalized understanding and wanted to proceed.  Informed written consent was obtained.  PROCEDURE TECHNIQUE:  After Xylocaine anesthesia a 24F slender sheath was placed in the right radial artery with a single anterior needle wall stick.   Right coronary angiography was done using a Judkins R4 guide catheter.  Left coronary angiography was done using a Judkins L3.5 guide catheter.  Left ventriculography was done using a pigtail catheter.  A TR band was used for hemostasis.   CONTRAST:  Total of 90 cc.  COMPLICATIONS:  None.    HEMODYNAMICS:  Aortic pressure was 133/80.    ANGIOGRAPHIC DATA:   The left main coronary artery is a large vessel with a 50% distal stenosis.  The left anterior descending artery is a large vessel with a 90% thrombotic mid vessel stenosis.  The left circumflex artery is medium size vessel with a 90 bend at the ostium. There is a 50% ostial lesion. In the mid circumflex, he has a 95% lesion.   PCI NARRATIVE:  IV heparin was given. An ACT was used to check that the heparin was therapeutic. A CLS 3.0 guiding catheters using his left main. A pro-water wire was placed across the area disease in the LAD. A 2.5 x 15 balloon was used to predilate the diseased segment in the LAD. A 3.0 x 64mm promus drug-eluting stent was deployed. A 3.5 x 20 noncompliant balloon was used to post dilate the stent. Intracoronary nitroglycerin was given. There was an excellent angiographic result.  Attention was then turned to the circumflex stenosis. A BMW wire was advanced to the ostium the circumflex and eventually crossed with some difficulty. It was difficult to get the wire to the distal vessel do to calcium more proximally. A 2.0 x 12 balloon  Was used to dilate the lesion. There was difficulty in advancing a 2.25 x 16  drug-eluting stent. We then put the pro-water wire as a buddy wire into the circumflex. The stent was again advanced but would not cross into the circumflex. Several attempts were made. The combination of the tortuosity and moderate disease in the distal left main and ostial circumflex did not allow the stent to pass. There is a significantly improved flow in the circumflex. We opted to stop at this point to avoid causing any trauma to the distal left main.  The wires were removed and the ostial circumflex and distal left main appeared as they did prior to the procedure.  IMPRESSIONS:  1. Moderate disease in the distal left main coronary artery. 2. Successful PCI of the mid left anterior descending artery with a 3.0 x 38 mm Promus drug-eluting stent, postdilated to 3.6 mm in diameter.. 3. Successful balloon angioplasty of the mid left circumflex artery with a 2.0 x 12 balloon. There is a residual 50% stenosis in the mid circumflex.  Due to the distal left main disease and ostial circumflex disease, we were unable to pass a stent to the mid circumflex.  We stopped after angioplasty to avoid causing any trauma to the distal left main.   RECOMMENDATION:  Continue dual antiplatelet therapy for at least a year. Continue antianginal therapy. I think the majority of his symptoms were coming from the LAD lesion. This was successfully treated with a stent. The circumflex lesion could not be stented.  Continue  aggressive medical therapy.  He will need a repeat echo in 4-6 weeks to see if his LV dysfunction has improved.  Anticipate d/c home in the next 1-2 days.  Needs medical therapy for his heart failure as well.

## 2013-11-22 NOTE — Progress Notes (Signed)
Patient Demographics  Philip Coleman, is a 58 y.o. male, DOB - 10-14-1955, ZLD:[id number]  Admit date - 11/21/2013   Admitting Physician Cristal Ford, DO  Outpatient Primary MD for the patient is Karam, Ardeth Sportsman, NP  LOS - 1   Chief Complaint  Patient presents with  . Chest Pain  . Shortness of Breath         Subjective:   Philip Coleman today has, No headache, No chest pain, No abdominal pain - No Nausea, No new weakness tingling or numbness, No Cough - SOB.      Assessment & Plan    1.NSTEMI with Unstable Angina- seen by cardiology underwent left heart cath with PCI to LAD, kindly see report below, currently on dual antiplatelet therapy along with beta blocker and statin for secondary prevention, now symptom-free, is stable and cleared by cardiology will discharge in the morning tomorrow.    2. History of chronic diastolic heart failure per echo gram and 2013 EF 50-55%. Mild acute on chronic decompensation, repeat echo pending, currently on beta blocker, Lasix, salt and fluid restriction, monitor clinically. Will stop nitro drip and put him on Imdur instead.Likely Ischemic component and [place] made symptoms worse.    3. COPD with mild exacerbation. Currently on azithromycin, No wheezing no SOB, Stop Rocephin along with steroids, continue nebulizer treatment as needed. Titrate oxygen to keep pulse ox 90%.       4. GERD. On PPI continue.    5. Dyslipidemia continue statin but at increased dose as LDL was not at goal.     6.Smoking Hx - counseled to quit.       Code Status: Full  Family Communication:    Disposition Plan: Home soon   Procedures   Echo pending    L Heart Cath 11-22-13 Dr Irish Lack  IMPRESSIONS:  1. Moderate disease in the distal left main  coronary artery. 2. Successful PCI of the mid left anterior descending artery with a 3.0 x 38 mm Promus drug-eluting stent, postdilated to 3.6 mm in diameter.. 3. Successful balloon angioplasty of the mid left circumflex artery with a 2.0 x 12 balloon. There is a residual 50% stenosis in the mid circumflex. Due to the distal left main disease and ostial circumflex disease, we were unable to pass a stent to the mid circumflex. We stopped after angioplasty to avoid causing any trauma to the distal left main. RECOMMENDATION: Continue dual antiplatelet therapy for at least a year. Continue antianginal therapy. I think the majority of his symptoms were coming from the LAD lesion. This was successfully treated with a stent. The circumflex lesion could not be stented. Continue aggressive medical therapy. He will need a repeat echo in 4-6 weeks to see if his LV dysfunction has improved. Anticipate d/c home in the next 1-2 days. Needs medical therapy for his heart failure as well.     Consults cards   Medications  Scheduled Meds: . aspirin  81 mg Oral Daily  . atorvastatin  40 mg Oral Daily  . azithromycin  500 mg Intravenous Q24H  . budesonide-formoterol  2 puff Inhalation BID  . cefTRIAXone (ROCEPHIN)  IV  1 g Intravenous Q24H  . folic acid  1 mg Oral Daily  . furosemide  40 mg Intravenous Q12H  . ipratropium-albuterol  3 mL Nebulization TID  . methylPREDNISolone (SOLU-MEDROL) injection  60 mg Intravenous Q12H  . metoprolol tartrate  25 mg Oral BID  . multivitamin with minerals  1 tablet Oral Daily  . pantoprazole  40 mg Oral Daily  . sodium chloride  3 mL Intravenous Q12H  . sodium chloride  3 mL Intravenous Q12H  . thiamine  100 mg Oral Daily  . ticagrelor  90 mg Oral BID  . tiotropium  18 mcg Inhalation Daily   Continuous Infusions: . sodium chloride 1 mL/kg/hr (11/22/13 0700)  . heparin 600 Units/hr (11/22/13 0700)  . nitroGLYCERIN 10 mcg/min (11/22/13 0800)   PRN Meds:.sodium  chloride, sodium chloride, acetaminophen, morphine injection, ondansetron (ZOFRAN) IV, sodium chloride, sodium chloride  DVT Prophylaxis    Heparin     Lab Results  Component Value Date   PLT 335 11/22/2013    Antibiotics     Anti-infectives   Start     Dose/Rate Route Frequency Ordered Stop   11/21/13 1300  cefTRIAXone (ROCEPHIN) 1 g in dextrose 5 % 50 mL IVPB     1 g 100 mL/hr over 30 Minutes Intravenous Every 24 hours 11/21/13 1210 11/28/13 1259   11/21/13 1300  azithromycin (ZITHROMAX) 500 mg in dextrose 5 % 250 mL IVPB     500 mg 250 mL/hr over 60 Minutes Intravenous Every 24 hours 11/21/13 1210 11/28/13 1259   11/21/13 0415  cefTRIAXone (ROCEPHIN) 1 g in dextrose 5 % 50 mL IVPB     1 g 100 mL/hr over 30 Minutes Intravenous  Once 11/21/13 0411 11/21/13 0511   11/21/13 0415  azithromycin (ZITHROMAX) tablet 500 mg     500 mg Oral  Once 11/21/13 0411 11/21/13 0425          Objective:   Filed Vitals:   11/22/13 0948 11/22/13 1000 11/22/13 1129 11/22/13 1240  BP:  101/69 94/47   Pulse:  74 65 67  Temp:   98.4 F (36.9 C)   TempSrc:   Oral   Resp:  15 16   Height:      Weight:      SpO2: 97% 95% 95%     Wt Readings from Last 3 Encounters:  11/22/13 48.8 kg (107 lb 9.4 oz)  11/22/13 48.8 kg (107 lb 9.4 oz)  11/22/13 48.8 kg (107 lb 9.4 oz)     Intake/Output Summary (Last 24 hours) at 11/22/13 1438 Last data filed at 11/22/13 1200  Gross per 24 hour  Intake 1281.55 ml  Output   2530 ml  Net -1248.45 ml     Physical Exam  Awake Alert, Oriented X 3, No new F.N deficits, Normal affect [place].AT,PERRAL Supple Neck,No JVD, No cervical lymphadenopathy appriciated.  Symmetrical Chest wall movement, Good air movement bilaterally, CTAB RRR,No Gallops,Rubs or new Murmurs, No Parasternal Heave +ve B.Sounds, Abd Soft, No tenderness, No organomegaly appriciated, No rebound - guarding or rigidity. No Cyanosis, Clubbing or edema, No new Rash or bruise      Data Review    Micro Results Recent Results (from the past 240 hour(s))  MRSA PCR SCREENING     Status: None   Collection Time    11/21/13 12:17 PM      Result Value Ref Range Status   MRSA by PCR NEGATIVE  NEGATIVE Final   Comment:            The GeneXpert MRSA Assay (FDA     approved  for NASAL specimens     only), is one component of a     comprehensive MRSA colonization     surveillance program. It is not     intended to diagnose MRSA     infection nor to guide or     monitor treatment for     MRSA infections.  CULTURE, BLOOD (ROUTINE X 2)     Status: None   Collection Time    11/21/13 12:53 PM      Result Value Ref Range Status   Specimen Description BLOOD RIGHT ANTECUBITAL   Final   Special Requests BOTTLES DRAWN AEROBIC AND ANAEROBIC 10CC   Final   Culture  Setup Time     Final   Value: 11/21/2013 17:36     Performed at [facility]   Culture     Final   Value:        BLOOD CULTURE RECEIVED NO GROWTH TO DATE CULTURE WILL BE HELD FOR 5 DAYS BEFORE ISSUING A FINAL NEGATIVE REPORT     Performed at [facility]   Report Status PENDING   Incomplete  CULTURE, BLOOD (ROUTINE X 2)     Status: None   Collection Time    11/21/13  1:05 PM      Result Value Ref Range Status   Specimen Description BLOOD RIGHT ANTECUBITAL   Final   Special Requests BOTTLES DRAWN AEROBIC ONLY 6CC   Final   Culture  Setup Time     Final   Value: 11/21/2013 17:36     Performed at [facility]   Culture     Final   Value:        BLOOD CULTURE RECEIVED NO GROWTH TO DATE CULTURE WILL BE HELD FOR 5 DAYS BEFORE ISSUING A FINAL NEGATIVE REPORT     Performed at [facility]   Report Status PENDING   Incomplete    Radiology Reports Dg Chest 2 View  11/21/2013   CLINICAL DATA:  Chest pain and shortness of breath.  EXAM: CHEST  2 VIEW  COMPARISON:  Chest radiograph Sep 24, 2012  FINDINGS: Cardiomediastinal silhouette is nonsuspicious, mildly calcified aortic knob. Left apical bullous  changes, pleural thickening and scarring right lung apex. COPD with slightly increasing right middle lobe airspace opacity. No pleural effusions. No pneumothorax.  Remote left clavicle fracture.  Surgical clips at the GE junction.  IMPRESSION: COPD and bullous changes with increasing strandy densities in right middle lobe which could reflect pneumonia though, recommend followup chest radiograph after treatment to verify improvement.   Electronically Signed   By: Elon Alas   On: 11/21/2013 03:52   Ct Angio Chest Pe W/cm &/or Wo Cm  11/21/2013   CLINICAL DATA:  Assess aorta. Chest pain, shortness of breath. History of VATS.  EXAM: CT ANGIOGRAPHY CHEST WITH CONTRAST  TECHNIQUE: Multidetector CT imaging of the chest was performed using the standard protocol during bolus administration of intravenous contrast. Multiplanar CT image reconstructions and MIPs were obtained to evaluate the vascular anatomy.  CONTRAST:  132mL OMNIPAQUE IOHEXOL 350 MG/ML SOLN  COMPARISON:  Chest radiograph November 21, 2013 and CT of the chest April 01, 2012  FINDINGS: Noncontrast imaging of aorta shows homogeneous density, mild to moderate calcific atherosclerosis. Ascending aorta is normal in course caliber. Moderate calcific atherosclerosis of the carotid bulb with eccentric intimal thickening propagating into the origin of the left subclavian artery, measuring up to 6 mm, narrowing the true lumen by less  than 50%. Descending aorta is normal in course and caliber, mild luminal regularity data intimal thickening and calcific atherosclerosis. No dissection, aneurysm, contrast extravasation, periaortic fluid collections. Diffuse bronchial wall thickening. Though not tailored for evaluation, no central pulmonary arterial filling defects.  Severe bilateral apical bullous changes in a background of severe centrilobular emphysema. Large right lung base bulla with superimposed right lower lobe hypoenhancing consolidation, axial 62/93. No  pleural effusions. Right apical pleural thickening and scarring. No pneumothorax.  Tracheobronchial tree is patent and midline. No lymphadenopathy by CT size criteria. Thoracic esophagus is unremarkable. Included view of the abdomen is nonsuspicious. 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  Stable 15 mm dominant thyroid lesion at the level of the isthmus. Mild degenerative change of the thoracic spine, scattered Schmorl's nodes.  Review of the MIP images confirms the above findings.  IMPRESSION: No acute vascular process, specifically no aortic dissection.  Peribronchial wall thickening which likely reflect bronchitis. Severe emphysema with similar right lung base presumed atelectasis and right apical pleural thickening/scarring.  Stable 15 mm dominant isthmus thyroid lesion for which follow-up ultrasound is recommended on a nonemergent basis.   Electronically Signed   By: Elon Alas   On: 11/21/2013 06:37    CBC  Recent Labs Lab 11/21/13 0313 11/21/13 0323 11/22/13 0052  WBC 12.0*  --  16.1*  HGB 12.7* 13.9 13.3  HCT 38.5* 41.0 41.6  PLT 358  --  335  MCV 86.7  --  86.7  MCH 28.6  --  27.7  MCHC 33.0  --  32.0  RDW 14.1  --  14.3  LYMPHSABS 5.0*  --   --   MONOABS 1.3*  --   --   EOSABS 0.5  --   --   BASOSABS 0.1  --   --     Chemistries   Recent Labs Lab 11/21/13 0323 11/21/13 1305 11/22/13 0052  NA 135*  --  138  K 4.3  --  4.3  CL 97  --  92*  CO2  --   --  34*  GLUCOSE 94  --  121*  BUN 11  --  16  CREATININE 1.00  --  0.92  CALCIUM  --   --  9.2  MG  --  2.0  --   AST  --   --  38*  ALT  --   --  20  ALKPHOS  --   --  122*  BILITOT  --   --  0.3   ------------------------------------------------------------------------------------------------------------------ estimated creatinine clearance is 61.9 ml/min (by C-G formula based on Cr of  0.92). ------------------------------------------------------------------------------------------------------------------ No results found for this basename: HGBA1C,  in the last 72 hours ------------------------------------------------------------------------------------------------------------------  Recent Labs  11/21/13 1305  CHOL 230*  HDL 62  LDLCALC 159*  TRIG 43  CHOLHDL 3.7   ------------------------------------------------------------------------------------------------------------------  Recent Labs  11/21/13 1305  TSH 0.747   ------------------------------------------------------------------------------------------------------------------ No results found for this basename: VITAMINB12, FOLATE, FERRITIN, TIBC, IRON, RETICCTPCT,  in the last 72 hours  Coagulation profile  Recent Labs Lab 11/21/13 1305  INR 0.97     Recent Labs  11/21/13 0419  DDIMER 0.28    Cardiac Enzymes  Recent Labs Lab 11/21/13 1305 11/21/13 1850 11/22/13 0052  TROPONINI 0.71* 1.10* 2.05*   ------------------------------------------------------------------------------------------------------------------ No components found with this basename: POCBNP,      Time Spent in minutes  35   SINGH,PRASHANT K M.D on 11/22/2013 at 2:38 PM  Between 7am to 7pm -  Pager - [phone]  After 7pm go to [email or web address] - password [email or web address]  And look for the night coverage person covering for me after hours  [facility] Office  [phone]   **Disclaimer: This note may have been dictated with voice recognition software. Similar sounding words can inadvertently be transcribed and this note may contain transcription errors which may not have been corrected upon publication of note.**

## 2013-11-22 NOTE — Progress Notes (Signed)
Patient underwent diagnostic cardiac cath yesterday with Dr. Martinique. Due to his multiple medical issues, Dr. Martinique felt he was not a candidate for cardiac surgery. He set patient up for PCI today as the patient is having trouble lying flat yesterday. The patient has been diuresed. He understands that open heart surgery would be higher risk because of his severe lung disease and overall frailty. We discussed the procedure and he is willing to proceed. He is willing to take dual antiplatelet therapy for at least a year.  Patient was loaded with Brilinta last evening.

## 2013-11-22 NOTE — Progress Notes (Signed)
TR BAND REMOVAL  LOCATION:    right radial  DEFLATED PER PROTOCOL:    Yes.    TIME BAND OFF / DRESSING APPLIED:    21:45   SITE UPON ARRIVAL:    Level 1  SITE AFTER BAND REMOVAL:    Level 1  REVERSE ALLEN'S TEST:     positive  CIRCULATION SENSATION AND MOVEMENT:    Within Normal Limits   Yes.    COMMENTS:  Bruise

## 2013-11-22 NOTE — Progress Notes (Signed)
Utilization review completed. Danicka Hourihan, RN, BSN. 

## 2013-11-22 NOTE — Interval H&P Note (Signed)
Cath Lab Visit (complete for each Cath Lab visit)  Clinical Evaluation Leading to the Procedure:   ACS: Yes.    Non-ACS:    Anginal Classification: CCS IV  Anti-ischemic medical therapy: Maximal Therapy (2 or more classes of medications)  Non-Invasive Test Results: No non-invasive testing performed  Prior CABG: No previous CABG      History and Physical Interval Note:  11/22/2013 12:42 PM  Philip Coleman  has presented today for surgery, with the diagnosis of cad  The various methods of treatment have been discussed with the patient and family. After consideration of risks, benefits and other options for treatment, the patient has consented to  Procedure(s): PERCUTANEOUS CORONARY STENT INTERVENTION (PCI-S) (N/A) as a surgical intervention .  The patient's history has been reviewed, patient examined, no change in status, stable for surgery.  I have reviewed the patient's chart and labs.  Questions were answered to the patient's satisfaction.     Philip Coleman S.

## 2013-11-22 NOTE — Progress Notes (Signed)
  Echocardiogram 2D Echocardiogram has been performed.  Darlina Sicilian M 11/22/2013, 3:56 PM

## 2013-11-22 NOTE — Progress Notes (Signed)
TR band removed, 2x2 and tegaderm dressing applied. No bleeding noted. Continue to monitor.

## 2013-11-22 NOTE — Progress Notes (Signed)
Cardiologist made aware about the chestpain, been asking for pain med. No order given and claimed that PCI  will be done late today .

## 2013-11-22 NOTE — Progress Notes (Signed)
Patient ID: Philip Coleman, male   DOB: Mar 01, 1956, 58 y.o.   MRN: [id number]    Subjective:  Denies SSCP, palpitations or Dyspnea   Objective:  Filed Vitals:   11/22/13 0748 11/22/13 0802 11/22/13 0948 11/22/13 1000  BP: 83/50 101/61  101/69  Pulse: 85   74  Temp: 98.4 F (36.9 C)     TempSrc: Oral     Resp: 14   15  Height:      Weight:      SpO2: 97%  97% 95%    Intake/Output from previous day:  Intake/Output Summary (Last 24 hours) at 11/22/13 1039 Last data filed at 11/22/13 1000  Gross per 24 hour  Intake 1160.75 ml  Output   3480 ml  Net -2319.25 ml    Physical Exam: Affect appropriate Thin chronically ill white male  HEENT: normal Neck supple with no adenopathy JVP normal no bruits no thyromegaly Lungs clear with no wheezing and good diaphragmatic motion Heart:  S1/S2 no murmur, no rub, gallop or click PMI normal Abdomen: benighn, BS positve, no tenderness, no AAA no bruit.  No HSM or HJR Distal pulses intact with no bruits No edema Neuro non-focal Skin warm and dry No muscular weakness Right radial site mild bruising  Good pulse    Lab Results: Basic Metabolic Panel:  Recent Labs  11/21/13 0323 11/21/13 1305 11/22/13 0052  NA 135*  --  138  K 4.3  --  4.3  CL 97  --  92*  CO2  --   --  34*  GLUCOSE 94  --  121*  BUN 11  --  16  CREATININE 1.00  --  0.92  CALCIUM  --   --  9.2  MG  --  2.0  --   PHOS  --  2.8  --    Liver Function Tests:  Recent Labs  11/22/13 0052  AST 38*  ALT 20  ALKPHOS 122*  BILITOT 0.3  PROT 7.8  ALBUMIN 3.5   CBC:  Recent Labs  11/21/13 0313 11/21/13 0323 11/22/13 0052  WBC 12.0*  --  16.1*  NEUTROABS 5.1  --   --   HGB 12.7* 13.9 13.3  HCT 38.5* 41.0 41.6  MCV 86.7  --  86.7  PLT 358  --  335   Cardiac Enzymes:  Recent Labs  11/21/13 1305 11/21/13 1850 11/22/13 0052  TROPONINI 0.71* 1.10* 2.05*   D-Dimer:  Recent Labs  11/21/13 0419  DDIMER 0.28   Fasting Lipid  Panel:  Recent Labs  11/21/13 1305  CHOL 230*  HDL 62  LDLCALC 159*  TRIG 43  CHOLHDL 3.7   Thyroid Function Tests:  Recent Labs  11/21/13 1305  TSH 0.747    Imaging: Dg Chest 2 View  11/21/2013   CLINICAL DATA:  Chest pain and shortness of breath.  EXAM: CHEST  2 VIEW  COMPARISON:  Chest radiograph Sep 24, 2012  FINDINGS: Cardiomediastinal silhouette is nonsuspicious, mildly calcified aortic knob. Left apical bullous changes, pleural thickening and scarring right lung apex. COPD with slightly increasing right middle lobe airspace opacity. No pleural effusions. No pneumothorax.  Remote left clavicle fracture.  Surgical clips at the GE junction.  IMPRESSION: COPD and bullous changes with increasing strandy densities in right middle lobe which could reflect pneumonia though, recommend followup chest radiograph after treatment to verify improvement.   Electronically Signed   By: Elon Alas   On: 11/21/2013 03:52   Ct Angio  Chest Pe W/cm &/or Wo Cm  11/21/2013   CLINICAL DATA:  Assess aorta. Chest pain, shortness of breath. History of VATS.  EXAM: CT ANGIOGRAPHY CHEST WITH CONTRAST  TECHNIQUE: Multidetector CT imaging of the chest was performed using the standard protocol during bolus administration of intravenous contrast. Multiplanar CT image reconstructions and MIPs were obtained to evaluate the vascular anatomy.  CONTRAST:  127mL OMNIPAQUE IOHEXOL 350 MG/ML SOLN  COMPARISON:  Chest radiograph November 21, 2013 and CT of the chest April 01, 2012  FINDINGS: Noncontrast imaging of aorta shows homogeneous density, mild to moderate calcific atherosclerosis. Ascending aorta is normal in course caliber. Moderate calcific atherosclerosis of the carotid bulb with eccentric intimal thickening propagating into the origin of the left subclavian artery, measuring up to 6 mm, narrowing the true lumen by less than 50%. Descending aorta is normal in course and caliber, mild luminal regularity data  intimal thickening and calcific atherosclerosis. No dissection, aneurysm, contrast extravasation, periaortic fluid collections. Diffuse bronchial wall thickening. Though not tailored for evaluation, no central pulmonary arterial filling defects.  Severe bilateral apical bullous changes in a background of severe centrilobular emphysema. Large right lung base bulla with superimposed right lower lobe hypoenhancing consolidation, axial 62/93. No pleural effusions. Right apical pleural thickening and scarring. No pneumothorax.  Tracheobronchial tree is patent and midline. No lymphadenopathy by CT size criteria. Thoracic esophagus is unremarkable. Included view of the abdomen is nonsuspicious. 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  Stable 15 mm dominant thyroid lesion at the level of the isthmus. Mild degenerative change of the thoracic spine, scattered Schmorl's nodes.  Review of the MIP images confirms the above findings.  IMPRESSION: No acute vascular process, specifically no aortic dissection.  Peribronchial wall thickening which likely reflect bronchitis. Severe emphysema with similar right lung base presumed atelectasis and right apical pleural thickening/scarring.  Stable 15 mm dominant isthmus thyroid lesion for which follow-up ultrasound is recommended on a nonemergent basis.   Electronically Signed   By: Elon Alas   On: 11/21/2013 06:37    Cardiac Studies:  ECG:  SR LVH lateral ST depression    Telemetry:  NSR no VT   Echo:  2013  EF 55%   Medications:   . aspirin  81 mg Oral Daily  . atorvastatin  40 mg Oral Daily  . azithromycin  500 mg Intravenous Q24H  . budesonide-formoterol  2 puff Inhalation BID  . cefTRIAXone (ROCEPHIN)  IV  1 g Intravenous Q24H  . folic acid  1 mg Oral Daily  . furosemide  40 mg Intravenous Q12H  . ipratropium-albuterol  3 mL Nebulization TID  . methylPREDNISolone (SOLU-MEDROL) injection  60 mg Intravenous Q12H  . metoprolol tartrate  25 mg  Oral BID  . multivitamin with minerals  1 tablet Oral Daily  . pantoprazole  40 mg Oral Daily  . sodium chloride  3 mL Intravenous Q12H  . sodium chloride  3 mL Intravenous Q12H  . thiamine  100 mg Oral Daily  . ticagrelor  90 mg Oral BID  . tiotropium  18 mcg Inhalation Daily     . sodium chloride 1 mL/kg/hr (11/22/13 0700)  . heparin 600 Units/hr (11/22/13 0700)  . nitroGLYCERIN 10 mcg/min (11/22/13 0800)    Assessment/Plan:  Chol:  Continue statin target LDL less than 70 COPD:  Stable continue spiriva and symbicort no beta agonist  ? Does he need to be on azithromycin and rocephin CAD: Discussed with Dr Martinique for PCI today  deemed not CABG candidate with comorbidities  Cr and lytes ok this am    Jenkins Rouge 11/22/2013, 10:39 AM

## 2013-11-22 NOTE — Progress Notes (Signed)
Back from the cath lab. By bed, awake and alert, TR band to right radial , elevated with pillow and pulse ox to right thumb applied. Denied any discomfort.

## 2013-11-22 NOTE — Progress Notes (Signed)
To the cath lab by bed, stable. 

## 2013-11-23 DIAGNOSIS — I509 Heart failure, unspecified: Secondary | ICD-10-CM

## 2013-11-23 DIAGNOSIS — I5023 Acute on chronic systolic (congestive) heart failure: Secondary | ICD-10-CM

## 2013-11-23 DIAGNOSIS — J438 Other emphysema: Secondary | ICD-10-CM

## 2013-11-23 LAB — CBC
HCT: 37.9 % — ABNORMAL LOW (ref 39.0–52.0)
HEMOGLOBIN: 12.2 g/dL — AB (ref 13.0–17.0)
MCH: 27.5 pg (ref 26.0–34.0)
MCHC: 32.2 g/dL (ref 30.0–36.0)
MCV: 85.6 fL (ref 78.0–100.0)
Platelets: 352 10*3/uL (ref 150–400)
RBC: 4.43 MIL/uL (ref 4.22–5.81)
RDW: 14.4 % (ref 11.5–15.5)
WBC: 21.2 10*3/uL — ABNORMAL HIGH (ref 4.0–10.5)

## 2013-11-23 LAB — BASIC METABOLIC PANEL
Anion gap: 12 (ref 5–15)
BUN: 25 mg/dL — ABNORMAL HIGH (ref 6–23)
CO2: 31 mEq/L (ref 19–32)
Calcium: 8.9 mg/dL (ref 8.4–10.5)
Chloride: 94 mEq/L — ABNORMAL LOW (ref 96–112)
Creatinine, Ser: 0.75 mg/dL (ref 0.50–1.35)
Glucose, Bld: 114 mg/dL — ABNORMAL HIGH (ref 70–99)
Potassium: 3.9 mEq/L (ref 3.7–5.3)
SODIUM: 137 meq/L (ref 137–147)

## 2013-11-23 MED ORDER — AZITHROMYCIN 500 MG PO TABS
500.0000 mg | ORAL_TABLET | Freq: Every day | ORAL | Status: DC
  Administered 2013-11-24 – 2013-11-26 (×3): 500 mg via ORAL
  Filled 2013-11-23 (×3): qty 1

## 2013-11-23 NOTE — Progress Notes (Signed)
Pt arrived to the unit, oriented to room question answered.

## 2013-11-23 NOTE — Care Management Note (Signed)
    Page 1 of 1   11/25/2013     1:41:10 PM CARE MANAGEMENT NOTE 11/25/2013  Patient:  Philip Coleman, Philip Coleman   Account Number:  [id number]  Date Initiated:  11/23/2013  Documentation initiated by:  [facility]  Subjective/Objective Assessment:   adm:   Chest Pain    Shortness of Breath     Action/Plan:   discharge planning   Anticipated DC Date:  11/24/2013   Anticipated DC Plan:  [place]  CM consult  Medication Assistance      Choice offered to / List presented to:             Status of service:  In process, will continue to follow Medicare Important Message given?  YES (If response is "NO", the following Medicare IM given date fields will be blank) Date Medicare IM given:  11/25/2013 Medicare IM given by:  [facility] Date Additional Medicare IM given:   Additional Medicare IM given by:    Discharge Disposition:  HOME/SELF CARE  Per UR Regulation:    If discussed at Long Length of Stay Meetings, dates discussed:   11/26/2013    Comments:  11/23/13 13:10 CM met with pt in room and activated his Brilinta 30-day free trial card.  CM faxed the activated card to pt's pharmacy, [place] fax:[phone].  Pharmacy phone number is [phone]. Pt either walks or uses his moped to get around and has an arrangement with [employer] to deliver his medication to his house.  PLEASE FAX BRILINTA (and any other) PRESCRIPTIONS TO [phone] AT DISCHARGE. [facility]' Pharmacist has been contacted and is aware Brilinta prescription will be faxed/wired at pt discharge.  Pt will need transportation home at discharge and CSW has been made aware.  No other CM needs were communicated at this time. CM will continue to follow for discharge needs.  Mariane Masters, BSN, CM [phone].

## 2013-11-23 NOTE — Progress Notes (Signed)
Patient Demographics  Philip Coleman, is a 58 y.o. male, DOB - 06-19-1955, DQQ:[id number]  Admit date - 11/21/2013   Admitting Physician Philip Ford, DO  Outpatient Primary MD for the patient is Philip, Ardeth Sportsman, NP  LOS - 2   Chief Complaint  Patient presents with  . Chest Pain  . Shortness of Breath         Subjective:   Philip Coleman has no complaints today- has a cough which he states is not unusual for him. States that he will try to stop smoking.     Assessment & Plan    1.NSTEMI with Unstable Angina- seen by cardiology underwent left heart cath with PCI to LAD, kindly see report below, currently on dual antiplatelet therapy along with beta blocker and statin for secondary prevention, now symptom free    2. History of chronic diastolic heart failure per echo gram and 2013 EF 50-55%. Mild acute on chronic decompensation, repeat echo pending, currently on beta blocker, Lasix, salt and fluid restriction, monitor clinically. Likely Ischemic component and [place] made symptoms worse.    3. COPD with mild exacerbation. Currently on azithromycin- switch to oral.   No wheezing no SOB, Stop Rocephin along with steroids, continue nebulizer treatment as needed. Titrate oxygen to keep pulse ox 90%.       4. GERD. On PPI continue.    5. Dyslipidemia continue statin but at increased dose as LDL was not at goal.     6.Smoking Hx - counseled to quit.       Code Status: Full  Disposition Plan: Home soon   Procedures   Echo pending    L Heart Cath 11-22-13 Dr Irish Lack  IMPRESSIONS:  1. Moderate disease in the distal left main coronary artery. 2. Successful PCI of the mid left anterior descending artery with a 3.0 x 38 mm Promus drug-eluting stent, postdilated to 3.6 mm  in diameter.. 3. Successful balloon angioplasty of the mid left circumflex artery with a 2.0 x 12 balloon. There is a residual 50% stenosis in the mid circumflex. Due to the distal left main disease and ostial circumflex disease, we were unable to pass a stent to the mid circumflex. We stopped after angioplasty to avoid causing any trauma to the distal left main. RECOMMENDATION: Continue dual antiplatelet therapy for at least a year. Continue antianginal therapy. I think the majority of his symptoms were coming from the LAD lesion. This was successfully treated with a stent. The circumflex lesion could not be stented. Continue aggressive medical therapy. He will need a repeat echo in 4-6 weeks to see if his LV dysfunction has improved. Anticipate d/c home in the next 1-2 days. Needs medical therapy for his heart failure as well.     Consults cards   Medications  Scheduled Meds: . aspirin  81 mg Oral Daily  . atorvastatin  80 mg Oral Daily  . azithromycin  500 mg Intravenous Q24H  . budesonide-formoterol  2 puff Inhalation BID  . folic acid  1 mg Oral Daily  . furosemide  40 mg Intravenous Q12H  . ipratropium-albuterol  3 mL Nebulization TID  . isosorbide mononitrate  30 mg Oral Daily  . metoprolol tartrate  25 mg Oral BID  .  multivitamin with minerals  1 tablet Oral Daily  . pantoprazole  40 mg Oral Daily  . sodium chloride  3 mL Intravenous Q12H  . thiamine  100 mg Oral Daily  . ticagrelor  90 mg Oral BID  . tiotropium  18 mcg Inhalation Daily   Continuous Infusions:   PRN Meds:.sodium chloride, acetaminophen, morphine injection, ondansetron (ZOFRAN) IV, sodium chloride  DVT Prophylaxis    Heparin     Lab Results  Component Value Date   PLT 352 11/23/2013    Antibiotics     Anti-infectives   Start     Dose/Rate Route Frequency Ordered Stop   11/21/13 1300  cefTRIAXone (ROCEPHIN) 1 g in dextrose 5 % 50 mL IVPB  Status:  Discontinued     1 g 100 mL/hr over 30 Minutes  Intravenous Every 24 hours 11/21/13 1210 11/22/13 1513   11/21/13 1300  azithromycin (ZITHROMAX) 500 mg in dextrose 5 % 250 mL IVPB     500 mg 250 mL/hr over 60 Minutes Intravenous Every 24 hours 11/21/13 1210 11/28/13 1259   11/21/13 0415  cefTRIAXone (ROCEPHIN) 1 g in dextrose 5 % 50 mL IVPB     1 g 100 mL/hr over 30 Minutes Intravenous  Once 11/21/13 0411 11/21/13 0511   11/21/13 0415  azithromycin (ZITHROMAX) tablet 500 mg     500 mg Oral  Once 11/21/13 0411 11/21/13 0425          Objective:   Filed Vitals:   11/23/13 0800 11/23/13 0827 11/23/13 1325 11/23/13 1500  BP: 105/67   100/59  Pulse: 69   74  Temp:    97 F (36.1 C)  TempSrc:    Oral  Resp: 17   15  Height:      Weight:      SpO2: 98% 96% 98% 95%    Wt Readings from Last 3 Encounters:  11/23/13 49.2 kg (108 lb 7.5 oz)  11/23/13 49.2 kg (108 lb 7.5 oz)  11/23/13 49.2 kg (108 lb 7.5 oz)     Intake/Output Summary (Last 24 hours) at 11/23/13 1632 Last data filed at 11/23/13 1500  Gross per 24 hour  Intake 1938.4 ml  Output   2150 ml  Net -211.6 ml     Physical Exam  Awake Alert, Oriented X 3, No new F.N deficits, Normal affect [place].AT,PERRAL Supple Neck,No JVD, No cervical lymphadenopathy appriciated.  Symmetrical Chest wall movement, Good air movement bilaterally, CTAB RRR,No Gallops,Rubs or new Murmurs, No Parasternal Heave +ve B.Sounds, Abd Soft, No tenderness, No organomegaly appriciated, No rebound - guarding or rigidity. No Cyanosis, Clubbing or edema, No new Rash or bruise      Data Review   Micro Results Recent Results (from the past 240 hour(s))  MRSA PCR SCREENING     Status: None   Collection Time    11/21/13 12:17 PM      Result Value Ref Range Status   MRSA by PCR NEGATIVE  NEGATIVE Final   Comment:            The GeneXpert MRSA Assay (FDA     approved for NASAL specimens     only), is one component of a     comprehensive MRSA colonization     surveillance program. It is not       intended to diagnose MRSA     infection nor to guide or     monitor treatment for     MRSA infections.  CULTURE, BLOOD (  ROUTINE X 2)     Status: None   Collection Time    11/21/13 12:53 PM      Result Value Ref Range Status   Specimen Description BLOOD RIGHT ANTECUBITAL   Final   Special Requests BOTTLES DRAWN AEROBIC AND ANAEROBIC 10CC   Final   Culture  Setup Time     Final   Value: 11/21/2013 17:36     Performed at [facility]   Culture     Final   Value:        BLOOD CULTURE RECEIVED NO GROWTH TO DATE CULTURE WILL BE HELD FOR 5 DAYS BEFORE ISSUING A FINAL NEGATIVE REPORT     Performed at [facility]   Report Status PENDING   Incomplete  CULTURE, BLOOD (ROUTINE X 2)     Status: None   Collection Time    11/21/13  1:05 PM      Result Value Ref Range Status   Specimen Description BLOOD RIGHT ANTECUBITAL   Final   Special Requests BOTTLES DRAWN AEROBIC ONLY 6CC   Final   Culture  Setup Time     Final   Value: 11/21/2013 17:36     Performed at [facility]   Culture     Final   Value:        BLOOD CULTURE RECEIVED NO GROWTH TO DATE CULTURE WILL BE HELD FOR 5 DAYS BEFORE ISSUING A FINAL NEGATIVE REPORT     Performed at [facility]   Report Status PENDING   Incomplete    Radiology Reports Dg Chest 2 View  11/21/2013   CLINICAL DATA:  Chest pain and shortness of breath.  EXAM: CHEST  2 VIEW  COMPARISON:  Chest radiograph Sep 24, 2012  FINDINGS: Cardiomediastinal silhouette is nonsuspicious, mildly calcified aortic knob. Left apical bullous changes, pleural thickening and scarring right lung apex. COPD with slightly increasing right middle lobe airspace opacity. No pleural effusions. No pneumothorax.  Remote left clavicle fracture.  Surgical clips at the GE junction.  IMPRESSION: COPD and bullous changes with increasing strandy densities in right middle lobe which could reflect pneumonia though, recommend followup chest radiograph after treatment  to verify improvement.   Electronically Signed   By: Elon Alas   On: 11/21/2013 03:52   Ct Angio Chest Pe W/cm &/or Wo Cm  11/21/2013   CLINICAL DATA:  Assess aorta. Chest pain, shortness of breath. History of VATS.  EXAM: CT ANGIOGRAPHY CHEST WITH CONTRAST  TECHNIQUE: Multidetector CT imaging of the chest was performed using the standard protocol during bolus administration of intravenous contrast. Multiplanar CT image reconstructions and MIPs were obtained to evaluate the vascular anatomy.  CONTRAST:  13mL OMNIPAQUE IOHEXOL 350 MG/ML SOLN  COMPARISON:  Chest radiograph November 21, 2013 and CT of the chest April 01, 2012  FINDINGS: Noncontrast imaging of aorta shows homogeneous density, mild to moderate calcific atherosclerosis. Ascending aorta is normal in course caliber. Moderate calcific atherosclerosis of the carotid bulb with eccentric intimal thickening propagating into the origin of the left subclavian artery, measuring up to 6 mm, narrowing the true lumen by less than 50%. Descending aorta is normal in course and caliber, mild luminal regularity data intimal thickening and calcific atherosclerosis. No dissection, aneurysm, contrast extravasation, periaortic fluid collections. Diffuse bronchial wall thickening. Though not tailored for evaluation, no central pulmonary arterial filling defects.  Severe bilateral apical bullous changes in a background of severe centrilobular emphysema. Large right lung base bulla with superimposed  right lower lobe hypoenhancing consolidation, axial 62/93. No pleural effusions. Right apical pleural thickening and scarring. No pneumothorax.  Tracheobronchial tree is patent and midline. No lymphadenopathy by CT size criteria. Thoracic esophagus is unremarkable. Included view of the abdomen is nonsuspicious. 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  Stable 15 mm dominant thyroid lesion at the level of the isthmus. Mild degenerative change of the thoracic  spine, scattered Schmorl's nodes.  Review of the MIP images confirms the above findings.  IMPRESSION: No acute vascular process, specifically no aortic dissection.  Peribronchial wall thickening which likely reflect bronchitis. Severe emphysema with similar right lung base presumed atelectasis and right apical pleural thickening/scarring.  Stable 15 mm dominant isthmus thyroid lesion for which follow-up ultrasound is recommended on a nonemergent basis.   Electronically Signed   By: Elon Alas   On: 11/21/2013 06:37    CBC  Recent Labs Lab 11/21/13 0313 11/21/13 0323 11/22/13 0052 11/23/13 0310  WBC 12.0*  --  16.1* 21.2*  HGB 12.7* 13.9 13.3 12.2*  HCT 38.5* 41.0 41.6 37.9*  PLT 358  --  335 352  MCV 86.7  --  86.7 85.6  MCH 28.6  --  27.7 27.5  MCHC 33.0  --  32.0 32.2  RDW 14.1  --  14.3 14.4  LYMPHSABS 5.0*  --   --   --   MONOABS 1.3*  --   --   --   EOSABS 0.5  --   --   --   BASOSABS 0.1  --   --   --     Chemistries   Recent Labs Lab 11/21/13 0323 11/21/13 1305 11/22/13 0052 11/23/13 0310  NA 135*  --  138 137  K 4.3  --  4.3 3.9  CL 97  --  92* 94*  CO2  --   --  34* 31  GLUCOSE 94  --  121* 114*  BUN 11  --  16 25*  CREATININE 1.00  --  0.92 0.75  CALCIUM  --   --  9.2 8.9  MG  --  2.0  --   --   AST  --   --  38*  --   ALT  --   --  20  --   ALKPHOS  --   --  122*  --   BILITOT  --   --  0.3  --    ------------------------------------------------------------------------------------------------------------------ estimated creatinine clearance is 71.8 ml/min (by C-G formula based on Cr of 0.75). ------------------------------------------------------------------------------------------------------------------ No results found for this basename: HGBA1C,  in the last 72 hours ------------------------------------------------------------------------------------------------------------------  Recent Labs  11/21/13 1305  CHOL 230*  HDL 62  LDLCALC  159*  TRIG 43  CHOLHDL 3.7   ------------------------------------------------------------------------------------------------------------------  Recent Labs  11/21/13 1305  TSH 0.747   ------------------------------------------------------------------------------------------------------------------ No results found for this basename: VITAMINB12, FOLATE, FERRITIN, TIBC, IRON, RETICCTPCT,  in the last 72 hours  Coagulation profile  Recent Labs Lab 11/21/13 1305  INR 0.97     Recent Labs  11/21/13 0419  DDIMER 0.28    Cardiac Enzymes  Recent Labs Lab 11/21/13 1305 11/21/13 1850 11/22/13 0052  TROPONINI 0.71* 1.10* 2.05*   ------------------------------------------------------------------------------------------------------------------ No components found with this basename: POCBNP,      Time Spent in minutes  60   Jonathandavid Marlett M.D on 11/23/2013 at 4:32 PM  [email or web address] - password [facility] Office  [phone]   **Disclaimer: This note may have been dictated with  voice recognition software. Similar sounding words can inadvertently be transcribed and this note may contain transcription errors which may not have been corrected upon publication of note.**

## 2013-11-23 NOTE — Clinical Social Work Psychosocial (Signed)
Clinical Social Work Department BRIEF PSYCHOSOCIAL ASSESSMENT 11/23/2013  Patient:  Philip Coleman, Philip Coleman     Account Number:  [id number]     Admit date:  11/21/2013  Clinical Social Worker:  Hubert Azure  Date/Time:  11/23/2013 03:59 PM  Referred by:  Physician  Date Referred:  11/23/2013 Referred for  Transportation assistance   Other Referral:   Interview type:  Patient Other interview type:    PSYCHOSOCIAL DATA Living Status:  ALONE Admitted from facility:   Level of care:   Primary support name:  None provided Primary support relationship to patient:   Degree of support available:   Poor. Patient stated he does not have a current support system and there is no one he can rely on for help.    CURRENT CONCERNS Current Concerns  Other - See comment   Other Concerns:   Transportation assistance.    SOCIAL WORK ASSESSMENT / PLAN CSW met with patient who was alert and oriented. CSW introduced self and explained role. CSW discussed transportation with patient. Per patient, he lives alone and although he has 2 brothers and 3 sisters, no one is going to come pick him up. Patient stated because of "different things" that have happened over the years, his family will not help him. Patient stated he has a daughter who resides in [place], but he does not want to live with her. Per patient he and daughter talk once a week, but he does not consider her a support. Patient reported receiving his medication with [facility] who delivers it to his home. Patient reported receiving disability and stated he desires to live "somewhere else." Patient requested housing information.   Assessment/plan status:  Information/Referral to [employer] Other assessment/ plan:   Information/referral to community resources:   CSW to provide patient with information on [address] [employer] and housing options.    PATIENT'S/FAMILY'S RESPONSE TO PLAN OF CARE: Patient was cooperative and  engaging during discussion.   [place], [place] Weekend Clinical Social Worker [phone]

## 2013-11-23 NOTE — Progress Notes (Signed)
Subjective: A little chest tightness earlier  Breathing is stable.   Objective: Filed Vitals:   11/23/13 0520 11/23/13 0759 11/23/13 0800 11/23/13 0827  BP:  122/64 105/67   Pulse:  81 69   Temp:  97.7 F (36.5 C)    TempSrc:  Oral    Resp:  16 17   Height: 5\' 7"  (1.702 m)     Weight: 108 lb 7.5 oz (49.2 kg)     SpO2:  99% 98% 96%   Weight change: 14.1 oz (0.4 kg)  Intake/Output Summary (Last 24 hours) at 11/23/13 0949 Last data filed at 11/23/13 0900  Gross per 24 hour  Intake   2119 ml  Output   2250 ml  Net   -131 ml   Net  I/o since admit:  2.76 L negative    General: Alert, awake, oriented x3, in no acute distress Neck:  JVP is normal Heart: Regular rate and rhythm, without murmurs, rubs, gallops.  Lungs: Clear to auscultation.  No rales or wheezes. Exemities:  No edema.   Neuro: Grossly intact, nonfocal.   Lab Results: Results for orders placed during the hospital encounter of 11/21/13 (from the past 24 hour(s))  HEPARIN LEVEL (UNFRACTIONATED)     Status: Abnormal   Collection Time    11/22/13 11:30 AM      Result Value Ref Range   Heparin Unfractionated 0.25 (*) 0.30 - 0.70 IU/mL  POCT ACTIVATED CLOTTING TIME     Status: None   Collection Time    11/22/13  1:08 PM      Result Value Ref Range   Activated Clotting Time 304    POCT ACTIVATED CLOTTING TIME     Status: None   Collection Time    11/22/13  1:38 PM      Result Value Ref Range   Activated Clotting Time 326    CBC     Status: Abnormal   Collection Time    11/23/13  3:10 AM      Result Value Ref Range   WBC 21.2 (*) 4.0 - 10.5 K/uL   RBC 4.43  4.22 - 5.81 MIL/uL   Hemoglobin 12.2 (*) 13.0 - 17.0 g/dL   HCT 37.9 (*) 39.0 - 52.0 %   MCV 85.6  78.0 - 100.0 fL   MCH 27.5  26.0 - 34.0 pg   MCHC 32.2  30.0 - 36.0 g/dL   RDW 14.4  11.5 - 15.5 %   Platelets 352  150 - 400 K/uL  BASIC METABOLIC PANEL     Status: Abnormal   Collection Time    11/23/13  3:10 AM      Result Value Ref Range   Sodium  137  137 - 147 mEq/L   Potassium 3.9  3.7 - 5.3 mEq/L   Chloride 94 (*) 96 - 112 mEq/L   CO2 31  19 - 32 mEq/L   Glucose, Bld 114 (*) 70 - 99 mg/dL   BUN 25 (*) 6 - 23 mg/dL   Creatinine, Ser 0.75  0.50 - 1.35 mg/dL   Calcium 8.9  8.4 - 10.5 mg/dL   GFR calc non Af Amer >90  >90 mL/min   GFR calc Af Amer >90  >90 mL/min   Anion gap 12  5 - 15    Studies/Results: No results found.  Medications:Reviewed   @PROBHOSP @  1.  CAD  S/p NSTEMI  Patient underwent PCI yesterday. Not a CABG candidate :  LM 50%; LAD  90% with thrombus:  LCx 50% ostial  95% mid.  Underwent PCI of mid LAD with Promus stent.    PCKf mid LCX  Unable to pass stent due to LM dz.   LVEF  35 to 40%  LVEDP 28   2.  CHF:   Patient put out 100 cc since yesterday  Not much since LVEDP checked yesteday Continue lasix.   3.  COPD  On home o2  4.  Dispo.  [place] has no way home  Unable to get meds  Will contact SW  Prob d/c on Monday.    LOS: 2 days   Dorris Carnes 11/23/2013, 9:49 AM
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
## 2013-11-24 ENCOUNTER — Inpatient Hospital Stay (HOSPITAL_COMMUNITY): Payer: Medicare Other

## 2013-11-24 LAB — BLOOD GAS, ARTERIAL
ACID-BASE EXCESS: 8.6 mmol/L — AB (ref 0.0–2.0)
Bicarbonate: 32.8 mEq/L — ABNORMAL HIGH (ref 20.0–24.0)
Drawn by: 129711
O2 Content: 3 L/min
O2 SAT: 95 %
PATIENT TEMPERATURE: 98.6
PO2 ART: 73.7 mmHg — AB (ref 80.0–100.0)
TCO2: 34.2 mmol/L (ref 0–100)
pCO2 arterial: 47.3 mmHg — ABNORMAL HIGH (ref 35.0–45.0)
pH, Arterial: 7.455 — ABNORMAL HIGH (ref 7.350–7.450)

## 2013-11-24 LAB — CBC
HCT: 41.4 % (ref 39.0–52.0)
HEMOGLOBIN: 13 g/dL (ref 13.0–17.0)
MCH: 28 pg (ref 26.0–34.0)
MCHC: 31.4 g/dL (ref 30.0–36.0)
MCV: 89.2 fL (ref 78.0–100.0)
Platelets: 352 10*3/uL (ref 150–400)
RBC: 4.64 MIL/uL (ref 4.22–5.81)
RDW: 14.5 % (ref 11.5–15.5)
WBC: 15.5 10*3/uL — ABNORMAL HIGH (ref 4.0–10.5)

## 2013-11-24 MED ORDER — METOPROLOL TARTRATE 25 MG PO TABS
25.0000 mg | ORAL_TABLET | Freq: Two times a day (BID) | ORAL | Status: DC
  Administered 2013-11-24 – 2013-11-26 (×4): 25 mg via ORAL
  Filled 2013-11-24 (×5): qty 1

## 2013-11-24 MED ORDER — LEVALBUTEROL HCL 1.25 MG/0.5ML IN NEBU
1.2500 mg | INHALATION_SOLUTION | Freq: Four times a day (QID) | RESPIRATORY_TRACT | Status: DC | PRN
  Filled 2013-11-24: qty 0.5

## 2013-11-24 MED ORDER — SODIUM CHLORIDE 0.9 % IV BOLUS (SEPSIS)
500.0000 mL | Freq: Once | INTRAVENOUS | Status: AC
  Administered 2013-11-24: 500 mL via INTRAVENOUS

## 2013-11-24 MED ORDER — FUROSEMIDE 40 MG PO TABS
60.0000 mg | ORAL_TABLET | Freq: Every day | ORAL | Status: DC
  Administered 2013-11-25 – 2013-11-26 (×2): 60 mg via ORAL
  Filled 2013-11-24 (×2): qty 1

## 2013-11-24 MED ORDER — FUROSEMIDE 10 MG/ML IJ SOLN
40.0000 mg | Freq: Two times a day (BID) | INTRAMUSCULAR | Status: DC
  Administered 2013-11-24: 40 mg via INTRAVENOUS
  Filled 2013-11-24: qty 4

## 2013-11-24 NOTE — Progress Notes (Signed)
Patient had c/o SOB and said he felt "weak". Obtained a set of VS and paged Dr.Abrol to notify. New orders were written.

## 2013-11-24 NOTE — Progress Notes (Signed)
Called by RN. Pt's B/P low. He received a Liter of NS. His B/P was in the 70s, now 85 and he feels better. I suggested we continue to watch this for now, no more IVF unless he becomes symptomatic or his B/P falls again in which case we may want to try  Dopamine.  Kerin Ransom PA-C 11/24/2013 3:40 PM

## 2013-11-24 NOTE — Progress Notes (Addendum)
Subjective: A little chest tightness earlier  Breathing is stable.   Objective: Filed Vitals:   11/23/13 1900 11/23/13 2008 11/23/13 2026 11/24/13 0500  BP: 92/53 109/70  103/59  Pulse: 106 83 86 80  Temp: 98.3 F (36.8 C) 98.1 F (36.7 C)  98.1 F (36.7 C)  TempSrc: Oral Oral  Oral  Resp: 18 18 18 18   Height: 5\' 7"  (1.702 m)     Weight: 109 lb 11.2 oz (49.76 kg)   109 lb 8 oz (49.669 kg)  SpO2: 94% 100% 96% 98%   Weight change: 1 lb 3.7 oz (0.56 kg)  Intake/Output Summary (Last 24 hours) at 11/24/13 0933 Last data filed at 11/24/13 0827  Gross per 24 hour  Intake    760 ml  Output    800 ml  Net    -40 ml   Net  I/o since admit:  2.76 L negative    General: Alert, awake, oriented x3, in no acute distress Neck:  JVP is normal Heart: Regular rate and rhythm, without murmurs, rubs, gallops.  Lungs: Clear to auscultation.  No rales or wheezes. Exemities:  No edema.   Neuro: Grossly intact, nonfocal.   Lab Results: Results for orders placed during the hospital encounter of 11/21/13 (from the past 24 hour(s))  CBC     Status: Abnormal   Collection Time    11/24/13  3:00 AM      Result Value Ref Range   WBC 15.5 (*) 4.0 - 10.5 K/uL   RBC 4.64  4.22 - 5.81 MIL/uL   Hemoglobin 13.0  13.0 - 17.0 g/dL   HCT 41.4  39.0 - 52.0 %   MCV 89.2  78.0 - 100.0 fL   MCH 28.0  26.0 - 34.0 pg   MCHC 31.4  30.0 - 36.0 g/dL   RDW 14.5  11.5 - 15.5 %   Platelets 352  150 - 400 K/uL    Studies/Results: No results found.  Medications:Reviewed   @PROBHOSP @  1.  CAD  S/p NSTEMI  Patient underwent PCI yesterday. Not a CABG candidate :  LM 50%; LAD 90% with thrombus:  LCx 50% ostial  95% mid.  Underwent PCI of mid LAD with Promus stent.    He had balloon angioplasty to his LCx stenosis.  Dr. Irish Lack was not able to pass the stent through the LM into the LCx.    Needs to take ASA and Brilinta for at least 1 year.     LVEF  35 to 40%  LVEDP 28   2.  CHF:     Has normal LF systolic  function by echo, by cath the EF looked moderately depressed - 35-40%.   has grade 1 diastolic dysfunction.  3.  COPD  On home o2.  Ive advised him to stop smoking.   4.  Dispo.  He can be discharged home today. Follow up with Dr. Irish Lack in the office in several weeks.       LOS: 3 days   Darden Amber. 11/24/2013, 9:33 AM

## 2013-11-24 NOTE — Progress Notes (Addendum)
[facility] PROGRESS NOTE  Philip CARLINI TMA:[id number] DOB: [birth year] DOA: 11/21/2013 PCP: Delrae Rend, NP  Assessment/Plan: Active Problems:   Atypical chest pain   Unstable angina   NSTEMI (non-ST elevated myocardial infarction)    Subjective:   Philip Coleman has no complaints today- has a cough which he states is not unusual for him. States that he will try to stop smoking. Home o2 dependent -3 L Assessment & Plan   1.NSTEMI with Unstable Angina- seen by cardiology underwent left heart cath with PCI to LAD, kindly see report below, currently on dual antiplatelet therapy for one year along with beta blocker and statin for secondary prevention, now symptom free ,LVEF 35 to 40% LVEDP 28  need a repeat echo in 4-6 weeks to see if his LV dysfunction has improved. Anticipate d/c home in the next 1-2 days  2. History of chronic diastolic heart failure per echo gram and 2013 EF 50-55% now 35-40%. Mild acute on chronic decompensation, repeat echo pending, currently on beta blocker, Lasix, salt and fluid restriction, monitor clinically.  IV lasix DC due to low BP, pt  Sob , CTA negative for PE,dissection,change to PO lasix in am if BP tolerates    3. COPD with mild exacerbation. Currently on azithromycin- switch to oral.  No wheezing no SOB, Stop Rocephin along with steroids, continue nebulizer treatment as needed. Titrate oxygen to keep pulse ox 90%. Check pulse ox on ambulation   4. GERD. On PPI continue.  5. Dyslipidemia continue statin but at increased dose as LDL was not at goal.  6.Smoking Hx - counseled to quit.    Code Status: Full  Disposition Plan: Home on Monday once arrangements are made for cardiac rehabilitation and antiplatelet therapy  Procedures  Echo pending  L Heart Cath 11-22-13 Dr Irish Lack  IMPRESSIONS:  1. Moderate disease in the distal left main coronary artery. 2. Successful PCI of the mid left anterior descending artery with a 3.0 x 38 mm  Promus drug-eluting stent, postdilated to 3.6 mm in diameter.. 3. Successful balloon angioplasty of the mid left circumflex artery with a 2.0 x 12 balloon. There is a residual 50% stenosis in the mid circumflex. Due to the distal left main disease and ostial circumflex disease, we were unable to pass a stent to the mid circumflex. We stopped after angioplasty to avoid causing any trauma to the distal left main. RECOMMENDATION: Continue dual antiplatelet therapy for at least a year. Continue antianginal therapy. I think the majority of his symptoms were coming from the LAD lesion. This was successfully treated with a stent. The circumflex lesion could not be stented. Continue aggressive medical therapy. He will need a repeat echo in 4-6 weeks to see if his LV dysfunction has improved. Anticipate d/c home in the next 1-2 days. Needs medical therapy for his heart failure as well.  Consults cards      Objective: Filed Vitals:   11/23/13 1900 11/23/13 2008 11/23/13 2026 11/24/13 0500  BP: 92/53 109/70  103/59  Pulse: 106 83 86 80  Temp: 98.3 F (36.8 C) 98.1 F (36.7 C)  98.1 F (36.7 C)  TempSrc: Oral Oral  Oral  Resp: 18 18 18 18   Height: 5\' 7"  (1.702 m)     Weight: 49.76 kg (109 lb 11.2 oz)   49.669 kg (109 lb 8 oz)  SpO2: 94% 100% 96% 98%    Intake/Output Summary (Last 24 hours) at 11/24/13 2563 Last data filed at 11/24/13 [phone]  Gross per 24 hour  Intake    760 ml  Output    800 ml  Net    -40 ml    Exam:  General: Alert, awake, oriented x3, in no acute distress  Neck: JVP is normal  Heart: Regular rate and rhythm, without murmurs, rubs, gallops.  Lungs: Clear to auscultation. No rales or wheezes.  Exemities: No edema.  Neuro: Grossly intact, nonfocal.    Data Reviewed: Basic Metabolic Panel:  Recent Labs Lab 11/21/13 0323 11/21/13 1305 11/22/13 0052 11/23/13 0310  NA 135*  --  138 137  K 4.3  --  4.3 3.9  CL 97  --  92* 94*  CO2  --   --  34* 31  GLUCOSE 94  --   121* 114*  BUN 11  --  16 25*  CREATININE 1.00  --  0.92 0.75  CALCIUM  --   --  9.2 8.9  MG  --  2.0  --   --   PHOS  --  2.8  --   --     Liver Function Tests:  Recent Labs Lab 11/22/13 0052  AST 38*  ALT 20  ALKPHOS 122*  BILITOT 0.3  PROT 7.8  ALBUMIN 3.5   No results found for this basename: LIPASE, AMYLASE,  in the last 168 hours No results found for this basename: AMMONIA,  in the last 168 hours  CBC:  Recent Labs Lab 11/21/13 0313 11/21/13 0323 11/22/13 0052 11/23/13 0310 11/24/13 0300  WBC 12.0*  --  16.1* 21.2* 15.5*  NEUTROABS 5.1  --   --   --   --   HGB 12.7* 13.9 13.3 12.2* 13.0  HCT 38.5* 41.0 41.6 37.9* 41.4  MCV 86.7  --  86.7 85.6 89.2  PLT 358  --  335 352 352    Cardiac Enzymes:  Recent Labs Lab 11/21/13 1305 11/21/13 1850 11/22/13 0052  TROPONINI 0.71* 1.10* 2.05*   BNP (last 3 results) No results found for this basename: PROBNP,  in the last 8760 hours   CBG: No results found for this basename: GLUCAP,  in the last 168 hours  Recent Results (from the past 240 hour(s))  MRSA PCR SCREENING     Status: None   Collection Time    11/21/13 12:17 PM      Result Value Ref Range Status   MRSA by PCR NEGATIVE  NEGATIVE Final   Comment:            The GeneXpert MRSA Assay (FDA     approved for NASAL specimens     only), is one component of a     comprehensive MRSA colonization     surveillance program. It is not     intended to diagnose MRSA     infection nor to guide or     monitor treatment for     MRSA infections.  CULTURE, BLOOD (ROUTINE X 2)     Status: None   Collection Time    11/21/13 12:53 PM      Result Value Ref Range Status   Specimen Description BLOOD RIGHT ANTECUBITAL   Final   Special Requests BOTTLES DRAWN AEROBIC AND ANAEROBIC 10CC   Final   Culture  Setup Time     Final   Value: 11/21/2013 17:36     Performed at [facility]   Culture     Final   Value:        BLOOD  CULTURE RECEIVED NO GROWTH TO  DATE CULTURE WILL BE HELD FOR 5 DAYS BEFORE ISSUING A FINAL NEGATIVE REPORT     Performed at [facility]   Report Status PENDING   Incomplete  CULTURE, BLOOD (ROUTINE X 2)     Status: None   Collection Time    11/21/13  1:05 PM      Result Value Ref Range Status   Specimen Description BLOOD RIGHT ANTECUBITAL   Final   Special Requests BOTTLES DRAWN AEROBIC ONLY 6CC   Final   Culture  Setup Time     Final   Value: 11/21/2013 17:36     Performed at [facility]   Culture     Final   Value:        BLOOD CULTURE RECEIVED NO GROWTH TO DATE CULTURE WILL BE HELD FOR 5 DAYS BEFORE ISSUING A FINAL NEGATIVE REPORT     Performed at [facility]   Report Status PENDING   Incomplete     Studies: Dg Chest 2 View  11/21/2013   CLINICAL DATA:  Chest pain and shortness of breath.  EXAM: CHEST  2 VIEW  COMPARISON:  Chest radiograph Sep 24, 2012  FINDINGS: Cardiomediastinal silhouette is nonsuspicious, mildly calcified aortic knob. Left apical bullous changes, pleural thickening and scarring right lung apex. COPD with slightly increasing right middle lobe airspace opacity. No pleural effusions. No pneumothorax.  Remote left clavicle fracture.  Surgical clips at the GE junction.  IMPRESSION: COPD and bullous changes with increasing strandy densities in right middle lobe which could reflect pneumonia though, recommend followup chest radiograph after treatment to verify improvement.   Electronically Signed   By: Elon Alas   On: 11/21/2013 03:52   Ct Angio Chest Pe W/cm &/or Wo Cm  11/21/2013   CLINICAL DATA:  Assess aorta. Chest pain, shortness of breath. History of VATS.  EXAM: CT ANGIOGRAPHY CHEST WITH CONTRAST  TECHNIQUE: Multidetector CT imaging of the chest was performed using the standard protocol during bolus administration of intravenous contrast. Multiplanar CT image reconstructions and MIPs were obtained to evaluate the vascular anatomy.  CONTRAST:  139mL OMNIPAQUE IOHEXOL  350 MG/ML SOLN  COMPARISON:  Chest radiograph November 21, 2013 and CT of the chest April 01, 2012  FINDINGS: Noncontrast imaging of aorta shows homogeneous density, mild to moderate calcific atherosclerosis. Ascending aorta is normal in course caliber. Moderate calcific atherosclerosis of the carotid bulb with eccentric intimal thickening propagating into the origin of the left subclavian artery, measuring up to 6 mm, narrowing the true lumen by less than 50%. Descending aorta is normal in course and caliber, mild luminal regularity data intimal thickening and calcific atherosclerosis. No dissection, aneurysm, contrast extravasation, periaortic fluid collections. Diffuse bronchial wall thickening. Though not tailored for evaluation, no central pulmonary arterial filling defects.  Severe bilateral apical bullous changes in a background of severe centrilobular emphysema. Large right lung base bulla with superimposed right lower lobe hypoenhancing consolidation, axial 62/93. No pleural effusions. Right apical pleural thickening and scarring. No pneumothorax.  Tracheobronchial tree is patent and midline. No lymphadenopathy by CT size criteria. Thoracic esophagus is unremarkable. Included view of the abdomen is nonsuspicious. 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  Stable 15 mm dominant thyroid lesion at the level of the isthmus. Mild degenerative change of the thoracic spine, scattered Schmorl's nodes.  Review of the MIP images confirms the above findings.  IMPRESSION: No acute vascular process, specifically no aortic dissection.  Peribronchial  wall thickening which likely reflect bronchitis. Severe emphysema with similar right lung base presumed atelectasis and right apical pleural thickening/scarring.  Stable 15 mm dominant isthmus thyroid lesion for which follow-up ultrasound is recommended on a nonemergent basis.   Electronically Signed   By: Elon Alas   On: 11/21/2013 06:37    Scheduled  Meds: . aspirin  81 mg Oral Daily  . atorvastatin  80 mg Oral Daily  . azithromycin  500 mg Oral Daily  . budesonide-formoterol  2 puff Inhalation BID  . folic acid  1 mg Oral Daily  . furosemide  40 mg Intravenous Q12H  . ipratropium-albuterol  3 mL Nebulization TID  . isosorbide mononitrate  30 mg Oral Daily  . metoprolol tartrate  25 mg Oral BID  . multivitamin with minerals  1 tablet Oral Daily  . pantoprazole  40 mg Oral Daily  . sodium chloride  3 mL Intravenous Q12H  . thiamine  100 mg Oral Daily  . ticagrelor  90 mg Oral BID  . tiotropium  18 mcg Inhalation Daily   Continuous Infusions:   Active Problems:   Atypical chest pain   Unstable angina   NSTEMI (non-ST elevated myocardial infarction)    Time spent: 40 minutes   [facility] Pager [phone]. If 8PM-8AM, please contact night-coverage at [email or web address], password [facility] 11/24/2013, 9:07 AM  LOS: 3 days

## 2013-11-24 NOTE — Progress Notes (Signed)
Respiratory therapy note-patients o2 decreased by RN and ABG done as ordered, no distress noted and routine HHneb given at this time as well.

## 2013-11-24 NOTE — Clinical Social Work Note (Signed)
CSW continues to follow patient for psychosocial needs. CSW provided patient with housing resources to include: Section 8, [place] housing, and housing alternatives. CSW further discussed [facility] for transportation with patient. Per patient, he is coordinated with [facility], however, because of his oxygen tank, he is often exhausted with carrying the tank around. Patient stated he has an [place] and it is heavy. CSW informed patient, RNCM will be made aware for follow-up with equipment needs.   [place], [place] Weekend Clinical Social Worker [phone]

## 2013-11-24 NOTE — Progress Notes (Addendum)
Paged MD on call for cardiology to notify of low BP. Patient is asymptomatic. Spoke with Kerin Ransom, PA who was on call for Dr.Nashser. Was told to continue monitor. Dr.Abrol called also to follow up concerning patient's BP. Was told to place patient on continuous pulse oximeter and continue to monitor.

## 2013-11-25 LAB — COMPREHENSIVE METABOLIC PANEL
ALT: 23 U/L (ref 0–53)
ANION GAP: 9 (ref 5–15)
AST: 32 U/L (ref 0–37)
Albumin: 3 g/dL — ABNORMAL LOW (ref 3.5–5.2)
Alkaline Phosphatase: 100 U/L (ref 39–117)
BUN: 26 mg/dL — ABNORMAL HIGH (ref 6–23)
CALCIUM: 9.2 mg/dL (ref 8.4–10.5)
CO2: 34 mEq/L — ABNORMAL HIGH (ref 19–32)
Chloride: 96 mEq/L (ref 96–112)
Creatinine, Ser: 0.74 mg/dL (ref 0.50–1.35)
GFR calc Af Amer: >90 mL/min (ref >90)
GLUCOSE: 98 mg/dL (ref 70–99)
Potassium: 3.7 mEq/L (ref 3.7–5.3)
Sodium: 139 mEq/L (ref 137–147)
Total Bilirubin: 0.5 mg/dL (ref 0.3–1.2)
Total Protein: 6.9 g/dL (ref 6.0–8.3)

## 2013-11-25 LAB — CBC
HCT: 41.8 % (ref 39.0–52.0)
HEMOGLOBIN: 13.3 g/dL (ref 13.0–17.0)
MCH: 27.8 pg (ref 26.0–34.0)
MCHC: 31.8 g/dL (ref 30.0–36.0)
MCV: 87.4 fL (ref 78.0–100.0)
Platelets: 358 10*3/uL (ref 150–400)
RBC: 4.78 MIL/uL (ref 4.22–5.81)
RDW: 14.3 % (ref 11.5–15.5)
WBC: 15 10*3/uL — ABNORMAL HIGH (ref 4.0–10.5)

## 2013-11-25 MED ORDER — ISOSORBIDE MONONITRATE 15 MG HALF TABLET: 15.0000 mg | ORAL_TABLET | Freq: Every day | ORAL | Status: DC

## 2013-11-25 MED ORDER — FUROSEMIDE 20 MG PO TABS: 60.0000 mg | ORAL_TABLET | Freq: Every day | ORAL | Status: DC

## 2013-11-25 MED ORDER — TICAGRELOR 90 MG PO TABS: 90.0000 mg | ORAL_TABLET | Freq: Two times a day (BID) | ORAL | Status: DC

## 2013-11-25 MED ORDER — ASPIRIN 81 MG PO CHEW: 81.0000 mg | CHEWABLE_TABLET | Freq: Every day | ORAL | Status: DC

## 2013-11-25 MED ORDER — AZITHROMYCIN 500 MG PO TABS: 500.0000 mg | ORAL_TABLET | Freq: Every day | ORAL | Status: DC

## 2013-11-25 MED ORDER — METOPROLOL TARTRATE 25 MG PO TABS: 25.0000 mg | ORAL_TABLET | Freq: Two times a day (BID) | ORAL | Status: DC

## 2013-11-25 NOTE — Progress Notes (Signed)
Patient is A/Ox4 and is ambulatory with 1 person standby assist. He is on oxygen at 4 L [place] and is dependent on oxygen at home. He had no c/o pain and no signs of distress. Patient received orders for discharge late this afternoon. Started to discuss discharge paperwork with patient when he says that social work was supposed to arrange for EMS to transport him home. Looked on Amion and called Education officer, museum on call to find out, however it went to [employer]. Voicemail was left on phone. Contacted Dr.Rizwan with [facility] and notified her that social work was called and with no response. Reported to oncoming RN of situation regarding patient's delayed discharge.

## 2013-11-25 NOTE — Progress Notes (Addendum)
Patient Profile: 58 y/o male with severe, O2 dependent COPD, admitted for chest pain and ruled in for NSTEMI. S/p PCI + DES to mid LAD and balloon angioplasty to LCx. EF 60-65%.    Subjective: Denies chest pain. Has mild SOB, but states that is his baseline. Uses home O2. Hasnt been out of bed yet.  Objective: Vital signs in last 24 hours: Temp:  [97.4 F (36.3 C)-98.2 F (36.8 C)] 97.4 F (36.3 C) (07/20 0450) Pulse Rate:  [76-94] 76 (07/20 0450) Resp:  [18] 18 (07/20 0450) BP: (75-106)/(51-64) 93/60 mmHg (07/20 0450) SpO2:  [96 %-99 %] 96 % (07/20 0813) FiO2 (%):  [32 %] 32 % (07/20 0813) Weight:  [116 lb 12.8 oz (52.98 kg)] 116 lb 12.8 oz (52.98 kg) (07/20 0618) Last BM Date: 11/23/13  Intake/Output from previous day: 07/19 0701 - 07/20 0700 In: 730 [P.O.:720; I.V.:10] Out: 1400 [Urine:1400] Intake/Output this shift: Total I/O In: 360 [P.O.:360] Out: 250 [Urine:250]  Medications Current Facility-Administered Medications  Medication Dose Route Frequency Provider Last Rate Last Dose  . 0.9 %  sodium chloride infusion  250 mL Intravenous PRN Maryann Mikhail, DO      . acetaminophen (TYLENOL) tablet 650 mg  650 mg Oral Q4H PRN Peter M Martinique, MD      . aspirin chewable tablet 81 mg  81 mg Oral Daily Peter M Martinique, MD   81 mg at 11/24/13 0944  . atorvastatin (LIPITOR) tablet 80 mg  80 mg Oral Daily Thurnell Lose, MD   80 mg at 11/24/13 0944  . azithromycin (ZITHROMAX) tablet 500 mg  500 mg Oral Daily Debbe Odea, MD   500 mg at 11/24/13 0944  . budesonide-formoterol (SYMBICORT) 160-4.5 MCG/ACT inhaler 2 puff  2 puff Inhalation BID Maryann Mikhail, DO   2 puff at 11/24/13 [phone]  . folic acid (FOLVITE) tablet 1 mg  1 mg Oral Daily Maryann Mikhail, DO   1 mg at 11/24/13 0944  . furosemide (LASIX) tablet 60 mg  60 mg Oral Daily Reyne Dumas, MD      . ipratropium-albuterol (DUONEB) 0.5-2.5 (3) MG/3ML nebulizer solution 3 mL  3 mL Nebulization TID Maryann Mikhail, DO   3 mL at  11/25/13 0350  . isosorbide mononitrate (IMDUR) 24 hr tablet 30 mg  30 mg Oral Daily Thurnell Lose, MD   30 mg at 11/24/13 0944  . levalbuterol (XOPENEX) nebulizer solution 1.25 mg  1.25 mg Nebulization Q6H PRN Reyne Dumas, MD      . metoprolol tartrate (LOPRESSOR) tablet 25 mg  25 mg Oral BID Erlene Quan, PA-C   25 mg at 11/24/13 2156  . morphine 2 MG/ML injection 2 mg  2 mg Intravenous Q2H PRN Maryann Mikhail, DO   2 mg at 11/23/13 2010  . multivitamin with minerals tablet 1 tablet  1 tablet Oral Daily Maryann Mikhail, DO   1 tablet at 11/24/13 0944  . ondansetron (ZOFRAN) injection 4 mg  4 mg Intravenous Q6H PRN Peter M Martinique, MD      . pantoprazole (PROTONIX) EC tablet 40 mg  40 mg Oral Daily Maryann Mikhail, DO   40 mg at 11/24/13 0944  . sodium chloride 0.9 % injection 3 mL  3 mL Intravenous Q12H Maryann Mikhail, DO   3 mL at 11/24/13 2156  . sodium chloride 0.9 % injection 3 mL  3 mL Intravenous PRN Maryann Mikhail, DO      . thiamine (VITAMIN B-1) tablet 100 mg  100  mg Oral Daily Maryann Mikhail, DO   100 mg at 11/24/13 0944  . ticagrelor (BRILINTA) tablet 90 mg  90 mg Oral BID Peter M Martinique, MD   90 mg at 11/24/13 2157  . tiotropium (SPIRIVA) inhalation capsule 18 mcg  18 mcg Inhalation Daily Maryann Mikhail, DO   18 mcg at 11/24/13 [phone]    PE: General appearance: alert, cooperative, appears older than stated age and no distress Neck: no carotid bruit Lungs: clear to auscultation bilaterally Heart: regular rate and rhythm Extremities: no LEE Pulses: 2+ and symmetric Skin: warm and dry Neurologic: Grossly normal  Lab Results:   Recent Labs  11/23/13 0310 11/24/13 0300 11/25/13 0340  WBC 21.2* 15.5* 15.0*  HGB 12.2* 13.0 13.3  HCT 37.9* 41.4 41.8  PLT 352 352 358   BMET  Recent Labs  11/23/13 0310 11/25/13 0340  NA 137 139  K 3.9 3.7  CL 94* 96  CO2 31 34*  GLUCOSE 114* 98  BUN 25* 26*  CREATININE 0.75 0.74  CALCIUM 8.9 9.2     Assessment/Plan  Active Problems:   Atypical chest pain   Unstable angina   NSTEMI (non-ST elevated myocardial infarction)  1. CAD: S/p NSTEMI : LM 50%; LAD 90% with thrombus: LCx 50% ostial 95% mid. Not a CABG candidate. Underwent PCI of mid LAD with Promus stent. He had balloon angioplasty to his LCx stenosis. Dr. Irish Lack was not able to pass the stent through the LM into the LCx.   --Continue DAPT with ASA and Brilinta for at least 1 year.   -- Continue BB, nitrate and statin  2. CHF: Has normal LF systolic function by echo (60-65%), by cath the EF looked moderately depressed - 35-40%. Has grade 1 diastolic dysfunction.   3. COPD: On home O2. Smoking cessation advised.  4. Hypotension: Patient developed symptomatic hypotension yesterday with SBP in the 70s. S/p 1L of NSR. BP and symptoms improved. BP remains soft but stable. He is currently on Lasix, Imdur and metoprolol. May need to decrease Lasix dose. Will need close f/u. Will have RN ambulate today prior to discharge.  Dispo: Patient lives home alone. He has severe oxygen dependent COPD. He became hypotensive yesterday and required IVFs. BP is improved and stable, but still soft in the 56E systolic. Will have RN ambulate today. If stable, plan for possible discharge home today. He will need close OP f/u.     LOS: 4 days    Brittainy M. Ladoris Gene 11/25/2013 9:38 AM  Patient seen and examined and history reviewed. Agree with above findings and plan. States he feels well today. No SOB or chest pain. Up in chair. Still hasn't ambulated in halls. Low BP noted yesterday. Responded to IV fluids. Will stop nitrates. If stable with ambulation I think he can be DC today from a cardiac standpoint. Will need follow up with Dr. Irish Lack.  Peter Martinique, [place] 11/25/2013 10:54 AM

## 2013-11-25 NOTE — Progress Notes (Addendum)
CARDIAC REHAB PHASE I   PRE:  Rate/Rhythm: 91  BP:  Supine:   Sitting: 85/43  Standing:    SaO2: 97 2 1/2 L   MODE:  Ambulation: 400 ft   POST:  Rate/Rhythm: 95  BP:  Supine:   Sitting: 83/60  Standing:   SaO2: 97 4 L 1335-1415 On arrival pt in bed, linens wet with urine. Pt to bathroom. Assisted X1 and used O2 4L to ambulate. He states that he uses 3-4 L to ambulate. He states that at times he pushes it up to 7-8 L when he is SOB and wanted me to do that to start him off walking.I walked him on 4L and left it there for the entire walk. Pt ambulated 400 feet with one standing rest stop, for SOB. In hall O2 sat on 4L 96% on return to room sat 97% on 4L.Before walk and after walk BP 20'E systolic. He was without c/o of dizziness with walking. Pt to recliner after walk with call light in reach.  Rodney Langton RN 11/25/2013 2:19 PM

## 2013-11-25 NOTE — Discharge Summary (Addendum)
Physician Discharge Summary  Philip Coleman PYP:[id number] DOB: 1956-02-28 DOA: 11/21/2013  PCP: Delrae Rend, NP  Admit date: 11/21/2013 Discharge date: 11/25/2013  Time spent: >45 minutes was  Patient was not discharged on this day as planned. Please see updated discharge summary from 7/21  Recommendations for Outpatient Follow-up:  Follow with cardiology Follow with PCP in 1 wk- ensure he is not being overdiuresed with Lasix- check orthostatics and BP  Discharge Diagnoses:  Principal Problem:   NSTEMI (non-ST elevated myocardial infarction) Active Problems:   COPD with emphysema   Tobacco abuse   CAD in native artery   Systolic CHF, acute   Discharge Condition: stable  Diet recommendation: heart healthy and low sodium  Filed Weights   11/23/13 1900 11/24/13 0500 11/25/13 0618  Weight: 49.76 kg (109 lb 11.2 oz) 49.669 kg (109 lb 8 oz) 52.98 kg (116 lb 12.8 oz)    History of present illness:  58 y/o male with COPD on 3-4 L O2 at home admitted with dyspnea and chest pain. Found ot have NSTEMI.  Hospital Course:  1.NSTEMI with Unstable Angina- seen by cardiology underwent left heart cath with PCI to LAD, currently on dual antiplatelet therapy along with beta blocker and statin for secondary prevention, now symptom-free, is stable and cleared by cardiology - will need ECHO in 4-6 2 wks  2. Acute on chronic CHF- systolic History of chronic diastolic heart failure per echo gram and 2013 EF 50-55%.  Now EF 35-40% per Cath although ECHO is unchanged from his prior - currently on beta blocker, Lasix, salt and fluid restriction - SBP dropped yesterday to 70s and dose of Imdur was cut back from 30 to 15- his Lasix was held and he received 1 L NS.   3. COPD with mild exacerbation.  Currently on azithromycin- tomorrow will be his third day of 500 mg after which it should be stopped -cont Home O2  4. GERD. On PPI continue.   5. Dyslipidemia continue statin but at  increased dose as LDL was not at goal.   6.Smoking Hx - counseled to quit.   7. Erectile dysfunction- advised not to take Sildenafil when on Imdur   Procedures: Cardiac cath - 7/17--S/p NSTEMI : IMPRESSIONS:  1. Moderate disease in the distal left main coronary artery. 2. Successful PCI of the mid left anterior descending artery with a 3.0 x 38 mm Promus drug-eluting stent, postdilated to 3.6 mm in diameter.. 3. Successful balloon angioplasty of the mid left circumflex artery with a 2.0 x 12 balloon. There is a residual 50% stenosis in the mid circumflex. Due to the distal left main disease and ostial circumflex disease, we were unable to pass a stent to the mid circumflex. We stopped after angioplasty to avoid causing any trauma to the distal left main. RECOMMENDATION: Continue dual antiplatelet therapy for at least a year. Continue antianginal therapy. I think the majority of his symptoms were coming from the LAD lesion. This was successfully treated with a stent. The circumflex lesion could not be stented. Continue aggressive medical therapy. He will need a repeat echo in 4-6 weeks to see if his LV dysfunction has improved. Anticipate d/c home in the next 1-2 days. Needs medical therapy for his heart failure as well.   Consultations:  cardiology  Discharge Exam: Filed Vitals:   11/25/13 1614  BP: 106/64  Pulse: 92  Temp:   Resp:     Awake Alert, Oriented X 3, No new F.N deficits, Normal  affect  [place].AT,PERRAL  Supple Neck,No JVD, No cervical lymphadenopathy appriciated.  Symmetrical Chest wall movement, Good air movement bilaterally, CTAB  RRR,No Gallops,Rubs or new Murmurs, No Parasternal Heave  +ve B.Sounds, Abd Soft, No tenderness, No organomegaly appriciated, No rebound - guarding or rigidity.  No Cyanosis, Clubbing or edema, No new Rash or bruise    Discharge Instructions You were cared for by a hospitalist during your hospital stay. If you have any questions about your  discharge medications or the care you received while you were in the hospital after you are discharged, you can call the unit and asked to speak with the hospitalist on call if the hospitalist that took care of you is not available. Once you are discharged, your primary care physician will handle any further medical issues. Please note that NO REFILLS for any discharge medications will be authorized once you are discharged, as it is imperative that you return to your primary care physician (or establish a relationship with a primary care physician if you do not have one) for your aftercare needs so that they can reassess your need for medications and monitor your lab values.      Discharge Instructions   Diet - low sodium heart healthy    Complete by:  As directed      Increase activity slowly    Complete by:  As directed             Medication List    STOP taking these medications       sildenafil 25 MG tablet  Commonly known as:  VIAGRA      TAKE these medications       albuterol 108 (90 BASE) MCG/ACT inhaler  Commonly known as:  PROAIR HFA  Inhale 2 puffs into the lungs every 6 (six) hours as needed for wheezing or shortness of breath.     albuterol (2.5 MG/3ML) 0.083% nebulizer solution  Commonly known as:  PROVENTIL  Take 3 mLs (2.5 mg total) by nebulization every 6 (six) hours as needed for wheezing or shortness of breath.     aspirin 81 MG chewable tablet  Chew 1 tablet (81 mg total) by mouth daily.     atorvastatin 40 MG tablet  Commonly known as:  LIPITOR  Take 40 mg by mouth daily.     azithromycin 500 MG tablet  Commonly known as:  ZITHROMAX  Take 1 tablet (500 mg total) by mouth daily.     budesonide-formoterol 160-4.5 MCG/ACT inhaler  Commonly known as:  SYMBICORT  Inhale 2 puffs into the lungs 2 (two) times daily.     furosemide 20 MG tablet  Commonly known as:  LASIX  Take 3 tablets (60 mg total) by mouth daily.     isosorbide mononitrate 15 mg Tb24 24  hr tablet  Commonly known as:  IMDUR  Take 0.5 tablets (15 mg total) by mouth daily.  Start taking on:  11/26/2013     metoprolol tartrate 25 MG tablet  Commonly known as:  LOPRESSOR  Take 1 tablet (25 mg total) by mouth 2 (two) times daily.     omeprazole 20 MG capsule  Commonly known as:  PRILOSEC  Take 20 mg by mouth daily.     ticagrelor 90 MG Tabs tablet  Commonly known as:  BRILINTA  Take 1 tablet (90 mg total) by mouth 2 (two) times daily.     tiotropium 18 MCG inhalation capsule  Commonly known as:  [place] 18  mcg into inhaler and inhale daily.     traMADol 50 MG tablet  Commonly known as:  ULTRAM  Take 100 mg by mouth every 6 (six) hours as needed for moderate pain.       Allergies  Allergen Reactions  . Codeine Hives and Nausea And Vomiting   Follow-up Information   Follow up with Jettie Booze., MD. (office will call you)    Specialty:  Interventional Cardiology   Contact information:   [address] [place] [address] [phone]        The results of significant diagnostics from this hospitalization (including imaging, microbiology, ancillary and laboratory) are listed below for reference.    Significant Diagnostic Studies: Dg Chest 2 View  11/21/2013   CLINICAL DATA:  Chest pain and shortness of breath.  EXAM: CHEST  2 VIEW  COMPARISON:  Chest radiograph Sep 24, 2012  FINDINGS: Cardiomediastinal silhouette is nonsuspicious, mildly calcified aortic knob. Left apical bullous changes, pleural thickening and scarring right lung apex. COPD with slightly increasing right middle lobe airspace opacity. No pleural effusions. No pneumothorax.  Remote left clavicle fracture.  Surgical clips at the GE junction.  IMPRESSION: COPD and bullous changes with increasing strandy densities in right middle lobe which could reflect pneumonia though, recommend followup chest radiograph after treatment to verify improvement.   Electronically  Signed   By: Elon Alas   On: 11/21/2013 03:52   Ct Angio Chest Pe W/cm &/or Wo Cm  11/21/2013   CLINICAL DATA:  Assess aorta. Chest pain, shortness of breath. History of VATS.  EXAM: CT ANGIOGRAPHY CHEST WITH CONTRAST  TECHNIQUE: Multidetector CT imaging of the chest was performed using the standard protocol during bolus administration of intravenous contrast. Multiplanar CT image reconstructions and MIPs were obtained to evaluate the vascular anatomy.  CONTRAST:  142mL OMNIPAQUE IOHEXOL 350 MG/ML SOLN  COMPARISON:  Chest radiograph November 21, 2013 and CT of the chest April 01, 2012  FINDINGS: Noncontrast imaging of aorta shows homogeneous density, mild to moderate calcific atherosclerosis. Ascending aorta is normal in course caliber. Moderate calcific atherosclerosis of the carotid bulb with eccentric intimal thickening propagating into the origin of the left subclavian artery, measuring up to 6 mm, narrowing the true lumen by less than 50%. Descending aorta is normal in course and caliber, mild luminal regularity data intimal thickening and calcific atherosclerosis. No dissection, aneurysm, contrast extravasation, periaortic fluid collections. Diffuse bronchial wall thickening. Though not tailored for evaluation, no central pulmonary arterial filling defects.  Severe bilateral apical bullous changes in a background of severe centrilobular emphysema. Large right lung base bulla with superimposed right lower lobe hypoenhancing consolidation, axial 62/93. No pleural effusions. Right apical pleural thickening and scarring. No pneumothorax.  Tracheobronchial tree is patent and midline. No lymphadenopathy by CT size criteria. Thoracic esophagus is unremarkable. Included view of the abdomen is nonsuspicious. 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  Stable 15 mm dominant thyroid lesion at the level of the isthmus. Mild degenerative change of the thoracic spine, scattered Schmorl's nodes.   Review of the MIP images confirms the above findings.  IMPRESSION: No acute vascular process, specifically no aortic dissection.  Peribronchial wall thickening which likely reflect bronchitis. Severe emphysema with similar right lung base presumed atelectasis and right apical pleural thickening/scarring.  Stable 15 mm dominant isthmus thyroid lesion for which follow-up ultrasound is recommended on a nonemergent basis.   Electronically Signed   By: Elon Alas   On: 11/21/2013  06:37   Dg Chest Port 1 View  11/24/2013   CLINICAL DATA:  Shortness of breath.  Hypotension.  EXAM: PORTABLE CHEST - 1 VIEW  COMPARISON:  11/21/2013  FINDINGS: Bullous emphysema again demonstrated. No evidence of pneumothorax. No evidence of pulmonary consolidation or pleural effusion. Heart size is normal. No mass or lymphadenopathy identified. Surgical clips again seen in the epigastric region. Old left clavicle fracture deformity again noted.  IMPRESSION: Bullous emphysema.  No acute findings.   Electronically Signed   By: Earle Gell M.D.   On: 11/24/2013 14:16    Microbiology: Recent Results (from the past 240 hour(s))  MRSA PCR SCREENING     Status: None   Collection Time    11/21/13 12:17 PM      Result Value Ref Range Status   MRSA by PCR NEGATIVE  NEGATIVE Final   Comment:            The GeneXpert MRSA Assay (FDA     approved for NASAL specimens     only), is one component of a     comprehensive MRSA colonization     surveillance program. It is not     intended to diagnose MRSA     infection nor to guide or     monitor treatment for     MRSA infections.  CULTURE, BLOOD (ROUTINE X 2)     Status: None   Collection Time    11/21/13 12:53 PM      Result Value Ref Range Status   Specimen Description BLOOD RIGHT ANTECUBITAL   Final   Special Requests BOTTLES DRAWN AEROBIC AND ANAEROBIC 10CC   Final   Culture  Setup Time     Final   Value: 11/21/2013 17:36     Performed at [facility]   Culture      Final   Value:        BLOOD CULTURE RECEIVED NO GROWTH TO DATE CULTURE WILL BE HELD FOR 5 DAYS BEFORE ISSUING A FINAL NEGATIVE REPORT     Performed at [facility]   Report Status PENDING   Incomplete  CULTURE, BLOOD (ROUTINE X 2)     Status: None   Collection Time    11/21/13  1:05 PM      Result Value Ref Range Status   Specimen Description BLOOD RIGHT ANTECUBITAL   Final   Special Requests BOTTLES DRAWN AEROBIC ONLY 6CC   Final   Culture  Setup Time     Final   Value: 11/21/2013 17:36     Performed at [facility]   Culture     Final   Value:        BLOOD CULTURE RECEIVED NO GROWTH TO DATE CULTURE WILL BE HELD FOR 5 DAYS BEFORE ISSUING A FINAL NEGATIVE REPORT     Performed at [facility]   Report Status PENDING   Incomplete     Labs: Basic Metabolic Panel:  Recent Labs Lab 11/21/13 0323 11/21/13 1305 11/22/13 0052 11/23/13 0310 11/25/13 0340  NA 135*  --  138 137 139  K 4.3  --  4.3 3.9 3.7  CL 97  --  92* 94* 96  CO2  --   --  34* 31 34*  GLUCOSE 94  --  121* 114* 98  BUN 11  --  16 25* 26*  CREATININE 1.00  --  0.92 0.75 0.74  CALCIUM  --   --  9.2 8.9 9.2  MG  --  2.0  --   --   --   PHOS  --  2.8  --   --   --    Liver Function Tests:  Recent Labs Lab 11/22/13 0052 11/25/13 0340  AST 38* 32  ALT 20 23  ALKPHOS 122* 100  BILITOT 0.3 0.5  PROT 7.8 6.9  ALBUMIN 3.5 3.0*   No results found for this basename: LIPASE, AMYLASE,  in the last 168 hours No results found for this basename: AMMONIA,  in the last 168 hours CBC:  Recent Labs Lab 11/21/13 0313 11/21/13 0323 11/22/13 0052 11/23/13 0310 11/24/13 0300 11/25/13 0340  WBC 12.0*  --  16.1* 21.2* 15.5* 15.0*  NEUTROABS 5.1  --   --   --   --   --   HGB 12.7* 13.9 13.3 12.2* 13.0 13.3  HCT 38.5* 41.0 41.6 37.9* 41.4 41.8  MCV 86.7  --  86.7 85.6 89.2 87.4  PLT 358  --  335 352 352 358   Cardiac Enzymes:  Recent Labs Lab 11/21/13 1305 11/21/13 1850 11/22/13 0052   TROPONINI 0.71* 1.10* 2.05*   BNP: BNP (last 3 results) No results found for this basename: PROBNP,  in the last 8760 hours CBG: No results found for this basename: GLUCAP,  in the last 168 hours     Signed:  Debbe Odea, MD [facility] 11/25/2013, 6:47 PM    Once

## 2013-11-26 ENCOUNTER — Inpatient Hospital Stay (HOSPITAL_COMMUNITY): Payer: Medicare Other

## 2013-11-26 DIAGNOSIS — I5021 Acute systolic (congestive) heart failure: Secondary | ICD-10-CM

## 2013-11-26 LAB — CBC
HCT: 42.6 % (ref 39.0–52.0)
Hemoglobin: 13.7 g/dL (ref 13.0–17.0)
MCH: 27.9 pg (ref 26.0–34.0)
MCHC: 32.2 g/dL (ref 30.0–36.0)
MCV: 86.8 fL (ref 78.0–100.0)
Platelets: 380 10*3/uL (ref 150–400)
RBC: 4.91 MIL/uL (ref 4.22–5.81)
RDW: 14.2 % (ref 11.5–15.5)
WBC: 21.5 10*3/uL — ABNORMAL HIGH (ref 4.0–10.5)

## 2013-11-26 LAB — TROPONIN I: Troponin I: 0.42 ng/mL (ref <0.30)

## 2013-11-26 MED ORDER — RANOLAZINE ER 500 MG PO TB12
500.0000 mg | ORAL_TABLET | Freq: Two times a day (BID) | ORAL | Status: DC
  Administered 2013-11-26: 500 mg via ORAL
  Filled 2013-11-26 (×2): qty 1

## 2013-11-26 MED ORDER — ISOSORBIDE MONONITRATE 15 MG HALF TABLET
15.0000 mg | ORAL_TABLET | Freq: Every day | ORAL | Status: DC
Start: 2013-11-26 — End: 2013-11-26
  Administered 2013-11-26: 15 mg via ORAL
  Filled 2013-11-26: qty 1

## 2013-11-26 MED ORDER — OMEPRAZOLE 20 MG PO CPDR: 40.0000 mg | DELAYED_RELEASE_CAPSULE | Freq: Every day | ORAL | Status: DC

## 2013-11-26 MED ORDER — GI COCKTAIL ~~LOC~~
30.0000 mL | Freq: Once | ORAL | Status: AC
  Administered 2013-11-26: 30 mL via ORAL
  Filled 2013-11-26: qty 30

## 2013-11-26 MED ORDER — LORAZEPAM 2 MG/ML IJ SOLN
0.5000 mg | Freq: Once | INTRAMUSCULAR | Status: AC
  Administered 2013-11-26: 0.5 mg via INTRAVENOUS
  Filled 2013-11-26: qty 1

## 2013-11-26 MED ORDER — PANTOPRAZOLE SODIUM 40 MG PO TBEC
40.0000 mg | DELAYED_RELEASE_TABLET | Freq: Two times a day (BID) | ORAL | Status: DC
  Administered 2013-11-26: 40 mg via ORAL
  Filled 2013-11-26: qty 1

## 2013-11-26 MED ORDER — RANOLAZINE ER 500 MG PO TB12: 500.0000 mg | ORAL_TABLET | Freq: Two times a day (BID) | ORAL | Status: DC

## 2013-11-26 MED ORDER — NITROGLYCERIN 0.4 MG SL SUBL
SUBLINGUAL_TABLET | SUBLINGUAL | Status: AC
  Administered 2013-11-26: 0.4 mg
  Filled 2013-11-26: qty 1

## 2013-11-26 NOTE — Progress Notes (Signed)
Patient d/c'd home today per MD- CSW had met with patient earlier in the day as patient related that he did not have a ride home nor could anyone bring him a portable 02 tank from home. CSW discussed bus/taxi option but he stated that he wanted to go home via EMS. CSW cautioned patient on this expense and asked him repeatedly to reconsider other transport options. Patient became somewhat angry and demanded EMS transport. Patient had a visitor this afternoon who related multiple people who could potentially be asked to come and pick patient up but he declined each name and said "I told you I"m going my ambulance.".  CSW notified RN and arranged [facility] transport. CSW signing off.  Lorie Phenix. Pauline Good, [place]

## 2013-11-26 NOTE — Progress Notes (Signed)
Patient has visitor in the room.  Patient asked me if he could get his friend to roll him around in the wheelchair. I explained to him that i had to make sure that it was okay. Told patient and friend that he could roll around in wheelchair on the unit only, and that he has to have permission to leave the floor. Both agreed that this was okay. I went to hallway to see where patient was, and he is not in the hallway.  Patient has left the unit.

## 2013-11-26 NOTE — Progress Notes (Signed)
Patient complains of chest pain 8 out of 10 on pain scale, VSS, and EKG done. Notified hospitalist. Orders given to administer GI cocktail and notify cardiology. Notified Dr. Aundra Dubin.  Orders given to monitor patient after GI cocktail given. Will administer medication and continue to monitor patient to end of shift.

## 2013-11-26 NOTE — Progress Notes (Signed)
Pt discharged home. Discharged instructions given with verbalization of understanding. Left via ambulance going home.

## 2013-11-26 NOTE — Progress Notes (Signed)
After about an hour after GI cocktail given, patient continues to state chest pain an 8/10 and that it hurts when he breaths when asked. Pt then states that when he had a nitro sublingual tab in the ED, that it helped his chest pain. Per protocol, one and only one nitroglycerin SL tab given due to history of low blood -pressure. After [employer] tab given, patient reported pain 8 out of 10 on pain scale and stated that it felt like it was getting worse. Notified hosptialist. Orders given to continue to give morphine and give with one time dose of IV ativan. After about 10 minutes of administering IV morphine and IV ativan, patient states pain has gone down to a 7 out of 10 on pain scale. Explained that the doctor has ordered labs and a chest xray and cardiology will need to round on patient in the morning.Notified oncoming nurse. Nurse signing off at this time.

## 2013-11-26 NOTE — Progress Notes (Signed)
CARDIAC REHAB PHASE I   PRE:  Rate/Rhythm: 84 SR  BP:  Supine:   Sitting: 89/69  Standing:    SaO2: 97 3L  MODE:  Ambulation: 400 ft   POST:  Rate/Rhythm: 90  BP:  Supine:   Sitting: 111/68  Standing:    SaO2: 97 4L 1150-1220 On arrival pt in bed states that his chest is hurting "12' on pain scale of 10. States thatt he can not walk due to the pain. I encouraged him to try. Assisted X1 and used O2 4L to ambulate. Gait steady, he was able to walk 400 feet. States that he felt better after walking. Pt to recliner after walk with call light in reach. Pt anxious and asking when can I go home.   Rodney Langton RN 11/26/2013 12:28 PM

## 2013-11-26 NOTE — Progress Notes (Signed)
Pt just returned to the floor.

## 2013-11-26 NOTE — Progress Notes (Addendum)
Patient not discharged yesterday as planned. He did not have a ride home and also needed to be transported with oxygen and his oxygen was sent home. He developed burning chest pain overnight and cardiology evaluated presently troponins. Please see discharge summary from today for details. I have evaluated him again today and he is now stable for discharge.  Debbe Odea, MD

## 2013-11-26 NOTE — Discharge Summary (Signed)
Physician Discharge Summary  Philip Coleman:[id number] DOB: 1955/09/01 DOA: 11/21/2013  PCP: Delrae Rend, NP  Admit date: 11/21/2013 Discharge date: 11/26/2013  Time spent: >45 minutes  Recommendations for Outpatient Follow-up:  Follow with cardiology Follow with PCP in 1 wk- ensure he is not being overdiuresed with Lasix- check orthostatics and BP  Discharge Diagnoses:  Principal Problem:   NSTEMI (non-ST elevated myocardial infarction) Active Problems:   COPD with emphysema   Tobacco abuse   CAD in native artery   Systolic CHF, acute   Discharge Condition: stable  Diet recommendation: heart healthy and low sodium  Filed Weights   11/24/13 0500 11/25/13 0618 11/26/13 0613  Weight: 49.669 kg (109 lb 8 oz) 52.98 kg (116 lb 12.8 oz) 51.891 kg (114 lb 6.4 oz)    History of present illness:  58 y/o male with COPD on 3-4 L O2 at home admitted with dyspnea and chest pain. Found ot have NSTEMI.  Hospital Course:  1.NSTEMI with Unstable Angina- seen by cardiology underwent left heart cath with PCI to LAD, currently on dual antiplatelet therapy along with beta blocker and statin for secondary prevention - he was going to be discharged yesterday but did not have transport - he developed in his chest night and therefore her troponin was rechecked -it was noted to be elevated at 0.42 however, repeat level was less than 0.03 -Cardiology has added Ranexa to the Imdur he was already going to be discharged with and have increased his protonix. from 20-40 and mg daily - will need ECHO in 4-6 2 wks  2. Acute on chronic CHF- systolic History of chronic diastolic heart failure per echo gram and 2013 EF 50-55%.  Now EF 35-40% per Cath although ECHO is unchanged from his prior - currently on beta blocker, Lasix, salt and fluid restriction - SBP dropped to 70s and dose of Imdur was cut back from 30 to 15- his Lasix was held and he received 1 L NS.   3. COPD with mild exacerbation.   He has received 3 days of 500 mg-it will be discontinued today and -cont Home O2 at 3-4 liters  4. GERD. On PPI continue.   5. Dyslipidemia continue statin but at increased dose as LDL was not at goal.   6.Smoking Hx - counseled to quit.   7. Erectile dysfunction- advised not to take Sildenafil when on Imdur   Procedures: Cardiac cath - 7/17--S/p NSTEMI : IMPRESSIONS:  1. Moderate disease in the distal left main coronary artery. 2. Successful PCI of the mid left anterior descending artery with a 3.0 x 38 mm Promus drug-eluting stent, postdilated to 3.6 mm in diameter.. 3. Successful balloon angioplasty of the mid left circumflex artery with a 2.0 x 12 balloon. There is a residual 50% stenosis in the mid circumflex. Due to the distal left main disease and ostial circumflex disease, we were unable to pass a stent to the mid circumflex. We stopped after angioplasty to avoid causing any trauma to the distal left main. RECOMMENDATION: Continue dual antiplatelet therapy for at least a year. Continue antianginal therapy. I think the majority of his symptoms were coming from the LAD lesion. This was successfully treated with a stent. The circumflex lesion could not be stented. Continue aggressive medical therapy. He will need a repeat echo in 4-6 weeks to see if his LV dysfunction has improved. Anticipate d/c home in the next 1-2 days. Needs medical therapy for his heart failure as well.  Consultations:  cardiology  Discharge Exam: Filed Vitals:   11/26/13 1330  BP: 117/69  Pulse: 92  Temp: 98.7 F (37.1 C)  Resp: 18    Awake Alert, Oriented X 3, No new F.N deficits, Normal affect  Lillian.AT,PERRAL  Supple Neck,No JVD, No cervical lymphadenopathy appriciated.  Symmetrical Chest wall movement, Good air movement bilaterally, CTAB  RRR,No Gallops,Rubs or new Murmurs, No Parasternal Heave  +ve B.Sounds, Abd Soft, No tenderness, No organomegaly appriciated, No rebound - guarding or rigidity.   No Cyanosis, Clubbing or edema, No new Rash or bruise    Discharge Instructions You were cared for by a hospitalist during your hospital stay. If you have any questions about your discharge medications or the care you received while you were in the hospital after you are discharged, you can call the unit and asked to speak with the hospitalist on call if the hospitalist that took care of you is not available. Once you are discharged, your primary care physician will handle any further medical issues. Please note that NO REFILLS for any discharge medications will be authorized once you are discharged, as it is imperative that you return to your primary care physician (or establish a relationship with a primary care physician if you do not have one) for your aftercare needs so that they can reassess your need for medications and monitor your lab values.      Discharge Instructions   Diet - low sodium heart healthy    Complete by:  As directed      Increase activity slowly    Complete by:  As directed             Medication List    STOP taking these medications       sildenafil 25 MG tablet  Commonly known as:  VIAGRA      TAKE these medications       albuterol 108 (90 BASE) MCG/ACT inhaler  Commonly known as:  PROAIR HFA  Inhale 2 puffs into the lungs every 6 (six) hours as needed for wheezing or shortness of breath.     albuterol (2.5 MG/3ML) 0.083% nebulizer solution  Commonly known as:  PROVENTIL  Take 3 mLs (2.5 mg total) by nebulization every 6 (six) hours as needed for wheezing or shortness of breath.     aspirin 81 MG chewable tablet  Chew 1 tablet (81 mg total) by mouth daily.     atorvastatin 40 MG tablet  Commonly known as:  LIPITOR  Take 40 mg by mouth daily.     budesonide-formoterol 160-4.5 MCG/ACT inhaler  Commonly known as:  SYMBICORT  Inhale 2 puffs into the lungs 2 (two) times daily.     furosemide 20 MG tablet  Commonly known as:  LASIX  Take 3  tablets (60 mg total) by mouth daily.     isosorbide mononitrate 15 mg Tb24 24 hr tablet  Commonly known as:  IMDUR  Take 0.5 tablets (15 mg total) by mouth daily.     metoprolol tartrate 25 MG tablet  Commonly known as:  LOPRESSOR  Take 1 tablet (25 mg total) by mouth 2 (two) times daily.     omeprazole 20 MG capsule  Commonly known as:  PRILOSEC  Take 2 capsules (40 mg total) by mouth daily.     ranolazine 500 MG 12 hr tablet  Commonly known as:  RANEXA  Take 1 tablet (500 mg total) by mouth 2 (two) times daily.  ticagrelor 90 MG Tabs tablet  Commonly known as:  BRILINTA  Take 1 tablet (90 mg total) by mouth 2 (two) times daily.     tiotropium 18 MCG inhalation capsule  Commonly known as:  SPIRIVA  Place 18 mcg into inhaler and inhale daily.     traMADol 50 MG tablet  Commonly known as:  ULTRAM  Take 100 mg by mouth every 6 (six) hours as needed for moderate pain.       Allergies  Allergen Reactions  . Codeine Hives and Nausea And Vomiting   Follow-up Information   Follow up with Jettie Booze., MD. (office will call you)    Specialty:  Interventional Cardiology   Contact information:   [address] [address] [phone]       Follow up with Delrae Rend, NP In 1 week.   Specialty:  Nurse Practitioner   Contact information:   [address] [place] [address] [phone]        The results of significant diagnostics from this hospitalization (including imaging, microbiology, ancillary and laboratory) are listed below for reference.    Significant Diagnostic Studies: Dg Chest 2 View  11/21/2013   CLINICAL DATA:  Chest pain and shortness of breath.  EXAM: CHEST  2 VIEW  COMPARISON:  Chest radiograph Sep 24, 2012  FINDINGS: Cardiomediastinal silhouette is nonsuspicious, mildly calcified aortic knob. Left apical bullous changes, pleural thickening and scarring right lung apex. COPD with slightly increasing  right middle lobe airspace opacity. No pleural effusions. No pneumothorax.  Remote left clavicle fracture.  Surgical clips at the GE junction.  IMPRESSION: COPD and bullous changes with increasing strandy densities in right middle lobe which could reflect pneumonia though, recommend followup chest radiograph after treatment to verify improvement.   Electronically Signed   By: Elon Alas   On: 11/21/2013 03:52   Ct Angio Chest Pe W/cm &/or Wo Cm  11/21/2013   CLINICAL DATA:  Assess aorta. Chest pain, shortness of breath. History of VATS.  EXAM: CT ANGIOGRAPHY CHEST WITH CONTRAST  TECHNIQUE: Multidetector CT imaging of the chest was performed using the standard protocol during bolus administration of intravenous contrast. Multiplanar CT image reconstructions and MIPs were obtained to evaluate the vascular anatomy.  CONTRAST:  162mL OMNIPAQUE IOHEXOL 350 MG/ML SOLN  COMPARISON:  Chest radiograph November 21, 2013 and CT of the chest April 01, 2012  FINDINGS: Noncontrast imaging of aorta shows homogeneous density, mild to moderate calcific atherosclerosis. Ascending aorta is normal in course caliber. Moderate calcific atherosclerosis of the carotid bulb with eccentric intimal thickening propagating into the origin of the left subclavian artery, measuring up to 6 mm, narrowing the true lumen by less than 50%. Descending aorta is normal in course and caliber, mild luminal regularity data intimal thickening and calcific atherosclerosis. No dissection, aneurysm, contrast extravasation, periaortic fluid collections. Diffuse bronchial wall thickening. Though not tailored for evaluation, no central pulmonary arterial filling defects.  Severe bilateral apical bullous changes in a background of severe centrilobular emphysema. Large right lung base bulla with superimposed right lower lobe hypoenhancing consolidation, axial 62/93. No pleural effusions. Right apical pleural thickening and scarring. No pneumothorax.   Tracheobronchial tree is patent and midline. No lymphadenopathy by CT size criteria. Thoracic esophagus is unremarkable. Included view of the abdomen is nonsuspicious. 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  Stable 15 mm dominant thyroid lesion at the level of the isthmus. Mild degenerative change of the thoracic  spine, scattered Schmorl's nodes.  Review of the MIP images confirms the above findings.  IMPRESSION: No acute vascular process, specifically no aortic dissection.  Peribronchial wall thickening which likely reflect bronchitis. Severe emphysema with similar right lung base presumed atelectasis and right apical pleural thickening/scarring.  Stable 15 mm dominant isthmus thyroid lesion for which follow-up ultrasound is recommended on a nonemergent basis.   Electronically Signed   By: Elon Alas   On: 11/21/2013 06:37   Dg Chest Port 1 View  11/24/2013   CLINICAL DATA:  Shortness of breath.  Hypotension.  EXAM: PORTABLE CHEST - 1 VIEW  COMPARISON:  11/21/2013  FINDINGS: Bullous emphysema again demonstrated. No evidence of pneumothorax. No evidence of pulmonary consolidation or pleural effusion. Heart size is normal. No mass or lymphadenopathy identified. Surgical clips again seen in the epigastric region. Old left clavicle fracture deformity again noted.  IMPRESSION: Bullous emphysema.  No acute findings.   Electronically Signed   By: Earle Gell M.D.   On: 11/24/2013 14:16    Microbiology: Recent Results (from the past 240 hour(s))  MRSA PCR SCREENING     Status: None   Collection Time    11/21/13 12:17 PM      Result Value Ref Range Status   MRSA by PCR NEGATIVE  NEGATIVE Final   Comment:            The GeneXpert MRSA Assay (FDA     approved for NASAL specimens     only), is one component of a     comprehensive MRSA colonization     surveillance program. It is not     intended to diagnose MRSA     infection nor to guide or     monitor treatment for     MRSA  infections.  CULTURE, BLOOD (ROUTINE X 2)     Status: None   Collection Time    11/21/13 12:53 PM      Result Value Ref Range Status   Specimen Description BLOOD RIGHT ANTECUBITAL   Final   Special Requests BOTTLES DRAWN AEROBIC AND ANAEROBIC 10CC   Final   Culture  Setup Time     Final   Value: 11/21/2013 17:36     Performed at [facility]   Culture     Final   Value:        BLOOD CULTURE RECEIVED NO GROWTH TO DATE CULTURE WILL BE HELD FOR 5 DAYS BEFORE ISSUING A FINAL NEGATIVE REPORT     Performed at [facility]   Report Status PENDING   Incomplete  CULTURE, BLOOD (ROUTINE X 2)     Status: None   Collection Time    11/21/13  1:05 PM      Result Value Ref Range Status   Specimen Description BLOOD RIGHT ANTECUBITAL   Final   Special Requests BOTTLES DRAWN AEROBIC ONLY 6CC   Final   Culture  Setup Time     Final   Value: 11/21/2013 17:36     Performed at [facility]   Culture     Final   Value:        BLOOD CULTURE RECEIVED NO GROWTH TO DATE CULTURE WILL BE HELD FOR 5 DAYS BEFORE ISSUING A FINAL NEGATIVE REPORT     Performed at [facility]   Report Status PENDING   Incomplete     Labs: Basic Metabolic Panel:  Recent Labs Lab 11/21/13 0323 11/21/13 1305 11/22/13 0052 11/23/13 0310 11/25/13  0340  NA 135*  --  138 137 139  K 4.3  --  4.3 3.9 3.7  CL 97  --  92* 94* 96  CO2  --   --  34* 31 34*  GLUCOSE 94  --  121* 114* 98  BUN 11  --  16 25* 26*  CREATININE 1.00  --  0.92 0.75 0.74  CALCIUM  --   --  9.2 8.9 9.2  MG  --  2.0  --   --   --   PHOS  --  2.8  --   --   --    Liver Function Tests:  Recent Labs Lab 11/22/13 0052 11/25/13 0340  AST 38* 32  ALT 20 23  ALKPHOS 122* 100  BILITOT 0.3 0.5  PROT 7.8 6.9  ALBUMIN 3.5 3.0*   No results found for this basename: LIPASE, AMYLASE,  in the last 168 hours No results found for this basename: AMMONIA,  in the last 168 hours CBC:  Recent Labs Lab 11/21/13 0313   11/22/13 0052 11/23/13 0310 11/24/13 0300 11/25/13 0340 11/26/13 0549  WBC 12.0*  --  16.1* 21.2* 15.5* 15.0* 21.5*  NEUTROABS 5.1  --   --   --   --   --   --   HGB 12.7*  < > 13.3 12.2* 13.0 13.3 13.7  HCT 38.5*  < > 41.6 37.9* 41.4 41.8 42.6  MCV 86.7  --  86.7 85.6 89.2 87.4 86.8  PLT 358  --  335 352 352 358 380  < > = values in this interval not displayed. Cardiac Enzymes:  Recent Labs Lab 11/21/13 1305 11/21/13 1850 11/22/13 0052 11/26/13 0642 11/26/13 1128  TROPONINI 0.71* 1.10* 2.05* 0.42* <0.30   BNP: BNP (last 3 results) No results found for this basename: PROBNP,  in the last 8760 hours CBG: No results found for this basename: GLUCAP,  in the last 168 hours     Signed:  Debbe Odea, MD [facility] 11/26/2013, 2:42 PM

## 2013-11-26 NOTE — Progress Notes (Signed)
Follow-up:  Notified by RN at approx 0230 that pt c/o CP. EKG was done and noted NSR w/ some ST depression in lateral leads comparable to previous EKG. There was no report of nausea, dizziness or diaphoresis. Pt was given Morphine and was requesting something for indigestion. GI Cocktail ordered and requested RN notify cardiology who has been following pt since admission. RN notifies me again at approx 0530 to report pt still c/o CP which worsens w/ deep breathing and did not improve w/ Morphine,  GI Cocktail or SL NTG x 1.  His VS have remained stable through-out the night and there has been no respiratory distress. Dr Aundra Dubin was notified earlier and requested hospitalists be notified if pt continues to report CP. CXR and Troponin pending. Assessment/Plan: 1. Chest pain: Refractory to Morphine, GI Cocktail and SL NTG. EKG w/o acute changes. Cardiology aware. VSS. Sounds pleuritic in nature though given pt's history some concern for cardiac source. Some suspicion for malingering as RN reports pt reluctant to return home. Follow troponin and CXR and defer further changes in pt's plan of care to primary/cardiology teams this am. Discussed pt w/ Dr Hal Hope who has also reviewed EKG and is agreement w/ plan.   Jeryl Columbia, NP-C [facility] Pager [phone]

## 2013-11-26 NOTE — Progress Notes (Signed)
Subjective: Still with 3/10 pain.    Objective: Vital signs in last 24 hours: Temp:  [97.8 F (36.6 C)-98.9 F (37.2 C)] 97.9 F (36.6 C) (07/21 0517) Pulse Rate:  [85-99] 85 (07/21 0517) Resp:  [16-20] 20 (07/21 0517) BP: (95-141)/(51-77) 115/77 mmHg (07/21 0517) SpO2:  [96 %-100 %] 100 % (07/21 0517) FiO2 (%):  [32 %] 32 % (07/20 1438) Weight:  [114 lb 6.4 oz (51.891 kg)] 114 lb 6.4 oz (51.891 kg) (07/21 2993) Weight change: -2 lb 6.4 oz (-1.089 kg) Last BM Date: 11/25/13 Intake/Output from previous day: +153 (-3560 since admit) wt 114.6 up from wt of 108 07/20 0701 - 07/21 0700 In: 1303 [P.O.:1300; I.V.:3] Out: 1150 [Urine:1150] Intake/Output this shift:    PE: General:Pleasant affect, NAD Skin:Warm and dry, brisk capillary refill HEENT:normocephalic, sclera clear, mucus membranes moist Neck:supple, no JVD, no bruits  Heart:S1S2 RRR without murmur, gallup, rub or click Lungs:clear without rales, rhonchi, or wheezes ZJI:RCVE, non tender, + BS, do not palpate liver spleen or masses Ext:no lower ext edema, 2+ pedal pulses, 2+ radial pulses Neuro:alert and oriented, MAE, follows commands, + facial symmetry   Lab Results:  Recent Labs  11/25/13 0340 11/26/13 0549  WBC 15.0* 21.5*  HGB 13.3 13.7  HCT 41.8 42.6  PLT 358 380   BMET  Recent Labs  11/25/13 0340  NA 139  K 3.7  CL 96  CO2 34*  GLUCOSE 98  BUN 26*  CREATININE 0.74  CALCIUM 9.2    Recent Labs  11/26/13 0642  TROPONINI 0.42*    Lab Results  Component Value Date   CHOL 230* 11/21/2013   HDL 62 11/21/2013   LDLCALC 159* 11/21/2013   TRIG 43 11/21/2013   CHOLHDL 3.7 11/21/2013   No results found for this basename: HGBA1C     Lab Results  Component Value Date   TSH 0.747 11/21/2013    Hepatic Function Panel  Recent Labs  11/25/13 0340  PROT 6.9  ALBUMIN 3.0*  AST 32  ALT 23  ALKPHOS 100  BILITOT 0.5   No results found for this basename: CHOL,  in the last 72  hours No results found for this basename: PROTIME,  in the last 72 hours     Studies/Results: Dg Chest 2 View  11/26/2013   CLINICAL DATA:  Chest pain with pleuritic component  EXAM: CHEST  2 VIEW  COMPARISON:  Portable chest x-ray of November 24, 2013  FINDINGS: The lungs are mildly hyperinflated at exhibit bullous changes. Apical pleural thickening on the right asymmetric with that on the left is present and stable. The interstitial markings in the mid lung zones bilaterally are prominent but stable. There is no pneumothorax. The heart and pulmonary vascularity are unremarkable. There is old deformity of the left third and fourth ribs posteriorly.  IMPRESSION: Again demonstrated are bullous emphysematous changes. There has not been significant interval change in the appearance of the chest since the previous study.   Electronically Signed   By: David  Coleman   On: 11/26/2013 08:08   Dg Chest Port 1 View  11/24/2013   CLINICAL DATA:  Shortness of breath.  Hypotension.  EXAM: PORTABLE CHEST - 1 VIEW  COMPARISON:  11/21/2013  FINDINGS: Bullous emphysema again demonstrated. No evidence of pneumothorax. No evidence of pulmonary consolidation or pleural effusion. Heart size is normal. No mass or lymphadenopathy identified. Surgical clips again seen in the epigastric region. Old left clavicle fracture  deformity again noted.  IMPRESSION: Bullous emphysema.  No acute findings.   Electronically Signed   By: Earle Gell M.D.   On: 11/24/2013 14:16   ECHO: Left ventricle: The cavity size was normal. There was mild focal basal hypertrophy of the septum. Systolic function was normal. The estimated ejection fraction was in the range of 60% to 65%. Wall motion was normal; there were no regional wall motion abnormalities. Doppler parameters are consistent with abnormal left ventricular relaxation (grade 1 diastolic dysfunction). Doppler parameters are consistent with high ventricular filling pressure. - Mitral  valve: Calcified annulus. - Atrial septum: Echo contrast study showed no right-to-left atrial level shunt, at baseline or with provocation. Impressions: - Compared to the prior study, there has been no significant interval change.     Medications: I have reviewed the patient's current medications. Scheduled Meds: . aspirin  81 mg Oral Daily  . atorvastatin  80 mg Oral Daily  . azithromycin  500 mg Oral Daily  . budesonide-formoterol  2 puff Inhalation BID  . folic acid  1 mg Oral Daily  . furosemide  60 mg Oral Daily  . ipratropium-albuterol  3 mL Nebulization TID  . metoprolol tartrate  25 mg Oral BID  . multivitamin with minerals  1 tablet Oral Daily  . pantoprazole  40 mg Oral Daily  . sodium chloride  3 mL Intravenous Q12H  . thiamine  100 mg Oral Daily  . ticagrelor  90 mg Oral BID  . tiotropium  18 mcg Inhalation Daily   Continuous Infusions:  PRN Meds:.sodium chloride, acetaminophen, levalbuterol, morphine injection, ondansetron (ZOFRAN) IV, sodium chloride  Assessment/Plan: Principal Problem:   NSTEMI (non-ST elevated myocardial infarction)- pk troponin 2.05, recurrent chest pain this AM, EKG with SR ant lat ischemia though not as severe.      s/p  Successful PCI of the mid left anterior descending artery with a 3.0 x 38 mm Promus drug-eluting stent, postdilated to 3.6 mm in diameter.s/p successful balloon angioplasty of the mid left circumflex artery with a 2.0 x 12 balloon. There is a residual 50% stenosis in the mid circumflex. Due to the distal left main disease and ostial circumflex disease, we were unable to pass a stent to the mid circumflex. Procedure stopped after angioplasty to avoid causing any trauma to the distal left main. Will resume tele now, recheck troponin at 11:30.  Pain down to 2-3. Increase protonix to BID and add Imdur- if BP allows.    Active Problems:   COPD with emphysema- no change in XRAY today   Tobacco abuse   CAD in native artery, Lt main  and LCX with residual disease   Systolic CHF, acute- with normal EF, due to ischemia in combination with diastolic dysfunction    Hyperlipidemia with LDL 159-now on lipitor 80-pre hospital on 40 mg    Leukocytosis at 21 today, has been up and down      Hodgkin's disease  LOS: 5 days   Time spent with pt. :15 minutes. [facility] R  Nurse Practitioner Certified Pager [phone] or after 5pm and on weekends call [phone] 11/26/2013, 8:38 AM  Patient seen and examined and history reviewed. Agree with above findings and plan. Patient had severe chest pain last night. Complained of burning in chest with heavy weight sitting on chest and radiating down left arm. Troponin 0.42 today. Not sure if this is coming down from before or new elevation. Will follow trend. Nitrates stopped yesterday due to hypotension. Will try Imdur  15 mg daily. Will also add Ranexa. Pain may be related to residual ostial LCX disease. Not amenable to PCI. Will try and optimize medical therapy.  Philip Coleman, [place] 11/26/2013 10:50 AM

## 2013-11-27 ENCOUNTER — Other Ambulatory Visit: Payer: Self-pay | Admitting: Emergency Medicine

## 2013-11-27 ENCOUNTER — Telehealth (HOSPITAL_COMMUNITY): Payer: Self-pay

## 2013-11-27 LAB — CULTURE, BLOOD (ROUTINE X 2)
Culture: NO GROWTH
Culture: NO GROWTH

## 2013-12-03 ENCOUNTER — Telehealth: Payer: Self-pay | Admitting: Interventional Cardiology

## 2013-12-03 NOTE — Telephone Encounter (Signed)
New problem    Per Pt can not stand for long.   Pt feels dizzy and may fall pt thinks his BP is off.   Pt wants to know what he should do?

## 2013-12-03 NOTE — Telephone Encounter (Signed)
Spoke with pt got up at went get some water and he felt dizzy like he might pass out. Pt went to lie down and he is currently feeling better.  Pt did admit to having a few minor episodes since he has been home from the hospital, but nothing like the episode he had this morning. Pt states he had an episode like this in the hospital and they told him that his BP dropped. Pt hasnt eaten today, but he has taken his medications. He lives alone and it worries him when he feels like this. Pt doesn't have a BP machine at home to check his BP.

## 2013-12-03 NOTE — Telephone Encounter (Signed)
Spoke with Dr. Burt Knack DOD and he recommends pt increase fluids and make sure he is eating. Pt should see Dr. Irish Lack in clinic tomorrow and have orthostatics.

## 2013-12-03 NOTE — Telephone Encounter (Signed)
Spoke with pt and gave him appt for 3:30 pm tomorrow. Pt will try to get someone to bring him tomorrow. He will call back and let me know. If symptoms worsen he will go to ED.

## 2013-12-04 ENCOUNTER — Ambulatory Visit: Payer: Medicare Other | Admitting: Interventional Cardiology

## 2013-12-04 ENCOUNTER — Ambulatory Visit: Payer: Medicare Other | Admitting: Internal Medicine

## 2013-12-04 NOTE — Telephone Encounter (Signed)
Spoke with pt and he states he cant make appt today because he doesn't have transportation. Pt states he feels much better today. Pt has f/u with PCP next week. His next follow up with Dr. Irish Lack is 01/17/14. Pt states if we need to see him sooner he will need to know two weeks in advance because of transportation. Pt told me if he started to feel bad again then he would just go to the ED.

## 2013-12-11 ENCOUNTER — Ambulatory Visit: Payer: Medicare Other | Admitting: Internal Medicine

## 2013-12-12 ENCOUNTER — Other Ambulatory Visit: Payer: Self-pay | Admitting: *Deleted

## 2013-12-12 MED ORDER — TRAMADOL HCL 50 MG PO TABS: ORAL_TABLET | ORAL | Status: DC

## 2013-12-12 NOTE — Telephone Encounter (Signed)
[employer]

## 2013-12-17 ENCOUNTER — Other Ambulatory Visit: Payer: Self-pay | Admitting: *Deleted

## 2013-12-17 MED ORDER — TRAMADOL HCL 50 MG PO TABS: ORAL_TABLET | ORAL | Status: DC

## 2013-12-17 NOTE — Telephone Encounter (Signed)
Rx was written on 8/6 for only # 60 and was suppose to be #120 for 30 day supply. Called the [employer] and corrected. Patient Notified.

## 2013-12-18 ENCOUNTER — Ambulatory Visit: Payer: Medicare Other | Admitting: Nurse Practitioner

## 2013-12-19 ENCOUNTER — Ambulatory Visit: Payer: Medicare Other | Admitting: Nurse Practitioner

## 2013-12-27 ENCOUNTER — Other Ambulatory Visit (HOSPITAL_COMMUNITY): Payer: Self-pay | Admitting: Internal Medicine

## 2014-01-09 ENCOUNTER — Other Ambulatory Visit: Payer: Self-pay

## 2014-01-09 MED ORDER — METOPROLOL TARTRATE 25 MG PO TABS: 25.0000 mg | ORAL_TABLET | Freq: Two times a day (BID) | ORAL | Status: DC

## 2014-01-10 ENCOUNTER — Other Ambulatory Visit: Payer: Self-pay

## 2014-01-10 ENCOUNTER — Other Ambulatory Visit: Payer: Self-pay | Admitting: *Deleted

## 2014-01-10 MED ORDER — METOPROLOL TARTRATE 25 MG PO TABS: 25.0000 mg | ORAL_TABLET | Freq: Two times a day (BID) | ORAL | Status: DC

## 2014-01-15 ENCOUNTER — Other Ambulatory Visit: Payer: Self-pay | Admitting: *Deleted

## 2014-01-15 MED ORDER — TRAMADOL HCL 50 MG PO TABS: ORAL_TABLET | ORAL | Status: DC

## 2014-01-16 ENCOUNTER — Ambulatory Visit (INDEPENDENT_AMBULATORY_CARE_PROVIDER_SITE_OTHER): Payer: Medicare Other | Admitting: Nurse Practitioner

## 2014-01-16 ENCOUNTER — Encounter: Payer: Self-pay | Admitting: Nurse Practitioner

## 2014-01-16 VITALS — BP 140/82 | HR 83 | Temp 98.8°F | Resp 10 | Ht 68.0 in | Wt 108.0 lb

## 2014-01-16 DIAGNOSIS — F17200 Nicotine dependence, unspecified, uncomplicated: Secondary | ICD-10-CM

## 2014-01-16 DIAGNOSIS — F1010 Alcohol abuse, uncomplicated: Secondary | ICD-10-CM

## 2014-01-16 DIAGNOSIS — Z720 Tobacco use: Secondary | ICD-10-CM

## 2014-01-16 DIAGNOSIS — Z23 Encounter for immunization: Secondary | ICD-10-CM

## 2014-01-16 DIAGNOSIS — I214 Non-ST elevation (NSTEMI) myocardial infarction: Secondary | ICD-10-CM

## 2014-01-16 DIAGNOSIS — I200 Unstable angina: Secondary | ICD-10-CM

## 2014-01-16 DIAGNOSIS — J438 Other emphysema: Secondary | ICD-10-CM

## 2014-01-16 DIAGNOSIS — J439 Emphysema, unspecified: Secondary | ICD-10-CM

## 2014-01-16 NOTE — Progress Notes (Signed)
Patient ID: Philip Coleman, male   DOB: 1956/03/06, 58 y.o.   MRN: [id number]  Chief Complaint  Patient presents with  . Medical Management of Chronic Issues    3 month follow-up   . Form Completion    Complete form for back brace    Allergies  Allergen Reactions  . Codeine Hives and Nausea And Vomiting   HPI 58 y.o. male patient is seen in the office today for routine follow up.was hospitalized for NSTEMI since last visit. Reports chest pain is stable. disappointed he can no longer take Viagra. Aware of risk involved and why he can no longer be on medications. Continues to follow up with cardiology, is complaint with medications. Follows with pulmonology due to advanced COPD and orthopedic due to shoulder pain.  Requesting a back brace for ongoing back pain. No history of injury to back. Reports worked [employer] and has had low back and tailbone pain for many years. Rates pain at a 10/10 at its worst, pain is brought on by prolonged sitting. Pain does not radiate. Worse when he is still down but when he stands up it quickly goes away and is relieved PRN tramadol if changes in position does not help. by  Continues to smoke. On o2 by nasal canula. No chest pain or dyspnea reported, no other complaints.  Review of Systems:   Constitutional: Negative for fever, chills and malaise/fatigue. Weight loss present. Respiratory: chronic cough, breathing comfortable at rest Cardiovascular: Negative for chest pain and palpitations.   Gastrointestinal: Negative for heartburn, nausea, vomiting, abdominal pain, diarrhea and constipation.   Genitourinary: Negative for dysuria, urgency and frequency.   Musculoskeletal: Low back pain Skin: Negative.    Neurological: Negative for dizziness, weakness and headaches.   Psychiatric/Behavioral: Negative for depression. The patient does not have insomnia.      Past Medical History  Diagnosis Date  . Thyroid disease     hyper, not on meds  . COPD (chronic  obstructive pulmonary disease)   . Hodgkin's disease 1993    with abdominal surgical scar -pt unable to describe   . Spontaneous pneumothorax     left  . Spontaneous pneumothorax     right  . Pneumonia   . Chronic respiratory failure   . Anxiety state, unspecified   . Depressive disorder, not elsewhere classified   . Chronic airway obstruction, not elsewhere classified   . Unspecified constipation   . Lumbago   . Other malaise and fatigue   . Headache(784.0)   . Other dyspnea and respiratory abnormality   . Cough   . Diarrhea   . Weight loss   . NSTEMI (non-ST elevated myocardial infarction)    Past Surgical History  Procedure Laterality Date  . Splenectomy  1987  . Chest tube insertion      left  . Chest tube insertion      right  . Teeth extracted for dentures  2012   Medication reviewed.    Medication List       This list is accurate as of: 01/16/14  4:31 PM.  Always use your most recent med list.               albuterol 108 (90 BASE) MCG/ACT inhaler  Commonly known as:  PROAIR HFA  Inhale 2 puffs into the lungs every 6 (six) hours as needed for wheezing or shortness of breath.     albuterol (2.5 MG/3ML) 0.083% nebulizer solution  Commonly known as:  PROVENTIL  Take 3 mLs (2.5 mg total) by nebulization every 6 (six) hours as needed for wheezing or shortness of breath.     aspirin 81 MG chewable tablet  Chew 1 tablet (81 mg total) by mouth daily.     atorvastatin 40 MG tablet  Commonly known as:  LIPITOR  Take 40 mg by mouth daily.     budesonide-formoterol 160-4.5 MCG/ACT inhaler  Commonly known as:  SYMBICORT  Inhale 2 puffs into the lungs 2 (two) times daily.     furosemide 20 MG tablet  Commonly known as:  LASIX  Take 3 tablets (60 mg total) by mouth daily.     isosorbide mononitrate 15 mg Tb24 24 hr tablet  Commonly known as:  IMDUR  Take 0.5 tablets (15 mg total) by mouth daily.     metoprolol tartrate 25 MG tablet  Commonly known as:   LOPRESSOR  Take 1 tablet (25 mg total) by mouth 2 (two) times daily.     omeprazole 20 MG capsule  Commonly known as:  PRILOSEC  Take 2 capsules (40 mg total) by mouth daily.     ranolazine 500 MG 12 hr tablet  Commonly known as:  RANEXA  Take 1 tablet (500 mg total) by mouth 2 (two) times daily.     ticagrelor 90 MG Tabs tablet  Commonly known as:  BRILINTA  Take 1 tablet (90 mg total) by mouth 2 (two) times daily.     tiotropium 18 MCG inhalation capsule  Commonly known as:  SPIRIVA  Place 18 mcg into inhaler and inhale daily.     traMADol 50 MG tablet  Commonly known as:  ULTRAM  Take two tablets by mouth every 12 hours as needed for pain         Physical exam BP 140/82  Pulse 83  Temp(Src) 98.8 F (37.1 C) (Oral)  Resp 10  Ht 5\' 8"  (1.727 m)  Wt 108 lb (48.988 kg)  BMI 16.42 kg/m2  SpO2 99%  Constitutional: He is oriented to person, place, and time and well-developed, appears malnourished, and in no distress.   HENT:   Head: Normocephalic and atraumatic.  Nose: Nose normal.   Mouth/Throat: Oropharynx is clear and moist. No oropharyngeal exudate.   Eyes: Conjunctivae and EOM are normal. Pupils are equal, round, and reactive to light.   Neck: Normal range of motion. Neck supple. No thyromegaly present.   Cardiovascular: Normal rate, regular rhythm and normal heart sounds.    Pulmonary/Chest: Effort normal and breath sounds normal. No respiratory distress. No wheezes. On o2 by nasal canula Abdominal: Soft. Bowel sounds are normal. He exhibits no distension.  Musculoskeletal: Normal range of motion. He exhibits no edema and no tenderness.  Lymphadenopathy:    He has no cervical adenopathy.  Neurological: He is alert and oriented to person, place, and time.   Skin: Skin is warm and dry. He is not diaphoretic.  Psychiatric: Affect normal.   Labs- Lipid Panel     Component Value Date/Time   CHOL 230* 11/21/2013 1305   TRIG 43 11/21/2013 1305   HDL 62 11/21/2013  1305   HDL 53 09/09/2013 1357   CHOLHDL 3.7 11/21/2013 1305   CHOLHDL 3.8 09/09/2013 1357   VLDL 9 11/21/2013 1305   LDLCALC 159* 11/21/2013 1305   LDLCALC 120* 09/09/2013 1357    Assessment/plan  1. Lumbago - chronic, continue Tramadol as ordered - DME order for back brace not medically necessary   2. Pulmonary emphysema, unspecified emphysema  type -Continue symbicort, spiriva and prn albuterol  - Continue o2 by nasal canula - Encouraged tobacco cessation  3. NSTEMI (non-ST elevated myocardial infarction) - has appointment with Cardiology 02/11/14  4. Tobacco abuse - has reduced to 1/2 ppd -counselled about cessation of smoking  5. Weight loss - Carnation recommended to supplement, pt reports cannot afford Ensure

## 2014-01-16 NOTE — Patient Instructions (Signed)
Cut back on cigarettes   Check out [facility] for supplement   Follow up in 3-4 months

## 2014-01-17 ENCOUNTER — Ambulatory Visit: Payer: Medicare Other | Admitting: Interventional Cardiology

## 2014-01-17 ENCOUNTER — Telehealth: Payer: Self-pay | Admitting: Pulmonary Disease

## 2014-01-17 NOTE — Telephone Encounter (Signed)
Called pt NA.  Pt is due for OV w/ VS

## 2014-01-20 NOTE — Telephone Encounter (Signed)
lmomtcb x 1   Pt is due for OV

## 2014-01-21 MED ORDER — BUDESONIDE-FORMOTEROL FUMARATE 160-4.5 MCG/ACT IN AERO
2.0000 | INHALATION_SPRAY | Freq: Two times a day (BID) | RESPIRATORY_TRACT | Status: DC
Start: 2014-01-21 — End: 2014-03-20

## 2014-01-21 NOTE — Telephone Encounter (Signed)
Called, spoke with pt.  Follow up appt scheduled wth VS for Oct 22 at 3:45 pm.  Pt confirmed appt and requesting reminder card to be mailed to verified home address.  This has been done and symbicort rx sent to pharm. Pt aware and voiced no further questions or concerns at this time.

## 2014-01-31 ENCOUNTER — Other Ambulatory Visit: Payer: Self-pay | Admitting: Cardiovascular Disease

## 2014-02-10 ENCOUNTER — Other Ambulatory Visit: Payer: Self-pay | Admitting: Internal Medicine

## 2014-02-11 ENCOUNTER — Other Ambulatory Visit: Payer: Self-pay | Admitting: Pulmonary Disease

## 2014-02-11 ENCOUNTER — Ambulatory Visit: Payer: Medicare Other | Admitting: Interventional Cardiology

## 2014-02-13 ENCOUNTER — Other Ambulatory Visit: Payer: Self-pay | Admitting: *Deleted

## 2014-02-13 ENCOUNTER — Telehealth: Payer: Self-pay | Admitting: *Deleted

## 2014-02-13 MED ORDER — TICAGRELOR 90 MG PO TABS: 90.0000 mg | ORAL_TABLET | Freq: Two times a day (BID) | ORAL | Status: DC

## 2014-02-13 NOTE — Telephone Encounter (Signed)
Received [place] Form for patient From [facility] # [phone] Filled out and gave to Hassell Done to sign and fax back Fax # [phone]

## 2014-02-17 ENCOUNTER — Other Ambulatory Visit: Payer: Self-pay | Admitting: Internal Medicine

## 2014-02-17 ENCOUNTER — Other Ambulatory Visit: Payer: Self-pay | Admitting: Nurse Practitioner

## 2014-02-27 ENCOUNTER — Ambulatory Visit: Payer: Medicare Other | Admitting: Pulmonary Disease

## 2014-03-06 ENCOUNTER — Encounter: Payer: Self-pay | Admitting: Interventional Cardiology

## 2014-03-06 ENCOUNTER — Ambulatory Visit (INDEPENDENT_AMBULATORY_CARE_PROVIDER_SITE_OTHER): Payer: Medicare Other | Admitting: Interventional Cardiology

## 2014-03-06 VITALS — BP 80/60 | HR 80 | Ht 68.0 in | Wt 108.0 lb

## 2014-03-06 DIAGNOSIS — E785 Hyperlipidemia, unspecified: Secondary | ICD-10-CM

## 2014-03-06 DIAGNOSIS — I2510 Atherosclerotic heart disease of native coronary artery without angina pectoris: Secondary | ICD-10-CM

## 2014-03-06 DIAGNOSIS — I951 Orthostatic hypotension: Secondary | ICD-10-CM

## 2014-03-06 DIAGNOSIS — I200 Unstable angina: Secondary | ICD-10-CM

## 2014-03-06 DIAGNOSIS — Z720 Tobacco use: Secondary | ICD-10-CM

## 2014-03-06 MED ORDER — CLOPIDOGREL BISULFATE 75 MG PO TABS: 75.0000 mg | ORAL_TABLET | Freq: Every day | ORAL | Status: DC

## 2014-03-06 MED ORDER — PANTOPRAZOLE SODIUM 40 MG PO TBEC: 40.0000 mg | DELAYED_RELEASE_TABLET | Freq: Every day | ORAL | Status: AC

## 2014-03-06 NOTE — Progress Notes (Signed)
Patient ID: Philip Coleman, male   DOB: February 28, 1956, 58 y.o.   MRN: [id number]    [place], [place], [place]  [address] Phone: [phone] Fax:  [phone]  Date:  03/06/2014   ID:  Philip Coleman 09-04-1955, MRN [id number]  PCP:  Lauree Chandler, NP      History of Present Illness: Philip Coleman is a 58 y.o. male who had 2 vessel stenting in July 2015.  He had one episode of chest discomfort several weeks after this. It lasted less than an hour. It resolves spontaneously. Since that time, he has not had any further discomfort. He continues to smoke. He wants this, off of some medications. His medicines are costing him quite a bit. The Brilinta is over $200 a month. He also reports that he forgets the evening dose of Brilinta fairly frequently.  When he walks, he has shortness of breath. He does not have any of the same chest discomfort which prompted his hospital admission in July 2015. He continues to wear home oxygen for his severe lung disease. Blood pressures and his primary care doctor's office have been normal. He does report that he does not drink a lot of water. He does drink a lot of soda.   Wt Readings from Last 3 Encounters:  03/06/14 108 lb (48.988 kg)  01/16/14 108 lb (48.988 kg)  11/26/13 114 lb 6.4 oz (51.891 kg)     Past Medical History  Diagnosis Date  . Thyroid disease     hyper, not on meds  . COPD (chronic obstructive pulmonary disease)   . Hodgkin's disease 1993    with abdominal surgical scar -pt unable to describe   . Spontaneous pneumothorax     left  . Spontaneous pneumothorax     right  . Pneumonia   . Chronic respiratory failure   . Anxiety state, unspecified   . Depressive disorder, not elsewhere classified   . Chronic airway obstruction, not elsewhere classified   . Unspecified constipation   . Lumbago   . Other malaise and fatigue   . Headache(784.0)   . Other dyspnea and respiratory abnormality   . Cough   .  Diarrhea   . Weight loss   . NSTEMI (non-ST elevated myocardial infarction)     Current Outpatient Prescriptions  Medication Sig Dispense Refill  . albuterol (PROVENTIL) (2.5 MG/3ML) 0.083% nebulizer solution Take 3 mLs (2.5 mg total) by nebulization every 6 (six) hours as needed for wheezing or shortness of breath.  120 vial  5  . aspirin 81 MG chewable tablet Chew 1 tablet (81 mg total) by mouth daily.      [name] atorvastatin (LIPITOR) 40 MG tablet Take 40 mg by mouth daily.      . budesonide-formoterol (SYMBICORT) 160-4.5 MCG/ACT inhaler Inhale 2 puffs into the lungs 2 (two) times daily.  1 Inhaler  1  . furosemide (LASIX) 20 MG tablet Take 3 tablets (60 mg total) by mouth daily.  30 tablet  0  . isosorbide mononitrate (IMDUR) 15 mg TB24 24 hr tablet Take 7.5 mg by mouth daily. Take 1/2 tab      . metoprolol tartrate (LOPRESSOR) 25 MG tablet TAKE 1 TABLET (25 MG TOTAL) BY MOUTH TWO   TIMES DAILY.  30 tablet  0  . omeprazole (PRILOSEC) 20 MG capsule Take 2 capsules (40 mg total) by mouth daily.  30 capsule  0  . PROAIR HFA 108 (90 BASE)  MCG/ACT inhaler INHALE TWO   PUFFS INTO THE LUNGS EVERY 6 (SIX) HOURS AS NEEDED FOR WHEEZING OR SHORTNESS OF BREATH.  1 Inhaler  3  . ranolazine (RANEXA) 500 MG 12 hr tablet Take 1 tablet (500 mg total) by mouth 2 (two) times daily.  60 tablet  0  . ticagrelor (BRILINTA) 90 MG TABS tablet Take 1 tablet (90 mg total) by mouth 2 (two) times daily.  60 tablet  0  . tiotropium (SPIRIVA) 18 MCG inhalation capsule Place 18 mcg into inhaler and inhale daily.      . traMADol (ULTRAM) 50 MG tablet TAKE TWO   TABLETS BY MOUTH EVERY 12 HOURS AS NEEDED FOR PAIN  120 tablet  0   No current facility-administered medications for this visit.    Allergies:    Allergies  Allergen Reactions  . Codeine Hives and Nausea And Vomiting    Social History:  The patient  reports that he has been smoking Cigarettes.  He has a 10.25 pack-year smoking history. He uses smokeless  tobacco. He reports that he drinks about 3.6 ounces of alcohol per week. He reports that he does not use illicit drugs.   Family History:  The patient's family history includes Cancer in his sister; Cancer (age of onset: 21) in his mother; Diabetes in his mother; Hyperlipidemia in his mother; Pneumonia in his father; Stroke in his sister. There is no history of Heart attack.   ROS:  Please see the history of present illness.  No nausea, vomiting.  No fevers, chills.  No focal weakness.  No dysuria.   All other systems reviewed and negative.   PHYSICAL EXAM: VS:  BP 80/60  Pulse 80  Ht 5\' 8"  (1.727 m)  Wt 108 lb (48.988 kg)  BMI 16.42 kg/m2  SpO2 99% Frail, in no acute distress HEENT: normal Neck: no JVD, no carotid bruits Cardiac:  normal S1, S2; RRR;  Lungs:  clear to auscultation bilaterally, no wheezing, rhonchi or rales Abd: soft, nontender, no hepatomegaly Ext: no edema Skin: warm and dry Neuro:   no focal abnormalities noted      ASSESSMENT AND PLAN:  1. CAD: No angina. Given that he has 2 drug-eluting stents, will change to once a day antiplatelet drug to help with compliance. Stop Brilinta. Start clopidogrel 75 mg daily. He also wants to come off other medications if possible. Given his lack of angina, will stop Ranexa. If he does well without any chest discomfort for a week, he can stop the metoprolol. I am a little concerned about using a beta blocker in him due to his severe lung disease.  If CP returns, we will have to restart some of the antianginal medicines. 2. 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 3. Hyperlipidemia: LDL was well above target in the hospital. He has been on atorvastatin 40 mg. Will check lipid profile. 4. Hypotension: No symptoms of dizziness or near syncope. He has not had syncope. I encouraged him to try to stay better hydrated. He needs to drink plenty of water. He should stay away from sodas  and alcohol. 5. Tobacco abuse: I encouraged him to stop smoking. 6. Change omeprazole to Protonix since we are switching to plavix.   Follow-up in 4 months  Signed, Mina Marble, MD, [facility] 03/06/2014 2:12 PM

## 2014-03-06 NOTE — Patient Instructions (Addendum)
Your physician has recommended you make the following change in your medication:  1. STOP Brilinta 2. START Plavix 75mg  take one by mouth daily 3. STOP Ranexa, if you do not have any chest discomfort after being off this medication for 1 WEEK then you can STOP Metoprolol 4. STOP Prilosec (Omeprazole) 5. START Protonix 40mg  take one by mouth daily  Your physician recommends that you return for a FASTING LIPID profile--nothing to eat or drink after midnight, lab opens at 7:30 AM  Your physician recommends that you schedule a follow-up appointment in: 4 MONTHS with Dr Irish Lack  Your physician discussed the hazards of tobacco use. Tobacco use cessation is recommended and techniques and options to help you quit were discussed.

## 2014-03-14 ENCOUNTER — Other Ambulatory Visit: Payer: Self-pay | Admitting: *Deleted

## 2014-03-14 MED ORDER — TRAMADOL HCL 50 MG PO TABS: ORAL_TABLET | ORAL | Status: DC

## 2014-03-14 NOTE — Telephone Encounter (Signed)
Patient requested and phoned in Rx.

## 2014-03-17 ENCOUNTER — Other Ambulatory Visit: Payer: Self-pay | Admitting: Nurse Practitioner

## 2014-03-17 ENCOUNTER — Other Ambulatory Visit: Payer: Medicare Other

## 2014-03-20 ENCOUNTER — Telehealth: Payer: Self-pay | Admitting: *Deleted

## 2014-03-20 ENCOUNTER — Other Ambulatory Visit: Payer: Self-pay | Admitting: Pulmonary Disease

## 2014-03-20 NOTE — Telephone Encounter (Signed)
Received a fax from [facility] that patient was not taking his Atorvastatin 40mg  on a regular basis. A Medication nonadherence therapy advisory. Tried calling patient to question these and received voicemail. Left message to return call.

## 2014-03-24 NOTE — Telephone Encounter (Signed)
Patient stated that he is taking the medication everyday.

## 2014-03-31 ENCOUNTER — Encounter: Payer: Self-pay | Admitting: Interventional Cardiology

## 2014-04-14 ENCOUNTER — Other Ambulatory Visit: Payer: Self-pay | Admitting: *Deleted

## 2014-04-14 MED ORDER — TRAMADOL HCL 50 MG PO TABS: ORAL_TABLET | ORAL | Status: DC

## 2014-04-14 NOTE — Telephone Encounter (Signed)
Patient called requesting to be phoned into pharmacy

## 2014-04-17 ENCOUNTER — Encounter: Payer: Self-pay | Admitting: Nurse Practitioner

## 2014-04-17 ENCOUNTER — Encounter (HOSPITAL_COMMUNITY): Payer: Self-pay | Admitting: Cardiology

## 2014-04-17 ENCOUNTER — Ambulatory Visit: Payer: Medicare Other | Admitting: Nurse Practitioner

## 2014-04-23 ENCOUNTER — Telehealth: Payer: Self-pay | Admitting: *Deleted

## 2014-04-23 NOTE — Telephone Encounter (Signed)
Received [employer] Form from [facility] # [phone]. Form filled out and given to Janett Billow to review and sign.

## 2014-05-09 ENCOUNTER — Telehealth: Payer: Self-pay | Admitting: Cardiology

## 2014-05-09 NOTE — Telephone Encounter (Signed)
Patient call stating that his nose started bleeding profusely. He is on ASA and Brilinta.  His nose was bleeding yesterday and now has been bleeding all day long.  Instructed patient to go to [facility] ER for evaluation.

## 2014-05-11 NOTE — Telephone Encounter (Signed)
My last note said we switched him to Plavix from Brilinta at the last visit due to cost.  Did this happen?

## 2014-05-12 NOTE — Telephone Encounter (Signed)
I called and spoke with the patient. He did not go to the ER for evaluation of his nosebleeds. He is on plavix, not brilinta. He is also on ASA 81 mg once daily. He states that he has had intermittent nose bleeds for the last 2-3 weeks. He is on continuous home O2 at 3-4 L. He states that if he gets in a hurry and gets winded, he will turn his O2 up to 8 L for a short time. He states he has continued with intermittent nose bleeds, sounds more like clots when he blows his nose vs a run or trickle. He did have a profuse nose bleed on New Year's Day. He denies dizziness/ lightheadedness. He is putting vaseline in his nose to keep it moist. I have advised him to use nasal saline spray. He saw an ENT "years ago" but does recall who this was. I advised the patient I will review with Dr. Irish Lack and notify him of his recommendations. He is agreeable.

## 2014-05-12 NOTE — Telephone Encounter (Signed)
Stop aspirin. COntinue PLavix.  This may help the nose bleeds.

## 2014-05-13 ENCOUNTER — Other Ambulatory Visit: Payer: Self-pay | Admitting: *Deleted

## 2014-05-13 MED ORDER — TRAMADOL HCL 50 MG PO TABS: ORAL_TABLET | ORAL | Status: DC

## 2014-05-13 NOTE — Telephone Encounter (Signed)
Patient Requested and phoned into pharmacy

## 2014-05-13 NOTE — Addendum Note (Signed)
Addended by: Alvis Lemmings C on: 05/13/2014 09:06 AM   Modules accepted: Orders, Medications

## 2014-05-13 NOTE — Telephone Encounter (Signed)
I spoke with the patient and made him aware of Dr. Hassell Done recommendations.

## 2014-05-15 ENCOUNTER — Ambulatory Visit: Payer: Self-pay | Admitting: Nurse Practitioner

## 2014-05-19 ENCOUNTER — Other Ambulatory Visit: Payer: Self-pay | Admitting: Internal Medicine

## 2014-06-05 ENCOUNTER — Other Ambulatory Visit: Payer: Self-pay | Admitting: Nurse Practitioner

## 2014-06-10 ENCOUNTER — Ambulatory Visit: Payer: Medicare Other | Admitting: Interventional Cardiology

## 2014-06-13 ENCOUNTER — Ambulatory Visit: Payer: Medicare Other | Admitting: Interventional Cardiology

## 2014-06-17 ENCOUNTER — Other Ambulatory Visit: Payer: Self-pay | Admitting: Nurse Practitioner

## 2014-06-19 ENCOUNTER — Ambulatory Visit: Payer: Self-pay | Admitting: Nurse Practitioner

## 2014-06-30 ENCOUNTER — Ambulatory Visit: Payer: Medicare Other | Admitting: Pulmonary Disease

## 2014-07-09 ENCOUNTER — Other Ambulatory Visit: Payer: Self-pay

## 2014-07-09 MED ORDER — TRAMADOL HCL 50 MG PO TABS: ORAL_TABLET | ORAL | Status: DC

## 2014-07-09 NOTE — Telephone Encounter (Signed)
Patient called requesting rx for Tramadol to be called in

## 2014-07-29 ENCOUNTER — Telehealth: Payer: Self-pay | Admitting: *Deleted

## 2014-07-29 NOTE — Telephone Encounter (Signed)
Patient called and stated that he needed a Rx for Tramadol called into pharmacy. Rx is not due until 08/09/14. Patient notified and he stated that he thinks he was shorted pills. I told him the last Rx we called in was for 120.

## 2014-07-30 ENCOUNTER — Telehealth: Payer: Self-pay | Admitting: *Deleted

## 2014-07-30 NOTE — Telephone Encounter (Signed)
Spoke with Janett Billow regarding this situation , patient is a [employer] patient which is insurance we do not accommodate here in the office. The patient will call his case worker to get this taken care of so that Janett Billow can further assist him with any medical issues. I informed the patient that he would have to call his orthopedic doctor for further evaluation of his shoulder at this time. The patient stated that he was not happy, and that we should be calling to get this straighten out for him.

## 2014-07-31 ENCOUNTER — Telehealth: Payer: Self-pay | Admitting: *Deleted

## 2014-07-31 NOTE — Telephone Encounter (Signed)
Patient called demanding his medication to be called to pharmacy and stated that his insurance was taken care of and switched from [facility]. Philip Coleman looked up on the site and his insurance has not been changed it is still [employer]. Janett Billow also stated that patient needs an appointment before any refills can be given due to his cancelling and no showing appointments. Informed patient and he said to give him an appointment tomorrow. Informed him that we are closed tomorrow and he became agitated and stated that there are "dieing and sick people out there in the world and we are worried about taking a day off" told patient that was a Holiday called good Friday and a lot of offices are closed. He stated that he guessed he was going to get back on the phone with the insurance company and call us to schedule an appointment on Monday and hung up.

## 2014-08-21 ENCOUNTER — Telehealth: Payer: Self-pay

## 2014-08-21 NOTE — Telephone Encounter (Signed)
Received Form for Spiriva Coverage. Filled out and left for Philip Coleman to review and sign.

## 2014-08-21 NOTE — Telephone Encounter (Signed)
I called [phone] ID# [id number]  I called to initiate PA at given number. I was transferred to [facility] department. After holding for 2 minutes, recording states my call could not be processed, phone hung up.   I called back and requested direct number to PA department, there was no direct number. Call had to be transferred again. Paperwork will be sent via fax.

## 2014-08-22 ENCOUNTER — Other Ambulatory Visit: Payer: Self-pay | Admitting: Nurse Practitioner

## 2014-08-26 NOTE — Telephone Encounter (Signed)
[employer] has DENIED coverage for Spiriva Handihaler.

## 2014-08-27 NOTE — Telephone Encounter (Signed)
What will they cover?

## 2014-09-16 NOTE — Telephone Encounter (Signed)
Tried calling patient and someone pick up and hung up on me. Tried twice.

## 2014-11-28 ENCOUNTER — Other Ambulatory Visit: Payer: Self-pay | Admitting: Internal Medicine

## 2014-12-09 ENCOUNTER — Other Ambulatory Visit: Payer: Self-pay | Admitting: Pulmonary Disease

## 2014-12-25 ENCOUNTER — Other Ambulatory Visit: Payer: Self-pay | Admitting: Pulmonary Disease

## 2014-12-25 ENCOUNTER — Telehealth: Payer: Self-pay | Admitting: Pulmonary Disease

## 2014-12-25 MED ORDER — ALBUTEROL SULFATE HFA 108 (90 BASE) MCG/ACT IN AERS: INHALATION_SPRAY | RESPIRATORY_TRACT | Status: DC

## 2014-12-25 NOTE — Telephone Encounter (Signed)
Called spoke with pt. He needed refill on his albuterol inhaler. He scheduled appt with VS in October. RX sent in. Nothing further needed

## 2015-02-19 ENCOUNTER — Ambulatory Visit (INDEPENDENT_AMBULATORY_CARE_PROVIDER_SITE_OTHER): Payer: Medicare Other | Admitting: Pulmonary Disease

## 2015-02-19 ENCOUNTER — Encounter: Payer: Self-pay | Admitting: Pulmonary Disease

## 2015-02-19 VITALS — BP 108/72 | HR 112 | Temp 100.7°F | Ht 67.0 in | Wt 101.0 lb

## 2015-02-19 DIAGNOSIS — Z23 Encounter for immunization: Secondary | ICD-10-CM

## 2015-02-19 DIAGNOSIS — Z720 Tobacco use: Secondary | ICD-10-CM | POA: Diagnosis not present

## 2015-02-19 DIAGNOSIS — J9611 Chronic respiratory failure with hypoxia: Secondary | ICD-10-CM | POA: Diagnosis not present

## 2015-02-19 DIAGNOSIS — J432 Centrilobular emphysema: Secondary | ICD-10-CM

## 2015-02-19 DIAGNOSIS — R64 Cachexia: Secondary | ICD-10-CM | POA: Diagnosis not present

## 2015-02-19 MED ORDER — IPRATROPIUM-ALBUTEROL 20-100 MCG/ACT IN AERS: 1.0000 | INHALATION_SPRAY | Freq: Four times a day (QID) | RESPIRATORY_TRACT | Status: DC | PRN

## 2015-02-19 MED ORDER — ALBUTEROL SULFATE (2.5 MG/3ML) 0.083% IN NEBU: 2.5000 mg | INHALATION_SOLUTION | Freq: Four times a day (QID) | RESPIRATORY_TRACT | Status: DC | PRN

## 2015-02-19 NOTE — Progress Notes (Signed)
Chief Complaint  Patient presents with  . Follow-up    Pt following for COPD with emphysema. pt states his breathing is terrible, about the same since he has last been here. pt c/o wheezing and prod cough clear in color. no c/o chest tightness.  pt using contiuous O2 at 4LPM and pt states when walking he turns it up to 8LPM. DME: [facility].    History of Present Illness: Philip Coleman is a 59 y.o. male smoker with severe COPD and chronic hypoxic respiratory failure.  He continues to smoke 1/2 to 1 ppd.  He is using 4 to 8 liters oxygen.  He can't keep his weight up.  He has cough, wheeze, yellow sputum.  He denies hemoptysis.  He was in hospital since last visit with NSTEMI s/p stenting.  He ran out of spiriva.  A friend of his gave him combivent >> he likes this.  He has been using symbicort also.  He has trouble carrying the oxygen tanks and wants POC.   TESTS: CT chest 04/01/12>>advanced emphysema, rt apical scarring Echo 04/02/12>>EF 55% Alpha 1 AT 09/24/12 >> 176, MM phenotype PFT 10/18/12 >> FEV1 0.62 (17%), FEV1% 34, TLC 6.46 (98%), RV 2.56 (256%), DLCO 27%, no BD response Echo 11/22/13 >> EF 60 to 77%, grade 1 diastolic dysfx   PMHx >> Hodgkin's disease, PTX, PNA, Anxiety, Depression, Back pain, HA, CAD, HLD, s/p splenectomy  PSHx, Medications, Allergies, Fhx, Shx reviewed.  Physical Exam: BP 108/72 mmHg  Pulse 112  Temp(Src) 100.7 F (38.2 C) (Oral)  Ht 5\' 7"  (1.702 m)  Wt 101 lb (45.813 kg)  BMI 15.82 kg/m2  SpO2 97%  General - cachectic, wearing oxygen ENT - No sinus tenderness, no oral exudate, no LAN Cardiac - s1s2 regular, no murmur Chest - Decreased breath sounds, no wheeze Back - No focal tenderness Abd - Soft, non-tender Ext - No edema Neuro - Normal strength Skin - No rashes Psych - anxious   Assessment/Plan:  COPD with emphysema. Plan: - continue symbicort - prn combivent, albuterol  Chronic respiratory failure with hypoxia. Plan: - he is using 4  to 8 liters oxygen - will have [facility] assess him for POC >> explained that he might not be candidate for POC given his O2 needs  Tobacco abuse. Plan: - discussed importance of smoking cessation  Cachexia. Plan: - discussed options to assist with maintaining his weight - f/u with PCP   Chesley Mires, MD [facility] Pager:  [phone] 02/19/2015, 2:23 PM

## 2015-02-19 NOTE — Patient Instructions (Signed)
Flu shot today Follow up in 6 months 

## 2015-04-03 ENCOUNTER — Other Ambulatory Visit: Payer: Self-pay | Admitting: Interventional Cardiology

## 2015-04-07 NOTE — Telephone Encounter (Signed)
Requested medication not on current med list. Just wanted to clarify that he should still be taking. Please advise. Thanks, MI

## 2015-04-07 NOTE — Telephone Encounter (Signed)
Please see OV from 03/06/14. It looks like his RX expired and just needs to be re-ordered. Thanks.

## 2015-04-27 ENCOUNTER — Ambulatory Visit: Payer: Medicare Other

## 2015-04-27 ENCOUNTER — Ambulatory Visit: Payer: Medicare Other | Admitting: Hematology & Oncology

## 2015-04-27 ENCOUNTER — Other Ambulatory Visit: Payer: Medicare Other

## 2015-05-06 ENCOUNTER — Ambulatory Visit: Payer: Medicare Other

## 2015-05-06 ENCOUNTER — Ambulatory Visit: Payer: Medicare Other | Admitting: Hematology & Oncology

## 2015-05-06 ENCOUNTER — Other Ambulatory Visit: Payer: Medicare Other

## 2015-06-02 ENCOUNTER — Other Ambulatory Visit: Payer: Self-pay | Admitting: Nurse Practitioner

## 2015-06-04 ENCOUNTER — Ambulatory Visit: Payer: Medicare Other | Admitting: Hematology & Oncology

## 2015-06-04 ENCOUNTER — Other Ambulatory Visit: Payer: Medicare Other

## 2015-06-04 ENCOUNTER — Ambulatory Visit: Payer: Medicare Other

## 2015-07-01 ENCOUNTER — Other Ambulatory Visit: Payer: Self-pay | Admitting: Internal Medicine

## 2015-07-06 ENCOUNTER — Other Ambulatory Visit: Payer: Self-pay | Admitting: Internal Medicine

## 2015-07-13 ENCOUNTER — Other Ambulatory Visit: Payer: Self-pay | Admitting: Internal Medicine

## 2015-07-23 ENCOUNTER — Other Ambulatory Visit: Payer: Self-pay | Admitting: Internal Medicine

## 2015-07-24 ENCOUNTER — Other Ambulatory Visit: Payer: Self-pay | Admitting: Internal Medicine

## 2015-07-24 ENCOUNTER — Other Ambulatory Visit: Payer: Self-pay | Admitting: Pulmonary Disease

## 2015-07-24 ENCOUNTER — Other Ambulatory Visit: Payer: Self-pay | Admitting: Interventional Cardiology

## 2015-07-27 ENCOUNTER — Telehealth: Payer: Self-pay | Admitting: Pulmonary Disease

## 2015-07-27 ENCOUNTER — Other Ambulatory Visit: Payer: Self-pay | Admitting: *Deleted

## 2015-07-27 MED ORDER — ATORVASTATIN CALCIUM 40 MG PO TABS: 40.0000 mg | ORAL_TABLET | Freq: Every day | ORAL | Status: DC

## 2015-07-27 MED ORDER — ALBUTEROL SULFATE (2.5 MG/3ML) 0.083% IN NEBU: 2.5000 mg | INHALATION_SOLUTION | Freq: Four times a day (QID) | RESPIRATORY_TRACT | Status: DC | PRN

## 2015-07-27 MED ORDER — CLOPIDOGREL BISULFATE 75 MG PO TABS: ORAL_TABLET | ORAL | Status: DC

## 2015-07-27 MED ORDER — ALBUTEROL SULFATE HFA 108 (90 BASE) MCG/ACT IN AERS: INHALATION_SPRAY | RESPIRATORY_TRACT | Status: DC

## 2015-07-27 MED ORDER — BUDESONIDE-FORMOTEROL FUMARATE 160-4.5 MCG/ACT IN AERO: INHALATION_SPRAY | RESPIRATORY_TRACT | Status: DC

## 2015-07-27 NOTE — Telephone Encounter (Signed)
Spoke with the pt  He is requesting refills on albuterol neb sol, proair and symbicort  I offered appt since he is due next month  He states will have to check with his nurse first, and then call for appt  I have refilled meds x 1 only  Nothing further needed

## 2015-07-28 ENCOUNTER — Other Ambulatory Visit: Payer: Self-pay | Admitting: Internal Medicine

## 2015-07-29 ENCOUNTER — Telehealth: Payer: Self-pay | Admitting: Interventional Cardiology

## 2015-07-29 NOTE — Telephone Encounter (Signed)
Error// Was able to help without sending a note to the nurse

## 2015-08-03 ENCOUNTER — Telehealth: Payer: Self-pay | Admitting: *Deleted

## 2015-08-03 NOTE — Telephone Encounter (Signed)
Submitted PA thru [facility] for Albuterol neb solution. Key: [id number] Will await response.  [employer] [phone]

## 2015-08-06 NOTE — Telephone Encounter (Signed)
Deport and spoke with pharmacy tech and informed her that Albuterol needs to be ran thru Medicare Part B. She stated she would take care of it after stating that they can't run part B Medicare. Informed her to call back if they needed anything further.  FYI:

## 2015-08-06 NOTE — Telephone Encounter (Signed)
Noted  

## 2015-08-07 NOTE — Telephone Encounter (Signed)
Called and spoke with patient regarding Albuterol neb Rx  Pt expressed understanding and states that he will call the pharmacy to speak with [facility] regarding his Rx and the route to take on getting his medications.  Apologized again for any inconvenience and all the confusion. Pt to call if anything else needed with Rx. Nothing further needed.

## 2015-08-07 NOTE — Telephone Encounter (Signed)
Spoke with the pharmacist at [facility] to f/u on the albuterol neb solution and he states that they have applied to run Medicare Part B but are still waiting. He states pt can pick up the Albuterol and he will be able to get it for $3 or less like he would thru Part B. If pt needs med at this time they state he needs to call them let them know so they can fill it.

## 2015-08-07 NOTE — Telephone Encounter (Addendum)
Patient aware of status of Albuterol at this point.  Spoke with Velna Hatchet - states that they do not run meds through Medicare Part B and that he will have to pay $12+ for the Rx. Velna Hatchet states that she did not speak to the nurse at that called in earlier and that it was possibly Sam she spoke with. Velna Hatchet states that she is not sure where this information came from because they do not process through Medicare Part B. Medication will have to be transferred to another pharmacy ([facility] or [facility]) which would file under Medicare Part B but they dont do the delivery services like [employer].   Will need to contact patient back again to apologize for misinformation and make aware of message per Brandi.

## 2015-08-20 ENCOUNTER — Other Ambulatory Visit: Payer: Self-pay | Admitting: Pulmonary Disease

## 2015-08-20 ENCOUNTER — Other Ambulatory Visit: Payer: Self-pay | Admitting: Interventional Cardiology

## 2015-08-21 ENCOUNTER — Other Ambulatory Visit: Payer: Self-pay | Admitting: Interventional Cardiology

## 2015-09-04 ENCOUNTER — Telehealth: Payer: Self-pay | Admitting: Pulmonary Disease

## 2015-09-04 NOTE — Telephone Encounter (Signed)
Spoke with pt. He is wanting [place] to send an order to [facility] for a POC. Advised the pt that since we have not seen in the past 6 months, [facility] would more than likely need an OV to be done for this. He verbalized understanding. OV has been scheduled for 10/13/15 at 10am, this was schedule on this date and time per the pt's request. Nothing further was needed.

## 2015-09-23 ENCOUNTER — Telehealth: Payer: Self-pay | Admitting: Pulmonary Disease

## 2015-09-23 MED ORDER — ALBUTEROL SULFATE HFA 108 (90 BASE) MCG/ACT IN AERS: INHALATION_SPRAY | RESPIRATORY_TRACT | Status: DC

## 2015-09-23 MED ORDER — IPRATROPIUM-ALBUTEROL 20-100 MCG/ACT IN AERS: INHALATION_SPRAY | RESPIRATORY_TRACT | Status: DC

## 2015-09-23 MED ORDER — BUDESONIDE-FORMOTEROL FUMARATE 160-4.5 MCG/ACT IN AERO: INHALATION_SPRAY | RESPIRATORY_TRACT | Status: DC

## 2015-09-23 NOTE — Telephone Encounter (Signed)
Spoke with pt, requesting refills on below meds to below verified pharmacy.  These have been sent.  Nothing further needed.

## 2015-09-24 ENCOUNTER — Other Ambulatory Visit: Payer: Self-pay | Admitting: Interventional Cardiology

## 2015-09-26 ENCOUNTER — Other Ambulatory Visit: Payer: Self-pay | Admitting: Pulmonary Disease

## 2015-09-29 ENCOUNTER — Other Ambulatory Visit: Payer: Self-pay | Admitting: Interventional Cardiology

## 2015-10-01 NOTE — Telephone Encounter (Signed)
Patient hasn't been seen since 03/06/2014. He has cancelled his appointments twice with Dr Scarlette Calico.

## 2015-10-02 NOTE — Telephone Encounter (Signed)
Please give 15 day supply with message to refer all further refills to the pts PCP. Thanks.

## 2015-10-12 ENCOUNTER — Other Ambulatory Visit: Payer: Self-pay | Admitting: Interventional Cardiology

## 2015-10-13 ENCOUNTER — Ambulatory Visit: Payer: Medicare Other | Admitting: Adult Health

## 2015-10-22 ENCOUNTER — Other Ambulatory Visit: Payer: Self-pay | Admitting: Interventional Cardiology

## 2016-01-12 ENCOUNTER — Ambulatory Visit: Payer: Medicare Other | Admitting: Interventional Cardiology

## 2016-01-15 ENCOUNTER — Other Ambulatory Visit: Payer: Self-pay | Admitting: Interventional Cardiology

## 2016-01-15 NOTE — Telephone Encounter (Signed)
In chart as: atorvastatin (LIPITOR) 40 MG tablet Take 1 tablet (40 mg total) by mouth daily at 6 PM. Please keep your 9/5 appointment for futher refills     Disp: 30 tablet Refills:    Class: Normal Start: 01/15/2016  Documented:2 years ago Last refill: 12/25/2015 To be filled at: [facility], [place]: [phone] Per rx request, this was last refilled by the pharmacy on 12/25/15 for #30. Patient has an appointment on 01/25/16 so he would have enough until his appointment.

## 2016-01-25 ENCOUNTER — Encounter: Payer: Self-pay | Admitting: Interventional Cardiology

## 2016-01-25 ENCOUNTER — Ambulatory Visit (INDEPENDENT_AMBULATORY_CARE_PROVIDER_SITE_OTHER): Payer: Medicare Other | Admitting: Interventional Cardiology

## 2016-01-25 ENCOUNTER — Telehealth: Payer: Self-pay | Admitting: Interventional Cardiology

## 2016-01-25 VITALS — BP 90/60 | HR 107 | Ht 67.0 in | Wt 103.4 lb

## 2016-01-25 DIAGNOSIS — E785 Hyperlipidemia, unspecified: Secondary | ICD-10-CM

## 2016-01-25 DIAGNOSIS — I2510 Atherosclerotic heart disease of native coronary artery without angina pectoris: Secondary | ICD-10-CM

## 2016-01-25 DIAGNOSIS — I951 Orthostatic hypotension: Secondary | ICD-10-CM

## 2016-01-25 DIAGNOSIS — Z720 Tobacco use: Secondary | ICD-10-CM | POA: Diagnosis not present

## 2016-01-25 MED ORDER — NITROGLYCERIN 0.4 MG SL SUBL: 0.4000 mg | SUBLINGUAL_TABLET | SUBLINGUAL | 3 refills | Status: AC | PRN

## 2016-01-25 MED ORDER — ISOSORBIDE MONONITRATE ER 30 MG PO TB24: 15.0000 mg | ORAL_TABLET | Freq: Every day | ORAL | 11 refills | Status: DC

## 2016-01-25 NOTE — Patient Instructions (Signed)
Medication Instructions:  Same-no changes  Labwork: None  Testing/Procedures: None  Follow-Up: In 1 year.  Any Other Special Instructions Will Be Listed Below (If Applicable).     If you need a refill on your cardiac medications before your next appointment, please call your pharmacy.

## 2016-01-25 NOTE — Telephone Encounter (Signed)
New message    Pt is calling about meds. Please call.

## 2016-01-25 NOTE — Telephone Encounter (Addendum)
The pt had OV with Dr Irish Lack today and was unsure if he was taking Isosorbide or NTG. The pt is calling to let us know that he is not currently taking Isosorbide or NTG.  He is advised per Dr Irish Lack to take Isosorbide 15 mg daily and NTG 0.4 mg as needed for CP.

## 2016-01-25 NOTE — Progress Notes (Signed)
Cardiology Office Note   Date:  01/25/2016   ID:  [name], DOB 1955-06-05, MRN [id number]  PCP:  Berkley Harvey, NP    No chief complaint on file.    Wt Readings from Last 3 Encounters:  01/25/16 103 lb 6.4 oz (46.9 kg)  02/19/15 101 lb (45.8 kg)  03/06/14 108 lb (49 kg)       History of Present Illness: Philip Coleman is a 60 y.o. male  who had 2 vessel stenting in July 2015.  He had one episode of chest discomfort in the past few weeks. It lasted less than an hour. It resolves spontaneously. Since that time, he has not had any further discomfort. He continues to smoke 2-3 /day.   When he walks, he has shortness of breath. He does not have any of the same chest discomfort which prompted his hospital admission in July 2015. He continues to wear home oxygen for his severe lung disease. Blood pressures and his primary care doctor's office have been normal. He does report that he does not drink a lot of water. He does drink a lot of soda.  He is not walking in hot weather.     Past Medical History:  Diagnosis Date  . Anxiety state, unspecified   . Chronic airway obstruction, not elsewhere classified   . Chronic respiratory failure ([place])   . COPD (chronic obstructive pulmonary disease) ([place])   . Cough   . Depressive disorder, not elsewhere classified   . Diarrhea   . Headache(784.0)   . Hodgkin's disease 1993   with abdominal surgical scar -pt unable to describe   . Lumbago   . NSTEMI (non-ST elevated myocardial infarction) ([place])   . Other dyspnea and respiratory abnormality   . Other malaise and fatigue   . Pneumonia   . Spontaneous pneumothorax    left  . Spontaneous pneumothorax    right  . Thyroid disease    hyper, not on meds  . Unspecified constipation   . Weight loss     Past Surgical History:  Procedure Laterality Date  . CHEST TUBE INSERTION     left  . CHEST TUBE INSERTION     right  . LEFT HEART CATHETERIZATION WITH CORONARY ANGIOGRAM  N/A 11/21/2013   Procedure: LEFT HEART CATHETERIZATION WITH CORONARY ANGIOGRAM;  Surgeon: Peter M Martinique, MD;  Location: [facility] CATH LAB;  Service: Cardiovascular;  Laterality: N/A;  . PERCUTANEOUS CORONARY INTERVENTION-BALLOON ONLY  11/22/2013   Procedure: PERCUTANEOUS CORONARY INTERVENTION-BALLOON ONLY;  Surgeon: Jettie Booze, MD;  Location: [facility] CATH LAB;  Service: Cardiovascular;;  CFX  . PERCUTANEOUS CORONARY STENT INTERVENTION (PCI-S) N/A 11/22/2013   Procedure: PERCUTANEOUS CORONARY STENT INTERVENTION (PCI-S);  Surgeon: Jettie Booze, MD;  Location[facility] C[facility] LAB;  Service: Cardiovascular;  Laterality: N/A;  LAD  . SPLENECTOMY  1987  . teeth extracted for dentures  2012     Current Outpatient Prescriptions  Medication Sig Dispense Refill  . albuterol (PROVENTIL HFA) 108 (90 BASE) MCG/ACT inhaler INHALE 2 PUFFS INTO THE LUNGS EVERY 6 HOURS AS NEEDED FOR WHEEZING 6.7 g 2  . albuterol (PROVENTIL) (2.5 MG/3ML) 0.083% nebulizer solution Take 3 mLs (2.5 mg total) by nebulization every 6 (six) hours as needed for wheezing or shortness of breath. 120 vial 0  . albuterol (PROVENTIL) (2.5 MG/3ML) 0.083% nebulizer solution TAKE THREE   MLS (2.5 MG TOTAL) BY NEBULIZATION EVERY 6 (SIX) HOURS AS NEEDED FOR WHEEZING OR SHORTNESS OF  BREATH. 120 mL 1  . atorvastatin (LIPITOR) 40 MG tablet Take 1 tablet (40 mg total) by mouth daily at 6 PM. Please keep your 9/5 appointment for futher refills 30 tablet 2  . budesonide-formoterol (SYMBICORT) 160-4.5 MCG/ACT inhaler INHALE 2 PUFF INTO THE LUNGS TWICE DAILY 10.2 g 5  . clonazePAM (KLONOPIN) 0.5 MG tablet TAKE 1 TABLET BY MOUTH TWICE A DAY AS NEEDED    . clopidogrel (PLAVIX) 75 MG tablet TAKE 1 TABLET (75 MG TOTAL) BY MOUTH DAILY. 30 tablet 0  . Ipratropium-Albuterol (COMBIVENT RESPIMAT) 20-100 MCG/ACT AERS respimat INHALE 1 PUFF INTO THE LUNGS EVERY 6 (SIX) HOURS AS NEEDED FOR WHEEZING OR SHORTNESS OF BREATH. 4 g 5  . pantoprazole (PROTONIX) 40 MG tablet  Take 1 tablet (40 mg total) by mouth daily. 90 tablet 3  . sertraline (ZOLOFT) 100 MG tablet TAKE ONE TABLET BY MOUTH DAILY    . tamsulosin (FLOMAX) 0.4 MG CAPS capsule Take 0.4 mg by mouth daily after supper.     . traMADol (ULTRAM) 50 MG tablet TAKE TWO TABLETS   BY MOUTH   EVERY 12 HOURS   AS NEEDED   FOR PAIN 120 tablet 0  . VENTOLIN HFA 108 (90 BASE) MCG/ACT inhaler INHALE TWO   PUFFS INTO THE LUNGS EVERY 6 HOURS AS NEEDED 18 g 0  . isosorbide mononitrate (IMDUR) 15 mg TB24 24 hr tablet Take 7.5 mg by mouth daily. Take 1/2 tab     No current facility-administered medications for this visit.     Allergies:   Codeine    Social History:  The patient  reports that he has been smoking Cigarettes.  He has a 10.25 pack-year smoking history. He uses smokeless tobacco. He reports that he drinks about 3.6 oz of alcohol per week . He reports that he does not use drugs.   Family History:  The patient's family history includes Cancer in his sister; Cancer (age of onset: 71) in his mother; Diabetes in his mother; Hyperlipidemia in his mother; Pneumonia in his father; Stroke in his sister.    ROS:  Please see the history of present illness.   Otherwise, review of systems are positive for chronic weight loss issues-stable.   All other systems are reviewed and negative.    PHYSICAL EXAM: VS:  BP 90/60   Pulse (!) 107   Ht 5\' 7"  (1.702 m)   Wt 103 lb 6.4 oz (46.9 kg)   SpO2 98% Comment: RESTING 2 LITERS, W/ ACTIVITY 5-8 LITERS  BMI 16.19 kg/m  , BMI Body mass index is 16.19 kg/m. GEN: Well nourished, well developed, in no acute distress  HEENT: normal  Neck: no JVD, carotid bruits, or masses Cardiac: RRR; no murmurs, rubs, or gallops,no edema  Respiratory:  clear to auscultation bilaterally, normal work of breathing GI: soft, nontender, nondistended, + BS MS: no deformity or atrophy  Skin: warm and dry, no rash Neuro:  Strength and sensation are intact Psych: euthymic mood, full  affect   EKG:   The ekg ordered today demonstrates sinus tachycardia, no ST segment changes   Recent Labs: No results found for requested labs within last 8760 hours.   Lipid Panel    Component Value Date/Time   CHOL 230 (H) 11/21/2013 1305   CHOL 203 (H) 09/09/2013 1357   TRIG 43 11/21/2013 1305   HDL 62 11/21/2013 1305   HDL 53 09/09/2013 1357   CHOLHDL 3.7 11/21/2013 1305   VLDL 9 11/21/2013 1305  LDLCALC 159 (H) 11/21/2013 1305   LDLCALC 120 (H) 09/09/2013 1357     Other studies Reviewed: Additional studies/ records that were reviewed today with results demonstrating: cath records reviewed from 2015.   ASSESSMENT AND PLAN:  1. CAD: Possible episodic angina. Given that he has 2 drug-eluting stents, continue clopidogrel 75 mg daily. He also wants to come off other medications if possible. If CP returns, we will have to restart some of the antianginal medicines. 2. 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  Unable to deliver a stent. 3. Hyperlipidemia: LDL was well above target in the hospital. He has been on atorvastatin 40 mg. Will check lipid profile. 4. Hypotension: BP low at times, lowest per his report is "52/59".   He has an episode of syncope. I encouraged him to try to stay better hydrated. He needs to drink plenty of water. He should stay away from sodas and alcohol. 5. Tobacco abuse: I encouraged him to stop smoking completely. 6. He will check to see if he is taking isosorbide. If he is not, would restart this medication.     Current medicines are reviewed at length with the patient today.  The patient concerns regarding his medicines were addressed.  The following changes have been made:  No change  Labs/ tests ordered today include:  No orders of the defined types were placed in this encounter.   Recommend 150 minutes/week of aerobic exercise Low fat, low carb, high fiber diet  recommended  Disposition:   FU in 1 year   Signed, Larae Grooms, MD  01/25/2016 2:09 PM    [facility] [address], [place], [place]  [address] Phone: [phone]; Fax: [phone]

## 2016-01-25 NOTE — Addendum Note (Signed)
**Note De-Identified Tamarion Haymond Obfuscation** Addended by: Dennie Fetters on: 01/25/2016 04:25 PM   Modules accepted: Orders

## 2016-02-04 ENCOUNTER — Other Ambulatory Visit: Payer: Self-pay | Admitting: Interventional Cardiology

## 2016-02-12 ENCOUNTER — Other Ambulatory Visit: Payer: Self-pay | Admitting: Pulmonary Disease

## 2016-02-17 ENCOUNTER — Other Ambulatory Visit: Payer: Self-pay | Admitting: Pulmonary Disease

## 2016-02-22 ENCOUNTER — Telehealth: Payer: Self-pay | Admitting: Interventional Cardiology

## 2016-02-22 NOTE — Telephone Encounter (Signed)
New Message:    Pt said he saw Dr Annabell Sabal on 01-30-16. He started him on a new medicine,(can not remember the name of it). He says he can not take it,it makes him dizzy,off balance,fell several times,no strength n his legs and can hardly walk. He stopped taking it on Saturday,he is now feeling better.Please call,he needs to take something else.

## 2016-02-22 NOTE — Telephone Encounter (Signed)
The pt was advised a his last OV with Dr Irish Lack on 9/18 to resume Isosorbide 15 mg daily as he had not been taking.  The pt states that he recently resumed taking and has been weak and dizzy to the point that he has been falling. He states that he fell out of the bathtub and in his kitchen late last week. He states that he has bruising from falls but denies bleeding and states that he did not hit his head during either fall.  He has stopped taking Isosorbide and states that he is feeling much better. The pt does have NTG if he needs it.  The pt is advised that I will call him back if Dr Irish Lack has any recommendations. He verbalized understanding.

## 2016-02-22 NOTE — Telephone Encounter (Signed)
The pt is advised and he verbalized understanding. 

## 2016-02-22 NOTE — Telephone Encounter (Signed)
Agree with holding isosorbide

## 2016-03-11 ENCOUNTER — Other Ambulatory Visit: Payer: Self-pay | Admitting: Pulmonary Disease

## 2016-03-17 ENCOUNTER — Ambulatory Visit: Payer: Medicare Other | Admitting: Pulmonary Disease

## 2016-03-21 ENCOUNTER — Other Ambulatory Visit: Payer: Self-pay | Admitting: Interventional Cardiology

## 2016-04-11 ENCOUNTER — Encounter: Payer: Self-pay | Admitting: Pulmonary Disease

## 2016-04-11 ENCOUNTER — Ambulatory Visit (INDEPENDENT_AMBULATORY_CARE_PROVIDER_SITE_OTHER): Payer: Medicare Other | Admitting: Pulmonary Disease

## 2016-04-11 VITALS — BP 82/60 | HR 112 | Ht 67.0 in | Wt 100.2 lb

## 2016-04-11 DIAGNOSIS — R64 Cachexia: Secondary | ICD-10-CM

## 2016-04-11 DIAGNOSIS — I2510 Atherosclerotic heart disease of native coronary artery without angina pectoris: Secondary | ICD-10-CM | POA: Diagnosis not present

## 2016-04-11 DIAGNOSIS — J9611 Chronic respiratory failure with hypoxia: Secondary | ICD-10-CM

## 2016-04-11 DIAGNOSIS — J432 Centrilobular emphysema: Secondary | ICD-10-CM | POA: Diagnosis not present

## 2016-04-11 DIAGNOSIS — Z23 Encounter for immunization: Secondary | ICD-10-CM

## 2016-04-11 NOTE — Progress Notes (Signed)
Current Outpatient Prescriptions on File Prior to Visit  Medication Sig  . albuterol (PROVENTIL HFA) 108 (90 BASE) MCG/ACT inhaler INHALE 2 PUFFS INTO THE LUNGS EVERY 6 HOURS AS NEEDED FOR WHEEZING  . albuterol (PROVENTIL) (2.5 MG/3ML) 0.083% nebulizer solution Take 3 mLs (2.5 mg total) by nebulization every 6 (six) hours as needed for wheezing or shortness of breath.  [name] albuterol (PROVENTIL) (2.5 MG/3ML) 0.083% nebulizer solution TAKE THREE   MLS (2.5 MG TOTAL) BY NEBULIZATION EVERY 6 (SIX) HOURS AS NEEDED FOR WHEEZING OR SHORTNESS OF BREATH.  [name] atorvastatin (LIPITOR) 40 MG tablet TAKE 1 TABLET (40 MG TOTAL) BY MOUTH DAILY AT 6 PM. PLEASE KEEP YOUR 9/5 APPOINTMENT FOR FUTHER REFILLS  . clonazePAM (KLONOPIN) 0.5 MG tablet TAKE 1 TABLET BY MOUTH TWICE A DAY AS NEEDED  . clopidogrel (PLAVIX) 75 MG tablet TAKE 1 TABLET (75 MG TOTAL) BY MOUTH DAILY.  [name] Ipratropium-Albuterol (COMBIVENT RESPIMAT) 20-100 MCG/ACT AERS respimat INHALE 1 PUFF INTO THE LUNGS EVERY 6 (SIX) HOURS AS NEEDED FOR WHEEZING OR SHORTNESS OF BREATH.  . isosorbide mononitrate (IMDUR) 30 MG 24 hr tablet Take 0.5 tablets (15 mg total) by mouth daily.  . nitroGLYCERIN (NITROSTAT) 0.4 MG SL tablet Place 1 tablet (0.4 mg total) under the tongue every 5 (five) minutes as needed for chest pain.  . pantoprazole (PROTONIX) 40 MG tablet Take 1 tablet (40 mg total) by mouth daily.  . sertraline (ZOLOFT) 100 MG tablet TAKE ONE TABLET BY MOUTH DAILY  . SYMBICORT 160-4.5 MCG/ACT inhaler INHALE 2 PUFF INTO THE LUNGS TWICE DAILY  . tamsulosin (FLOMAX) 0.4 MG CAPS capsule Take 0.4 mg by mouth daily after supper.   . traMADol (ULTRAM) 50 MG tablet TAKE TWO TABLETS   BY MOUTH   EVERY 12 HOURS   AS NEEDED   FOR PAIN  . VENTOLIN HFA 108 (90 BASE) MCG/ACT inhaler INHALE TWO   PUFFS INTO THE LUNGS EVERY 6 HOURS AS NEEDED   No current facility-administered medications on file prior to visit.     Chief Complaint  Patient presents with  . Follow-up    pt  states that his breathing is okay most days. some problems "here and there". still using O2 2 liters continuous. Pt states that when he walks he turns his O2 up to 8 Liters because he feels that he needs more O2 - no reported low O2 sats, no pulse Ox at home.  Flu vaccine today if able    Pulmonary tests CT chest 04/01/12>>advanced emphysema, rt apical scarring Alpha 1 AT 09/24/12 >> 176, MM phenotype PFT 10/18/12 >> FEV1 0.62 (17%), FEV1% 34, TLC 6.46 (98%), RV 2.56 (256%), DLCO 27%, no BD response  Cardiac tests Echo 11/22/13 >> EF 60 to [id number], grade 1 diastolic dysfx   Past medical history Hodgkin's disease, PTX, PNA, Anxiety, Depression, Back pain, HA, CAD, HLD, s/p splenectomy  Past surgical history, Family history, Social history, Allergies reviewed  Vital signs BP (!) 82/60 (BP Location: Left Arm, Cuff Size: Normal)   Pulse (!) 112   Ht 5\' 7"  (1.702 m)   Wt 100 lb 3.2 oz (45.5 kg)   SpO2 98%   BMI 15.69 kg/m    History of Present Illness: Philip Coleman is a 60 y.o. male smoker with severe COPD and chronic hypoxic respiratory failure.  He has good days and bad days.  He has been feeling better recently.  He gets occasional cough with clear sputum.  He is not having wheeze, or  fever.  He denies chest pain.  He feels combivent helps.  He uses 2 to 4 liters oxygen at home with rest, but has to go up to 8 liters when he is walking.  He is accompanied by his home nurse.  Physical Exam:  General - cachectic, wearing oxygen, in wheel chair ENT - No sinus tenderness, no oral exudate, no LAN Cardiac - s1s2 regular, no murmur Chest - Decreased breath sounds, no wheeze Back - No focal tenderness Abd - Soft, non-tender Ext - No edema Neuro - Normal strength Skin - No rashes Psych - anxious   Assessment/Plan:  COPD with emphysema. - continue symbicort - prn combivent, albuterol  Chronic respiratory failure with hypoxia. - he is using 4 to 8 liters oxygen  Tobacco  abuse. - discussed importance of smoking cessation  Cachexia. - discussed options to assist with maintaining his weight - f/u with PCP  Chronic low blood pressure. - he normally runs SBP in 90's - monitor clinically, and follow up with PCP and cardiology   Patient Instructions  Flu shot today  Follow up in 6 months    Philip Mires, MD [facility] Pager:  [phone] 04/11/2016, 2:59 PM

## 2016-04-11 NOTE — Patient Instructions (Signed)
Flu shot today Follow up in 6 months 

## 2016-04-15 ENCOUNTER — Other Ambulatory Visit: Payer: Self-pay | Admitting: Pulmonary Disease

## 2016-06-02 ENCOUNTER — Inpatient Hospital Stay (HOSPITAL_COMMUNITY)
Admission: EM | Admit: 2016-06-02 | Discharge: 2016-06-05 | DRG: 193 | Disposition: A | Discharge status: Home Health | Payer: Medicare Other | Attending: Family Medicine | Admitting: Family Medicine

## 2016-06-02 ENCOUNTER — Emergency Department (HOSPITAL_COMMUNITY): Payer: Medicare Other

## 2016-06-02 ENCOUNTER — Encounter (HOSPITAL_COMMUNITY): Payer: Self-pay | Admitting: Emergency Medicine

## 2016-06-02 DIAGNOSIS — E785 Hyperlipidemia, unspecified: Secondary | ICD-10-CM | POA: Diagnosis not present

## 2016-06-02 DIAGNOSIS — Z9981 Dependence on supplemental oxygen: Secondary | ICD-10-CM

## 2016-06-02 DIAGNOSIS — Z8571 Personal history of Hodgkin lymphoma: Secondary | ICD-10-CM

## 2016-06-02 DIAGNOSIS — R0600 Dyspnea, unspecified: Secondary | ICD-10-CM | POA: Diagnosis present

## 2016-06-02 DIAGNOSIS — N400 Enlarged prostate without lower urinary tract symptoms: Secondary | ICD-10-CM | POA: Diagnosis present

## 2016-06-02 DIAGNOSIS — J9621 Acute and chronic respiratory failure with hypoxia: Secondary | ICD-10-CM | POA: Diagnosis present

## 2016-06-02 DIAGNOSIS — J9620 Acute and chronic respiratory failure, unspecified whether with hypoxia or hypercapnia: Secondary | ICD-10-CM | POA: Diagnosis present

## 2016-06-02 DIAGNOSIS — R4020 Unspecified coma: Secondary | ICD-10-CM

## 2016-06-02 DIAGNOSIS — J441 Chronic obstructive pulmonary disease with (acute) exacerbation: Secondary | ICD-10-CM | POA: Diagnosis present

## 2016-06-02 DIAGNOSIS — E43 Unspecified severe protein-calorie malnutrition: Secondary | ICD-10-CM | POA: Diagnosis not present

## 2016-06-02 DIAGNOSIS — K219 Gastro-esophageal reflux disease without esophagitis: Secondary | ICD-10-CM | POA: Diagnosis not present

## 2016-06-02 DIAGNOSIS — J440 Chronic obstructive pulmonary disease with acute lower respiratory infection: Secondary | ICD-10-CM | POA: Diagnosis present

## 2016-06-02 DIAGNOSIS — I2510 Atherosclerotic heart disease of native coronary artery without angina pectoris: Secondary | ICD-10-CM | POA: Diagnosis present

## 2016-06-02 DIAGNOSIS — F411 Generalized anxiety disorder: Secondary | ICD-10-CM | POA: Diagnosis present

## 2016-06-02 DIAGNOSIS — J189 Pneumonia, unspecified organism: Secondary | ICD-10-CM | POA: Diagnosis present

## 2016-06-02 DIAGNOSIS — R51 Headache: Secondary | ICD-10-CM | POA: Diagnosis present

## 2016-06-02 DIAGNOSIS — I252 Old myocardial infarction: Secondary | ICD-10-CM

## 2016-06-02 DIAGNOSIS — F17210 Nicotine dependence, cigarettes, uncomplicated: Secondary | ICD-10-CM | POA: Diagnosis present

## 2016-06-02 DIAGNOSIS — J439 Emphysema, unspecified: Secondary | ICD-10-CM | POA: Diagnosis present

## 2016-06-02 DIAGNOSIS — Z833 Family history of diabetes mellitus: Secondary | ICD-10-CM

## 2016-06-02 DIAGNOSIS — Z7951 Long term (current) use of inhaled steroids: Secondary | ICD-10-CM | POA: Diagnosis not present

## 2016-06-02 DIAGNOSIS — R519 Headache, unspecified: Secondary | ICD-10-CM

## 2016-06-02 DIAGNOSIS — F419 Anxiety disorder, unspecified: Secondary | ICD-10-CM | POA: Diagnosis present

## 2016-06-02 DIAGNOSIS — W19XXXA Unspecified fall, initial encounter: Secondary | ICD-10-CM

## 2016-06-02 DIAGNOSIS — Z681 Body mass index (BMI) 19 or less, adult: Secondary | ICD-10-CM | POA: Diagnosis not present

## 2016-06-02 DIAGNOSIS — I5032 Chronic diastolic (congestive) heart failure: Secondary | ICD-10-CM | POA: Diagnosis present

## 2016-06-02 DIAGNOSIS — Z823 Family history of stroke: Secondary | ICD-10-CM | POA: Diagnosis not present

## 2016-06-02 DIAGNOSIS — R350 Frequency of micturition: Secondary | ICD-10-CM | POA: Diagnosis not present

## 2016-06-02 DIAGNOSIS — Z720 Tobacco use: Secondary | ICD-10-CM | POA: Diagnosis present

## 2016-06-02 DIAGNOSIS — J9622 Acute and chronic respiratory failure with hypercapnia: Secondary | ICD-10-CM | POA: Diagnosis not present

## 2016-06-02 DIAGNOSIS — N401 Enlarged prostate with lower urinary tract symptoms: Secondary | ICD-10-CM | POA: Diagnosis not present

## 2016-06-02 DIAGNOSIS — Z79899 Other long term (current) drug therapy: Secondary | ICD-10-CM

## 2016-06-02 DIAGNOSIS — J181 Lobar pneumonia, unspecified organism: Secondary | ICD-10-CM | POA: Diagnosis not present

## 2016-06-02 LAB — BLOOD GAS, ARTERIAL
Acid-Base Excess: 6.3 mmol/L — ABNORMAL HIGH (ref 0.0–2.0)
Bicarbonate: 31.8 mmol/L — ABNORMAL HIGH (ref 20.0–28.0)
DELIVERY SYSTEMS: POSITIVE
DRAWN BY: 11249
FIO2: 35
MODE: POSITIVE
O2 SAT: 96.6 %
PEEP: 5 cmH2O
PH ART: 7.405 (ref 7.350–7.450)
Patient temperature: 97.5
Pressure control: 5 cmH2O
RATE: 14 resp/min
pCO2 arterial: 51.3 mmHg — ABNORMAL HIGH (ref 32.0–48.0)
pO2, Arterial: 86.6 mmHg (ref 83.0–108.0)

## 2016-06-02 LAB — I-STAT CHEM 8, ED
BUN: 10 mg/dL (ref 6–20)
Calcium, Ion: 1.03 mmol/L — ABNORMAL LOW (ref 1.15–1.40)
Chloride: 94 mmol/L — ABNORMAL LOW (ref 101–111)
Creatinine, Ser: 1 mg/dL (ref 0.61–1.24)
Glucose, Bld: 129 mg/dL — ABNORMAL HIGH (ref 65–99)
HEMATOCRIT: 35 % — AB (ref 39.0–52.0)
Hemoglobin: 11.9 g/dL — ABNORMAL LOW (ref 13.0–17.0)
POTASSIUM: 3.2 mmol/L — AB (ref 3.5–5.1)
SODIUM: 135 mmol/L (ref 135–145)
TCO2: 33 mmol/L (ref 0–100)

## 2016-06-02 LAB — BRAIN NATRIURETIC PEPTIDE: B NATRIURETIC PEPTIDE 5: 189.1 pg/mL — AB (ref 0.0–100.0)

## 2016-06-02 LAB — CBC WITH DIFFERENTIAL/PLATELET
BASOS ABS: 0.1 10*3/uL (ref 0.0–0.1)
BASOS PCT: 0 %
EOS ABS: 0.7 10*3/uL (ref 0.0–0.7)
Eosinophils Relative: 4 %
HCT: 37.4 % — ABNORMAL LOW (ref 39.0–52.0)
Hemoglobin: 11 g/dL — ABNORMAL LOW (ref 13.0–17.0)
Lymphocytes Relative: 25 %
Lymphs Abs: 4.6 10*3/uL — ABNORMAL HIGH (ref 0.7–4.0)
MCH: 27.1 pg (ref 26.0–34.0)
MCHC: 29.4 g/dL — AB (ref 30.0–36.0)
MCV: 92.1 fL (ref 78.0–100.0)
MONO ABS: 2.6 10*3/uL — AB (ref 0.1–1.0)
MONOS PCT: 14 %
NEUTROS ABS: 10.4 10*3/uL — AB (ref 1.7–7.7)
Neutrophils Relative %: 57 %
PLATELETS: 337 10*3/uL (ref 150–400)
RBC: 4.06 MIL/uL — ABNORMAL LOW (ref 4.22–5.81)
RDW: 15.5 % (ref 11.5–15.5)
WBC: 18.3 10*3/uL — ABNORMAL HIGH (ref 4.0–10.5)

## 2016-06-02 LAB — I-STAT TROPONIN, ED: TROPONIN I, POC: 0.05 ng/mL (ref 0.00–0.08)

## 2016-06-02 LAB — CBC
HEMATOCRIT: 33.1 % — AB (ref 39.0–52.0)
HEMOGLOBIN: 10.2 g/dL — AB (ref 13.0–17.0)
MCH: 25.8 pg — ABNORMAL LOW (ref 26.0–34.0)
MCHC: 30.8 g/dL (ref 30.0–36.0)
MCV: 83.8 fL (ref 78.0–100.0)
Platelets: 343 10*3/uL (ref 150–400)
RBC: 3.95 MIL/uL — ABNORMAL LOW (ref 4.22–5.81)
RDW: 15.4 % (ref 11.5–15.5)
WBC: 9.5 10*3/uL (ref 4.0–10.5)

## 2016-06-02 LAB — CREATININE, SERUM: CREATININE: 0.91 mg/dL (ref 0.61–1.24)

## 2016-06-02 LAB — INFLUENZA PANEL BY PCR (TYPE A & B)
INFLAPCR: NEGATIVE
INFLBPCR: NEGATIVE

## 2016-06-02 LAB — LACTIC ACID, PLASMA: LACTIC ACID, VENOUS: 1.2 mmol/L (ref 0.5–1.9)

## 2016-06-02 LAB — STREP PNEUMONIAE URINARY ANTIGEN: STREP PNEUMO URINARY ANTIGEN: NEGATIVE

## 2016-06-02 MED ORDER — DEXTROSE 5 % IV SOLN
500.0000 mg | INTRAVENOUS | Status: DC
  Administered 2016-06-02 – 2016-06-04 (×3): 500 mg via INTRAVENOUS
  Filled 2016-06-02 (×5): qty 500

## 2016-06-02 MED ORDER — HYDRALAZINE HCL 20 MG/ML IJ SOLN: 5.0000 mg | INTRAMUSCULAR | Status: DC | PRN

## 2016-06-02 MED ORDER — IPRATROPIUM-ALBUTEROL 0.5-2.5 (3) MG/3ML IN SOLN
3.0000 mL | RESPIRATORY_TRACT | Status: DC
  Administered 2016-06-02 (×2): 3 mL via RESPIRATORY_TRACT
  Filled 2016-06-02 (×2): qty 3

## 2016-06-02 MED ORDER — SODIUM CHLORIDE 0.9 % IV SOLN
INTRAVENOUS | Status: AC
  Administered 2016-06-02: 12:00:00 via INTRAVENOUS

## 2016-06-02 MED ORDER — IPRATROPIUM-ALBUTEROL 0.5-2.5 (3) MG/3ML IN SOLN
RESPIRATORY_TRACT | Status: AC
  Filled 2016-06-02: qty 3

## 2016-06-02 MED ORDER — ORAL CARE MOUTH RINSE
15.0000 mL | Freq: Two times a day (BID) | OROMUCOSAL | Status: DC
  Administered 2016-06-03 – 2016-06-04 (×2): 15 mL via OROMUCOSAL

## 2016-06-02 MED ORDER — NITROGLYCERIN 0.4 MG SL SUBL
0.4000 mg | SUBLINGUAL_TABLET | SUBLINGUAL | Status: DC | PRN
Start: 2016-06-02 — End: 2016-06-05

## 2016-06-02 MED ORDER — DEXTROSE 5 % IV SOLN
500.0000 mg | Freq: Once | INTRAVENOUS | Status: AC
  Administered 2016-06-02: 500 mg via INTRAVENOUS
  Filled 2016-06-02: qty 500

## 2016-06-02 MED ORDER — POTASSIUM CHLORIDE CRYS ER 20 MEQ PO TBCR
20.0000 meq | EXTENDED_RELEASE_TABLET | Freq: Three times a day (TID) | ORAL | Status: AC
  Administered 2016-06-02 – 2016-06-03 (×3): 20 meq via ORAL
  Filled 2016-06-02 (×3): qty 1

## 2016-06-02 MED ORDER — MAGNESIUM SULFATE 2 GM/50ML IV SOLN
2.0000 g | Freq: Once | INTRAVENOUS | Status: AC
  Administered 2016-06-02: 2 g via INTRAVENOUS
  Filled 2016-06-02: qty 50

## 2016-06-02 MED ORDER — DM-GUAIFENESIN ER 30-600 MG PO TB12
1.0000 | ORAL_TABLET | Freq: Two times a day (BID) | ORAL | Status: DC
  Administered 2016-06-02 – 2016-06-05 (×7): 1 via ORAL
  Filled 2016-06-02 (×8): qty 1

## 2016-06-02 MED ORDER — CLOPIDOGREL BISULFATE 75 MG PO TABS
75.0000 mg | ORAL_TABLET | Freq: Every day | ORAL | Status: DC
  Administered 2016-06-02 – 2016-06-05 (×5): 75 mg via ORAL
  Filled 2016-06-02 (×4): qty 1

## 2016-06-02 MED ORDER — DEXTROSE 5 % IV SOLN
1.0000 g | Freq: Once | INTRAVENOUS | Status: AC
  Administered 2016-06-02: 1 g via INTRAVENOUS
  Filled 2016-06-02: qty 10

## 2016-06-02 MED ORDER — ISOSORBIDE MONONITRATE ER 30 MG PO TB24
15.0000 mg | ORAL_TABLET | Freq: Every day | ORAL | Status: DC
  Administered 2016-06-02 – 2016-06-05 (×4): 15 mg via ORAL
  Filled 2016-06-02 (×4): qty 1

## 2016-06-02 MED ORDER — CLONAZEPAM 0.5 MG PO TABS
0.5000 mg | ORAL_TABLET | Freq: Two times a day (BID) | ORAL | Status: DC | PRN
  Administered 2016-06-02 – 2016-06-05 (×7): 0.5 mg via ORAL
  Filled 2016-06-02 (×7): qty 1

## 2016-06-02 MED ORDER — PANTOPRAZOLE SODIUM 40 MG PO TBEC
40.0000 mg | DELAYED_RELEASE_TABLET | Freq: Every day | ORAL | Status: DC
  Administered 2016-06-02 – 2016-06-05 (×4): 40 mg via ORAL
  Filled 2016-06-02 (×4): qty 1

## 2016-06-02 MED ORDER — AMLODIPINE BESYLATE 5 MG PO TABS
5.0000 mg | ORAL_TABLET | Freq: Every day | ORAL | Status: DC
  Administered 2016-06-02 – 2016-06-05 (×4): 5 mg via ORAL
  Filled 2016-06-02 (×3): qty 1

## 2016-06-02 MED ORDER — DEXTROSE 5 % IV SOLN
1.0000 g | INTRAVENOUS | Status: DC
  Administered 2016-06-02 – 2016-06-04 (×3): 1 g via INTRAVENOUS
  Filled 2016-06-02 (×4): qty 10

## 2016-06-02 MED ORDER — ATORVASTATIN CALCIUM 40 MG PO TABS
40.0000 mg | ORAL_TABLET | Freq: Every day | ORAL | Status: DC
  Administered 2016-06-02 – 2016-06-04 (×4): 40 mg via ORAL
  Filled 2016-06-02 (×4): qty 1

## 2016-06-02 MED ORDER — IPRATROPIUM-ALBUTEROL 0.5-2.5 (3) MG/3ML IN SOLN
3.0000 mL | Freq: Four times a day (QID) | RESPIRATORY_TRACT | Status: DC
  Administered 2016-06-02 – 2016-06-03 (×4): 3 mL via RESPIRATORY_TRACT
  Filled 2016-06-02 (×4): qty 3

## 2016-06-02 MED ORDER — KETOROLAC TROMETHAMINE 30 MG/ML IJ SOLN
30.0000 mg | Freq: Once | INTRAMUSCULAR | Status: AC
  Administered 2016-06-02: 30 mg via INTRAVENOUS
  Filled 2016-06-02: qty 1

## 2016-06-02 MED ORDER — MOMETASONE FURO-FORMOTEROL FUM 200-5 MCG/ACT IN AERO
2.0000 | INHALATION_SPRAY | Freq: Two times a day (BID) | RESPIRATORY_TRACT | Status: DC
Start: 2016-06-02 — End: 2016-06-05
  Administered 2016-06-02 – 2016-06-05 (×6): 2 via RESPIRATORY_TRACT
  Filled 2016-06-02: qty 8.8

## 2016-06-02 MED ORDER — ENOXAPARIN SODIUM 40 MG/0.4ML ~~LOC~~ SOLN
40.0000 mg | SUBCUTANEOUS | Status: DC
  Administered 2016-06-02 – 2016-06-04 (×3): 40 mg via SUBCUTANEOUS
  Filled 2016-06-02 (×3): qty 0.4

## 2016-06-02 MED ORDER — ENSURE ENLIVE PO LIQD: 237.0000 mL | Freq: Two times a day (BID) | ORAL | Status: DC

## 2016-06-02 MED ORDER — CHLORHEXIDINE GLUCONATE 0.12 % MT SOLN
15.0000 mL | Freq: Two times a day (BID) | OROMUCOSAL | Status: DC
  Administered 2016-06-02 – 2016-06-05 (×5): 15 mL via OROMUCOSAL
  Filled 2016-06-02 (×6): qty 15

## 2016-06-02 MED ORDER — SERTRALINE HCL 100 MG PO TABS
100.0000 mg | ORAL_TABLET | Freq: Every day | ORAL | Status: DC
  Administered 2016-06-02 – 2016-06-05 (×4): 100 mg via ORAL
  Filled 2016-06-02 (×4): qty 1

## 2016-06-02 MED ORDER — ENSURE ENLIVE PO LIQD
237.0000 mL | Freq: Three times a day (TID) | ORAL | Status: DC
  Administered 2016-06-02: 18:00:00 via ORAL
  Administered 2016-06-02 – 2016-06-05 (×7): 237 mL via ORAL

## 2016-06-02 MED ORDER — TRAMADOL HCL 50 MG PO TABS
50.0000 mg | ORAL_TABLET | Freq: Two times a day (BID) | ORAL | Status: DC | PRN
  Administered 2016-06-03 (×2): 50 mg via ORAL
  Filled 2016-06-02 (×2): qty 1

## 2016-06-02 MED ORDER — ALBUTEROL (5 MG/ML) CONTINUOUS INHALATION SOLN
10.0000 mg/h | INHALATION_SOLUTION | Freq: Once | RESPIRATORY_TRACT | Status: AC
  Administered 2016-06-02: 10 mg/h via RESPIRATORY_TRACT
  Filled 2016-06-02: qty 20

## 2016-06-02 MED ORDER — TAMSULOSIN HCL 0.4 MG PO CAPS
0.4000 mg | ORAL_CAPSULE | Freq: Every day | ORAL | Status: DC
  Administered 2016-06-02 – 2016-06-04 (×4): 0.4 mg via ORAL
  Filled 2016-06-02 (×4): qty 1

## 2016-06-02 MED ORDER — MOMETASONE FURO-FORMOTEROL FUM 200-5 MCG/ACT IN AERO
2.0000 | INHALATION_SPRAY | Freq: Two times a day (BID) | RESPIRATORY_TRACT | Status: DC
  Filled 2016-06-02: qty 8.8

## 2016-06-02 NOTE — Progress Notes (Signed)
This is a no charge note  Pending admission per Dr. Randal Buba  61 year old male with past medical history COPD, CAD, spontaneously pneumothorax, tobacco abuse, depression, who presents with cough, shortness of breath and wheezing for several days, which has been progressively getting worse. Chest x-ray showed left middle lobe infiltration. BNP 189, WBC 18.3, tachycardia, tachypnea, O2 stat 93% on room air, potassium 3.2, creatinine normal. Patient is started with BiPAP. Pt is accepted to SDU as inpt. IV Rocephin and azithromycin were started.  Ivor Costa, MD  [facility] Pager [phone]  If 7PM-7AM, please contact night-coverage [email or web address] Password [facility] 06/02/2016, 2:48 AM

## 2016-06-02 NOTE — ED Notes (Signed)
X-Ray at bedside.

## 2016-06-02 NOTE — ED Provider Notes (Signed)
[facility] DEPT Provider Note   CSN: [id number] Arrival date & time: 06/02/16  0003   By signing my name below, I, Julien Nordmann, attest that this documentation has been prepared under the direction and in the presence of Adelae Yodice, MD.  Electronically Signed: Julien Nordmann, ED Scribe. 06/02/16. 12:44 AM.   History   Chief Complaint Chief Complaint  Patient presents with  . Shortness of Breath   The history is provided by the patient and the EMS personnel. The history is limited by the condition of the patient. No language interpreter was used.  Shortness of Breath  This is a recurrent problem. The problem occurs continuously.The current episode started more than 2 days ago. The problem has been gradually worsening. Associated symptoms include wheezing. Pertinent negatives include no fever, no rhinorrhea, no sore throat, no chest pain, no vomiting and no leg swelling. It is unknown what precipitated the problem. Risk factors include smoking. He has tried beta-agonist inhalers for the symptoms. The treatment provided no relief. He has had no prior hospitalizations. He has had no prior ED visits. He has had no prior ICU admissions. Associated medical issues include COPD, pneumonia and past MI. Associated medical issues do not include recent surgery.   HPI Comments: Philip Coleman is a 61 y.o. male brought in by ambulance, who has a PMhx of COPD, cough, Hodgkin's disease, NSTEMI, hyperthyroidism presents to the Emergency Department complaining of moderate, gradual worsening shortness of breath x a few days that became progressively worse tonight. Per EMS, pt received 2 125 mg of solumedrol and 2 duonebs. They note pt's symptoms were not not alleviating so they placed him on a CPAP. Pt says that the CPAP helped with his symptoms minimally. Pt is on 2 L/min [place] of O2 chronically. He has a frequent hx of severe COPD exacerbations per chart review. Pt is a smoker.  Past Medical History:    Diagnosis Date  . Anxiety state, unspecified   . Chronic airway obstruction, not elsewhere classified   . Chronic respiratory failure ([place])   . COPD (chronic obstructive pulmonary disease) ([place])   . Cough   . Depressive disorder, not elsewhere classified   . Diarrhea   . Headache(784.0)   . Hodgkin's disease 1993   with abdominal surgical scar -pt unable to describe   . Lumbago   . NSTEMI (non-ST elevated myocardial infarction) ([place])   . Other dyspnea and respiratory abnormality   . Other malaise and fatigue   . Pneumonia   . Spontaneous pneumothorax    left  . Spontaneous pneumothorax    right  . Thyroid disease    hyper, not on meds  . Unspecified constipation   . Weight loss     Patient Active Problem List   Diagnosis Date Noted  . Orthostatic hypotension 03/06/2014  . NSTEMI (non-ST elevated myocardial infarction) ([place])   . Systolic CHF, acute ([place]) 11/21/2013  . Erectile dysfunction 09/11/2013  . Esophageal reflux 07/03/2013  . Hyperlipidemia 06/12/2013  . Heartburn 05/23/2013  . Pain in joint, shoulder region 02/13/2013  . CAD in native artery 09/24/2012  . Chronic respiratory failure ([place]) 04/01/2012  . Protein-calorie malnutrition, severe ([place]) 01/23/2012  . Tobacco abuse 12/24/2011  . Hyponatremia 09/20/2011  . 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 ETOH abuse 09/16/2011    Past Surgical History:  Procedure Laterality Date  . CHEST TUBE INSERTION     left  . CHEST TUBE INSERTION     right  .  LEFT HEART CATHETERIZATION WITH CORONARY ANGIOGRAM N/A 11/21/2013   Procedure: LEFT HEART CATHETERIZATION WITH CORONARY ANGIOGRAM;  Surgeon: Peter M Martinique, MD;  Location: [facility] CATH LAB;  Service: Cardiovascular;  Laterality: N/A;  . PERCUTANEOUS CORONARY INTERVENTION-BALLOON ONLY  11/22/2013   Procedure: PERCUTANEOUS CORONARY INTERVENTION-BALLOON ONLY;  Surgeon: Jettie Booze, MD;  Location[facility] C[facility] LAB;  Service: Cardiovascular;;  CFX  . PERCUTANEOUS CORONARY  STENT INTERVENTION (PCI-S) N/A 11/22/2013   Procedure: PERCUTANEOUS CORONARY STENT INTERVENTION (PCI-S);  Surgeon: Jettie Booze, MD;  Location: [facility] CATH LAB;  Service: Cardiovascular;  Laterality: N/A;  LAD  . SPLENECTOMY  1987  . teeth extracted for dentures  2012       Home Medications    Prior to Admission medications   Medication Sig Start Date End Date Taking? Authorizing Provider  albuterol (PROVENTIL HFA) 108 (90 BASE) MCG/ACT inhaler INHALE 2 PUFFS INTO THE LUNGS EVERY 6 HOURS AS NEEDED FOR WHEEZING 12/25/14   Chesley Mires, MD  albuterol (PROVENTIL) (2.5 MG/3ML) 0.083% nebulizer solution Take 3 mLs (2.5 mg total) by nebulization every 6 (six) hours as needed for wheezing or shortness of breath. 07/27/15   Chesley Mires, MD  albuterol (PROVENTIL) (2.5 MG/3ML) 0.083% nebulizer solution TAKE THREE   MLS (2.5 MG TOTAL) BY NEBULIZATION EVERY 6 (SIX) HOURS AS NEEDED FOR WHEEZING OR SHORTNESS OF BREATH. 09/28/15   Chesley Mires, MD  atorvastatin (LIPITOR) 40 MG tablet TAKE 1 TABLET (40 MG TOTAL) BY MOUTH DAILY AT 6 PM. PLEASE KEEP YOUR 9/5 APPOINTMENT FOR FUTHER REFILLS 02/04/16   Jettie Booze, MD  clonazePAM (KLONOPIN) 0.5 MG tablet TAKE 1 TABLET BY MOUTH TWICE A DAY AS NEEDED 01/15/16   Historical Provider, MD  clopidogrel (PLAVIX) 75 MG tablet TAKE 1 TABLET (75 MG TOTAL) BY MOUTH DAILY. 07/27/15   Jettie Booze, MD  Ipratropium-Albuterol (COMBIVENT RESPIMAT) 20-100 MCG/ACT AERS respimat INHALE 1 PUFF INTO THE LUNGS EVERY 6 (SIX) HOURS AS NEEDED FOR WHEEZING OR SHORTNESS OF BREATH. 09/23/15   Chesley Mires, MD  isosorbide mononitrate (IMDUR) 30 MG 24 hr tablet Take 0.5 tablets (15 mg total) by mouth daily. 01/25/16 04/24/16  Jettie Booze, MD  nitroGLYCERIN (NITROSTAT) 0.4 MG SL tablet Place 1 tablet (0.4 mg total) under the tongue every 5 (five) minutes as needed for chest pain. 01/25/16   Jettie Booze, MD  pantoprazole (PROTONIX) 40 MG tablet Take 1 tablet (40 mg total) by  mouth daily. 03/06/14   Jettie Booze, MD  sertraline (ZOLOFT) 100 MG tablet TAKE ONE TABLET BY MOUTH DAILY 01/15/16   Historical Provider, MD  SYMBICORT 160-4.5 MCG/ACT inhaler INHALE 2 PUFF INTO THE LUNGS TWICE DAILY 04/19/16   Chesley Mires, MD  tamsulosin (FLOMAX) 0.4 MG CAPS capsule Take 0.4 mg by mouth daily after supper.  12/14/15 12/13/16  Historical Provider, MD  traMADol (ULTRAM) 50 MG tablet TAKE TWO TABLETS   BY MOUTH   EVERY 12 HOURS   AS NEEDED   FOR PAIN 07/09/14   Lauree Chandler, NP  VENTOLIN HFA 108 (90 BASE) MCG/ACT inhaler INHALE TWO   PUFFS INTO THE LUNGS EVERY 6 HOURS AS NEEDED 12/25/14   Chesley Mires, MD    Family History Family History  Problem Relation Age of Onset  . Cancer Mother 8  . Hyperlipidemia Mother   . Diabetes Mother   . Pneumonia Father   . Cancer Sister   . Stroke Sister   . Heart attack Neg Hx  Social History Social History  Substance Use Topics  . Smoking status: Current Every Day Smoker    Packs/day: 0.25    Years: 41.00    Types: Cigarettes  . Smokeless tobacco: Current User     Comment: nothinhg will work. Tried everything 09/24/12--5 cigs per day  . Alcohol use 3.6 oz/week    6 Cans of beer per week     Comment: socially     Allergies   Codeine   Review of Systems Review of Systems  Constitutional: Negative for fever.  HENT: Negative for rhinorrhea and sore throat.   Respiratory: Positive for shortness of breath and wheezing.   Cardiovascular: Negative for chest pain and leg swelling.  Gastrointestinal: Negative for vomiting.  All other systems reviewed and are negative.    Physical Exam Updated Vital Signs BP 136/76 (BP Location: Left Arm)   Pulse 109   Resp 20   SpO2 95%   Physical Exam  Constitutional: He is oriented to person, place, and time. He appears well-developed. He appears distressed.  Chronically ill appearing  HENT:  Head: Normocephalic and atraumatic.  Mouth/Throat: Oropharynx is clear and moist.  No oropharyngeal exudate.  Moist mucous membranes. No exudates.   Eyes: Conjunctivae and EOM are normal. Pupils are equal, round, and reactive to light.  Neck: Normal range of motion. Neck supple. No JVD present. No tracheal deviation present.  No carotid bruits. Trachea midline. using excessory muscles and scalings in neck  Cardiovascular: Normal rate, regular rhythm, normal heart sounds and intact distal pulses.  Exam reveals no gallop and no friction rub.   No murmur heard. RRR.   Pulmonary/Chest: No stridor. He is in respiratory distress. He has no wheezes. He has no rales.  tight, tachypneic, no wheezes rales or rhonchi  Abdominal: Soft. Bowel sounds are normal. He exhibits no distension. There is no rebound and no guarding.  Musculoskeletal: Normal range of motion. He exhibits no edema or deformity.  clamped down peripherally but intact distal pulses, DTRs intact  Lymphadenopathy:    He has no cervical adenopathy.  Neurological: He is alert and oriented to person, place, and time. He has normal reflexes. He displays normal reflexes.  Skin: Skin is warm and dry. Capillary refill takes less than 2 seconds.  Psychiatric: He has a normal mood and affect.  Nursing note and vitals reviewed.    ED Treatments / Results  DIAGNOSTIC STUDIES: Oxygen Saturation is 95% on 8 L/min [place] , adequate by my interpretation.  COORDINATION OF CARE:  12:32 AM Discussed treatment plan with pt at bedside and pt agreed to plan.  Labs (all labs ordered are listed, but only abnormal  Results for orders placed or performed during the hospital encounter of 06/02/16  CBC with Differential/Platelet  Result Value Ref Range   WBC 18.3 (H) 4.0 - 10.5 K/uL   RBC 4.06 (L) 4.22 - 5.81 MIL/uL   Hemoglobin 11.0 (L) 13.0 - 17.0 g/dL   HCT 37.4 (L) 39.0 - 52.0 %   MCV 92.1 78.0 - 100.0 fL   MCH 27.1 26.0 - 34.0 pg   MCHC 29.4 (L) 30.0 - 36.0 g/dL   RDW 15.5 11.5 - 15.5 %   Platelets 337 150 - 400 K/uL    Neutrophils Relative % 57 %   Neutro Abs 10.4 (H) 1.7 - 7.7 K/uL   Lymphocytes Relative 25 %   Lymphs Abs 4.6 (H) 0.7 - 4.0 K/uL   Monocytes Relative 14 %   Monocytes Absolute  2.6 (H) 0.1 - 1.0 K/uL   Eosinophils Relative 4 %   Eosinophils Absolute 0.7 0.0 - 0.7 K/uL   Basophils Relative 0 %   Basophils Absolute 0.1 0.0 - 0.1 K/uL  Brain natriuretic peptide  Result Value Ref Range   B Natriuretic Peptide 189.1 (H) 0.0 - 100.0 pg/mL  I-stat chem 8, ed  Result Value Ref Range   Sodium 135 135 - 145 mmol/L   Potassium 3.2 (L) 3.5 - 5.1 mmol/L   Chloride 94 (L) 101 - 111 mmol/L   BUN 10 6 - 20 mg/dL   Creatinine, Ser 1.00 0.61 - 1.24 mg/dL   Glucose, Bld 129 (H) 65 - 99 mg/dL   Calcium, Ion 1.03 (L) 1.15 - 1.40 mmol/L   TCO2 33 0 - 100 mmol/L   Hemoglobin 11.9 (L) 13.0 - 17.0 g/dL   HCT 35.0 (L) 39.0 - 52.0 %  I-stat troponin, ED  Result Value Ref Range   Troponin i, poc 0.05 0.00 - 0.08 ng/mL   Comment 3           Dg Chest Portable 1 View  Result Date: 06/02/2016 CLINICAL DATA:  Acute onset of shortness of breath and wheezing. Initial encounter. EXAM: PORTABLE CHEST 1 VIEW COMPARISON:  Chest radiograph performed 11/26/2013 FINDINGS: The lungs are well-aerated. Vascular congestion is noted. Left midlung opacity may reflect mild pneumonia. Underlying emphysema is noted bilaterally. Chronic bibasilar pleural thickening is noted. There is no evidence of significant pleural effusion or pneumothorax. The cardiomediastinal silhouette is within normal limits. No acute osseous abnormalities are seen. IMPRESSION: Vascular congestion noted. Left midlung opacity may reflect mild pneumonia, depending on the patient's symptoms. Underlying bilateral emphysema noted. Electronically Signed   By: Garald Balding M.D.   On: 06/02/2016 00:39    Procedures Procedures (including critical care time)  Medications Ordered in ED  Medications  albuterol (PROVENTIL,VENTOLIN) solution continuous neb (not  administered)  ketorolac (TORADOL) 30 MG/ML injection 30 mg (not administered)  ipratropium-albuterol (DUONEB) 0.5-2.5 (3) MG/3ML nebulizer solution 3 mL (not administered)  dextromethorphan-guaiFENesin (MUCINEX DM) 30-600 MG per 12 hr tablet 1 tablet (not administered)  magnesium sulfate IVPB 2 g 50 mL (0 g Intravenous Stopped 06/02/16 0137)  cefTRIAXone (ROCEPHIN) 1 g in dextrose 5 % 50 mL IVPB (0 g Intravenous Stopped 06/02/16 0137)  azithromycin (ZITHROMAX) 500 mg in dextrose 5 % 250 mL IVPB (500 mg Intravenous New Bag/Given 06/02/16 0134)   Started on BIPAP in the ED MDM Reviewed: previous chart, nursing note and vitals Reviewed previous: labs and ECG Interpretation: labs, ECG and x-ray (Elevated WBC count negative troponin by me and PNA by me on CXR with COPD.  ) Total time providing critical care: > 105 minutes. This excludes time spent performing separately reportable procedures and services. Consults: admitting MD  CRITICAL CARE Performed by: Carlisle Beers Total critical care time: 120 minutes Critical care time was exclusive of separately billable procedures and treating other patients. Critical care was necessary to treat or prevent imminent or life-threatening deterioration. Critical care was time spent personally by me on the following activities: development of treatment plan with patient and/or surrogate as well as nursing, discussions with consultants, evaluation of patient's response to treatment, examination of patient, obtaining history from patient or surrogate, ordering and performing treatments and interventions, ordering and review of laboratory studies, ordering and review of radiographic studies, pulse oximetry and re-evaluation of patient's condition.  2:45 AM discussed with Dr. Blaine Hamper, pt will be admitted to stepdown unit.  Final Clinical Impressions(s) / ED Diagnoses  PNA/COPD: Will need admission to stepdown as BIPAP is new for the patient.  Magnesium was  ordered.    I personally performed the services described in this documentation, which was scribed in my presence. The recorded information has been reviewed and is accurate.      Veatrice Kells, MD 06/02/16 [phone]

## 2016-06-02 NOTE — ED Triage Notes (Signed)
Pt BIB EMS from home for SOB that began 2-3 days ago; wheezing audible; CPAP applied by EMS; 10mg  Albuterol and 125 mg solumedrol given in total; hx of COPD, MI, pneumothorax; pt on continuous 2L [place] at home; pt is still a smoker

## 2016-06-02 NOTE — H&P (Addendum)
History and Physical:    Philip Coleman   [id number] DOB: 02/23/1956 DOA: 06/02/2016  Referring MD/provider: April Palumbo, MD PCP: Berkley Harvey, NP   Patient coming from: Home  Chief Complaint: Worsening dyspnea  History of Present Illness:   Philip Coleman is an 61 y.o. male with a PMH of chronic respiratory failure on 3-4 liters of home oxygen secondary to COPD with ongoing tobacco abuse who presents today for evaluation of a 2 day history of worsening dyspnea accompanied by weakness culminating in a fall in his bathroom yesterday. Patient tells me that he typically will double up on his bronchodilator treatments when he gets more dyspneic than usual, but when he tried that this time, he had no relief. He reports that he has had his flu vaccination and his pneumonia vaccination, and has not had any sick contacts. No frank fever or chills but he does have a cough productive of clear sputum.  ED Course:  The patient developed significant hypoxia with pulse ox saturations in the mid 50s necessitating initiation of BiPAP transiently in the night. BiPAP was weaned early this morning and he currently is on his usual 3-4 liters of oxygen via nasal cannula. Chest x-ray showed vascular congestion with a left midlung opacity concerning for pneumonia with changes of bilateral emphysema. Labs were significant for a WBC of 18.3, and elevated BNP at 189.1, an ABG on BiPAP showing a PCO2 of 51.3, negative influenza screen, and a lactic acid of 1.2. He was given a dose of Rocephin and azithromycin and referred for admission.  ROS:   Review of Systems  Constitutional: Positive for malaise/fatigue and weight loss. Negative for chills and fever.       40 lb weight loss over the past 2 years.  HENT: Positive for congestion, sinus pain and sore throat.   Eyes: Negative.   Respiratory: Positive for cough, sputum production and shortness of breath.   Cardiovascular: Negative for chest pain and  palpitations.  Gastrointestinal: Positive for diarrhea. Negative for abdominal pain, blood in stool, constipation, melena, nausea and vomiting.  Genitourinary: Positive for frequency.       Flomax has improved.  Musculoskeletal: Positive for falls.  Skin: Negative.   Neurological: Positive for dizziness, weakness and headaches.  Psychiatric/Behavioral: Positive for depression. The patient is nervous/anxious.     All other systems were reviewed are are negative. Past Medical History:   Past Medical History:  Diagnosis Date  . Anxiety state, unspecified   . Chronic airway obstruction, not elsewhere classified   . Chronic respiratory failure ([place])   . COPD (chronic obstructive pulmonary disease) ([place])   . Cough   . Depressive disorder, not elsewhere classified   . Diarrhea   . Headache(784.0)   . Hodgkin's disease 1993   with abdominal surgical scar -pt unable to describe   . Lumbago   . NSTEMI (non-ST elevated myocardial infarction) ([place])   . Other dyspnea and respiratory abnormality   . Other malaise and fatigue   . Pneumonia   . Spontaneous pneumothorax    left  . Spontaneous pneumothorax    right  . Thyroid disease    hyper, not on meds  . Unspecified constipation   . Weight loss     Past Surgical History:   Past Surgical History:  Procedure Laterality Date  . CHEST TUBE INSERTION     left  . CHEST TUBE INSERTION     right  . LEFT HEART CATHETERIZATION WITH  CORONARY ANGIOGRAM N/A 11/21/2013   Procedure: LEFT HEART CATHETERIZATION WITH CORONARY ANGIOGRAM;  Surgeon: Peter M Martinique, MD;  Location: [facility] CATH LAB;  Service: Cardiovascular;  Laterality: N/A;  . PERCUTANEOUS CORONARY INTERVENTION-BALLOON ONLY  11/22/2013   Procedure: PERCUTANEOUS CORONARY INTERVENTION-BALLOON ONLY;  Surgeon: Jettie Booze, MD;  Location: [facility] CATH LAB;  Service: Cardiovascular;;  CFX  . PERCUTANEOUS CORONARY STENT INTERVENTION (PCI-S) N/A 11/22/2013   Procedure: PERCUTANEOUS CORONARY  STENT INTERVENTION (PCI-S);  Surgeon: Jettie Booze, MD;  Location: [facility] CATH LAB;  Service: Cardiovascular;  Laterality: N/A;  LAD  . SPLENECTOMY  1987  . teeth extracted for dentures  2012    Social History:   Social History   Social History  . Marital status: Legally Separated    Spouse name: N/A  . Number of children: N/A  . Years of education: N/A   Occupational History  . Disabled    Social History Main Topics  . Smoking status: Current Every Day Smoker    Packs/day: 0.25    Years: 41.00    Types: Cigarettes  . Smokeless tobacco: Current User     Comment: nothinhg will work. Tried everything 09/24/12--5 cigs per day  . Alcohol use 3.6 oz/week    6 Cans of beer per week     Comment: socially  . Drug use: No  . Sexual activity: Yes    Birth control/ protection: Condom     Comment: perfer not to say    Other Topics Concern  . Not on file   Social History Narrative   Lives with 2 male friends.      Allergies   Codeine  Family history:   Family History  Problem Relation Age of Onset  . Cancer Mother 44  . Hyperlipidemia Mother   . Diabetes Mother   . Pneumonia Father   . Cancer Sister   . Stroke Sister   . Heart attack Neg Hx     Current Medications:   Prior to Admission medications   Medication Sig Start Date End Date Taking? Authorizing Provider  albuterol (PROVENTIL) (2.5 MG/3ML) 0.083% nebulizer solution TAKE THREE   MLS (2.5 MG TOTAL) BY NEBULIZATION EVERY 6 (SIX) HOURS AS NEEDED FOR WHEEZING OR SHORTNESS OF BREATH. 09/28/15  Yes Chesley Mires, MD  atorvastatin (LIPITOR) 40 MG tablet TAKE 1 TABLET (40 MG TOTAL) BY MOUTH DAILY AT 6 PM. PLEASE KEEP YOUR 9/5 APPOINTMENT FOR FUTHER REFILLS 02/04/16  Yes Jettie Booze, MD  clonazePAM (KLONOPIN) 0.5 MG tablet TAKE 1 TABLET BY MOUTH TWICE A DAY AS NEEDED anxiety 01/15/16  Yes Historical Provider, MD  clopidogrel (PLAVIX) 75 MG tablet TAKE 1 TABLET (75 MG TOTAL) BY MOUTH DAILY. 07/27/15  Yes Jettie Booze, MD  Ipratropium-Albuterol (COMBIVENT RESPIMAT) 20-100 MCG/ACT AERS respimat INHALE 1 PUFF INTO THE LUNGS EVERY 6 (SIX) HOURS AS NEEDED FOR WHEEZING OR SHORTNESS OF BREATH. 09/23/15  Yes Chesley Mires, MD  isosorbide mononitrate (IMDUR) 30 MG 24 hr tablet Take 0.5 tablets (15 mg total) by mouth daily. 01/25/16 06/02/16 Yes Jettie Booze, MD  nitroGLYCERIN (NITROSTAT) 0.4 MG SL tablet Place 1 tablet (0.4 mg total) under the tongue every 5 (five) minutes as needed for chest pain. 01/25/16  Yes Jettie Booze, MD  pantoprazole (PROTONIX) 40 MG tablet Take 1 tablet (40 mg total) by mouth daily. 03/06/14  Yes Jettie Booze, MD  sertraline (ZOLOFT) 100 MG tablet TAKE ONE TABLET BY MOUTH DAILY 01/15/16  Yes Historical Provider, MD  SYMBICORT 160-4.5 MCG/ACT inhaler INHALE 2 PUFF INTO THE LUNGS TWICE DAILY 04/19/16  Yes Chesley Mires, MD  tamsulosin (FLOMAX) 0.4 MG CAPS capsule Take 0.4 mg by mouth daily after supper.  12/14/15 12/13/16 Yes Historical Provider, MD  tiotropium (SPIRIVA) 18 MCG inhalation capsule Place 18 mcg into inhaler and inhale daily.   Yes Historical Provider, MD  traMADol (ULTRAM) 50 MG tablet TAKE TWO TABLETS   BY MOUTH   EVERY 12 HOURS   AS NEEDED   FOR PAIN 07/09/14  Yes Lauree Chandler, NP  VENTOLIN HFA 108 (90 BASE) MCG/ACT inhaler INHALE TWO   PUFFS INTO THE LUNGS EVERY 6 HOURS AS NEEDED 12/25/14  Yes Chesley Mires, MD  albuterol (PROVENTIL HFA) 108 (90 BASE) MCG/ACT inhaler INHALE 2 PUFFS INTO THE LUNGS EVERY 6 HOURS AS NEEDED FOR WHEEZING Patient not taking: Reported on 06/02/2016 12/25/14   Chesley Mires, MD  albuterol (PROVENTIL) (2.5 MG/3ML) 0.083% nebulizer solution Take 3 mLs (2.5 mg total) by nebulization every 6 (six) hours as needed for wheezing or shortness of breath. Patient not taking: Reported on 06/02/2016 07/27/15   Chesley Mires, MD    Physical Exam:   Vitals:   06/02/16 0839 06/02/16 0900 06/02/16 0930 06/02/16 1000  BP: 153/89 156/91 155/91 166/88    Pulse: 99 87 90 88  Resp: 21 19 18 18   SpO2: 92% 96% 96% 96%     Physical Exam: Blood pressure 166/88, pulse 88, resp. rate 18, SpO2 96 %. Gen: Cachectic appearing, shivering. Head: Normocephalic, atraumatic. Eyes: Pupils equal, round and reactive to light. Extraocular movements intact.  Sclerae nonicteric. No lid lag. Mouth: Oropharynx reveals mildly dry mucous membranes. Edentulous. Neck: Supple, no thyromegaly, no lymphadenopathy, no jugular venous distention. Chest: Lungs are clear to auscultation with fair air movement. No rales, rhonchi or wheezes.  CV: Heart sounds are regular with an S1, S2. No murmurs, rubs, clicks, or gallops.  Abdomen: Soft, nontender, nondistended with normal active bowel sounds. No hepatosplenomegaly or palpable masses. Extremities: Extremities are without clubbing, edema, or cyanosis. Pedal pulses 2+.  Skin: Warm and dry. No rashes. Hematoma left hand.  Scattered ecchymosis. Neuro: Alert and oriented times 3; grossly nonfocal.  Psych: Insight is good and judgment is appropriate. Mood and affect normal.   Data Review:    Labs: Basic Metabolic Panel:  Recent Labs Lab 06/02/16 0034  NA 135  K 3.2*  CL 94*  GLUCOSE 129*  BUN 10  CREATININE 1.00   Liver Function Tests: No results for input(s): AST, ALT, ALKPHOS, BILITOT, PROT, ALBUMIN in the last 168 hours. No results for input(s): LIPASE, AMYLASE in the last 168 hours. No results for input(s): AMMONIA in the last 168 hours. CBC:  Recent Labs Lab 06/02/16 0023 06/02/16 0034  WBC 18.3*  --   NEUTROABS 10.4*  --   HGB 11.0* 11.9*  HCT 37.4* 35.0*  MCV 92.1  --   PLT 337  --    Cardiac Enzymes: No results for input(s): CKTOTAL, CKMB, CKMBINDEX, TROPONINI in the last 168 hours.  BNP (last 3 results) No results for input(s): PROBNP in the last 8760 hours. CBG: No results for input(s): GLUCAP in the last 168 hours.  Urinalysis    Component Value Date/Time   COLORURINE YELLOW  01/05/2011 0815   APPEARANCEUR CLEAR 01/05/2011 0815   LABSPEC 1.011 01/05/2011 0815   PHURINE 6.5 01/05/2011 0815   GLUCOSEU NEGATIVE 01/05/2011 0815   HGBUR NEGATIVE 01/05/2011 [place] NEGATIVE 01/05/2011 0815  KETONESUR NEGATIVE 01/05/2011 0815   PROTEINUR NEGATIVE 01/05/2011 0815   UROBILINOGEN 0.2 01/05/2011 0815   NITRITE NEGATIVE 01/05/2011 0815   LEUKOCYTESUR NEGATIVE 01/05/2011 0815      Radiographic Studies: Dg Chest Portable 1 View  Result Date: 06/02/2016 CLINICAL DATA:  Acute onset of shortness of breath and wheezing. Initial encounter. EXAM: PORTABLE CHEST 1 VIEW COMPARISON:  Chest radiograph performed 11/26/2013 FINDINGS: The lungs are well-aerated. Vascular congestion is noted. Left midlung opacity may reflect mild pneumonia. Underlying emphysema is noted bilaterally. Chronic bibasilar pleural thickening is noted. There is no evidence of significant pleural effusion or pneumothorax. The cardiomediastinal silhouette is within normal limits. No acute osseous abnormalities are seen. IMPRESSION: Vascular congestion noted. Left midlung opacity may reflect mild pneumonia, depending on the patient's symptoms. Underlying bilateral emphysema noted. Electronically Signed   By: Garald Balding M.D.   On: 06/02/2016 00:39    EKG: Independently reviewed. NSR at 95 bpm with diffuse ST depression in a prolonged QT interval.  Assessment/Plan:   Principal Problem:   Acute on chronic respiratory failure secondary to LOBAR CAP (community acquired pneumonia) in the setting of COPD with emphysema Continue Rocephin/azithromycin. Sputum culture, strep pneumonia antigen, and HIV serologies requested. Continue supplemental oxygen, nebulized bronchodilators, and Symbicort. No current indication for steroids as the patient is not actively wheezing.  Active Problems:   Tobacco abuse Counseled on cessation.    Protein-calorie malnutrition, severe ([place]) We'll obtain a dietitian  consultation. The patient has lost 40 pounds in the past 2 years and is cachectic appearing. Check HIV serologies.    Hyperlipidemia Continue statin.    Esophageal reflux Continue PPI.    Anxiety disorder Continue Klonopin and Zoloft.    History of CAD Continue Plavix, statin, nitroglycerin when necessary.    BPH Continue Flomax.   Elevated BNP/pulmonary vascular congestion on imaging in a patient with grade 1 diastolic dysfunction Patient appears mildly dry on exam. Will gently hydrate 12 hours only. Patient had an echo done on 11/22/13 and his EF was 60-65 percent at that time. Grade 1 diastolic dysfunction was noted.    Weakness/fall PT evaluation requested.   Attestation regarding necessity of inpatient status:   The appropriate admission status for this patient is INPATIENT. Inpatient status is judged to be reasonable and necessary in order to provide the required intensity of service to ensure the patient's safety. The patient's presenting symptoms, physical exam findings, and initial radiographic and laboratory data in the context of their chronic comorbidities is felt to place them at high risk for further clinical deterioration. Furthermore, it is not anticipated that the patient will be medically stable for discharge from the hospital within 2 midnights of admission. The following factors support the admission status of inpatient.    The patient's presenting symptoms include worsening dyspnea, weakness resulting in a fall.  The worrisome physical exam findings include cachexia with fair air movement on lung exam.  The initial radiographic and laboratory data are worrisome because of pulmonary vascular congestion, elevated BNP, hypoxia/hypercarbia, findings concerning for a left sided pneumonia.  The chronic co-morbidities include severe COPD with chronic respiratory failure.   * I certify that at the point of admission it is my clinical judgment that the patient will  require inpatient hospital care spanning beyond 2 midnights from the point of admission due to high intensity of service, high risk for further deterioration and high frequency of surveillance required.*   Other information:   DVT prophylaxis: Lovenox ordered. Code Status: Full  code. Family Communication: Lucila Maine is emergency contact (friend). Disposition Plan: Home in 48-72 hours if respiratory status stable. Consults called: None. Admission status: Inpatient.  The medical decision making on this patient was of high complexity and the patient is at high risk for clinical deterioration, therefore this is a level 3 visit.  RAMA,CHRISTINA [facility] Pager [phone] Cell: [phone]   If 7PM-7AM, please contact night-coverage [email or web address] Password [email or web address] 06/02/2016, 10:26 AM

## 2016-06-02 NOTE — Progress Notes (Addendum)
Initial Nutrition Assessment  DOCUMENTATION CODES:   Severe malnutrition in context of chronic illness, Underweight  INTERVENTION:    Ensure Enlive po TID, each supplement provides 350 kcal and 20 grams of protein  NUTRITION DIAGNOSIS:   Malnutrition related to chronic illness as evidenced by severe depletion of body fat, severe depletion of muscle mass  GOAL:   Patient will meet greater than or equal to 90% of their needs  MONITOR:   PO intake, Supplement acceptance, Labs, Weight trends, I & O's  REASON FOR ASSESSMENT:   Consult Assessment of nutrition requirement/status  ASSESSMENT:   61 yo Male with PMH of COPD, CAD, spontaneously pneumothorax, tobacco abuse, depression, who presents with cough, shortness of breath and wheezing for several days, which has been progressively getting worse. Chest x-ray showed left middle lobe infiltration.  RD unable to obtain nutrition hx from patient >> obtunded. Noted pt with 40 lb weight loss over the past 2 years. PO intake 95% per flowsheet records. Would benefit from addition or oral nutrition supplements >> will order. Labs and medications reviewed.  Nutrition-Focused physical exam completed. Findings are severe fat depletion, severe muscle depletion, and no edema.   Diet Order:  Diet Heart Room service appropriate? Yes; Fluid consistency: Thin  Skin:  Reviewed, no issues  Last BM:  1/24  Height:   Ht Readings from Last 1 Encounters:  06/02/16 5\' 7"  (1.702 m)    Weight:   Wt Readings from Last 1 Encounters:  06/02/16 99 lb 10.4 oz (45.2 kg)    Ideal Body Weight:  67.2 kg  BMI:  Body mass index is 15.61 kg/m.  Estimated Nutritional Needs:   Kcal:  1350-1550  Protein:  70-80 gm  Fluid:  >/= 1.5 L  EDUCATION NEEDS:   No education needs identified at this time  Arthur Holms, RD, LDN Pager #: [phone] After-Hours Pager #: [phone]

## 2016-06-03 DIAGNOSIS — J9622 Acute and chronic respiratory failure with hypercapnia: Secondary | ICD-10-CM

## 2016-06-03 DIAGNOSIS — N401 Enlarged prostate with lower urinary tract symptoms: Secondary | ICD-10-CM

## 2016-06-03 DIAGNOSIS — K219 Gastro-esophageal reflux disease without esophagitis: Secondary | ICD-10-CM

## 2016-06-03 DIAGNOSIS — F411 Generalized anxiety disorder: Secondary | ICD-10-CM

## 2016-06-03 DIAGNOSIS — J181 Lobar pneumonia, unspecified organism: Secondary | ICD-10-CM

## 2016-06-03 DIAGNOSIS — J9621 Acute and chronic respiratory failure with hypoxia: Secondary | ICD-10-CM

## 2016-06-03 DIAGNOSIS — I2510 Atherosclerotic heart disease of native coronary artery without angina pectoris: Secondary | ICD-10-CM

## 2016-06-03 DIAGNOSIS — R350 Frequency of micturition: Secondary | ICD-10-CM

## 2016-06-03 DIAGNOSIS — Z720 Tobacco use: Secondary | ICD-10-CM

## 2016-06-03 DIAGNOSIS — J439 Emphysema, unspecified: Secondary | ICD-10-CM

## 2016-06-03 DIAGNOSIS — E43 Unspecified severe protein-calorie malnutrition: Secondary | ICD-10-CM

## 2016-06-03 LAB — CBC
HEMATOCRIT: 30.4 % — AB (ref 39.0–52.0)
HEMOGLOBIN: 9.4 g/dL — AB (ref 13.0–17.0)
MCH: 26.5 pg (ref 26.0–34.0)
MCHC: 30.9 g/dL (ref 30.0–36.0)
MCV: 85.6 fL (ref 78.0–100.0)
Platelets: 307 10*3/uL (ref 150–400)
RBC: 3.55 MIL/uL — ABNORMAL LOW (ref 4.22–5.81)
RDW: 15.8 % — AB (ref 11.5–15.5)
WBC: 15.6 10*3/uL — ABNORMAL HIGH (ref 4.0–10.5)

## 2016-06-03 LAB — BASIC METABOLIC PANEL
Anion gap: 4 — ABNORMAL LOW (ref 5–15)
BUN: 22 mg/dL — AB (ref 6–20)
CALCIUM: 8.1 mg/dL — AB (ref 8.9–10.3)
CHLORIDE: 99 mmol/L — AB (ref 101–111)
CO2: 33 mmol/L — AB (ref 22–32)
CREATININE: 0.79 mg/dL (ref 0.61–1.24)
GFR calc Af Amer: >60 mL/min (ref >60)
GFR calc non Af Amer: >60 mL/min (ref >60)
GLUCOSE: 112 mg/dL — AB (ref 65–99)
Potassium: 4.1 mmol/L (ref 3.5–5.1)
Sodium: 136 mmol/L (ref 135–145)

## 2016-06-03 LAB — HIV ANTIBODY (ROUTINE TESTING W REFLEX): HIV SCREEN 4TH GENERATION: NONREACTIVE

## 2016-06-03 MED ORDER — TRAMADOL HCL 50 MG PO TABS
50.0000 mg | ORAL_TABLET | Freq: Four times a day (QID) | ORAL | Status: DC | PRN
  Administered 2016-06-03 – 2016-06-05 (×6): 50 mg via ORAL
  Filled 2016-06-03 (×6): qty 1

## 2016-06-03 MED ORDER — IPRATROPIUM-ALBUTEROL 0.5-2.5 (3) MG/3ML IN SOLN
3.0000 mL | Freq: Three times a day (TID) | RESPIRATORY_TRACT | Status: DC
  Administered 2016-06-03 – 2016-06-05 (×6): 3 mL via RESPIRATORY_TRACT
  Filled 2016-06-03 (×6): qty 3

## 2016-06-03 MED ORDER — ALBUTEROL SULFATE (2.5 MG/3ML) 0.083% IN NEBU
2.5000 mg | INHALATION_SOLUTION | RESPIRATORY_TRACT | Status: DC | PRN
  Administered 2016-06-04: 2.5 mg via RESPIRATORY_TRACT

## 2016-06-03 NOTE — Evaluation (Signed)
Physical Therapy Evaluation Patient Details Name: Philip Coleman MRN: [id number] DOB: 1956/03/06 Today's Date: 06/03/2016   History of Present Illness  Philip Coleman is an 61 y.o. male with a PMH of chronic respiratory failure on 3-4 liters of home oxygen secondary to COPD with ongoing tobacco abuse   Chest x-ray showed vascular congestion with a left midlung opacity concerning for pneumonia with changes of bilateral emphysema  Clinical Impression  The patient is very dyspneic, ambulated x 20' with  sats on 4 -6 liters at 88%. The patient is very anxious and trmulous. Instructed in pursed lip breaths. The patient has minimal assistance at home, 7 STE. Pt admitted with above diagnosis. Pt currently with functional limitations due to the deficits listed below (see PT Problem List).  Pt will benefit from skilled PT to increase their independence and safety with mobility to allow discharge to the venue listed below.  .     Follow Up Recommendations Home health PT;Supervision/Assistance - 24 hour (declines SNF)    Equipment Recommendations  None recommended by PT    Recommendations for Other Services       Precautions / Restrictions On oxygen 4 liters. fall      Mobility  Bed Mobility Overal bed mobility: Independent                Transfers Overall transfer level: Needs assistance Equipment used: Rolling walker (2 wheeled) Transfers: Sit to/from Stand Sit to Stand: Supervision            Ambulation/Gait Ambulation/Gait assistance: Min assist Ambulation Distance (Feet): 20 Feet Assistive device: Rolling walker (2 wheeled) Gait Pattern/deviations: Step-to pattern     General Gait Details: patient on 4 -6 liters, sats 88% then back to 94% with Pursd lip breaths. Dyspnea 4/4. could not progress. Increased WOB. Tremores worsened. RN in to give anxiety medication and pain meds,.  Stairs            Wheelchair Mobility    Modified Rankin (Stroke Patients  Only)       Balance Overall balance assessment: Needs assistance;History of Falls Sitting-balance support: Feet supported;No upper extremity supported Sitting balance-Leahy Scale: Good     Standing balance support: No upper extremity supported;During functional activity Standing balance-Leahy Scale: Fair                               Pertinent Vitals/Pain Pain Assessment: 0-10 Pain Score: 10-Worst pain ever Pain Location: HA Pain Descriptors / Indicators: Aching Pain Intervention(s): Monitored during session;RN gave pain meds during session;Relaxation    Home Living Family/patient expects to be discharged to:: Private residence Living Arrangements: Non-relatives/Friends (2 roommates) limited help from them per patient. Available Help at Discharge: Personal care attendant Type of Home: House Home Access: Stairs to enter Entrance Stairs-Rails: Right Entrance Stairs-Number of Steps: 7 Home Layout: One level Home Equipment: Walker - 2 wheels;Shower seat;Hand held shower head Additional Comments: aide 2 hours /day    Prior Function                 Hand Dominance        Extremity/Trunk Assessment   Upper Extremity Assessment Upper Extremity Assessment: Generalized weakness;RUE deficits/detail;LUE deficits/detail RUE Deficits / Details: noted signigficant tremors LUE Deficits / Details: same as right    Lower Extremity Assessment Lower Extremity Assessment: Generalized weakness    Cervical / Trunk Assessment Cervical / Trunk Assessment: Kyphotic  Communication  Cognition Arousal/Alertness: Awake/alert Behavior During Therapy: WFL for tasks assessed/performed Overall Cognitive Status: Within Functional Limits for tasks assessed                 General Comments: appears down about his condition     General Comments      Exercises     Assessment/Plan    PT Assessment Patient needs continued PT services  PT Problem List  Decreased strength;Decreased range of motion;Decreased activity tolerance;Decreased balance;Decreased mobility;Cardiopulmonary status limiting activity          PT Treatment Interventions DME instruction;Gait training;Stair training;Functional mobility training;Therapeutic activities;Therapeutic exercise;Patient/family education    PT Goals (Current goals can be found in the Care Plan section)  Acute Rehab PT Goals Patient Stated Goal: to breathe better PT Goal Formulation: With patient Time For Goal Achievement: 06/17/16 Potential to Achieve Goals: Good    Frequency Min 3X/week   Barriers to discharge Decreased caregiver support      Co-evaluation               End of Session Equipment Utilized During Treatment: Oxygen;Gait belt Activity Tolerance: Treatment limited secondary to medical complications (Comment) Patient left: in bed;with call bell/phone within reach;with bed alarm set Nurse Communication: Mobility status         Time: [id number] PT Time Calculation (min) (ACUTE ONLY): 24 min   Charges:   PT Evaluation $PT Eval Low Complexity: 1 Procedure PT Treatments $Gait Training: 8-22 mins   PT G Codes:        Claretha Cooper 06/03/2016, 4:44 PM Tresa Endo PT [phone]

## 2016-06-03 NOTE — Progress Notes (Signed)
PROGRESS NOTE    Philip Coleman  [id number] DOB: 11-12-1955 DOA: 06/02/2016 PCP: Berkley Harvey, NP   Brief Narrative: Philip Coleman is a 61 y.o. male with a history of chronic respiratory failure on 3-4 L of oxygen at home, COPD, tobacco use. Patient presented with dyspnea and found to have community-acquired pneumonia. He initially required BiPAP but was weaned to his home oxygen requirements.   Assessment & Plan:   Principal Problem:   CAP (community acquired pneumonia) Active Problems:   COPD with emphysema ([place])   Tobacco abuse   Protein-calorie malnutrition, severe ([place])   Acute on chronic respiratory failure (HCC)   CAD in native artery   Hyperlipidemia   Esophageal reflux   Anxiety disorder   BPH (benign prostatic hyperplasia)   Community-acquired pneumonia -Continue ceftriaxone and azithromycin -Cultures pending  Acute on chronic respiratory failure Secondary to community-acquired pneumonia in setting of COPD. Does not appear to be in COPD exacerbation.  Tobacco abuse Patient counseled  Protein calorie malnutrition, severe Dietitian recommendations  Hyperlipidemia -Continue statin  Esophageal reflux -Continue PPI  Anxiety disorder -Continue Klonopin -Continue Zoloft  History of CAD -Continue Plavix -Continue statin -Continue nitroglycerin when necessary  BPH -Continue Flomax  Chronic solid congestive heart failure Last echo on 11/22/2013 with an EF of 60-65% and grade 1 diastolic dysfunction. Patient is dry on exam.  Weakness Likely secondary to poor nutrition state and acute illness -PT evaluation   DVT prophylaxis: Lovenox subcutaneous  Code Status: Full code Family Communication: None at bedside Disposition Plan: Discharge likely tomorrow.   Consultants:    none  Procedures:  BiPAP  Antimicrobials:  Ceftriaxone  Azithromycin    Subjective: Patient reports feeling slightly better today. Still has some dyspnea. No  chest pain.  Objective: Vitals:   06/03/16 0456 06/03/16 1022 06/03/16 1309 06/03/16 1410  BP: (!) 157/101   131/84  Pulse: 94   (!) 103  Resp: 14   18  Temp: 98.6 F (37 C)   98.3 F (36.8 C)  TempSrc: Oral   Oral  SpO2: 98% 94% 94% 96%  Weight:      Height:        Intake/Output Summary (Last 24 hours) at 06/03/16 1438 Last data filed at 06/03/16 1410  Gross per 24 hour  Intake              980 ml  Output             1075 ml  Net              -95 ml   Filed Weights   06/02/16 1050  Weight: 45.2 kg (99 lb 10.4 oz)    Examination:  General exam: Appears calm and comfortable. Cachectic Respiratory system: Decreased breath sounds bilaterally with minimal wheezing. Respiratory effort normal. Cardiovascular system: S1 & S2 heard, RRR. No murmurs. Gastrointestinal system: Abdomen is nondistended, soft and nontender. Normal bowel sounds heard. Central nervous system: Alert and oriented. No focal neurological deficits. Extremities: No edema. No calf tenderness Skin: No cyanosis. No rashes Psychiatry: Judgement and insight appear normal. Mood & affect appropriate.     Data Reviewed: I have personally reviewed following labs and imaging studies  CBC:  Recent Labs Lab 06/02/16 0023 06/02/16 0034 06/02/16 1300 06/03/16 0417  WBC 18.3*  --  9.5 15.6*  NEUTROABS 10.4*  --   --   --   HGB 11.0* 11.9* 10.2* 9.4*  HCT 37.4* 35.0* 33.1* 30.4*  MCV  92.1  --  83.8 85.6  PLT 337  --  343 [id number]   Basic Metabolic Panel:  Recent Labs Lab 06/02/16 0034 06/02/16 1300 06/03/16 0417  NA 135  --  136  K 3.2*  --  4.1  CL 94*  --  99*  CO2  --   --  33*  GLUCOSE 129*  --  112*  BUN 10  --  22*  CREATININE 1.00 0.91 0.79  CALCIUM  --   --  8.1*   GFR: Estimated Creatinine Clearance: 64.3 mL/min (by C-G formula based on SCr of 0.79 mg/dL). Liver Function Tests: No results for input(s): AST, ALT, ALKPHOS, BILITOT, PROT, ALBUMIN in the last 168 hours. No results for  input(s): LIPASE, AMYLASE in the last 168 hours. No results for input(s): AMMONIA in the last 168 hours. Coagulation Profile: No results for input(s): INR, PROTIME in the last 168 hours. Cardiac Enzymes: No results for input(s): CKTOTAL, CKMB, CKMBINDEX, TROPONINI in the last 168 hours. BNP (last 3 results) No results for input(s): PROBNP in the last 8760 hours. HbA1C: No results for input(s): HGBA1C in the last 72 hours. CBG: No results for input(s): GLUCAP in the last 168 hours. Lipid Profile: No results for input(s): CHOL, HDL, LDLCALC, TRIG, CHOLHDL, LDLDIRECT in the last 72 hours. Thyroid Function Tests: No results for input(s): TSH, T4TOTAL, FREET4, T3FREE, THYROIDAB in the last 72 hours. Anemia Panel: No results for input(s): VITAMINB12, FOLATE, FERRITIN, TIBC, IRON, RETICCTPCT in the last 72 hours. Sepsis Labs:  Recent Labs Lab 06/02/16 0300  LATICACIDVEN 1.2    No results found for this or any previous visit (from the past 240 hour(s)).       Radiology Studies: Dg Chest Portable 1 View  Result Date: 06/02/2016 CLINICAL DATA:  Acute onset of shortness of breath and wheezing. Initial encounter. EXAM: PORTABLE CHEST 1 VIEW COMPARISON:  Chest radiograph performed 11/26/2013 FINDINGS: The lungs are well-aerated. Vascular congestion is noted. Left midlung opacity may reflect mild pneumonia. Underlying emphysema is noted bilaterally. Chronic bibasilar pleural thickening is noted. There is no evidence of significant pleural effusion or pneumothorax. The cardiomediastinal silhouette is within normal limits. No acute osseous abnormalities are seen. IMPRESSION: Vascular congestion noted. Left midlung opacity may reflect mild pneumonia, depending on the patient's symptoms. Underlying bilateral emphysema noted. Electronically Signed   By: Garald Balding M.D.   On: 06/02/2016 00:39        Scheduled Meds: . amLODipine  5 mg Oral Daily  . atorvastatin  40 mg Oral q1800  .  azithromycin  500 mg Intravenous Q24H  . cefTRIAXone (ROCEPHIN)  IV  1 g Intravenous Q24H  . chlorhexidine  15 mL Mouth Rinse BID  . clopidogrel  75 mg Oral Daily  . dextromethorphan-guaiFENesin  1 tablet Oral BID  . enoxaparin (LOVENOX) injection  40 mg Subcutaneous Q24H  . feeding supplement (ENSURE ENLIVE)  237 mL Oral TID BM  . ipratropium-albuterol  3 mL Nebulization TID  . isosorbide mononitrate  15 mg Oral Daily  . mouth rinse  15 mL Mouth Rinse q12n4p  . mometasone-formoterol  2 puff Inhalation BID  . pantoprazole  40 mg Oral Daily  . sertraline  100 mg Oral Daily  . tamsulosin  0.4 mg Oral QPC supper   Continuous Infusions:   LOS: 1 day     Cordelia Poche [facility] 06/03/2016, 2:38 PM Pager: [phone]  If 7PM-7AM, please contact night-coverage [email or web address] Password [facility] 06/03/2016, 2:38 PM

## 2016-06-03 NOTE — Progress Notes (Signed)
PT Cancellation Note  Patient Details Name: Philip Coleman MRN: [id number] DOB: 07/10/1955   Cancelled Treatment:    Reason Eval/Treat Not Completed: Fatigue/lethargy limiting ability to participate (wants to wait til PM) will check back  This PM    Claretha Cooper 06/03/2016, 1:39 PM Tresa Endo PT [phone]

## 2016-06-04 ENCOUNTER — Inpatient Hospital Stay (HOSPITAL_COMMUNITY): Payer: Medicare Other

## 2016-06-04 DIAGNOSIS — E785 Hyperlipidemia, unspecified: Secondary | ICD-10-CM

## 2016-06-04 LAB — CBC
HCT: 32.7 % — ABNORMAL LOW (ref 39.0–52.0)
Hemoglobin: 10.1 g/dL — ABNORMAL LOW (ref 13.0–17.0)
MCH: 26.4 pg (ref 26.0–34.0)
MCHC: 30.9 g/dL (ref 30.0–36.0)
MCV: 85.4 fL (ref 78.0–100.0)
PLATELETS: 323 10*3/uL (ref 150–400)
RBC: 3.83 MIL/uL — ABNORMAL LOW (ref 4.22–5.81)
RDW: 15.8 % — AB (ref 11.5–15.5)
WBC: 13.1 10*3/uL — ABNORMAL HIGH (ref 4.0–10.5)

## 2016-06-04 MED ORDER — PREDNISONE 20 MG PO TABS
40.0000 mg | ORAL_TABLET | Freq: Every day | ORAL | Status: DC
  Administered 2016-06-04 – 2016-06-05 (×2): 40 mg via ORAL
  Filled 2016-06-04 (×2): qty 2

## 2016-06-04 NOTE — Progress Notes (Signed)
PROGRESS NOTE    Philip Coleman  [id number] DOB: Feb 16, 1956 DOA: 06/02/2016 PCP: Berkley Harvey, NP   Brief Narrative: Philip Coleman is a 61 y.o. male with a history of chronic respiratory failure on 3-4 L of oxygen at home, COPD, tobacco use. Patient presented with dyspnea and found to have community-acquired pneumonia. He initially required BiPAP but was weaned to his home oxygen requirements.   Assessment & Plan:   Principal Problem:   CAP (community acquired pneumonia) Active Problems:   COPD with emphysema ([place])   Tobacco abuse   Protein-calorie malnutrition, severe ([place])   Acute on chronic respiratory failure (HCC)   CAD in native artery   Hyperlipidemia   Esophageal reflux   Anxiety disorder   BPH (benign prostatic hyperplasia)   Community-acquired pneumonia -Continue ceftriaxone and azithromycin -Cultures pending  Acute on chronic respiratory failure Secondary to community-acquired pneumonia in setting of COPD. Seems he may be developing the beginnings of an exacerbation.  COPD with mild exacerbation On chronic oxygen with coughing and sputum production in addition to increased work of breathing with ambulation -continue antibiotics as above -continue bronchodilators -start prednisone 40mg  daily  Tobacco abuse Patient counseled  Protein calorie malnutrition, severe Dietitian recommendations  Hyperlipidemia -Continue statin  Esophageal reflux -Continue PPI  Anxiety disorder -Continue Klonopin -Continue Zoloft  History of CAD -Continue Plavix -Continue statin -Continue nitroglycerin when necessary  BPH -Continue Flomax  Chronic solid congestive heart failure Last echo on 11/22/2013 with an EF of 60-65% and grade 1 diastolic dysfunction. Patient is dry on exam.  Weakness Likely secondary to poor nutrition state and acute illness -PT evaluation: home health PT (declined SNF)  Headache Present for three weeks. Treating with tramadol.  May have occurred after having a fall. No focal deficits -head CT   DVT prophylaxis: Lovenox subcutaneous  Code Status: Full code Family Communication: None at bedside Disposition Plan: Anticipated discharge today, but will likely be tomorrow pending results of CT and improvement in patient's function.   Consultants:    none  Procedures:  BiPAP  Antimicrobials:  Ceftriaxone  Azithromycin    Subjective: Headache today. Coughing. Feels weak.  Objective: Vitals:   06/04/16 0526 06/04/16 0935 06/04/16 0936 06/04/16 1020  BP: (!) 148/88   123/75  Pulse: 88   84  Resp: 16     Temp: 98 F (36.7 C)     TempSrc: Oral     SpO2: 98% 97% 97%   Weight:      Height:        Intake/Output Summary (Last 24 hours) at 06/04/16 1227 Last data filed at 06/04/16 1125  Gross per 24 hour  Intake              480 ml  Output             2120 ml  Net            -1640 ml   Filed Weights   06/02/16 1050  Weight: 45.2 kg (99 lb 10.4 oz)    Examination:  General exam: Appears calm and comfortable. Cachectic Respiratory system: Decreased breath sounds bilaterally with minimal wheezing again heard today (after breathing treatment). Respiratory effort normal. Cardiovascular system: S1 & S2 heard, RRR. No murmurs. Gastrointestinal system: Abdomen is nondistended, soft and nontender. Normal bowel sounds heard. Central nervous system: Alert and oriented. No focal neurological deficits. Extremities: No edema. No calf tenderness Skin: No cyanosis. No rashes Psychiatry: Judgement and insight appear normal. Mood &  affect appropriate.     Data Reviewed: I have personally reviewed following labs and imaging studies  CBC:  Recent Labs Lab 06/02/16 0023 06/02/16 0034 06/02/16 1300 06/03/16 0417 06/04/16 0855  WBC 18.3*  --  9.5 15.6* 13.1*  NEUTROABS 10.4*  --   --   --   --   HGB 11.0* 11.9* 10.2* 9.4* 10.1*  HCT 37.4* 35.0* 33.1* 30.4* 32.7*  MCV 92.1  --  83.8 85.6 85.4  PLT  337  --  343 307 [id number]   Basic Metabolic Panel:  Recent Labs Lab 06/02/16 0034 06/02/16 1300 06/03/16 0417  NA 135  --  136  K 3.2*  --  4.1  CL 94*  --  99*  CO2  --   --  33*  GLUCOSE 129*  --  112*  BUN 10  --  22*  CREATININE 1.00 0.91 0.79  CALCIUM  --   --  8.1*   GFR: Estimated Creatinine Clearance: 64.3 mL/min (by C-G formula based on SCr of 0.79 mg/dL). Liver Function Tests: No results for input(s): AST, ALT, ALKPHOS, BILITOT, PROT, ALBUMIN in the last 168 hours. No results for input(s): LIPASE, AMYLASE in the last 168 hours. No results for input(s): AMMONIA in the last 168 hours. Coagulation Profile: No results for input(s): INR, PROTIME in the last 168 hours. Cardiac Enzymes: No results for input(s): CKTOTAL, CKMB, CKMBINDEX, TROPONINI in the last 168 hours. BNP (last 3 results) No results for input(s): PROBNP in the last 8760 hours. HbA1C: No results for input(s): HGBA1C in the last 72 hours. CBG: No results for input(s): GLUCAP in the last 168 hours. Lipid Profile: No results for input(s): CHOL, HDL, LDLCALC, TRIG, CHOLHDL, LDLDIRECT in the last 72 hours. Thyroid Function Tests: No results for input(s): TSH, T4TOTAL, FREET4, T3FREE, THYROIDAB in the last 72 hours. Anemia Panel: No results for input(s): VITAMINB12, FOLATE, FERRITIN, TIBC, IRON, RETICCTPCT in the last 72 hours. Sepsis Labs:  Recent Labs Lab 06/02/16 0300  LATICACIDVEN 1.2    No results found for this or any previous visit (from the past 240 hour(s)).       Radiology Studies: Ct Head Wo Contrast  Result Date: 06/04/2016 CLINICAL DATA:  Headache EXAM: CT HEAD WITHOUT CONTRAST TECHNIQUE: Contiguous axial images were obtained from the base of the skull through the vertex without intravenous contrast. COMPARISON:  None. FINDINGS: Brain: Patchy chronic microvascular disease throughout the deep white matter. No acute intracranial abnormality. Specifically, no hemorrhage, hydrocephalus, mass  lesion, acute infarction, or significant intracranial injury. Vascular: No hyperdense vessel or unexpected calcification. Skull: No acute calvarial abnormality. Sinuses/Orbits: Visualized paranasal sinuses and mastoids clear. Orbital soft tissues unremarkable. Other: None IMPRESSION: Patchy chronic microvascular disease. No acute intracranial abnormality. Electronically Signed   By: Rolm Baptise M.D.   On: 06/04/2016 11:35        Scheduled Meds: . amLODipine  5 mg Oral Daily  . atorvastatin  40 mg Oral q1800  . azithromycin  500 mg Intravenous Q24H  . cefTRIAXone (ROCEPHIN)  IV  1 g Intravenous Q24H  . chlorhexidine  15 mL Mouth Rinse BID  . clopidogrel  75 mg Oral Daily  . dextromethorphan-guaiFENesin  1 tablet Oral BID  . enoxaparin (LOVENOX) injection  40 mg Subcutaneous Q24H  . feeding supplement (ENSURE ENLIVE)  237 mL Oral TID BM  . ipratropium-albuterol  3 mL Nebulization TID  . isosorbide mononitrate  15 mg Oral Daily  . mouth rinse  15 mL Mouth Rinse  q12n4p  . mometasone-formoterol  2 puff Inhalation BID  . pantoprazole  40 mg Oral Daily  . predniSONE  40 mg Oral Q breakfast  . sertraline  100 mg Oral Daily  . tamsulosin  0.4 mg Oral QPC supper   Continuous Infusions:   LOS: 2 days     Cordelia Poche [facility] 06/04/2016, 12:27 PM Pager: [phone]  If 7PM-7AM, please contact night-coverage [email or web address] Password [facility] 06/04/2016, 12:27 PM

## 2016-06-05 DIAGNOSIS — J189 Pneumonia, unspecified organism: Principal | ICD-10-CM

## 2016-06-05 MED ORDER — AZITHROMYCIN 500 MG PO TABS: 500.0000 mg | ORAL_TABLET | Freq: Every day | ORAL | 0 refills | Status: AC

## 2016-06-05 MED ORDER — ENSURE ENLIVE PO LIQD: 237.0000 mL | Freq: Three times a day (TID) | ORAL | 30 refills | Status: AC

## 2016-06-05 MED ORDER — DM-GUAIFENESIN ER 30-600 MG PO TB12: 1.0000 | ORAL_TABLET | Freq: Two times a day (BID) | ORAL | 0 refills | Status: DC

## 2016-06-05 MED ORDER — PREDNISONE 20 MG PO TABS: 40.0000 mg | ORAL_TABLET | Freq: Every day | ORAL | 0 refills | Status: DC

## 2016-06-05 MED ORDER — AMOXICILLIN 875 MG PO TABS: 875.0000 mg | ORAL_TABLET | Freq: Two times a day (BID) | ORAL | 0 refills | Status: AC

## 2016-06-05 NOTE — Progress Notes (Signed)
Discharge instructions explained to pt and prescriptions given. IV dc'd. Care manager called for transportation home. [facility] called to transport pt home will need O2 4L via [place] for the transport. Home health to contact pt for home PT and social worker. Pt refused to go to SNF.

## 2016-06-05 NOTE — Progress Notes (Signed)
Physical Therapy Treatment Patient Details Name: Philip Coleman MRN: [id number] DOB: 1955-12-30 Today's Date: 06/05/2016    History of Present Illness Philip Coleman is an 61 y.o. male with a PMH of chronic respiratory failure on 3-4 liters of home oxygen secondary to COPD with ongoing tobacco abuse   Chest x-ray showed vascular congestion with a left midlung opacity concerning for pneumonia with changes of bilateral emphysema    PT Comments    Pt in bed on 4 lts with sats at 96%.  Assisted OOB to amb a greater distance on 4 lts to achieve sats 88% > Very limited activity tolerance with 4/4 DOE requiring extended sitting rest break.  Pt aware of hid limitations and when to stop activity.  Correctly performed purse lip breathing.  Pt stated at home he has a chair in each room (even the kitchen) to sit on when he feels SOB.     Follow Up Recommendations  Home health PT;Supervision/Assistance - 24 hour (Pt declined SNF)     Equipment Recommendations  None recommended by PT    Recommendations for Other Services       Precautions / Restrictions Precautions Precaution Comments: home O2     3 - 4 lts Restrictions Weight Bearing Restrictions: No Other Position/Activity Restrictions: Philip Coleman is an 61 y.o. male with a PMH of chronic respiratory failure on 3-4 liters of home oxygen secondary to COPD with ongoing tobacco abuse who presents 06/02/16 with  worsening dyspnea accompanied by weakness Chest x-ray  concerning for pneumonia with changes of bilateral emphysema    Mobility  Bed Mobility Overal bed mobility: Modified Independent                Transfers Overall transfer level: Needs assistance Equipment used: None Transfers: Sit to/from [facility] Sit to Stand: Min guard Stand pivot transfers: Min guard       General transfer comment: did not use walker this session   Ambulation/Gait Ambulation/Gait assistance: Min guard;Min assist Ambulation  Distance (Feet): 90 Feet (45 feet x 2 one sitting rest break ) Assistive device: None Gait Pattern/deviations: Step-through pattern;Decreased stride length;Narrow base of support Gait velocity: decreased    General Gait Details: pt rquired 4 lts to achieve sats 88%  or >     Pt aware of his own activity limitation and when to stop and rest.  Pt demonstarted 4/4 DOE and required one VC to stop talking and to perform purse lip breathing which he did correctly.    Stairs            Wheelchair Mobility    Modified Rankin (Stroke Patients Only)       Balance                                    Cognition Arousal/Alertness: Awake/alert Behavior During Therapy: WFL for tasks assessed/performed Overall Cognitive Status: Within Functional Limits for tasks assessed                      Exercises      General Comments        Pertinent Vitals/Pain Pain Assessment: No/denies pain Pain Location: HA Pain Descriptors / Indicators: Aching    Home Living                      Prior Function  PT Goals (current goals can now be found in the care plan section) Progress towards PT goals: Progressing toward goals    Frequency    Min 3X/week      PT Plan Current plan remains appropriate    Co-evaluation             End of Session Equipment Utilized During Treatment: Oxygen;Gait belt Activity Tolerance: Treatment limited secondary to medical complications (Comment) (COPD) Patient left: in bed;with call bell/phone within reach;with bed alarm set     Time: 1140-1205 PT Time Calculation (min) (ACUTE ONLY): 25 min  Charges:  $Gait Training: 8-22 mins $Therapeutic Activity: 8-22 mins                    G Codes:      Rica Koyanagi  PTA [facility]  Acute  Rehab Pager      [phone]

## 2016-06-05 NOTE — Discharge Summary (Signed)
Physician Discharge Summary  Philip Coleman [id number] DOB: 1955-08-28 DOA: 06/02/2016  PCP: Berkley Harvey, NP  Admit date: 06/02/2016 Discharge date: 06/05/2016  Admitted From: Home Disposition:  Home  Recommendations for Outpatient Follow-up:  1. Follow up with PCP in 1 week 2. Follow-up with pulmonologist in 1 week 3. Please follow up on the following pending results: urine legionella  Home Health: Physical therapy, social work  Equipment/Devices: Oxygen 4L  Discharge Condition: Stable CODE STATUS: Full code   Brief/Interim Summary:  Chief Complaint: Worsening dyspnea  History of Present Illness:   Philip Coleman is an 61 y.o. male with a PMH of chronic respiratory failure on 3-4 liters of home oxygen secondary to COPD with ongoing tobacco abuse who presents today for evaluation of a 2 day history of worsening dyspnea accompanied by weakness culminating in a fall in his bathroom yesterday. Patient tells me that he typically will double up on his bronchodilator treatments when he gets more dyspneic than usual, but when he tried that this time, he had no relief. He reports that he has had his flu vaccination and his pneumonia vaccination, and has not had any sick contacts. No frank fever or chills but he does have a cough productive of clear sputum.  ED Course:  The patient developed significant hypoxia with pulse ox saturations in the mid 50s necessitating initiation of BiPAP transiently in the night. BiPAP was weaned early this morning and he currently is on his usual 3-4 liters of oxygen via nasal cannula. Chest x-ray showed vascular congestion with a left midlung opacity concerning for pneumonia with changes of bilateral emphysema. Labs were significant for a WBC of 18.3, and elevated BNP at 189.1, an ABG on BiPAP showing a PCO2 of 51.3, negative influenza screen, and a lactic acid of 1.2. He was given a dose of Rocephin and azithromycin and referred for  admission.   Hospital course:  Community-acquired pneumonia Patient started on ceftriaxone and azithromycin. Strep pneumonia antigen negative. Legionella pending. Blood cultures negative to date. Transitioned to amoxicillin and azithromycin on discharge.  Acute on chronic respiratory failure Secondary to community-acquired pneumonia in setting of COPD. Seems he may be developing the beginnings of an exacerbation.  COPD with mild exacerbation On chronic oxygen with coughing and sputum production in addition to increased work of breathing with ambulation on admission. Started on prednisone and continued bronchodilators. Discharged with prednisone burst. Patient needs to stop smoking to improve functional status, in addition to increasing nutrition intake to improve overall functional status  Tobacco abuse Patient counseled. imperative that patient quits.  Protein calorie malnutrition, severe Dietitian recommendations. Ordered nutrition supplements and prescribed on discharge.  Hyperlipidemia Continued statin  Esophageal reflux Continued PPI  Anxiety disorder Continued Klonopin and Zoloft  History of CAD Continued Plavix, statin and nitroglycerin prn  BPH Continued Flomax  Chronic solid congestive heart failure Last echo on 11/22/2013 with an EF of 60-65% and grade 1 diastolic dysfunction.   Weakness Likely secondary to poor nutrition state and acute illness. Chronic. Physical therapy recommended SNF but patient declined. Home health physical therapy ordered on discharge  Headache Present for three weeks. Treating with tramadol. May have occurred after having a fall. No focal deficits. Head CT unremarkable for acute process or masses. Outpatient follow-up. If persists, or develops neurologic sequela, may recommend MRI or neurology referral  Discharge Diagnoses:  Principal Problem:   CAP (community acquired pneumonia) Active Problems:   COPD with emphysema  ([place])   Tobacco abuse  Protein-calorie malnutrition, severe ([facility])   Acute on chronic respiratory failure ([facility])   CAD in native artery   Hyperlipidemia   Esophageal reflux   Anxiety disorder   BPH (benign prostatic hyperplasia)    Discharge Instructions  Discharge Instructions    Increase activity slowly    Complete by:  As directed      Allergies as of 06/05/2016      Reactions   Codeine Hives, Nausea And Vomiting      Medication List    TAKE these medications   amoxicillin 875 MG tablet Commonly known as:  AMOXIL Take 1 tablet (875 mg total) by mouth 2 (two) times daily.   atorvastatin 40 MG tablet Commonly known as:  LIPITOR TAKE 1 TABLET (40 MG TOTAL) BY MOUTH DAILY AT 6 PM. PLEASE KEEP YOUR 9/5 APPOINTMENT FOR FUTHER REFILLS   azithromycin 500 MG tablet Commonly known as:  ZITHROMAX Take 1 tablet (500 mg total) by mouth daily.   clonazePAM 0.5 MG tablet Commonly known as:  KLONOPIN TAKE 1 TABLET BY MOUTH TWICE A DAY AS NEEDED anxiety   clopidogrel 75 MG tablet Commonly known as:  PLAVIX TAKE 1 TABLET (75 MG TOTAL) BY MOUTH DAILY.   dextromethorphan-guaiFENesin 30-600 MG 12hr tablet Commonly known as:  MUCINEX DM Take 1 tablet by mouth 2 (two) times daily.   feeding supplement (ENSURE ENLIVE) Liqd Take 237 mLs by mouth 3 (three) times daily between meals.   Ipratropium-Albuterol 20-100 MCG/ACT Aers respimat Commonly known as:  COMBIVENT RESPIMAT INHALE 1 PUFF INTO THE LUNGS EVERY 6 (SIX) HOURS AS NEEDED FOR WHEEZING OR SHORTNESS OF BREATH.   isosorbide mononitrate 30 MG 24 hr tablet Commonly known as:  IMDUR Take 0.5 tablets (15 mg total) by mouth daily.   nitroGLYCERIN 0.4 MG SL tablet Commonly known as:  NITROSTAT Place 1 tablet (0.4 mg total) under the tongue every 5 (five) minutes as needed for chest pain.   pantoprazole 40 MG tablet Commonly known as:  PROTONIX Take 1 tablet (40 mg total) by mouth daily.   predniSONE 20 MG tablet Commonly  known as:  DELTASONE Take 2 tablets (40 mg total) by mouth daily with breakfast. Start taking on:  06/06/2016   sertraline 100 MG tablet Commonly known as:  ZOLOFT TAKE ONE TABLET BY MOUTH DAILY   SYMBICORT 160-4.5 MCG/ACT inhaler Generic drug:  budesonide-formoterol INHALE 2 PUFF INTO THE LUNGS TWICE DAILY   tamsulosin 0.4 MG Caps capsule Commonly known as:  FLOMAX Take 0.4 mg by mouth daily after supper.   tiotropium 18 MCG inhalation capsule Commonly known as:  SPIRIVA Place 18 mcg into inhaler and inhale daily.   traMADol 50 MG tablet Commonly known as:  ULTRAM TAKE TWO TABLETS   BY MOUTH   EVERY 12 HOURS   AS NEEDED   FOR PAIN   VENTOLIN HFA 108 (90 Base) MCG/ACT inhaler Generic drug:  albuterol INHALE TWO   PUFFS INTO THE LUNGS EVERY 6 HOURS AS NEEDED What changed:  Another medication with the same name was removed. Continue taking this medication, and follow the directions you see here.   albuterol (2.5 MG/3ML) 0.083% nebulizer solution Commonly known as:  PROVENTIL TAKE THREE   MLS (2.5 MG TOTAL) BY NEBULIZATION EVERY 6 (SIX) HOURS AS NEEDED FOR WHEEZING OR SHORTNESS OF BREATH. What changed:  Another medication with the same name was removed. Continue taking this medication, and follow the directions you see here.      Follow-up Information  Berkley Harvey, NP. Schedule an appointment as soon as possible for a visit in 1 week(s).   Specialty:  Nurse Practitioner Contact information: [place] [address] [id number]        Chesley Mires, MD. Schedule an appointment as soon as possible for a visit in 1 week(s).   Specialty:  Pulmonary Disease Contact information: [address] [place] [address] [phone]          Allergies  Allergen Reactions  . Codeine Hives and Nausea And Vomiting    Consultations:  None   Procedures/Studies: Ct Head Wo Contrast  Result Date: 06/04/2016 CLINICAL DATA:  Headache EXAM: CT  HEAD WITHOUT CONTRAST TECHNIQUE: Contiguous axial images were obtained from the base of the skull through the vertex without intravenous contrast. COMPARISON:  None. FINDINGS: Brain: Patchy chronic microvascular disease throughout the deep white matter. No acute intracranial abnormality. Specifically, no hemorrhage, hydrocephalus, mass lesion, acute infarction, or significant intracranial injury. Vascular: No hyperdense vessel or unexpected calcification. Skull: No acute calvarial abnormality. Sinuses/Orbits: Visualized paranasal sinuses and mastoids clear. Orbital soft tissues unremarkable. Other: None IMPRESSION: Patchy chronic microvascular disease. No acute intracranial abnormality. Electronically Signed   By: Rolm Baptise M.D.   On: 06/04/2016 11:35   Dg Chest Portable 1 View  Result Date: 06/02/2016 CLINICAL DATA:  Acute onset of shortness of breath and wheezing. Initial encounter. EXAM: PORTABLE CHEST 1 VIEW COMPARISON:  Chest radiograph performed 11/26/2013 FINDINGS: The lungs are well-aerated. Vascular congestion is noted. Left midlung opacity may reflect mild pneumonia. Underlying emphysema is noted bilaterally. Chronic bibasilar pleural thickening is noted. There is no evidence of significant pleural effusion or pneumothorax. The cardiomediastinal silhouette is within normal limits. No acute osseous abnormalities are seen. IMPRESSION: Vascular congestion noted. Left midlung opacity may reflect mild pneumonia, depending on the patient's symptoms. Underlying bilateral emphysema noted. Electronically Signed   By: Garald Balding M.D.   On: 06/02/2016 00:39      Subjective: Patient reports dyspnea with exertion. No chest pain. No wheezing.  Discharge Exam: Vitals:   06/05/16 1046 06/05/16 1347  BP: 127/70 128/70  Pulse:  (!) 103  Resp:  18  Temp:  98.2 F (36.8 C)   Vitals:   06/05/16 0604 06/05/16 0829 06/05/16 1046 06/05/16 1347  BP: 125/70  127/70 128/70  Pulse: 98   (!) 103  Resp:  17   18  Temp: 98.2 F (36.8 C)   98.2 F (36.8 C)  TempSrc: Oral   Oral  SpO2: 93% 100%  97%  Weight:      Height:        General exam: Appears calm and comfortable. Cachectic Respiratory system: Decreased breath sounds bilaterally with minimal wheezing again heard today (after breathing treatment). Respiratory effort normal. Cardiovascular system: S1 & S2 heard, RRR. No murmurs. Gastrointestinal system: Abdomen is nondistended, soft and nontender. Normal bowel sounds heard. Central nervous system: Alert and oriented. No focal neurological deficits. Extremities: No edema. No calf tenderness Skin: No cyanosis. No rashes Psychiatry: Judgement and insight appear normal. Mood & affect appropriate.   The results of significant diagnostics from this hospitalization (including imaging, microbiology, ancillary and laboratory) are listed below for reference.     Microbiology: No results found for this or any previous visit (from the past 240 hour(s)).   Labs: BNP (last 3 results)  Recent Labs  06/02/16 0023  BNP [id number]*   Basic Metabolic Panel:  Recent Labs Lab 06/02/16 0034 06/02/16 1300 06/03/16  0417  NA 135  --  136  K 3.2*  --  4.1  CL 94*  --  99*  CO2  --   --  33*  GLUCOSE 129*  --  112*  BUN 10  --  22*  CREATININE 1.00 0.91 0.79  CALCIUM  --   --  8.1*   Liver Function Tests: No results for input(s): AST, ALT, ALKPHOS, BILITOT, PROT, ALBUMIN in the last 168 hours. No results for input(s): LIPASE, AMYLASE in the last 168 hours. No results for input(s): AMMONIA in the last 168 hours. CBC:  Recent Labs Lab 06/02/16 0023 06/02/16 0034 06/02/16 1300 06/03/16 0417 06/04/16 0855  WBC 18.3*  --  9.5 15.6* 13.1*  NEUTROABS 10.4*  --   --   --   --   HGB 11.0* 11.9* 10.2* 9.4* 10.1*  HCT 37.4* 35.0* 33.1* 30.4* 32.7*  MCV 92.1  --  83.8 85.6 85.4  PLT 337  --  343 307 323   Cardiac Enzymes: No results for input(s): CKTOTAL, CKMB, CKMBINDEX, TROPONINI in the  last 168 hours. BNP: Invalid input(s): POCBNP CBG: No results for input(s): GLUCAP in the last 168 hours. D-Dimer No results for input(s): DDIMER in the last 72 hours. Hgb A1c No results for input(s): HGBA1C in the last 72 hours. Lipid Profile No results for input(s): CHOL, HDL, LDLCALC, TRIG, CHOLHDL, LDLDIRECT in the last 72 hours. Thyroid function studies No results for input(s): TSH, T4TOTAL, T3FREE, THYROIDAB in the last 72 hours.  Invalid input(s): FREET3 Anemia work up No results for input(s): VITAMINB12, FOLATE, FERRITIN, TIBC, IRON, RETICCTPCT in the last 72 hours. Urinalysis    Component Value Date/Time   COLORURINE YELLOW 01/05/2011 0815   APPEARANCEUR CLEAR 01/05/2011 0815   LABSPEC 1.011 01/05/2011 0815   PHURINE 6.5 01/05/2011 0815   GLUCOSEU NEGATIVE 01/05/2011 0815   HGBUR NEGATIVE 01/05/2011 0815   BILIRUBINUR NEGATIVE 01/05/2011 0815   KETONESUR NEGATIVE 01/05/2011 0815   PROTEINUR NEGATIVE 01/05/2011 0815   UROBILINOGEN 0.2 01/05/2011 0815   NITRITE NEGATIVE 01/05/2011 0815   LEUKOCYTESUR NEGATIVE 01/05/2011 0815   Sepsis Labs Invalid input(s): PROCALCITONIN,  WBC,  LACTICIDVEN Microbiology No results found for this or any previous visit (from the past 240 hour(s)).   Time coordinating discharge: Over 30 minutes  SIGNED:   Cordelia Poche, MD [facility] 06/05/2016, 5:25 PM Pager [phone]  If 7PM-7AM, please contact night-coverage [email or web address] Password [email or web address]

## 2016-06-05 NOTE — Care Management Note (Signed)
Case Management Note  Patient Details  Name: ZAMARION EVERT MRN: [id number] Date of Birth: 09/17/1955  Subjective/Objective:                  Patient admitted with CAP (community acquired pneumonia). Also has a history of COPD exacerbation (with home 02),  Acute on chronic respiratory failure, tobacco abuse, severe protein calorie malnutrition, and hyperlipidemia.   Action/Plan: RNCM spoke with patient states he is ready for discharge to home with home health services and needs transportation with oxygen assistance.  States he already has home O2 through [employer] but has no one to take him home.  Discussed options with patient and in agreement to transportation via ambulance, voices understanding that cost of ambulance transportation may be out of pocket.   Patient has not additional CM needs at this time. Home health referral called to [facility]. Spoke with patient's nurse Jenny Reichmann) and gave update on above.   Patient's nurse will arrange transport via ambulance home with [facility] [phone]) and update patient's AVS with home health contact information.    Expected Discharge Date:  06/05/16               Expected Discharge Plan:  [place]  In-House Referral:  NA  Discharge planning Services  CM Consult  Post Acute Care Choice:  Home Health Choice offered to:  Patient (Patient states he is already active with [facility]  for DME.)  DME Arranged:    DME Agency:     HH Arranged:  PT, Social Work [employer] Agency:  [facility]  Status of Service:  Completed, signed off  If discussed at [employer], dates discussed:    Additional Comments:  Serena Colonel, RN 06/05/2016, 5:41 PM

## 2016-06-17 ENCOUNTER — Emergency Department (HOSPITAL_COMMUNITY): Payer: Medicare Other

## 2016-06-17 ENCOUNTER — Encounter (HOSPITAL_COMMUNITY): Payer: Self-pay

## 2016-06-17 ENCOUNTER — Inpatient Hospital Stay (HOSPITAL_COMMUNITY)
Admission: EM | Admit: 2016-06-17 | Discharge: 2016-06-21 | DRG: 193 | Disposition: A | Discharge status: Home Health | Payer: Medicare Other | Attending: Internal Medicine | Admitting: Internal Medicine

## 2016-06-17 ENCOUNTER — Emergency Department (HOSPITAL_COMMUNITY)
Admission: EM | Admit: 2016-06-17 | Discharge: 2016-06-17 | Disposition: A | Discharge status: Home / Self Care | Payer: Medicare Other | Source: Home / Self Care | Attending: Emergency Medicine | Admitting: Emergency Medicine

## 2016-06-17 DIAGNOSIS — Z681 Body mass index (BMI) 19 or less, adult: Secondary | ICD-10-CM

## 2016-06-17 DIAGNOSIS — R0602 Shortness of breath: Secondary | ICD-10-CM

## 2016-06-17 DIAGNOSIS — Z7902 Long term (current) use of antithrombotics/antiplatelets: Secondary | ICD-10-CM | POA: Insufficient documentation

## 2016-06-17 DIAGNOSIS — Y95 Nosocomial condition: Secondary | ICD-10-CM | POA: Diagnosis present

## 2016-06-17 DIAGNOSIS — E785 Hyperlipidemia, unspecified: Secondary | ICD-10-CM | POA: Diagnosis present

## 2016-06-17 DIAGNOSIS — J441 Chronic obstructive pulmonary disease with (acute) exacerbation: Secondary | ICD-10-CM | POA: Diagnosis not present

## 2016-06-17 DIAGNOSIS — F411 Generalized anxiety disorder: Secondary | ICD-10-CM

## 2016-06-17 DIAGNOSIS — Z79899 Other long term (current) drug therapy: Secondary | ICD-10-CM | POA: Insufficient documentation

## 2016-06-17 DIAGNOSIS — I5032 Chronic diastolic (congestive) heart failure: Secondary | ICD-10-CM | POA: Diagnosis present

## 2016-06-17 DIAGNOSIS — E876 Hypokalemia: Secondary | ICD-10-CM | POA: Diagnosis not present

## 2016-06-17 DIAGNOSIS — I5021 Acute systolic (congestive) heart failure: Secondary | ICD-10-CM

## 2016-06-17 DIAGNOSIS — I252 Old myocardial infarction: Secondary | ICD-10-CM

## 2016-06-17 DIAGNOSIS — Z7951 Long term (current) use of inhaled steroids: Secondary | ICD-10-CM

## 2016-06-17 DIAGNOSIS — J449 Chronic obstructive pulmonary disease, unspecified: Secondary | ICD-10-CM | POA: Insufficient documentation

## 2016-06-17 DIAGNOSIS — Z720 Tobacco use: Secondary | ICD-10-CM

## 2016-06-17 DIAGNOSIS — K219 Gastro-esophageal reflux disease without esophagitis: Secondary | ICD-10-CM | POA: Diagnosis not present

## 2016-06-17 DIAGNOSIS — J9620 Acute and chronic respiratory failure, unspecified whether with hypoxia or hypercapnia: Secondary | ICD-10-CM | POA: Diagnosis present

## 2016-06-17 DIAGNOSIS — J189 Pneumonia, unspecified organism: Principal | ICD-10-CM | POA: Diagnosis present

## 2016-06-17 DIAGNOSIS — F329 Major depressive disorder, single episode, unspecified: Secondary | ICD-10-CM | POA: Diagnosis present

## 2016-06-17 DIAGNOSIS — F17210 Nicotine dependence, cigarettes, uncomplicated: Secondary | ICD-10-CM | POA: Insufficient documentation

## 2016-06-17 DIAGNOSIS — J440 Chronic obstructive pulmonary disease with acute lower respiratory infection: Secondary | ICD-10-CM | POA: Diagnosis present

## 2016-06-17 DIAGNOSIS — Z8571 Personal history of Hodgkin lymphoma: Secondary | ICD-10-CM

## 2016-06-17 DIAGNOSIS — R079 Chest pain, unspecified: Secondary | ICD-10-CM

## 2016-06-17 DIAGNOSIS — K5900 Constipation, unspecified: Secondary | ICD-10-CM | POA: Diagnosis present

## 2016-06-17 DIAGNOSIS — Z7952 Long term (current) use of systemic steroids: Secondary | ICD-10-CM

## 2016-06-17 DIAGNOSIS — I110 Hypertensive heart disease with heart failure: Secondary | ICD-10-CM | POA: Diagnosis present

## 2016-06-17 DIAGNOSIS — J439 Emphysema, unspecified: Secondary | ICD-10-CM | POA: Diagnosis present

## 2016-06-17 DIAGNOSIS — Z955 Presence of coronary angioplasty implant and graft: Secondary | ICD-10-CM | POA: Insufficient documentation

## 2016-06-17 DIAGNOSIS — E43 Unspecified severe protein-calorie malnutrition: Secondary | ICD-10-CM | POA: Diagnosis present

## 2016-06-17 DIAGNOSIS — N400 Enlarged prostate without lower urinary tract symptoms: Secondary | ICD-10-CM | POA: Diagnosis present

## 2016-06-17 DIAGNOSIS — J9621 Acute and chronic respiratory failure with hypoxia: Secondary | ICD-10-CM | POA: Diagnosis not present

## 2016-06-17 DIAGNOSIS — Z9981 Dependence on supplemental oxygen: Secondary | ICD-10-CM

## 2016-06-17 DIAGNOSIS — F419 Anxiety disorder, unspecified: Secondary | ICD-10-CM | POA: Diagnosis present

## 2016-06-17 DIAGNOSIS — I2510 Atherosclerotic heart disease of native coronary artery without angina pectoris: Secondary | ICD-10-CM | POA: Diagnosis present

## 2016-06-17 LAB — HEPATIC FUNCTION PANEL
ALK PHOS: 105 U/L (ref 38–126)
ALT: 18 U/L (ref 17–63)
AST: 24 U/L (ref 15–41)
Albumin: 3.5 g/dL (ref 3.5–5.0)
BILIRUBIN TOTAL: 0.3 mg/dL (ref 0.3–1.2)
Bilirubin, Direct: <0.1 mg/dL — ABNORMAL LOW (ref 0.1–0.5)
Total Protein: 7.1 g/dL (ref 6.5–8.1)

## 2016-06-17 LAB — BASIC METABOLIC PANEL
ANION GAP: 9 (ref 5–15)
BUN: 17 mg/dL (ref 6–20)
CHLORIDE: 97 mmol/L — AB (ref 101–111)
CO2: 31 mmol/L (ref 22–32)
CREATININE: 0.78 mg/dL (ref 0.61–1.24)
Calcium: 8.7 mg/dL — ABNORMAL LOW (ref 8.9–10.3)
GFR calc Af Amer: >60 mL/min (ref >60)
GFR calc non Af Amer: >60 mL/min (ref >60)
GLUCOSE: 147 mg/dL — AB (ref 65–99)
Potassium: 3.1 mmol/L — ABNORMAL LOW (ref 3.5–5.1)
Sodium: 137 mmol/L (ref 135–145)

## 2016-06-17 LAB — CBC
HCT: 33.1 % — ABNORMAL LOW (ref 39.0–52.0)
Hemoglobin: 10.4 g/dL — ABNORMAL LOW (ref 13.0–17.0)
MCH: 26.8 pg (ref 26.0–34.0)
MCHC: 31.4 g/dL (ref 30.0–36.0)
MCV: 85.3 fL (ref 78.0–100.0)
Platelets: 453 10*3/uL — ABNORMAL HIGH (ref 150–400)
RBC: 3.88 MIL/uL — ABNORMAL LOW (ref 4.22–5.81)
RDW: 16.3 % — ABNORMAL HIGH (ref 11.5–15.5)
WBC: 18.4 10*3/uL — AB (ref 4.0–10.5)

## 2016-06-17 LAB — I-STAT VENOUS BLOOD GAS, ED
ACID-BASE EXCESS: 6 mmol/L — AB (ref 0.0–2.0)
BICARBONATE: 31.9 mmol/L — AB (ref 20.0–28.0)
O2 SAT: 98 %
PO2 VEN: 110 mmHg — AB (ref 32.0–45.0)
Patient temperature: 98.6
TCO2: 33 mmol/L (ref 0–100)
pCO2, Ven: 51.1 mmHg (ref 44.0–60.0)
pH, Ven: 7.404 (ref 7.250–7.430)

## 2016-06-17 LAB — I-STAT TROPONIN, ED: Troponin i, poc: 0.02 ng/mL (ref 0.00–0.08)

## 2016-06-17 LAB — TROPONIN I: Troponin I: <0.03 ng/mL (ref <0.03)

## 2016-06-17 LAB — LIPASE, BLOOD: Lipase: 16 U/L (ref 11–51)

## 2016-06-17 MED ORDER — POTASSIUM CHLORIDE CRYS ER 20 MEQ PO TBCR
40.0000 meq | EXTENDED_RELEASE_TABLET | Freq: Once | ORAL | Status: AC
  Administered 2016-06-17: 40 meq via ORAL
  Filled 2016-06-17: qty 2

## 2016-06-17 MED ORDER — VANCOMYCIN HCL 500 MG IV SOLR
500.0000 mg | Freq: Two times a day (BID) | INTRAVENOUS | Status: DC
  Administered 2016-06-18: 500 mg via INTRAVENOUS
  Filled 2016-06-17: qty 500

## 2016-06-17 MED ORDER — CLOPIDOGREL BISULFATE 75 MG PO TABS
75.0000 mg | ORAL_TABLET | Freq: Every day | ORAL | Status: DC
Start: 2016-06-18 — End: 2016-06-21
  Administered 2016-06-18 – 2016-06-21 (×4): 75 mg via ORAL
  Filled 2016-06-17 (×4): qty 1

## 2016-06-17 MED ORDER — ALBUTEROL SULFATE HFA 108 (90 BASE) MCG/ACT IN AERS
2.0000 | INHALATION_SPRAY | Freq: Once | RESPIRATORY_TRACT | Status: AC
  Administered 2016-06-17: 2 via RESPIRATORY_TRACT
  Filled 2016-06-17: qty 6.7

## 2016-06-17 MED ORDER — ATORVASTATIN CALCIUM 40 MG PO TABS
40.0000 mg | ORAL_TABLET | Freq: Every evening | ORAL | Status: DC
  Administered 2016-06-18 – 2016-06-20 (×4): 40 mg via ORAL
  Filled 2016-06-17 (×4): qty 1

## 2016-06-17 MED ORDER — SENNA 8.6 MG PO TABS
2.0000 | ORAL_TABLET | Freq: Every day | ORAL | Status: DC
  Administered 2016-06-18 – 2016-06-20 (×4): 17.2 mg via ORAL
  Filled 2016-06-17 (×4): qty 2

## 2016-06-17 MED ORDER — ISOSORBIDE MONONITRATE ER 30 MG PO TB24
15.0000 mg | ORAL_TABLET | Freq: Every day | ORAL | Status: DC
Start: 2016-06-18 — End: 2016-06-21
  Administered 2016-06-18 – 2016-06-21 (×4): 15 mg via ORAL
  Filled 2016-06-17 (×4): qty 1

## 2016-06-17 MED ORDER — ALBUTEROL SULFATE (2.5 MG/3ML) 0.083% IN NEBU
5.0000 mg | INHALATION_SOLUTION | Freq: Once | RESPIRATORY_TRACT | Status: AC
  Administered 2016-06-17: 5 mg via RESPIRATORY_TRACT
  Filled 2016-06-17: qty 6

## 2016-06-17 MED ORDER — DEXTROSE 5 % IV SOLN
1.0000 g | Freq: Three times a day (TID) | INTRAVENOUS | Status: DC
  Administered 2016-06-18 – 2016-06-19 (×4): 1 g via INTRAVENOUS
  Filled 2016-06-17 (×5): qty 1

## 2016-06-17 MED ORDER — CLONAZEPAM 0.5 MG PO TABS
0.5000 mg | ORAL_TABLET | Freq: Once | ORAL | Status: AC
  Administered 2016-06-18: 0.5 mg via ORAL
  Filled 2016-06-17: qty 1

## 2016-06-17 MED ORDER — ACETAMINOPHEN 325 MG PO TABS
650.0000 mg | ORAL_TABLET | Freq: Once | ORAL | Status: AC
  Administered 2016-06-17: 650 mg via ORAL
  Filled 2016-06-17: qty 2

## 2016-06-17 MED ORDER — ENSURE ENLIVE PO LIQD
237.0000 mL | Freq: Three times a day (TID) | ORAL | Status: DC
  Administered 2016-06-18 (×2): 237 mL via ORAL

## 2016-06-17 MED ORDER — PANTOPRAZOLE SODIUM 40 MG PO TBEC
40.0000 mg | DELAYED_RELEASE_TABLET | Freq: Every day | ORAL | Status: DC
  Administered 2016-06-18 – 2016-06-21 (×4): 40 mg via ORAL
  Filled 2016-06-17 (×4): qty 1

## 2016-06-17 MED ORDER — SERTRALINE HCL 100 MG PO TABS
100.0000 mg | ORAL_TABLET | Freq: Every day | ORAL | Status: DC
  Administered 2016-06-18 – 2016-06-21 (×4): 100 mg via ORAL
  Filled 2016-06-17 (×4): qty 1

## 2016-06-17 MED ORDER — TRAMADOL HCL 50 MG PO TABS
100.0000 mg | ORAL_TABLET | Freq: Two times a day (BID) | ORAL | Status: DC | PRN
  Administered 2016-06-18 – 2016-06-21 (×3): 100 mg via ORAL
  Filled 2016-06-17 (×3): qty 2

## 2016-06-17 MED ORDER — ENOXAPARIN SODIUM 30 MG/0.3ML ~~LOC~~ SOLN
30.0000 mg | SUBCUTANEOUS | Status: DC
  Administered 2016-06-18 – 2016-06-21 (×4): 30 mg via SUBCUTANEOUS
  Filled 2016-06-17 (×4): qty 0.3

## 2016-06-17 MED ORDER — ONDANSETRON 4 MG PO TBDP
4.0000 mg | ORAL_TABLET | Freq: Once | ORAL | Status: AC
  Administered 2016-06-17: 4 mg via ORAL
  Filled 2016-06-17: qty 1

## 2016-06-17 MED ORDER — NICOTINE 14 MG/24HR TD PT24
14.0000 mg | MEDICATED_PATCH | Freq: Every day | TRANSDERMAL | Status: DC
  Administered 2016-06-18 – 2016-06-21 (×4): 14 mg via TRANSDERMAL
  Filled 2016-06-17 (×4): qty 1

## 2016-06-17 MED ORDER — GI COCKTAIL ~~LOC~~
30.0000 mL | Freq: Once | ORAL | Status: AC
  Administered 2016-06-17: 30 mL via ORAL
  Filled 2016-06-17: qty 30

## 2016-06-17 MED ORDER — MORPHINE SULFATE (PF) 2 MG/ML IV SOLN
1.0000 mg | INTRAVENOUS | Status: DC | PRN
  Administered 2016-06-18 – 2016-06-21 (×12): 1 mg via INTRAVENOUS
  Filled 2016-06-17 (×12): qty 1

## 2016-06-17 MED ORDER — METHYLPREDNISOLONE SODIUM SUCC 125 MG IJ SOLR
125.0000 mg | Freq: Once | INTRAMUSCULAR | Status: AC
  Administered 2016-06-17: 125 mg via INTRAVENOUS
  Filled 2016-06-17: qty 2

## 2016-06-17 MED ORDER — CLONAZEPAM 0.5 MG PO TABS: 0.5000 mg | ORAL_TABLET | Freq: Two times a day (BID) | ORAL | Status: DC

## 2016-06-17 MED ORDER — CLONAZEPAM 0.5 MG PO TABS
0.5000 mg | ORAL_TABLET | Freq: Two times a day (BID) | ORAL | Status: DC | PRN
  Administered 2016-06-18 – 2016-06-21 (×4): 0.5 mg via ORAL
  Filled 2016-06-17 (×4): qty 1

## 2016-06-17 MED ORDER — CEFEPIME HCL 1 G IJ SOLR
1.0000 g | Freq: Once | INTRAMUSCULAR | Status: AC
  Administered 2016-06-18: 1 g via INTRAVENOUS
  Filled 2016-06-17: qty 1

## 2016-06-17 MED ORDER — IPRATROPIUM-ALBUTEROL 0.5-2.5 (3) MG/3ML IN SOLN: 3.0000 mL | RESPIRATORY_TRACT | Status: DC | PRN

## 2016-06-17 MED ORDER — TRAMADOL HCL 50 MG PO TABS
50.0000 mg | ORAL_TABLET | Freq: Once | ORAL | Status: AC
Start: 2016-06-17 — End: 2016-06-17
  Administered 2016-06-17: 50 mg via ORAL
  Filled 2016-06-17: qty 1

## 2016-06-17 MED ORDER — METHYLPREDNISOLONE SODIUM SUCC 40 MG IJ SOLR
40.0000 mg | Freq: Two times a day (BID) | INTRAMUSCULAR | Status: DC
  Administered 2016-06-18 – 2016-06-19 (×5): 40 mg via INTRAVENOUS
  Filled 2016-06-17 (×5): qty 1

## 2016-06-17 MED ORDER — POTASSIUM CHLORIDE CRYS ER 20 MEQ PO TBCR
40.0000 meq | EXTENDED_RELEASE_TABLET | Freq: Once | ORAL | Status: DC
  Filled 2016-06-17: qty 2

## 2016-06-17 MED ORDER — TAMSULOSIN HCL 0.4 MG PO CAPS
0.4000 mg | ORAL_CAPSULE | Freq: Every day | ORAL | Status: DC
Start: 2016-06-18 — End: 2016-06-21
  Administered 2016-06-18 – 2016-06-20 (×3): 0.4 mg via ORAL
  Filled 2016-06-17 (×3): qty 1

## 2016-06-17 MED ORDER — IPRATROPIUM-ALBUTEROL 0.5-2.5 (3) MG/3ML IN SOLN
3.0000 mL | Freq: Four times a day (QID) | RESPIRATORY_TRACT | Status: DC
  Administered 2016-06-18 – 2016-06-19 (×5): 3 mL via RESPIRATORY_TRACT
  Filled 2016-06-17 (×5): qty 3

## 2016-06-17 MED ORDER — POTASSIUM CHLORIDE 20 MEQ PO PACK
40.0000 meq | PACK | Freq: Once | ORAL | Status: AC
  Administered 2016-06-18: 40 meq via ORAL
  Filled 2016-06-17 (×2): qty 2

## 2016-06-17 MED ORDER — VANCOMYCIN HCL IN DEXTROSE 1-5 GM/200ML-% IV SOLN
1000.0000 mg | Freq: Once | INTRAVENOUS | Status: AC
  Administered 2016-06-17: 1000 mg via INTRAVENOUS
  Filled 2016-06-17: qty 200

## 2016-06-17 NOTE — ED Provider Notes (Signed)
[facility] DEPT Provider Note   CSN: [id number] Arrival date & time: 06/17/16  2025     History   Chief Complaint Chief Complaint  Patient presents with  . Respiratory Distress  . Chest Pain    HPI Philip Coleman is a 61 y.o. male with a hx of end stage COPD who returns to the ED in respiratory distress after being discharged this morning. He has had 3 days of cough, wheezing and SOB. He is on 2-4 L of 02 but states that he has been using 4-8 depending on if he is exerting himself. He was recently  Admitted and discharged for pneumonia and completed a course of abx. He is complaining of cp that is worse with cough. He denies fever. His cxr completed at the earlier visit is concerning for pneumonia.  HPI  Past Medical History:  Diagnosis Date  . Anxiety state, unspecified   . Chronic airway obstruction, not elsewhere classified   . Chronic respiratory failure ([place])   . COPD (chronic obstructive pulmonary disease) ([place])   . Cough   . Depressive disorder, not elsewhere classified   . Diarrhea   . Headache(784.0)   . Hodgkin's disease 1993   with abdominal surgical scar -pt unable to describe   . Lumbago   . NSTEMI (non-ST elevated myocardial infarction) ([place])   . Other dyspnea and respiratory abnormality   . Other malaise and fatigue   . Pneumonia   . Spontaneous pneumothorax    left  . Spontaneous pneumothorax    right  . Thyroid disease    hyper, not on meds  . Unspecified constipation   . Weight loss     Patient Active Problem List   Diagnosis Date Noted  . CAP (community acquired pneumonia) 06/02/2016  . Anxiety disorder 06/02/2016  . BPH (benign prostatic hyperplasia) 06/02/2016  . Orthostatic hypotension 03/06/2014  . NSTEMI (non-ST elevated myocardial infarction) ([place])   . Systolic CHF, acute ([place]) 11/21/2013  . Erectile dysfunction 09/11/2013  . Esophageal reflux 07/03/2013  . Hyperlipidemia 06/12/2013  . Heartburn 05/23/2013  . Pain in joint,  shoulder region 02/13/2013  . CAD in native artery 09/24/2012  . Acute on chronic respiratory failure ([place]) 04/01/2012  . Protein-calorie malnutrition, severe ([place]) 01/23/2012  . Tobacco abuse 12/24/2011  . Hyponatremia 09/20/2011  . 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 ETOH abuse 09/16/2011    Past Surgical History:  Procedure Laterality Date  . CHEST TUBE INSERTION     left  . CHEST TUBE INSERTION     right  . LEFT HEART CATHETERIZATION WITH CORONARY ANGIOGRAM N/A 11/21/2013   Procedure: LEFT HEART CATHETERIZATION WITH CORONARY ANGIOGRAM;  Surgeon: Peter M Martinique, MD;  Location: [facility] CATH LAB;  Service: Cardiovascular;  Laterality: N/A;  . PERCUTANEOUS CORONARY INTERVENTION-BALLOON ONLY  11/22/2013   Procedure: PERCUTANEOUS CORONARY INTERVENTION-BALLOON ONLY;  Surgeon: Jettie Booze, MD;  Location: [facility] CATH LAB;  Service: Cardiovascular;;  CFX  . PERCUTANEOUS CORONARY STENT INTERVENTION (PCI-S) N/A 11/22/2013   Procedure: PERCUTANEOUS CORONARY STENT INTERVENTION (PCI-S);  Surgeon: Jettie Booze, MD;  Location: [facility] CATH LAB;  Service: Cardiovascular;  Laterality: N/A;  LAD  . SPLENECTOMY  1987  . teeth extracted for dentures  2012       Home Medications    Prior to Admission medications   Medication Sig Start Date End Date Taking? Authorizing Provider  albuterol (PROVENTIL HFA;VENTOLIN HFA) 108 (90 Base) MCG/ACT inhaler Inhale 2 puffs into the lungs every 6 (six)  hours as needed for wheezing or shortness of breath.    Historical Provider, MD  albuterol (PROVENTIL) (2.5 MG/3ML) 0.083% nebulizer solution Take 2.5 mg by nebulization every 6 (six) hours as needed for wheezing or shortness of breath.    Historical Provider, MD  atorvastatin (LIPITOR) 40 MG tablet Take 40 mg by mouth every evening.    Historical Provider, MD  budesonide-formoterol (SYMBICORT) 160-4.5 MCG/ACT inhaler Inhale 2 puffs into the lungs 2 (two) times daily.    Historical Provider, MD  clonazePAM  (KLONOPIN) 0.5 MG tablet Take 0.5 mg by mouth 2 (two) times daily as needed for anxiety.    Historical Provider, MD  clopidogrel (PLAVIX) 75 MG tablet Take 75 mg by mouth daily.    Historical Provider, MD  feeding supplement, ENSURE ENLIVE, (ENSURE ENLIVE) LIQD Take 237 mLs by mouth 3 (three) times daily between meals. 06/05/16   Mariel Aloe, MD  Ipratropium-Albuterol (COMBIVENT RESPIMAT) 20-100 MCG/ACT AERS respimat Inhale 1 puff into the lungs every 6 (six) hours as needed for wheezing or shortness of breath.    Historical Provider, MD  isosorbide mononitrate (IMDUR) 30 MG 24 hr tablet Take 15 mg by mouth daily.    Historical Provider, MD  nitroGLYCERIN (NITROSTAT) 0.4 MG SL tablet Place 1 tablet (0.4 mg total) under the tongue every 5 (five) minutes as needed for chest pain. 01/25/16   Jettie Booze, MD  pantoprazole (PROTONIX) 40 MG tablet Take 1 tablet (40 mg total) by mouth daily. 03/06/14   Jettie Booze, MD  predniSONE (DELTASONE) 20 MG tablet Take 2 tablets (40 mg total) by mouth daily with breakfast. 06/06/16   Mariel Aloe, MD  sertraline (ZOLOFT) 100 MG tablet Take 100 mg by mouth daily.    Historical Provider, MD  tamsulosin (FLOMAX) 0.4 MG CAPS capsule Take 0.4 mg by mouth daily after supper.     Historical Provider, MD  traMADol (ULTRAM) 50 MG tablet Take 100 mg by mouth every 12 (twelve) hours as needed for moderate pain.    Historical Provider, MD    Family History Family History  Problem Relation Age of Onset  . Cancer Mother 44  . Hyperlipidemia Mother   . Diabetes Mother   . Pneumonia Father   . Cancer Sister   . Stroke Sister   . Heart attack Neg Hx     Social History Social History  Substance Use Topics  . Smoking status: Current Every Day Smoker    Packs/day: 0.25    Years: 41.00    Types: Cigarettes  . Smokeless tobacco: Current User     Comment: nothinhg will work. Tried everything 09/24/12--5 cigs per day  . Alcohol use No     Comment:  socially     Allergies   Codeine   Review of Systems Review of Systems  Ten systems reviewed and are negative for acute change, except as noted in the HPI.   Physical Exam Updated Vital Signs Ht 5\' 7"  (1.702 m)   Wt 44.9 kg   BMI 15.51 kg/m   Physical Exam  Constitutional: He is oriented to person, place, and time. He appears distressed.  cachectic  HENT:  Head: Normocephalic and atraumatic.  Eyes: EOM are normal. Pupils are equal, round, and reactive to light. Right eye exhibits no discharge. Left eye exhibits no discharge.  Neck: Normal range of motion. Neck supple. No JVD present.  Cardiovascular:  tachycardic  Pulmonary/Chest: He is in respiratory distress. He has wheezes.  Abdominal:  Soft. Bowel sounds are normal.  Neurological: He is alert and oriented to person, place, and time.  Nursing note and vitals reviewed.    ED Treatments / Results  Labs (all labs ordered are listed, but only abnormal results are displayed) Labs Reviewed  BASIC METABOLIC PANEL  CBC  BLOOD GAS, VENOUS  I-STAT TROPOININ, ED   Results for orders placed or performed during the hospital encounter of 06/17/16  CBC  Result Value Ref Range   WBC 18.4 (H) 4.0 - 10.5 K/uL   RBC 3.88 (L) 4.22 - 5.81 MIL/uL   Hemoglobin 10.4 (L) 13.0 - 17.0 g/dL   HCT 33.1 (L) 39.0 - 52.0 %   MCV 85.3 78.0 - 100.0 fL   MCH 26.8 26.0 - 34.0 pg   MCHC 31.4 30.0 - 36.0 g/dL   RDW 16.3 (H) 11.5 - 15.5 %   Platelets 453 (H) 150 - 400 K/uL  Hepatic function panel  Result Value Ref Range   Total Protein 7.1 6.5 - 8.1 g/dL   Albumin 3.5 3.5 - 5.0 g/dL   AST 24 15 - 41 U/L   ALT 18 17 - 63 U/L   Alkaline Phosphatase 105 38 - 126 U/L   Total Bilirubin 0.3 0.3 - 1.2 mg/dL   Bilirubin, Direct <0.1 (L) 0.1 - 0.5 mg/dL   Indirect Bilirubin NOT CALCULATED 0.3 - 0.9 mg/dL  Lipase, blood  Result Value Ref Range   Lipase 16 11 - 51 U/L  Troponin I  Result Value Ref Range   Troponin I <0.03 <0.03 ng/mL  Basic  metabolic panel  Result Value Ref Range   Sodium 137 135 - 145 mmol/L   Potassium 3.1 (L) 3.5 - 5.1 mmol/L   Chloride 97 (L) 101 - 111 mmol/L   CO2 31 22 - 32 mmol/L   Glucose, Bld 147 (H) 65 - 99 mg/dL   BUN 17 6 - 20 mg/dL   Creatinine, Ser 0.78 0.61 - 1.24 mg/dL   Calcium 8.7 (L) 8.9 - 10.3 mg/dL   GFR calc non Af Amer >60 >60 mL/min   GFR calc Af Amer >60 >60 mL/min   Anion gap 9 5 - 15    EKG  EKG Interpretation None       Radiology Dg Chest 2 View  Result Date: 06/17/2016 CLINICAL DATA:  61 y/o M; worsening shortness of breath. History of COPD. EXAM: CHEST  2 VIEW COMPARISON:  06/01/2016 chest radiograph FINDINGS: Stable cardiac silhouette within normal limits. Aortic atherosclerosis with calcification. Emphysema with bullous changes greatest in the left apex and right lung base. Chronic blunting of costal diaphragmatic angles. Left lung base opacity may represent atelectasis or pneumonia. No pneumothorax. IMPRESSION: Left lung base opacity may represent atelectasis or pneumonia. Aortic atherosclerosis. Stable cardiac silhouette. Severe emphysema. Electronically Signed   By: Kristine Garbe M.D.   On: 06/17/2016 06:13    Procedures Procedures (including critical care time)  Medications Ordered in ED Medications - No data to display   Initial Impression / Assessment and Plan / ED Course  I have reviewed the triage vital signs and the nursing notes.  Pertinent labs & imaging results that were available during my care of the patient were reviewed by me and considered in my medical decision making (see chart for details).     Patient with COPD exacerbation and HCAP. He is now resting on 4 L via nasal cannula and back to baseline after 2 with DuoNeb. Patient appears improved. I started  him on cefepime and vancomycin. Patient will be admitted.  Final Clinical Impressions(s) / ED Diagnoses   Final diagnoses:  COPD exacerbation ([place])  HCAP (healthcare-associated  pneumonia)    New Prescriptions New Prescriptions   No medications on file     Margarita Mail, PA-C 06/18/16 [place], DO 06/20/16 [phone]

## 2016-06-17 NOTE — H&P (Signed)
History and Physical    Philip Coleman [id number] DOB: 1955/07/22 DOA: 06/17/2016  PCP: Berkley Harvey, NP   Patient coming from: Home  Chief Complaint: Shortness of breath  HPI: Philip Coleman is a 61 y.o. gentleman with a history of COPD/emphysema, active tobacco use, chronic respiratory failure on 3-4L [place] at baseline, CAD, hyperthyroidism (no treatment), and Hodgkin's disease who was discharged from the hospitalist service in January after a three day stay for COPD exacerbation secondary to CAP.  He was discharged on oral antibiotics and prednisone, which he says that he took as prescribed.  He has developed progressive shortness of breath again over the past 3-4 days.  He has a cough productive of clear sputum.  He has had generalized weakness with light-headedness but no syncope.  He has been "going from hot to cold" with chills, but no documented fever.  He reports one syncopal episode.  No nausea or vomiting.  No diarrhea.  He has a history of constipation.  The admits to being anxious.  He has a roommate that is admitted to [facility] currently.  He actually went to the ED at [facility] earlier today, but he was not admitted at that time.  This evening, he ultimately had to call 911 for progressive shortness of breath and intermittent chest pain.  He received ASA 325, one SL nitro, albuterol/atrovent neb, and IV solumedrol prior to arrival here.  ED Course: Per the ED attending, the patient actually arrived on a NRB, but he has been weaned promptly to [place] in the ED.  VBG reassuring (normal pH, no CO2 retention).  Chest xray concerning for LLL infiltrate.  WBC count 18.  He has received cefepime and vancomycin for HCAP.  EKG does not show ischemic changes.  First troponin negative.  The patient has received a GI cocktail.  Hospitalist asked to place in observation for COPD exacerbation secondary to HCAP and evaluation of chest pain.  Review of Systems: As per HPI otherwise 10 point review of systems  negative.    Past Medical History:  Diagnosis Date  . Anxiety state, unspecified   . Chronic airway obstruction, not elsewhere classified   . Chronic respiratory failure ([place])   . COPD (chronic obstructive pulmonary disease) ([place])   . Cough   . Depressive disorder, not elsewhere classified   . Diarrhea   . Headache(784.0)   . Hodgkin's disease 1993   with abdominal surgical scar -pt unable to describe   . Lumbago   . NSTEMI (non-ST elevated myocardial infarction) ([place])   . Other dyspnea and respiratory abnormality   . Other malaise and fatigue   . Pneumonia   . Spontaneous pneumothorax    left  . Spontaneous pneumothorax    right  . Thyroid disease    hyper, not on meds  . Unspecified constipation   . Weight loss     Past Surgical History:  Procedure Laterality Date  . CHEST TUBE INSERTION     left  . CHEST TUBE INSERTION     right  . LEFT HEART CATHETERIZATION WITH CORONARY ANGIOGRAM N/A 11/21/2013   Procedure: LEFT HEART CATHETERIZATION WITH CORONARY ANGIOGRAM;  Surgeon: Peter M Martinique, MD;  Location[facility] CATH LAB;  Service: Cardiovascular;  Laterality: N/A;  . PERCUTANEOUS CORONARY INTERVENTION-BALLOON ONLY  11/22/2013   Procedure: PERCUTANEOUS CORONARY INTERVENTION-BALLOON ONLY;  Surgeon: Jettie Booze, MD;  Location: [facility] CATH LAB;  Service: Cardiovascular;;  CFX  . PERCUTANEOUS CORONARY STENT INTERVENTION (PCI-S) N/A 11/22/2013  Procedure: PERCUTANEOUS CORONARY STENT INTERVENTION (PCI-S);  Surgeon: Jettie Booze, MD;  Location: [facility] CATH LAB;  Service: Cardiovascular;  Laterality: N/A;  LAD  . SPLENECTOMY  1987  . teeth extracted for dentures  2012     reports that he has been smoking Cigarettes.  He has a 10.25 pack-year smoking history. He uses smokeless tobacco. He reports that he does not drink alcohol or use drugs.  Allergies  Allergen Reactions  . Codeine Hives and Nausea And Vomiting    Family History  Problem Relation Age of Onset  . Cancer  Mother 15  . Hyperlipidemia Mother   . Diabetes Mother   . Pneumonia Father   . Cancer Sister   . Stroke Sister   . Heart attack Neg Hx      Prior to Admission medications   Medication Sig Start Date End Date Taking? Authorizing Provider  albuterol (PROVENTIL HFA;VENTOLIN HFA) 108 (90 Base) MCG/ACT inhaler Inhale 2 puffs into the lungs every 6 (six) hours as needed for wheezing or shortness of breath.   Yes Historical Provider, MD  albuterol (PROVENTIL) (2.5 MG/3ML) 0.083% nebulizer solution Take 2.5 mg by nebulization every 6 (six) hours as needed for wheezing or shortness of breath.   Yes Historical Provider, MD  atorvastatin (LIPITOR) 40 MG tablet Take 40 mg by mouth every evening.   Yes Historical Provider, MD  budesonide-formoterol (SYMBICORT) 160-4.5 MCG/ACT inhaler Inhale 2 puffs into the lungs 2 (two) times daily.   Yes Historical Provider, MD  clonazePAM (KLONOPIN) 0.5 MG tablet Take 0.5 mg by mouth 2 (two) times daily as needed for anxiety.   Yes Historical Provider, MD  clopidogrel (PLAVIX) 75 MG tablet Take 75 mg by mouth daily.   Yes Historical Provider, MD  feeding supplement, ENSURE ENLIVE, (ENSURE ENLIVE) LIQD Take 237 mLs by mouth 3 (three) times daily between meals. 06/05/16  Yes Mariel Aloe, MD  Ipratropium-Albuterol (COMBIVENT RESPIMAT) 20-100 MCG/ACT AERS respimat Inhale 1 puff into the lungs every 6 (six) hours as needed for wheezing or shortness of breath.   Yes Historical Provider, MD  isosorbide mononitrate (IMDUR) 30 MG 24 hr tablet Take 15 mg by mouth daily.   Yes Historical Provider, MD  nitroGLYCERIN (NITROSTAT) 0.4 MG SL tablet Place 1 tablet (0.4 mg total) under the tongue every 5 (five) minutes as needed for chest pain. 01/25/16  Yes Jettie Booze, MD  pantoprazole (PROTONIX) 40 MG tablet Take 1 tablet (40 mg total) by mouth daily. 03/06/14  Yes Jettie Booze, MD  predniSONE (DELTASONE) 20 MG tablet Take 2 tablets (40 mg total) by mouth daily with  breakfast. 06/06/16  Yes Mariel Aloe, MD  sertraline (ZOLOFT) 100 MG tablet Take 100 mg by mouth daily.   Yes Historical Provider, MD  tamsulosin (FLOMAX) 0.4 MG CAPS capsule Take 0.4 mg by mouth daily after supper.    Yes Historical Provider, MD  traMADol (ULTRAM) 50 MG tablet Take 100 mg by mouth every 12 (twelve) hours as needed for moderate pain.   Yes Historical Provider, MD    Physical Exam: Vitals:   06/17/16 2031 06/17/16 2130 06/17/16 2145 06/17/16 2215  BP:  152/84 156/85 157/65  Pulse:  106 102 100  Resp:  20 22   SpO2:  98% 98% 95%  Weight: 44.9 kg (99 lb)     Height: 5\' 7"  (1.702 m)         Constitutional: NAD, calm, chronically ill appearing but he is not acutely  decompensating.  Bilateral temporal wasting. Vitals:   06/17/16 2031 06/17/16 2130 06/17/16 2145 06/17/16 2215  BP:  152/84 156/85 157/65  Pulse:  106 102 100  Resp:  20 22   SpO2:  98% 98% 95%  Weight: 44.9 kg (99 lb)     Height: 5\' 7"  (1.702 m)      Eyes: PERRL, lids and conjunctivae normal ENMT: Mucous membranes are slightly dry. Posterior pharynx clear of any exudate or lesions. Normal dentition.  Neck: very thin with prominent SCM muscles, no masses Respiratory: Diminished breath sounds at LLL.  No active wheezing at the time of my exam.  Mild tachypnea.  No accessory muscle use.  Cardiovascular: Tachycardic but regular.  No murmurs / rubs / gallops. No extremity edema. 2+ pedal pulses.  GI: abdomen is soft and compressible.  No distention.  No tenderness.  Bowel sounds are present. Musculoskeletal:  No joint deformity in upper and lower extremities. Good ROM, no contractures. Normal muscle tone.  Skin: no rashes, warm and dry Neurologic: CN 2-12 grossly intact. Sensation intact, Strength symmetric bilaterally. Psychiatric: Normal judgment and insight. Alert and oriented x 3. Normal mood.     Labs on Admission: I have personally reviewed following labs and imaging studies  CBC:  Recent  Labs Lab 06/17/16 0552  WBC 18.4*  HGB 10.4*  HCT 33.1*  MCV 85.3  PLT 0000000*   Basic Metabolic Panel:  Recent Labs Lab 06/17/16 0620  NA 137  K 3.1*  CL 97*  CO2 31  GLUCOSE 147*  BUN 17  CREATININE 0.78  CALCIUM 8.7*   GFR: Estimated Creatinine Clearance: 63.9 mL/min (by C-G formula based on SCr of 0.78 mg/dL). Liver Function Tests:  Recent Labs Lab 06/17/16 0620  AST 24  ALT 18  ALKPHOS 105  BILITOT 0.3  PROT 7.1  ALBUMIN 3.5    Recent Labs Lab 06/17/16 0620  LIPASE 16   Cardiac Enzymes:  Recent Labs Lab 06/17/16 0620  TROPONINI <0.03    Radiological Exams on Admission: Dg Chest 2 View  Result Date: 06/17/2016 CLINICAL DATA:  62 y/o M; worsening shortness of breath. History of COPD. EXAM: CHEST  2 VIEW COMPARISON:  06/01/2016 chest radiograph FINDINGS: Stable cardiac silhouette within normal limits. Aortic atherosclerosis with calcification. Emphysema with bullous changes greatest in the left apex and right lung base. Chronic blunting of costal diaphragmatic angles. Left lung base opacity may represent atelectasis or pneumonia. No pneumothorax. IMPRESSION: Left lung base opacity may represent atelectasis or pneumonia. Aortic atherosclerosis. Stable cardiac silhouette. Severe emphysema. Electronically Signed   By: Kristine Garbe M.D.   On: 06/17/2016 06:13    EKG: Independently reviewed. Sinus tachycardia.  No acute ST segment changes.  Assessment/Plan Principal Problem:   HCAP (healthcare-associated pneumonia) Active Problems:   COPD with emphysema ([place])   Tobacco abuse   Protein-calorie malnutrition, severe ([facility])   COPD exacerbation ([place])   Acute on chronic respiratory failure ([facility])   CAD in native artery   Esophageal reflux   Anxiety disorder      COPD exacerbation secondary to HCAP --Continue vanc and cefepime per pharmacy --Blood cultures --Sputum culture if he can produce a sample --[place] 3-4L, which is baseline --Respiratory  consult --Incentive spirometry --Solumedrol 40mg  IV q12h for now --Scheduled duonebs.  Hold home inhalers for now.  Chest pain.  Suspect GERD but will monitor on telemetry and run serial troponin.  Anxiety --Klonopin prn.   --Zoloft  Active tobacco use --Nicotine patch  HTN --Imdur  CAD --plavix, statin  GERD --PPI  Constipation --Senna two tabs qHS  Hypokalemia --Replacement ordered.     DVT prophylaxis: Lovenox Code Status: FULL Family Communication: Patient alone in the ED at time of admission. Disposition Plan: Expect he will go home at discharge. Consults called: NONE Admission status: Place in observation with telemetry monitoring and continuous pulse oximetry.   TIME SPENT: 70 minutes   Eber Bastin MD [facility] Pager [phone]  If 7PM-7AM, please contact night-coverage [email or web address] Password [email or web address]  06/17/2016, 10:41 PM

## 2016-06-17 NOTE — ED Provider Notes (Signed)
07:50- case was discussed with Dr. Regenia Skeeter, patient received as a checkout. He is being evaluated for trouble breathing and upper abdominal pain. Labs have returned and initial treatment is complete.  Medications  albuterol (PROVENTIL) (2.5 MG/3ML) 0.083% nebulizer solution 5 mg (5 mg Nebulization Given 06/17/16 0533)  albuterol (PROVENTIL) (2.5 MG/3ML) 0.083% nebulizer solution 5 mg (5 mg Nebulization Given 06/17/16 0541)  acetaminophen (TYLENOL) tablet 650 mg (650 mg Oral Given 06/17/16 0647)  methylPREDNISolone sodium succinate (SOLU-MEDROL) 125 mg/2 mL injection 125 mg (125 mg Intravenous Given 06/17/16 0648)  potassium chloride SA (K-DUR,KLOR-CON) CR tablet 40 mEq (40 mEq Oral Given 06/17/16 0737)    Patient Vitals for the past 24 hrs:  BP Temp Temp src Pulse Resp SpO2  06/17/16 0734 169/89 - - 103 22 97 %  06/17/16 0541 - - - - - 93 %  06/17/16 0533 - - - - - 92 %  06/17/16 0514 161/75 98.2 F (36.8 C) Oral 107 (!) 27 90 %  06/17/16 0511 - - - - - 96 %    1:24 PM Reevaluation with update and discussion. After initial assessment and treatment, an updated evaluation reveals He continues to be alert, cooperative. No respiratory distress. He is eating lunch. I discussed having home health nurse evaluate him in his home and he agreed. Findings discussed with patient, all questions answered. Negan Grudzien L    Clinical Course as of Jun 17 833  Fri Jun 17, 2016  0751 high WBC: (!) 18.4 [EW]  0752 low Hemoglobin: (!) 10.4 [EW]  0752 Low, possibly secondary to aggressive bronchodilator treatment Potassium: (!) 3.1 [EW]  0752 high Glucose: (!) 147 [EW]  0752 low Calcium: (!) 8.7 [EW]  [id number] Alert. States weak and breathing is worse than usual. O2 sat 91% on 4 L. He states that he has been using 2-8 L O2 at home. At this time he is not in respiratory distress. Lungs with decreased air mvt. and generalized wheezing.  [EW]       Clinical Course User Index [EW] Daleen Bo, MD    Hemoglobin   Date Value Ref Range Status  06/17/2016 10.4 (L) 13.0 - 17.0 g/dL Final  06/04/2016 10.1 (L) 13.0 - 17.0 g/dL Final  06/03/2016 9.4 (L) 13.0 - 17.0 g/dL Final  06/02/2016 10.2 (L) 13.0 - 17.0 g/dL Final   WBC  Date Value Ref Range Status  06/17/2016 18.4 (H) 4.0 - 10.5 K/uL Final  06/04/2016 13.1 (H) 4.0 - 10.5 K/uL Final  06/03/2016 15.6 (H) 4.0 - 10.5 K/uL Final  06/02/2016 9.5 4.0 - 10.5 K/uL Final    Dg Chest 2 View  Result Date: 06/17/2016 CLINICAL DATA:  61 y/o M; worsening shortness of breath. History of COPD. EXAM: CHEST  2 VIEW COMPARISON:  06/01/2016 chest radiograph FINDINGS: Stable cardiac silhouette within normal limits. Aortic atherosclerosis with calcification. Emphysema with bullous changes greatest in the left apex and right lung base. Chronic blunting of costal diaphragmatic angles. Left lung base opacity may represent atelectasis or pneumonia. No pneumothorax. IMPRESSION: Left lung base opacity may represent atelectasis or pneumonia. Aortic atherosclerosis. Stable cardiac silhouette. Severe emphysema. Electronically Signed   By: Kristine Garbe M.D.   On: 06/17/2016 06:13     Medical decision making- end-stage COPD with ongoing tobacco abuse, with maximal at home treatment. He is extremely frail, and appears cachectic. Patient's main complaint today is being " tired." Mild elevation of white blood cell count, likely related to current treatment with prednisone. The chest  x-ray is abnormal, but does not indicate a lobar pneumonia. Somewhat similar radiologic findings 06/02/2016 when he was admitted for community-acquired pneumonia. At discharge, he was recommended to follow-up with pulmonary, within one week, but when he called them. His appointment was scheduled for June 2018.   Diagnosis- #1 COPD, without acute exacerbation #2. Tobacco abuse  Plan- current treatment at home including oxygen, bronchodilators and usual regular medications. Follow-up, appointment  made with his pulmonologist, on 06/20/2016 at [place], MD 06/17/16 [phone]

## 2016-06-17 NOTE — ED Notes (Signed)
Spoke with MD.  Durward Fortes him of the difficulties that patient was having getting to the wheelchair to go to the restroom and his concerns about being sent home today.  Patient stated that he feels weak and that he did not have assistance at home.  MD advised to order social work consult.    Spoke to Social work.  Advised would come see patient.

## 2016-06-17 NOTE — ED Notes (Signed)
Pt stated he felt sick to his stomach and unable to eat the sandwich provided.

## 2016-06-17 NOTE — Progress Notes (Signed)
Pharmacy Antibiotic Note  Philip Coleman is a 61 y.o. male admitted on 06/17/2016 with pneumonia. Pt recently completed course of azithromycin/ceftriaxone for CAP and was discharged 1/28. Pt presented to [facility] ED earlier today for worsening SOB and was discharged without new antibiotics, but now reports worsening symptoms. Pharmacy has been consulted for vancomycin dosing.  Plan: -Cefepime 1g IV x1 per MD -Vancomycin 1000mg  IV x1 then 500mg  IV q12h -Monitor renal function, cultures, LOT, vancomycin level as indicated  Height: 5\' 7"  (170.2 cm) Weight: 99 lb (44.9 kg) IBW/kg (Calculated) : 66.1  Temp (24hrs), Avg:98.7 F (37.1 C), Min:98.2 F (36.8 C), Max:99.1 F (37.3 C)   Recent Labs Lab 06/17/16 0552 06/17/16 0620  WBC 18.4*  --   CREATININE  --  0.78    Estimated Creatinine Clearance: 63.9 mL/min (by C-G formula based on SCr of 0.78 mg/dL).    Allergies  Allergen Reactions  . Codeine Hives and Nausea And Vomiting    Antimicrobials this admission: 2/9 Cefepime x1 2/9 Vancomycin >>   Dose adjustments this admission: none  Microbiology results: none  Thank you for allowing pharmacy to be a part of this patient's care.  Arrie Senate, PharmD PGY-1 Pharmacy Resident Pager: [phone] 06/17/2016

## 2016-06-17 NOTE — ED Triage Notes (Signed)
Pt arrives with [facility] c/o respiratory distress. Pt was seen at [facility] today for the same abd was d/c at 4pm today. Went home for a couple of hours and started feeling SHOB again. When EMS arrived his lung sounds were absent. Pt was given 10 albuterol, 0.5 atrovent and 125 Solumedrol. Pt lung sounds now wheezy. Pt also c.o sharp CP upon arrival. Pt took 1 Nitro prior to EMS arrival and 325 ASA with EMS. Pt on neb treatment at this time with 6L . A/O NAD at this time.

## 2016-06-17 NOTE — Discharge Instructions (Addendum)
Continue using her medicine at home as usual.  Follow-up with [name], and your primary care doctor next week as scheduled and listed on this document.  We are going to ask a home health nurse to evaluate you in your home for possible additional assistance.  The home health nurse will come to see you tomorrow to reevaluate you and see if you have additional needs.

## 2016-06-17 NOTE — ED Notes (Signed)
[facility] called for transportation  

## 2016-06-17 NOTE — ED Provider Notes (Signed)
[facility] DEPT Provider Note   CSN: [id number] Arrival date & time: 06/17/16  0507     History   Chief Complaint Chief Complaint  Patient presents with  . Shortness of Breath    HPI Philip Coleman is a 61 y.o. male.  HPI  61 year old male with a history of COPD presents with cough and shortness of breath. This has been ongoing for 3 days. The cough is nonproductive. He has had wheezing and congestion. Has also been having headache and epigastric abdominal pain over the last 3 days. Has chest pain when coughing. Was given 10 mg albuterol and 1 mg Atrovent by EMS. Is chronically on 2-4 liters of oxygen. Recently admitted and discharged for pneumonia and finished his antibiotics yesterday. Finishes steroids within the last couple days. When I saw patient he had just finished an albuterol treatment in ED and was jittery and asking for no more albuterol at this time.  Past Medical History:  Diagnosis Date  . Anxiety state, unspecified   . Chronic airway obstruction, not elsewhere classified   . Chronic respiratory failure ([place])   . COPD (chronic obstructive pulmonary disease) ([place])   . Cough   . Depressive disorder, not elsewhere classified   . Diarrhea   . Headache(784.0)   . Hodgkin's disease 1993   with abdominal surgical scar -pt unable to describe   . Lumbago   . NSTEMI (non-ST elevated myocardial infarction) ([place])   . Other dyspnea and respiratory abnormality   . Other malaise and fatigue   . Pneumonia   . Spontaneous pneumothorax    left  . Spontaneous pneumothorax    right  . Thyroid disease    hyper, not on meds  . Unspecified constipation   . Weight loss     Patient Active Problem List   Diagnosis Date Noted  . CAP (community acquired pneumonia) 06/02/2016  . Anxiety disorder 06/02/2016  . BPH (benign prostatic hyperplasia) 06/02/2016  . Orthostatic hypotension 03/06/2014  . NSTEMI (non-ST elevated myocardial infarction) ([place])   . Systolic CHF, acute  ([place]) 11/21/2013  . Erectile dysfunction 09/11/2013  . Esophageal reflux 07/03/2013  . Hyperlipidemia 06/12/2013  . Heartburn 05/23/2013  . Pain in joint, shoulder region 02/13/2013  . CAD in native artery 09/24/2012  . Acute on chronic respiratory failure ([place]) 04/01/2012  . Protein-calorie malnutrition, severe ([place]) 01/23/2012  . Tobacco abuse 12/24/2011  . Hyponatremia 09/20/2011  . COPD with emphysema ([place]) 09/16/2011  . ETOH abuse 09/16/2011    Past Surgical History:  Procedure Laterality Date  . CHEST TUBE INSERTION     left  . CHEST TUBE INSERTION     right  . LEFT HEART CATHETERIZATION WITH CORONARY ANGIOGRAM N/A 11/21/2013   Procedure: LEFT HEART CATHETERIZATION WITH CORONARY ANGIOGRAM;  Surgeon: Peter M Martinique, MD;  Location: [facility] CATH LAB;  Service: Cardiovascular;  Laterality: N/A;  . PERCUTANEOUS CORONARY INTERVENTION-BALLOON ONLY  11/22/2013   Procedure: PERCUTANEOUS CORONARY INTERVENTION-BALLOON ONLY;  Surgeon: Jettie Booze, MD;  Location: [facility] CATH LAB;  Service: Cardiovascular;;  CFX  . PERCUTANEOUS CORONARY STENT INTERVENTION (PCI-S) N/A 11/22/2013   Procedure: PERCUTANEOUS CORONARY STENT INTERVENTION (PCI-S);  Surgeon: Jettie Booze, MD;  Location: [facility] CATH LAB;  Service: Cardiovascular;  Laterality: N/A;  LAD  . SPLENECTOMY  1987  . teeth extracted for dentures  2012       Home Medications    Prior to Admission medications   Medication Sig Start Date End Date Taking? Authorizing Provider  albuterol (PROVENTIL HFA;VENTOLIN HFA) 108 (90 Base) MCG/ACT inhaler Inhale 2 puffs into the lungs every 6 (six) hours as needed for wheezing or shortness of breath.   Yes Historical Provider, MD  albuterol (PROVENTIL) (2.5 MG/3ML) 0.083% nebulizer solution Take 2.5 mg by nebulization every 6 (six) hours as needed for wheezing or shortness of breath.   Yes Historical Provider, MD  atorvastatin (LIPITOR) 40 MG tablet Take 40 mg by mouth every evening.   Yes Historical  Provider, MD  budesonide-formoterol (SYMBICORT) 160-4.5 MCG/ACT inhaler Inhale 2 puffs into the lungs 2 (two) times daily.   Yes Historical Provider, MD  clonazePAM (KLONOPIN) 0.5 MG tablet Take 0.5 mg by mouth 2 (two) times daily as needed for anxiety.   Yes Historical Provider, MD  clopidogrel (PLAVIX) 75 MG tablet Take 75 mg by mouth daily.   Yes Historical Provider, MD  feeding supplement, ENSURE ENLIVE, (ENSURE ENLIVE) LIQD Take 237 mLs by mouth 3 (three) times daily between meals. 06/05/16  Yes Mariel Aloe, MD  Ipratropium-Albuterol (COMBIVENT RESPIMAT) 20-100 MCG/ACT AERS respimat Inhale 1 puff into the lungs every 6 (six) hours as needed for wheezing or shortness of breath.   Yes Historical Provider, MD  isosorbide mononitrate (IMDUR) 30 MG 24 hr tablet Take 15 mg by mouth daily.   Yes Historical Provider, MD  nitroGLYCERIN (NITROSTAT) 0.4 MG SL tablet Place 1 tablet (0.4 mg total) under the tongue every 5 (five) minutes as needed for chest pain. 01/25/16  Yes Jettie Booze, MD  pantoprazole (PROTONIX) 40 MG tablet Take 1 tablet (40 mg total) by mouth daily. 03/06/14  Yes Jettie Booze, MD  predniSONE (DELTASONE) 20 MG tablet Take 2 tablets (40 mg total) by mouth daily with breakfast. 06/06/16  Yes Mariel Aloe, MD  sertraline (ZOLOFT) 100 MG tablet Take 100 mg by mouth daily.   Yes Historical Provider, MD  tamsulosin (FLOMAX) 0.4 MG CAPS capsule Take 0.4 mg by mouth daily after supper.    Yes Historical Provider, MD  traMADol (ULTRAM) 50 MG tablet Take 100 mg by mouth every 12 (twelve) hours as needed for moderate pain.   Yes Historical Provider, MD    Family History Family History  Problem Relation Age of Onset  . Cancer Mother 83  . Hyperlipidemia Mother   . Diabetes Mother   . Pneumonia Father   . Cancer Sister   . Stroke Sister   . Heart attack Neg Hx     Social History Social History  Substance Use Topics  . Smoking status: Current Every Day Smoker     Packs/day: 0.25    Years: 41.00    Types: Cigarettes  . Smokeless tobacco: Current User     Comment: nothinhg will work. Tried everything 09/24/12--5 cigs per day  . Alcohol use 3.6 oz/week    6 Cans of beer per week     Comment: socially     Allergies   Codeine   Review of Systems Review of Systems  Constitutional: Negative for fever.  HENT: Positive for congestion.   Respiratory: Positive for cough and shortness of breath.   Cardiovascular: Positive for chest pain.  Gastrointestinal: Positive for abdominal pain.  Neurological: Positive for headaches.  All other systems reviewed and are negative.    Physical Exam Updated Vital Signs BP 161/75 (BP Location: Left Arm)   Pulse 107   Temp 98.2 F (36.8 C) (Oral)   Resp (!) 27   SpO2 93%   Physical Exam  Constitutional: He is oriented to person, place, and time. He appears cachectic.  HENT:  Head: Normocephalic and atraumatic.  Right Ear: External ear normal.  Left Ear: External ear normal.  Nose: Nose normal.  Eyes: Right eye exhibits no discharge. Left eye exhibits no discharge.  Neck: Neck supple.  Cardiovascular: Normal rate, regular rhythm and normal heart sounds.   Pulmonary/Chest: Effort normal and breath sounds normal. No accessory muscle usage. Tachypnea noted. He has no wheezes.  Abdominal: Soft. There is no tenderness.  Musculoskeletal: He exhibits no edema.  Neurological: He is alert and oriented to person, place, and time.  Skin: Skin is warm and dry.  Nursing note and vitals reviewed.    ED Treatments / Results  Labs (all labs ordered are listed, but only abnormal results are displayed) Labs Reviewed  CBC - Abnormal; Notable for the following:       Result Value   WBC 18.4 (*)    RBC 3.88 (*)    Hemoglobin 10.4 (*)    HCT 33.1 (*)    RDW 16.3 (*)    Platelets 453 (*)    All other components within normal limits  HEPATIC FUNCTION PANEL  LIPASE, BLOOD  TROPONIN I  BASIC METABOLIC PANEL     EKG  EKG Interpretation  Date/Time:  Friday June 17 2016 05:14:12 EST Ventricular Rate:  105 PR Interval:    QRS Duration: 101 QT Interval:  361 QTC Calculation: 478 R Axis:   85 Text Interpretation:  Sinus tachycardia Biatrial enlargement Nonspecific repol abnormality, diffuse leads Borderline prolonged QT interval ST depression similar to prior Confirmed by Yitzhak Awan MD, Laela Deviney [phone]) on 06/17/2016 6:07:53 AM       Radiology Dg Chest 2 View  Result Date: 06/17/2016 CLINICAL DATA:  61 y/o M; worsening shortness of breath. History of COPD. EXAM: CHEST  2 VIEW COMPARISON:  06/01/2016 chest radiograph FINDINGS: Stable cardiac silhouette within normal limits. Aortic atherosclerosis with calcification. Emphysema with bullous changes greatest in the left apex and right lung base. Chronic blunting of costal diaphragmatic angles. Left lung base opacity may represent atelectasis or pneumonia. No pneumothorax. IMPRESSION: Left lung base opacity may represent atelectasis or pneumonia. Aortic atherosclerosis. Stable cardiac silhouette. Severe emphysema. Electronically Signed   By: Kristine Garbe M.D.   On: 06/17/2016 06:13    Procedures Procedures (including critical care time)  Medications Ordered in ED Medications  albuterol (PROVENTIL) (2.5 MG/3ML) 0.083% nebulizer solution 5 mg (5 mg Nebulization Given 06/17/16 0533)  albuterol (PROVENTIL) (2.5 MG/3ML) 0.083% nebulizer solution 5 mg (5 mg Nebulization Given 06/17/16 0541)  acetaminophen (TYLENOL) tablet 650 mg (650 mg Oral Given 06/17/16 0647)  methylPREDNISolone sodium succinate (SOLU-MEDROL) 125 mg/2 mL injection 125 mg (125 mg Intravenous Given 06/17/16 [id number])     Initial Impression / Assessment and Plan / ED Course  I have reviewed the triage vital signs and the nursing notes.  Pertinent labs & imaging results that were available during my care of the patient were reviewed by me and considered in my medical decision making  (see chart for details).   Patient presents with recurrent COPD exacerbation. CXR with PNA in similar area to last time. Could be recurrent vs delay in healing. He appears well. Now has clear lungs. Afebrile. Has elevated WBC but usually this is elevated for him and he was recently on steroids. He is now talking in complete sentences and resting comfortably. Requesting water and food. Labs currently pending, but unless he begins to worsen I  anticipate he could be treated as an outpatient. Care to Dr. Eulis Foster.  Final Clinical Impressions(s) / ED Diagnoses   Final diagnoses:  COPD exacerbation [facility])    New Prescriptions New Prescriptions   No medications on file     Sherwood Gambler, MD 06/17/16 [phone]

## 2016-06-17 NOTE — ED Notes (Signed)
Attempted to call report

## 2016-06-17 NOTE — ED Notes (Signed)
Per EMS- Pt has had respiratory difficult for past 3-4 days. Given breathing tx by ems at 2000 yesterday. At 0400 still having SOB, home neb tx inadequate. Tripod position, retractions, tachypnea, wheezing. Total of 10mg  of albuterol, 1mg  of atrovent, 125 solu medrol, 2 G of mag given by ems.

## 2016-06-18 DIAGNOSIS — I110 Hypertensive heart disease with heart failure: Secondary | ICD-10-CM | POA: Diagnosis present

## 2016-06-18 DIAGNOSIS — E785 Hyperlipidemia, unspecified: Secondary | ICD-10-CM | POA: Diagnosis present

## 2016-06-18 DIAGNOSIS — F419 Anxiety disorder, unspecified: Secondary | ICD-10-CM | POA: Diagnosis present

## 2016-06-18 DIAGNOSIS — R0602 Shortness of breath: Secondary | ICD-10-CM | POA: Diagnosis present

## 2016-06-18 DIAGNOSIS — I2510 Atherosclerotic heart disease of native coronary artery without angina pectoris: Secondary | ICD-10-CM | POA: Diagnosis present

## 2016-06-18 DIAGNOSIS — J189 Pneumonia, unspecified organism: Principal | ICD-10-CM

## 2016-06-18 DIAGNOSIS — J9620 Acute and chronic respiratory failure, unspecified whether with hypoxia or hypercapnia: Secondary | ICD-10-CM | POA: Diagnosis present

## 2016-06-18 DIAGNOSIS — E43 Unspecified severe protein-calorie malnutrition: Secondary | ICD-10-CM | POA: Diagnosis present

## 2016-06-18 DIAGNOSIS — F329 Major depressive disorder, single episode, unspecified: Secondary | ICD-10-CM | POA: Diagnosis present

## 2016-06-18 DIAGNOSIS — Z681 Body mass index (BMI) 19 or less, adult: Secondary | ICD-10-CM | POA: Diagnosis not present

## 2016-06-18 DIAGNOSIS — Z7902 Long term (current) use of antithrombotics/antiplatelets: Secondary | ICD-10-CM | POA: Diagnosis not present

## 2016-06-18 DIAGNOSIS — F17210 Nicotine dependence, cigarettes, uncomplicated: Secondary | ICD-10-CM | POA: Diagnosis present

## 2016-06-18 DIAGNOSIS — Y95 Nosocomial condition: Secondary | ICD-10-CM | POA: Diagnosis present

## 2016-06-18 DIAGNOSIS — Z8571 Personal history of Hodgkin lymphoma: Secondary | ICD-10-CM | POA: Diagnosis not present

## 2016-06-18 DIAGNOSIS — Z7952 Long term (current) use of systemic steroids: Secondary | ICD-10-CM | POA: Diagnosis not present

## 2016-06-18 DIAGNOSIS — I252 Old myocardial infarction: Secondary | ICD-10-CM | POA: Diagnosis not present

## 2016-06-18 DIAGNOSIS — Z7951 Long term (current) use of inhaled steroids: Secondary | ICD-10-CM | POA: Diagnosis not present

## 2016-06-18 DIAGNOSIS — J441 Chronic obstructive pulmonary disease with (acute) exacerbation: Secondary | ICD-10-CM | POA: Diagnosis present

## 2016-06-18 DIAGNOSIS — Z955 Presence of coronary angioplasty implant and graft: Secondary | ICD-10-CM | POA: Diagnosis not present

## 2016-06-18 DIAGNOSIS — Z9981 Dependence on supplemental oxygen: Secondary | ICD-10-CM | POA: Diagnosis not present

## 2016-06-18 DIAGNOSIS — K219 Gastro-esophageal reflux disease without esophagitis: Secondary | ICD-10-CM | POA: Diagnosis present

## 2016-06-18 DIAGNOSIS — E876 Hypokalemia: Secondary | ICD-10-CM | POA: Diagnosis present

## 2016-06-18 DIAGNOSIS — J440 Chronic obstructive pulmonary disease with acute lower respiratory infection: Secondary | ICD-10-CM | POA: Diagnosis present

## 2016-06-18 DIAGNOSIS — Z79899 Other long term (current) drug therapy: Secondary | ICD-10-CM | POA: Diagnosis not present

## 2016-06-18 DIAGNOSIS — K5900 Constipation, unspecified: Secondary | ICD-10-CM | POA: Diagnosis present

## 2016-06-18 DIAGNOSIS — I5032 Chronic diastolic (congestive) heart failure: Secondary | ICD-10-CM | POA: Diagnosis present

## 2016-06-18 LAB — BASIC METABOLIC PANEL
ANION GAP: 7 (ref 5–15)
BUN: 17 mg/dL (ref 6–20)
CHLORIDE: 99 mmol/L — AB (ref 101–111)
CO2: 32 mmol/L (ref 22–32)
Calcium: 9.4 mg/dL (ref 8.9–10.3)
Creatinine, Ser: 0.84 mg/dL (ref 0.61–1.24)
GFR calc Af Amer: >60 mL/min (ref >60)
GFR calc non Af Amer: >60 mL/min (ref >60)
Glucose, Bld: 131 mg/dL — ABNORMAL HIGH (ref 65–99)
POTASSIUM: 5.3 mmol/L — AB (ref 3.5–5.1)
SODIUM: 138 mmol/L (ref 135–145)

## 2016-06-18 LAB — CBC
HCT: 32.9 % — ABNORMAL LOW (ref 39.0–52.0)
Hemoglobin: 10 g/dL — ABNORMAL LOW (ref 13.0–17.0)
MCH: 26.1 pg (ref 26.0–34.0)
MCHC: 30.4 g/dL (ref 30.0–36.0)
MCV: 85.9 fL (ref 78.0–100.0)
Platelets: 381 10*3/uL (ref 150–400)
RBC: 3.83 MIL/uL — AB (ref 4.22–5.81)
RDW: 16.1 % — ABNORMAL HIGH (ref 11.5–15.5)
WBC: 9.6 10*3/uL (ref 4.0–10.5)

## 2016-06-18 LAB — TROPONIN I
Troponin I: <0.03 ng/mL (ref <0.03)
Troponin I: <0.03 ng/mL (ref <0.03)

## 2016-06-18 MED ORDER — ENSURE ENLIVE PO LIQD
237.0000 mL | Freq: Four times a day (QID) | ORAL | Status: DC
  Administered 2016-06-18 – 2016-06-21 (×12): 237 mL via ORAL

## 2016-06-18 NOTE — Progress Notes (Signed)
Initial Nutrition Assessment  DOCUMENTATION CODES:  Underweight, Severe malnutrition in context of chronic illness   Pt meets criteria for  SEVERE MALNUTRITION in the context of Chronic Illness as evidenced by Severe muscle/fat loss.  INTERVENTION:  Ensure Enlive po qid, each supplement provides 350 kcal and 20 grams of protein   Gave education on more affordable high protein/kcal options/meals with multiple handouts  Pt is slowly losing more weight. If SW has not already met with patient, he could greatly benefit from any nutritional supplement assistance he is eligible for  NUTRITION DIAGNOSIS:  Increased nutrient needs related to catabolic illness as evidenced by severe depletion of body fat, severe depletion of muscle mass.  GOAL:  Patient will meet greater than or equal to 90% of their needs  MONITOR:  PO intake, Supplement acceptance, Labs  REASON FOR ASSESSMENT:  Consult Assessment of nutrition requirement/status  ASSESSMENT:  61 y/o male PMHx Severe COPD, Anxiety, Depression, Chronic Respiratory failure on 3-4 L O2 at baseline, Nstemi, malnutrition, tobacco abuse, CAD, hodgkin's disease. Recently had admission for COPD exacerbation r/t CAP. Represents with 3-4 days SOB. Readmitted for ongoing COPD exacerbation  Pt reports that his PO intake is roughly at baseline. He will eat 5-6 small meals a day, sip on kcal containing beverages and eat high protein sources. All of these are behaviors he should be following. He says he eats the best he can, but notes his severe COPD some times makes it so he is so weak he cannot even get out of bed for a days time. He sounds to rely on convenience foods such as frozen meals. He claims these are high protein/low sodium, however this statement is doubted.   Though he wishes to consume ensure all the time, He has significant financial restrictions and says he is unable to purchase any type of Ensure supplement. He claims the Ensure Plus and  Jeanne Ivan are the same price and it costs 40 dollars for a 12 pack. This price is overestimated, though they are still not cheap. He would benefit from any assistance programs.   As an alternative, RD discussed how to make "home made ensure" with very similar amounts of kcals/protein. We discussed other behaviors and methods that could lead to increased energy consumption, such as adding protein powder to recipes, adding extra condiments to meals, eating high fat meats.   Patient says he is slightly confused because his heart MD tells him to avoid the same foods RD is telling him to consume. Explained that there is not one ideal diet for him. RD personally feels that his profound cachexia and continued wt loss are his biggest health concerns.  RD provided handouts related to the educations and coupons for supplements. He likes ensure. Order qid.   Physical exam: devoid of all muscle/fat  Per chart review. Pt continues to lose weight. Likely multifactorial with his recurrent acute illnesses, inability to consume adequate calories given debility and progressive worsening of chronic disease states  Medications:IV abx, Ensure Enlive, ppi, senna Labs: Hyperkalemic, BG: 130-150 mg/dl   Recent Labs Lab 06/17/16 0620 06/18/16 0820  NA 137 138  K 3.1* 5.3*  CL 97* 99*  CO2 31 32  BUN 17 17  CREATININE 0.78 0.84  CALCIUM 8.7* 9.4  GLUCOSE 147* 131*   Diet Order:  Diet Heart Room service appropriate? Yes; Fluid consistency: Thin  Skin:  Reviewed, no issues  Last BM:  2/9  Height:  Ht Readings from Last 1 Encounters:  06/17/16 5'  7" (1.702 m)   Weight:  Wt Readings from Last 1 Encounters:  06/17/16 99 lb (44.9 kg)   Wt Readings from Last 10 Encounters:  06/17/16 99 lb (44.9 kg)  06/02/16 99 lb 10.4 oz (45.2 kg)  04/11/16 100 lb 3.2 oz (45.5 kg)  01/25/16 103 lb 6.4 oz (46.9 kg)  02/19/15 101 lb (45.8 kg)  03/06/14 108 lb (49 kg)  01/16/14 108 lb (49 kg)  11/26/13 114 lb 6.4 oz  (51.9 kg)  09/11/13 110 lb 9.6 oz (50.2 kg)  07/03/13 115 lb (52.2 kg)   Ideal Body Weight:  67.27 kg  BMI:  Body mass index is 15.51 kg/m.  Estimated Nutritional Needs:  Kcal:  1600-1800 kcals (36-40 kcal/kg) Protein:  67-81 g Pro (1.5-1.8 g/kg bw) Fluid:  1.6-1.8 L fluid (1 ml/kg)  EDUCATION NEEDS:  Education needs addressed  Burtis Junes RD, LDN, CNSC Clinical Nutrition Pager: [phone] 06/18/2016 2:33 PM

## 2016-06-18 NOTE — Progress Notes (Signed)
PROGRESS NOTE    KOA BIDERMAN  [id number] DOB: [birth year] DOA: 06/17/2016 PCP: Berkley Harvey, NP   Brief Narrative: Philip Coleman is a 61 y.o. male with a history of severe COPD/chronic respiratory failure on 3-4 L of oxygen at home, ongoing tobacco use, CAD, severe malnutrition, anxiety just discharged from [facility] 10days ago, admitted with COPD exacerbation +/- pneumonia  Assessment & Plan:   Acute on chronic respiratory failure Secondary to Pneumonia/CPD flare -continue Cefepime, stop Vanc -continue iV steroids, nebs, O2 -add flutter valve, mucinex  Pleuritic chest pain due to COPD with exacerbation -as above  CAD with H/o PCI/stenting to LAD in 2015 -stable, EKG and Troponins unremarkable -continue Plavix and statin  Tobacco abuse Patient counseled  Protein calorie malnutrition, severe -add ensure  Hyperlipidemia -Continue statin  Esophageal reflux -Continue PPI  Anxiety disorder -Continue Klonopin -Continue Zoloft  BPH -Continue Flomax  Chronic diastolic congestive heart failure Last echo on 11/22/2013 with an EF of 60-65% and grade 1 diastolic dysfunction -euvolemic, monitor  DVT prophylaxis: Lovenox subcutaneous  Code Status: Full code Family Communication: None at bedside Disposition Plan: Home when symptoms better   Consultants:    none  Procedures:  BiPAP  Antimicrobials:  Ceftriaxone  Azithromycin    Subjective: Breathing improving, pain with cough and deep breath  Objective: Vitals:   06/17/16 2245 06/17/16 2318 06/18/16 0538 06/18/16 0859  BP: 157/81 (!) 157/73 (!) 154/65   Pulse: 100 (!) 115 (!) 101 (!) 103  Resp:  (!) 22 (!) 22 18  Temp:  98.8 F (37.1 C) 98.3 F (36.8 C)   TempSrc:  Oral Oral   SpO2: 94% 90% 90% 90%  Weight:      Height:        Intake/Output Summary (Last 24 hours) at 06/18/16 1055 Last data filed at 06/18/16 0725  Gross per 24 hour  Intake              150 ml  Output              325 ml    Net             -175 ml   Filed Weights   06/17/16 2031  Weight: 44.9 kg (99 lb)    Examination:  General exam: Appears calm and comfortable. Cachectic, appears older than stated age Respiratory system: Decreased breath sounds bilaterally with rare wheezes Cardiovascular system: S1 & S2 heard, RRR. No murmurs. Gastrointestinal system: Abdomen is nondistended, soft and nontender. Normal bowel sounds heard. Central nervous system: Alert and oriented. No focal neurological deficits. Extremities: No edema. No calf tenderness Skin: No cyanosis. No rashes Psychiatry: Judgement and insight appear normal. Mood & affect appropriate.     Data Reviewed: I have personally reviewed following labs and imaging studies  CBC:  Recent Labs Lab 06/17/16 0552 06/18/16 0820  WBC 18.4* 9.6  HGB 10.4* 10.0*  HCT 33.1* 32.9*  MCV 85.3 85.9  PLT 453* 123XX123   Basic Metabolic Panel:  Recent Labs Lab 06/17/16 0620 06/18/16 0820  NA 137 138  K 3.1* 5.3*  CL 97* 99*  CO2 31 32  GLUCOSE 147* 131*  BUN 17 17  CREATININE 0.78 0.84  CALCIUM 8.7* 9.4   GFR: Estimated Creatinine Clearance: 60.9 mL/min (by C-G formula based on SCr of 0.84 mg/dL). Liver Function Tests:  Recent Labs Lab 06/17/16 0620  AST 24  ALT 18  ALKPHOS 105  BILITOT 0.3  PROT 7.1  ALBUMIN 3.5  Recent Labs Lab 06/17/16 0620  LIPASE 16   No results for input(s): AMMONIA in the last 168 hours. Coagulation Profile: No results for input(s): INR, PROTIME in the last 168 hours. Cardiac Enzymes:  Recent Labs Lab 06/17/16 0620 06/18/16 0223 06/18/16 0820  TROPONINI <0.03 <0.03 <0.03   BNP (last 3 results) No results for input(s): PROBNP in the last 8760 hours. HbA1C: No results for input(s): HGBA1C in the last 72 hours. CBG: No results for input(s): GLUCAP in the last 168 hours. Lipid Profile: No results for input(s): CHOL, HDL, LDLCALC, TRIG, CHOLHDL, LDLDIRECT in the last 72 hours. Thyroid Function  Tests: No results for input(s): TSH, T4TOTAL, FREET4, T3FREE, THYROIDAB in the last 72 hours. Anemia Panel: No results for input(s): VITAMINB12, FOLATE, FERRITIN, TIBC, IRON, RETICCTPCT in the last 72 hours. Sepsis Labs: No results for input(s): PROCALCITON, LATICACIDVEN in the last 168 hours.  No results found for this or any previous visit (from the past 240 hour(s)).       Radiology Studies: Dg Chest 2 View  Result Date: 06/17/2016 CLINICAL DATA:  61 y/o M; worsening shortness of breath. History of COPD. EXAM: CHEST  2 VIEW COMPARISON:  06/01/2016 chest radiograph FINDINGS: Stable cardiac silhouette within normal limits. Aortic atherosclerosis with calcification. Emphysema with bullous changes greatest in the left apex and right lung base. Chronic blunting of costal diaphragmatic angles. Left lung base opacity may represent atelectasis or pneumonia. No pneumothorax. IMPRESSION: Left lung base opacity may represent atelectasis or pneumonia. Aortic atherosclerosis. Stable cardiac silhouette. Severe emphysema. Electronically Signed   By: Kristine Garbe M.D.   On: 06/17/2016 06:13        Scheduled Meds: . atorvastatin  40 mg Oral QPM  . ceFEPime (MAXIPIME) IV  1 g Intravenous Q8H  . clopidogrel  75 mg Oral Daily  . enoxaparin (LOVENOX) injection  30 mg Subcutaneous Q24H  . feeding supplement (ENSURE ENLIVE)  237 mL Oral TID BM  . ipratropium-albuterol  3 mL Nebulization QID  . isosorbide mononitrate  15 mg Oral Daily  . methylPREDNISolone (SOLU-MEDROL) injection  40 mg Intravenous Q12H  . nicotine  14 mg Transdermal Daily  . pantoprazole  40 mg Oral Daily  . senna  2 tablet Oral QHS  . sertraline  100 mg Oral Daily  . tamsulosin  0.4 mg Oral QPC supper  . vancomycin  500 mg Intravenous Q12H   Continuous Infusions:   LOS: 0 days     Sujay Grundman [facility] 06/18/2016, 10:55 AM Pager: [phone] If 7PM-7AM, please contact  night-coverage [email or web address] Password [email or web address] 06/18/2016, 10:55 AM

## 2016-06-19 LAB — BASIC METABOLIC PANEL
Anion gap: 7 (ref 5–15)
BUN: 29 mg/dL — AB (ref 6–20)
CHLORIDE: 94 mmol/L — AB (ref 101–111)
CO2: 38 mmol/L — ABNORMAL HIGH (ref 22–32)
CREATININE: 0.88 mg/dL (ref 0.61–1.24)
Calcium: 9.3 mg/dL (ref 8.9–10.3)
GFR calc Af Amer: >60 mL/min (ref >60)
GFR calc non Af Amer: >60 mL/min (ref >60)
Glucose, Bld: 104 mg/dL — ABNORMAL HIGH (ref 65–99)
Potassium: 4.7 mmol/L (ref 3.5–5.1)
SODIUM: 139 mmol/L (ref 135–145)

## 2016-06-19 MED ORDER — ONDANSETRON HCL 4 MG/2ML IJ SOLN: 4.0000 mg | Freq: Four times a day (QID) | INTRAMUSCULAR | Status: DC | PRN

## 2016-06-19 MED ORDER — IPRATROPIUM-ALBUTEROL 0.5-2.5 (3) MG/3ML IN SOLN
3.0000 mL | Freq: Three times a day (TID) | RESPIRATORY_TRACT | Status: DC
  Administered 2016-06-19 – 2016-06-21 (×7): 3 mL via RESPIRATORY_TRACT
  Filled 2016-06-19 (×7): qty 3

## 2016-06-19 MED ORDER — LEVOFLOXACIN 500 MG PO TABS
500.0000 mg | ORAL_TABLET | ORAL | Status: DC
  Administered 2016-06-19 – 2016-06-21 (×3): 500 mg via ORAL
  Filled 2016-06-19 (×3): qty 1

## 2016-06-19 NOTE — Progress Notes (Signed)
RT Note: Flutter valve has been at patients bedside and he is aware of how to utilize it. RT to assist as needed.

## 2016-06-19 NOTE — Care Management Note (Signed)
Case Management Note  Patient Details  Name: Philip Coleman MRN: [id number] Date of Birth: 07-15-1955  Subjective/Objective:  61 y.o. M admitted  With severe COPD. Active with [facility] pta for Home Oxygen, HHRN/PT/OT and SW pta and services will need to be resumed prior to discharge.                  Action/Plan: CM will sign off for now but will be available should additional discharge needs arise or disposition change.    Expected Discharge Date:                  Expected Discharge Plan:  [place]  In-House Referral:  NA  Discharge planning Services  CM Consult  Post Acute Care Choice:  Resumption of Svcs/PTA Provider Choice offered to:  Patient  DME Arranged:  Oxygen DME Agency:  [place] Arranged:  RN, PT, OT, Social Work [employer] Agency:  [place]  Status of Service:  Completed, signed off  If discussed at [employer], dates discussed:    Additional Comments:  Delrae Sawyers, RN 06/19/2016, 11:34 AM

## 2016-06-19 NOTE — Discharge Planning (Signed)
[facility]  Patient Status: ACTIVE  [facility] is providing the following services: PT,OT,SN AND SOCIAL WORK  If patient discharges after hours, please call [phone].   Philip Coleman 06/19/2016, 11:36 AM

## 2016-06-19 NOTE — Progress Notes (Addendum)
PROGRESS NOTE    Philip Coleman  [id number] DOB: 01/24/1956 DOA: 06/17/2016 PCP: Berkley Harvey, NP   Brief Narrative: Philip Coleman is a 61 y.o. male with a history of severe COPD/chronic respiratory failure on 3-4 L of oxygen at home, ongoing tobacco use, CAD, severe malnutrition, anxiety just discharged from [facility] 10days ago, admitted with COPD exacerbation +/- pneumonia  Assessment & Plan:   Acute on chronic respiratory failure Secondary to Pneumonia/CPD flare -stop cefepime  Change to levaquin -continue iV steroids, nebs, O2 -add flutter valve, mucinex -OOB  Pleuritic chest pain due to COPD with exacerbation -as above  CAD with H/o PCI/stenting to LAD in 2015 -stable, EKG and Troponins unremarkable -continue Plavix and statin  Tobacco abuse Patient counseled  Protein calorie malnutrition, severe -add ensure  Hyperlipidemia -Continue statin  Esophageal reflux -Continue PPI  Anxiety disorder -Continue Klonopin -Continue Zoloft  BPH -Continue Flomax  Chronic diastolic congestive heart failure Last echo on 11/22/2013 with an EF of 60-65% and grade 1 diastolic dysfunction -euvolemic, monitor  DVT prophylaxis: Lovenox subcutaneous  Code Status: Full code Family Communication: None at bedside Disposition Plan: Home when symptoms better, 1-2days   Consultants:    none  Procedures:  BiPAP  Antimicrobials:  Ceftriaxone  Azithromycin    Subjective: Still has dyspnea, cough  Objective: Vitals:   06/18/16 2115 06/18/16 2147 06/19/16 0537 06/19/16 0804  BP:  (!) 154/91 (!) 167/94   Pulse:  98 81   Resp:      Temp:  98 F (36.7 C) 97.9 F (36.6 C)   TempSrc:  Oral Oral   SpO2: 97% 98% 100% 97%  Weight:      Height:        Intake/Output Summary (Last 24 hours) at 06/19/16 1041 Last data filed at 06/19/16 0537  Gross per 24 hour  Intake                0 ml  Output             2100 ml  Net            -2100 ml   Filed Weights   06/17/16 2031  Weight: 44.9 kg (99 lb)    Examination:  General exam: Appears calm and comfortable. Extremely Cachectic, appears older than stated age Respiratory system: Decreased breath sounds bilaterally with rare wheezes Cardiovascular system: S1 & S2 heard, RRR. No murmurs. Gastrointestinal system: Abdomen is nondistended, soft and nontender. Normal bowel sounds heard. Central nervous system: Alert and oriented. No focal neurological deficits. Extremities: No edema. No calf tenderness Skin: No cyanosis. No rashes Psychiatry: Judgement and insight appear normal. Mood & affect appropriate.     Data Reviewed: I have personally reviewed following labs and imaging studies  CBC:  Recent Labs Lab 06/17/16 0552 06/18/16 0820  WBC 18.4* 9.6  HGB 10.4* 10.0*  HCT 33.1* 32.9*  MCV 85.3 85.9  PLT 453* 123XX123   Basic Metabolic Panel:  Recent Labs Lab 06/17/16 0620 06/18/16 0820 06/18/16 2348  NA 137 138 139  K 3.1* 5.3* 4.7  CL 97* 99* 94*  CO2 31 32 38*  GLUCOSE 147* 131* 104*  BUN 17 17 29*  CREATININE 0.78 0.84 0.88  CALCIUM 8.7* 9.4 9.3   GFR: Estimated Creatinine Clearance: 58.1 mL/min (by C-G formula based on SCr of 0.88 mg/dL). Liver Function Tests:  Recent Labs Lab 06/17/16 0620  AST 24  ALT 18  ALKPHOS 105  BILITOT 0.3  PROT  7.1  ALBUMIN 3.5    Recent Labs Lab 06/17/16 0620  LIPASE 16   No results for input(s): AMMONIA in the last 168 hours. Coagulation Profile: No results for input(s): INR, PROTIME in the last 168 hours. Cardiac Enzymes:  Recent Labs Lab 06/17/16 0620 06/18/16 0223 06/18/16 0820  TROPONINI <0.03 <0.03 <0.03   BNP (last 3 results) No results for input(s): PROBNP in the last 8760 hours. HbA1C: No results for input(s): HGBA1C in the last 72 hours. CBG: No results for input(s): GLUCAP in the last 168 hours. Lipid Profile: No results for input(s): CHOL, HDL, LDLCALC, TRIG, CHOLHDL, LDLDIRECT in the last 72  hours. Thyroid Function Tests: No results for input(s): TSH, T4TOTAL, FREET4, T3FREE, THYROIDAB in the last 72 hours. Anemia Panel: No results for input(s): VITAMINB12, FOLATE, FERRITIN, TIBC, IRON, RETICCTPCT in the last 72 hours. Sepsis Labs: No results for input(s): PROCALCITON, LATICACIDVEN in the last 168 hours.  No results found for this or any previous visit (from the past 240 hour(s)).       Radiology Studies: No results found.      Scheduled Meds: . atorvastatin  40 mg Oral QPM  . clopidogrel  75 mg Oral Daily  . enoxaparin (LOVENOX) injection  30 mg Subcutaneous Q24H  . feeding supplement (ENSURE ENLIVE)  237 mL Oral QID  . ipratropium-albuterol  3 mL Nebulization TID  . isosorbide mononitrate  15 mg Oral Daily  . levofloxacin  500 mg Oral Daily  . methylPREDNISolone (SOLU-MEDROL) injection  40 mg Intravenous Q12H  . nicotine  14 mg Transdermal Daily  . pantoprazole  40 mg Oral Daily  . senna  2 tablet Oral QHS  . sertraline  100 mg Oral Daily  . tamsulosin  0.4 mg Oral QPC supper   Continuous Infusions:   LOS: 1 day     Keyandre Pileggi [facility] 06/19/2016, 10:41 AM Pager: [phone] If 7PM-7AM, please contact night-coverage [email or web address] Password [email or web address] 06/19/2016, 10:41 AM

## 2016-06-20 ENCOUNTER — Inpatient Hospital Stay: Payer: Medicare Other | Admitting: Pulmonary Disease

## 2016-06-20 LAB — TRIGLYCERIDES: TRIGLYCERIDES: 91 mg/dL (ref <150)

## 2016-06-20 MED ORDER — PREDNISONE 50 MG PO TABS
50.0000 mg | ORAL_TABLET | Freq: Every day | ORAL | Status: DC
  Administered 2016-06-20 – 2016-06-21 (×2): 50 mg via ORAL
  Filled 2016-06-20 (×2): qty 1

## 2016-06-20 NOTE — Progress Notes (Signed)
Pt Bp: 176/93. No PRN orders. Notified K. Schorr on call. Will continue to monitor and notify as needed

## 2016-06-20 NOTE — Consult Note (Signed)
   [facility] CM Inpatient Consult   06/20/2016  [name] Oct 04, 1955 [id number]   Received [facility] Care Management referral. Spoke with Philip Coleman at bedside who endorses, his Primary Care Provider is Eldridge Abrahams, NP. Unfortunately patient is not eligible for [facility] Care Management at this time due to his Primary Care Provider is not a [facility] Provider. Made inpatient RNCM aware.   Marthenia Rolling, MSN-Ed, RN,BSN [facility] Liaison [phone]

## 2016-06-20 NOTE — Evaluation (Signed)
Physical Therapy Evaluation Patient Details Name: Philip Coleman MRN: [id number] DOB: 10-27-1955 Today's Date: 06/20/2016   History of Present Illness  Philip Coleman is a 61 y.o. male with a history of severe COPD/chronic respiratory failure on 3-4 L of oxygen at home, ongoing tobacco use, CAD, severe malnutrition, anxiety just discharged from [facility] 10days ago, admitted with COPD exacerbation +/- pneumonia  Clinical Impression  Pt admitted with above diagnosis. Pt currently with functional limitations due to the deficits listed below (see PT Problem List). Pt is limited by DOE however does know his limitations and rests prn.  Pt would benefit from rollator for home use as well as HHPT and HHOT.  Pt agrees.  Will follow acutely.   Pt will benefit from skilled PT to increase their independence and safety with mobility to allow discharge to the venue listed below.      Follow Up Recommendations Home health PT;Supervision - Intermittent (HHAide and HHOT)    Equipment Recommendations  Other (comment) (rollator (only if covered 100% per pt))    Recommendations for Other Services       Precautions / Restrictions Precautions Precautions: Fall Precaution Comments: home O2     3 - 4 lts Restrictions Weight Bearing Restrictions: No      Mobility  Bed Mobility Overal bed mobility: Modified Independent                Transfers Overall transfer level: Needs assistance Equipment used: None Transfers: Sit to/from Stand Sit to Stand: Min guard         General transfer comment: can stand statically without UE support.  Ambulation/Gait Ambulation/Gait assistance: Min guard Ambulation Distance (Feet): 5 Feet Assistive device: Rolling walker (2 wheeled);None Gait Pattern/deviations: Step-through pattern;Decreased stride length;Narrow base of support Gait velocity: decreased  Gait velocity interpretation: Below normal speed for age/gender General Gait Details: pt rquired 3L to  achieve sats 88%  or >     Pt aware of his own activity limitation and when to stop and rest.  Pt demonstarted 2/4 DOE and began to perform purse lip breathing which he did correctly and without prompting.  Feel he is more steady with a RW.  Recommend rollator so pt can stop and rest when needed.    Stairs            Wheelchair Mobility    Modified Rankin (Stroke Patients Only)       Balance Overall balance assessment: Needs assistance;History of Falls Sitting-balance support: No upper extremity supported;Feet supported Sitting balance-Leahy Scale: Good     Standing balance support: No upper extremity supported;During functional activity Standing balance-Leahy Scale: Fair Standing balance comment: Pt did not need UE support for balance.               High level balance activites: Direction changes;Turns;Sudden stops;Backward walking High Level Balance Comments: Does better with UE support with challenges.              Pertinent Vitals/Pain Pain Assessment: No/denies pain  O2 90-92% on 3LO2 with short distance ambulation as pt refused to walk further.    Home Living Family/patient expects to be discharged to:: Private residence Living Arrangements: Non-relatives/Friends (2 roommmates - Debbie and Jackelyn Poling) Available Help at Discharge: Personal care attendant;Available PRN/intermittently;Friend(s) (aide 2.5 hours day 7 days week) Type of Home: House Home Access: Stairs to enter Entrance Stairs-Rails: Right Entrance Stairs-Number of Steps: 7 Home Layout: One level Home Equipment: Walker - standard;Cane - single point;Bedside commode;Shower seat;Hand  held shower head;Wheelchair - Education administrator (comment) (home O2)      Prior Function Level of Independence: Independent               Hand Dominance        Extremity/Trunk Assessment   Upper Extremity Assessment Upper Extremity Assessment: Defer to OT evaluation    Lower Extremity Assessment Lower  Extremity Assessment: Generalized weakness    Cervical / Trunk Assessment Cervical / Trunk Assessment: Kyphotic  Communication   Communication: No difficulties  Cognition Arousal/Alertness: Awake/alert Behavior During Therapy: WFL for tasks assessed/performed Overall Cognitive Status: Within Functional Limits for tasks assessed                      General Comments      Exercises     Assessment/Plan    PT Assessment Patient needs continued PT services  PT Problem List Decreased strength;Decreased range of motion;Decreased activity tolerance;Decreased balance;Decreased mobility;Cardiopulmonary status limiting activity          PT Treatment Interventions DME instruction;Gait training;Stair training;Functional mobility training;Therapeutic activities;Therapeutic exercise;Patient/family education    PT Goals (Current goals can be found in the Care Plan section)  Acute Rehab PT Goals Patient Stated Goal: to breathe better PT Goal Formulation: With patient Time For Goal Achievement: 07/04/16 Potential to Achieve Goals: Good    Frequency Min 3X/week   Barriers to discharge Decreased caregiver support      Co-evaluation               End of Session Equipment Utilized During Treatment: Oxygen;Gait belt Activity Tolerance: Patient limited by fatigue Patient left: in bed;with call bell/phone within reach;with bed alarm set Nurse Communication: Mobility status         Time: [id number] PT Time Calculation (min) (ACUTE ONLY): 20 min   Charges:   PT Evaluation $PT Eval Moderate Complexity: 1 Procedure     PT G Codes:        Godfrey Pick Ayen Viviano 07/11/2016, 10:02 AM Amanda Cockayne Acute Rehabilitation [phone] (pager)

## 2016-06-20 NOTE — Progress Notes (Signed)
PROGRESS NOTE    Philip Coleman  [id number] DOB: 20-Jun-1955 DOA: 06/17/2016 PCP: Berkley Harvey, NP   Brief Narrative: Philip Coleman is a 61 y.o. male with a history of severe COPD/chronic respiratory failure on 3-4 L of oxygen at home, ongoing tobacco use, CAD, severe malnutrition, anxiety just discharged from [facility] 10days ago, admitted with COPD exacerbation +/- pneumonia  Assessment & Plan:   Acute on chronic respiratory failure Secondary to Pneumonia/COPD flare -stop cefepime, changed to levaquin -cut down iV steroids-change to prednisone, nebs, O2 -flutter valve, mucinex -OOB, Ambulate, counseled again abt smoking -Home tomorrow  Pleuritic chest pain due to COPD with exacerbation -as above  CAD with H/o PCI/stenting to LAD in 2015 -stable, EKG and Troponins unremarkable -continue Plavix and statin  Tobacco abuse Patient counseled  Protein calorie malnutrition, severe -add ensure  Hyperlipidemia -Continue statin  Esophageal reflux -Continue PPI  Anxiety disorder -Continue Klonopin -Continue Zoloft  BPH -Continue Flomax  Chronic diastolic congestive heart failure Last echo on 11/22/2013 with an EF of 60-65% and grade 1 diastolic dysfunction -euvolemic, monitor  DVT prophylaxis: Lovenox subcutaneous  Code Status: Full code Family Communication: None at bedside Disposition Plan: Home tomorrow  Consultants:    none  Procedures:  BiPAP  Antimicrobials:  Ceftriaxone  Azithromycin    Subjective: Improving, not at baseline yet, " I dont feel well enough to go home today"  Objective: Vitals:   06/19/16 2228 06/20/16 0634 06/20/16 0723 06/20/16 0914  BP: (!) 143/84 (!) 176/93    Pulse: (!) 102 93    Resp: 20 18    Temp: 98.5 F (36.9 C) 98.3 F (36.8 C)    TempSrc: Oral Oral    SpO2: 98% 100% 99% 96%  Weight:      Height:        Intake/Output Summary (Last 24 hours) at 06/20/16 1026 Last data filed at 06/19/16 1640  Gross per 24  hour  Intake                0 ml  Output              500 ml  Net             -500 ml   Filed Weights   06/17/16 2031  Weight: 44.9 kg (99 lb)    Examination:  General exam: Appears calm and comfortable. Extremely Cachectic, appears older than stated age Respiratory system: improved air movement, occasional wheezes Cardiovascular system: S1 & S2 heard, RRR. No murmurs. Gastrointestinal system: Abdomen is nondistended, soft and nontender. Normal bowel sounds heard. Central nervous system: Alert and oriented. No focal neurological deficits. Extremities: No edema. No calf tenderness Skin: No cyanosis. No rashes Psychiatry: Judgement and insight appear normal. Mood & affect appropriate.     Data Reviewed: I have personally reviewed following labs and imaging studies  CBC:  Recent Labs Lab 06/17/16 0552 06/18/16 0820  WBC 18.4* 9.6  HGB 10.4* 10.0*  HCT 33.1* 32.9*  MCV 85.3 85.9  PLT 453* [id number]   Basic Metabolic Panel:  Recent Labs Lab 06/17/16 0620 06/18/16 0820 06/18/16 2348  NA 137 138 139  K 3.1* 5.3* 4.7  CL 97* 99* 94*  CO2 31 32 38*  GLUCOSE 147* 131* 104*  BUN 17 17 29*  CREATININE 0.78 0.84 0.88  CALCIUM 8.7* 9.4 9.3   GFR: Estimated Creatinine Clearance: 58.1 mL/min (by C-G formula based on SCr of 0.88 mg/dL). Liver Function Tests:  Recent Labs  Lab 06/17/16 0620  AST 24  ALT 18  ALKPHOS 105  BILITOT 0.3  PROT 7.1  ALBUMIN 3.5    Recent Labs Lab 06/17/16 0620  LIPASE 16   No results for input(s): AMMONIA in the last 168 hours. Coagulation Profile: No results for input(s): INR, PROTIME in the last 168 hours. Cardiac Enzymes:  Recent Labs Lab 06/17/16 0620 06/18/16 0223 06/18/16 0820  TROPONINI <0.03 <0.03 <0.03   BNP (last 3 results) No results for input(s): PROBNP in the last 8760 hours. HbA1C: No results for input(s): HGBA1C in the last 72 hours. CBG: No results for input(s): GLUCAP in the last 168 hours. Lipid  Profile: No results for input(s): CHOL, HDL, LDLCALC, TRIG, CHOLHDL, LDLDIRECT in the last 72 hours. Thyroid Function Tests: No results for input(s): TSH, T4TOTAL, FREET4, T3FREE, THYROIDAB in the last 72 hours. Anemia Panel: No results for input(s): VITAMINB12, FOLATE, FERRITIN, TIBC, IRON, RETICCTPCT in the last 72 hours. Sepsis Labs: No results for input(s): PROCALCITON, LATICACIDVEN in the last 168 hours.  Recent Results (from the past 240 hour(s))  Culture, blood (routine x 2) Call MD if unable to obtain prior to antibiotics being given     Status: None (Preliminary result)   Collection Time: 06/18/16  2:23 AM  Result Value Ref Range Status   Specimen Description BLOOD LEFT ANTECUBITAL  Final   Special Requests BOTTLES DRAWN AEROBIC AND ANAEROBIC 8CC  Final   Culture NO GROWTH 1 DAY  Final   Report Status PENDING  Incomplete  Culture, blood (routine x 2) Call MD if unable to obtain prior to antibiotics being given     Status: None (Preliminary result)   Collection Time: 06/18/16  2:31 AM  Result Value Ref Range Status   Specimen Description BLOOD LEFT FOREARM  Final   Special Requests BOTTLES DRAWN AEROBIC AND ANAEROBIC 5CC  Final   Culture NO GROWTH 1 DAY  Final   Report Status PENDING  Incomplete         Radiology Studies: No results found.      Scheduled Meds: . atorvastatin  40 mg Oral QPM  . clopidogrel  75 mg Oral Daily  . enoxaparin (LOVENOX) injection  30 mg Subcutaneous Q24H  . feeding supplement (ENSURE ENLIVE)  237 mL Oral QID  . ipratropium-albuterol  3 mL Nebulization TID  . isosorbide mononitrate  15 mg Oral Daily  . levofloxacin  500 mg Oral Q24H  . nicotine  14 mg Transdermal Daily  . pantoprazole  40 mg Oral Daily  . predniSONE  50 mg Oral Q breakfast  . senna  2 tablet Oral QHS  . sertraline  100 mg Oral Daily  . tamsulosin  0.4 mg Oral QPC supper   Continuous Infusions:   LOS: 2 days     Tajuanna Burnett [facility] 06/20/2016,  10:26 AM Pager: [phone] If 7PM-7AM, please contact night-coverage [email or web address] Password [facility] 06/20/2016, 10:26 AM

## 2016-06-21 MED ORDER — LEVOFLOXACIN 500 MG PO TABS: 500.0000 mg | ORAL_TABLET | ORAL | 0 refills | Status: DC

## 2016-06-21 MED ORDER — PREDNISONE 20 MG PO TABS: 20.0000 mg | ORAL_TABLET | Freq: Every day | ORAL | 0 refills | Status: DC

## 2016-06-21 NOTE — Evaluation (Signed)
Occupational Therapy Evaluation Patient Details Name: Philip Coleman MRN: [id number] DOB: September 04, 1955 Today's Date: 06/21/2016    History of Present Illness Philip Coleman is a 61 y.o. male with a history of severe COPD/chronic respiratory failure on 3-4 L of oxygen at home, ongoing tobacco use, CAD, severe malnutrition, anxiety just discharged from [facility] 10days ago, admitted with COPD exacerbation +/- pneumonia   Clinical Impression   Patient presenting with decreased I in self care, balance, functional transfers/mobility, strength, and safety awareness. Patient reports being mod I with AD for ambulation but needing assistance for aide for ADLs PTA. Patient currently functioning at overall min A. Patient will benefit from acute OT to increase overall independence in the areas of ADLs, functional mobility, and safety in order to safely discharge home.     Follow Up Recommendations  Home health OT;Supervision - Intermittent    Equipment Recommendations  None recommended by OT    Recommendations for Other Services       Precautions / Restrictions Precautions Precautions: Fall Precaution Comments: home O2     3 - 4 lts Restrictions Weight Bearing Restrictions: No      Mobility Bed Mobility Overal bed mobility: Modified Independent      Transfers Overall transfer level: Needs assistance Equipment used: None Transfers: Sit to/from Stand Sit to Stand: Min guard Stand pivot transfers: Min guard       General transfer comment: min guard for stand pivot transfer from bed <> BSC    Balance Overall balance assessment: Needs assistance;History of Falls Sitting-balance support: No upper extremity supported;Feet supported Sitting balance-Leahy Scale: Good     Standing balance support: No upper extremity supported;During functional activity Standing balance-Leahy Scale: Fair Standing balance comment: Pt did not need UE support for balance.            ADL Overall ADL's :  Needs assistance/impaired           General ADL Comments: Upon entering the room, pt supine in bed with no c/o pain. Pt wearing jeans he reports he donned with set up A from bed level. Pt demonstrated stand pivot transfer from bed > BSC with min guard for balance.                Pertinent Vitals/Pain Pain Assessment: No/denies pain     Hand Dominance Right   Extremity/Trunk Assessment Upper Extremity Assessment Upper Extremity Assessment: Generalized weakness   Lower Extremity Assessment Lower Extremity Assessment: Generalized weakness   Cervical / Trunk Assessment Cervical / Trunk Assessment: Kyphotic   Communication Communication Communication: No difficulties   Cognition Arousal/Alertness: Awake/alert Behavior During Therapy: WFL for tasks assessed/performed Overall Cognitive Status: Within Functional Limits for tasks assessed                                Home Living Family/patient expects to be discharged to:: Private residence Living Arrangements: Non-relatives/Friends Available Help at Discharge: Personal care attendant;Available PRN/intermittently;Friend(s) (aide 2.5 hrs/7 days a week) Type of Home: House Home Access: Stairs to enter [employer] of Steps: 7 Entrance Stairs-Rails: Right Home Layout: One level     Bathroom Shower/Tub: Teacher, early years/pre: Standard Bathroom Accessibility: Yes   Home Equipment: Walker - standard;Cane - single point;Bedside commode;Shower seat;Hand held shower head;Wheelchair - manual;Other (comment)   Additional Comments: aide 2 hours /day      Prior Functioning/Environment Level of Independence: Independent with assistive device(s);Needs  assistance    ADL's / Homemaking Assistance Needed: aide provided assistance for ADLs and IADLs             OT Problem List: Decreased strength;Decreased activity tolerance;Impaired balance (sitting and/or standing);Decreased safety  awareness;Decreased knowledge of use of DME or AE   OT Treatment/Interventions: Self-care/ADL training;Therapeutic exercise;Energy conservation;DME and/or AE instruction;Therapeutic activities;Patient/family education    OT Goals(Current goals can be found in the care plan section) Acute Rehab OT Goals Patient Stated Goal: to get better and go home OT Goal Formulation: With patient Time For Goal Achievement: 07/05/16 Potential to Achieve Goals: Good ADL Goals Pt Will Perform Grooming: with supervision Pt Will Perform Upper Body Bathing: with supervision Pt Will Perform Lower Body Bathing: with supervision Pt Will Perform Upper Body Dressing: with supervision Pt Will Perform Lower Body Dressing: with supervision Pt Will Transfer to Toilet: with supervision Pt Will Perform Toileting - Clothing Manipulation and hygiene: with supervision Pt Will Perform Tub/Shower Transfer: with supervision  OT Frequency: Min 2X/week   Barriers to D/C:    none known at this time          End of Session Equipment Utilized During Treatment: Oxygen  Activity Tolerance: Patient tolerated treatment well Patient left: in bed;with bed alarm set;with call bell/phone within reach   Time: [id number] OT Time Calculation (min): 14 min Charges:  OT General Charges $OT Visit: 1 Procedure OT Evaluation $OT Eval Moderate Complexity: 1 Procedure G-Codes:    Gypsy Decant, MS, OTR/L, CBIS 06/21/2016, 12:11 PM

## 2016-06-21 NOTE — Care Management Note (Signed)
Case Management Note  Patient Details  Name: Philip Coleman MRN: [id number] Date of Birth: [birth year]  Subjective/Objective:                 Admitted with HCAP.   Action/Plan: Plan is to d/c to home with home health services in place. CM will arranged for [facility] ([phone]) to provide transportation to home once d/c.  Expected Discharge Date:  06/21/16               Expected Discharge Plan:  [place]  In-House Referral:  NA  Discharge planning Services  CM Consult  Post Acute Care Choice:  Resumption of Svcs/PTA Provider Choice offered to:  Patient  DME Arranged:  Oxygen DME Agency:  [place] Arranged:  RN, PT, OT, Social Work [employer] Agency:  [place]  Status of Service:  Completed, signed off  If discussed at [employer], dates discussed:    Additional Comments:  Sharin Mons, RN 06/21/2016, 11:50 AM

## 2016-06-21 NOTE — Discharge Summary (Signed)
Physician Discharge Summary  Philip Coleman [id number] DOB: [birth year] DOA: 06/17/2016  PCP: Berkley Harvey, NP  Admit date: 06/17/2016 Discharge date: 06/21/2016  Time spent: 45 minutes  Recommendations for Outpatient Follow-up:  1. PCP in 1 week  Discharge Diagnoses:    Acute on chronic respiratory failure (HCC)   HCAP (healthcare-associated pneumonia)   COPD exacerbation :   COPD with emphysema ([place])   Tobacco abuse   Protein-calorie malnutrition, severe ([place])   COPD exacerbation (HCC)   CAD in native artery   Esophageal reflux   Anxiety disorder   Discharge Condition: stable  Diet recommendation:  Heart healthy  Filed Weights   06/17/16 2031  Weight: 44.9 kg (99 lb)    History of present illness:  Philip Coleman is a 61 y.o. male with a history of severe COPD/chronic respiratory failure on 3-4 L of oxygen at home, ongoing tobacco use, CAD, severe malnutrition, anxiety just discharged from [facility] 10days ago, admitted with COPD exacerbation +/- pneumonia  Hospital Course:  Acute on chronic respiratory failure Secondary to Pneumonia/COPD flare -improved with Abx/steroids/nebs/flutter valve, mucinex -weaned down to 3L which is his baseline, ambulated with PT -discharged home on levaquin, prednisone taper  Pleuritic chest pain due to COPD with exacerbation -as above  CAD with H/o PCI/stenting to LAD in 2015 -stable, EKG and Troponins negative -continued Plavix and statin  Tobacco abuse -Patient counseled extensively, poorly motivated to quit smoking  Protein calorie malnutrition, severe -added ensure  Hyperlipidemia -Continue statin  Esophageal reflux -Continue PPI  Anxiety disorder -Continue Klonopin -Continue Zoloft   BPH -Continue Flomax  Chronic diastolic congestive heart failure Last echo on 11/22/2013 with an EF of 60-65% and grade 1 diastolic dysfunction -euvolemic, monitor   Consultations:    Discharge Exam: Vitals:    06/20/16 2000 06/21/16 0557  BP: (!) 153/89 (!) 171/101  Pulse: (!) 111 79  Resp: 20 18  Temp: 99.9 F (37.7 C) 97.8 F (36.6 C)    General: AAOx3, cachectic Cardiovascular: S1S2/RRR Respiratory: improved air movement  Discharge Instructions   Discharge Instructions    Diet - low sodium heart healthy    Complete by:  As directed    Increase activity slowly    Complete by:  As directed      Current Discharge Medication List    START taking these medications   Details  levofloxacin (LEVAQUIN) 500 MG tablet Take 1 tablet (500 mg total) by mouth daily. For 4days Qty: 4 tablet, Refills: 0      CONTINUE these medications which have CHANGED   Details  predniSONE (DELTASONE) 20 MG tablet Take 1-2 tablets (20-40 mg total) by mouth daily with breakfast. Take 40mg  for 2days then 20mg  for 2days then STOP Qty: 8 tablet, Refills: 0      CONTINUE these medications which have NOT CHANGED   Details  albuterol (PROVENTIL HFA;VENTOLIN HFA) 108 (90 Base) MCG/ACT inhaler Inhale 2 puffs into the lungs every 6 (six) hours as needed for wheezing or shortness of breath.    albuterol (PROVENTIL) (2.5 MG/3ML) 0.083% nebulizer solution Take 2.5 mg by nebulization every 6 (six) hours as needed for wheezing or shortness of breath.    atorvastatin (LIPITOR) 40 MG tablet Take 40 mg by mouth every evening.    budesonide-formoterol (SYMBICORT) 160-4.5 MCG/ACT inhaler Inhale 2 puffs into the lungs 2 (two) times daily.    clonazePAM (KLONOPIN) 0.5 MG tablet Take 0.5 mg by mouth 2 (two) times daily as needed for anxiety.  clopidogrel (PLAVIX) 75 MG tablet Take 75 mg by mouth daily.    feeding supplement, ENSURE ENLIVE, (ENSURE ENLIVE) LIQD Take 237 mLs by mouth 3 (three) times daily between meals. Qty: 711 mL, Refills: 30    Ipratropium-Albuterol (COMBIVENT RESPIMAT) 20-100 MCG/ACT AERS respimat Inhale 1 puff into the lungs every 6 (six) hours as needed for wheezing or shortness of breath.     isosorbide mononitrate (IMDUR) 30 MG 24 hr tablet Take 15 mg by mouth daily.    nitroGLYCERIN (NITROSTAT) 0.4 MG SL tablet Place 1 tablet (0.4 mg total) under the tongue every 5 (five) minutes as needed for chest pain. Qty: 25 tablet, Refills: 3    pantoprazole (PROTONIX) 40 MG tablet Take 1 tablet (40 mg total) by mouth daily. Qty: 90 tablet, Refills: 3   Associated Diagnoses: Hyperlipidemia; Coronary artery disease involving native coronary artery of native heart without angina pectoris    sertraline (ZOLOFT) 100 MG tablet Take 100 mg by mouth daily.    tamsulosin (FLOMAX) 0.4 MG CAPS capsule Take 0.4 mg by mouth daily after supper.     traMADol (ULTRAM) 50 MG tablet Take 100 mg by mouth every 12 (twelve) hours as needed for moderate pain.       Allergies  Allergen Reactions  . Codeine Hives and Nausea And Vomiting   Follow-up Information    Berkley Harvey, NP. Schedule an appointment as soon as possible for a visit on 06/27/2016.   Specialty:  Nurse Practitioner Why:  Appointment is on 06/27/16 at 10:45am Contact information: [address] [place] [address] [phone]        [place] Follow up.   Why:  Home health services arranged, office will call and set up home visits Contact information: [address] [place] [address] [phone]        [facility] Follow up.   Why:  home oxygen in place Contact information: [address] [place] [address] [phone]            The results of significant diagnostics from this hospitalization (including imaging, microbiology, ancillary and laboratory) are listed below for reference.    Significant Diagnostic Studies: Dg Chest 2 View  Result Date: 06/17/2016 CLINICAL DATA:  61 y/o M; worsening shortness of breath. History of COPD. EXAM: CHEST  2 VIEW COMPARISON:  06/01/2016 chest radiograph FINDINGS: Stable cardiac silhouette within normal  limits. Aortic atherosclerosis with calcification. Emphysema with bullous changes greatest in the left apex and right lung base. Chronic blunting of costal diaphragmatic angles. Left lung base opacity may represent atelectasis or pneumonia. No pneumothorax. IMPRESSION: Left lung base opacity may represent atelectasis or pneumonia. Aortic atherosclerosis. Stable cardiac silhouette. Severe emphysema. Electronically Signed   By: Kristine Garbe M.D.   On: 06/17/2016 06:13   Ct Head Wo Contrast  Result Date: 06/04/2016 CLINICAL DATA:  Headache EXAM: CT HEAD WITHOUT CONTRAST TECHNIQUE: Contiguous axial images were obtained from the base of the skull through the vertex without intravenous contrast. COMPARISON:  None. FINDINGS: Brain: Patchy chronic microvascular disease throughout the deep white matter. No acute intracranial abnormality. Specifically, no hemorrhage, hydrocephalus, mass lesion, acute infarction, or significant intracranial injury. Vascular: No hyperdense vessel or unexpected calcification. Skull: No acute calvarial abnormality. Sinuses/Orbits: Visualized paranasal sinuses and mastoids clear. Orbital soft tissues unremarkable. Other: None IMPRESSION: Patchy chronic microvascular disease. No acute intracranial abnormality. Electronically Signed   By: Rolm Baptise M.D.   On:  06/04/2016 11:35   Dg Chest Portable 1 View  Result Date: 06/02/2016 CLINICAL DATA:  Acute onset of shortness of breath and wheezing. Initial encounter. EXAM: PORTABLE CHEST 1 VIEW COMPARISON:  Chest radiograph performed 11/26/2013 FINDINGS: The lungs are well-aerated. Vascular congestion is noted. Left midlung opacity may reflect mild pneumonia. Underlying emphysema is noted bilaterally. Chronic bibasilar pleural thickening is noted. There is no evidence of significant pleural effusion or pneumothorax. The cardiomediastinal silhouette is within normal limits. No acute osseous abnormalities are seen. IMPRESSION: Vascular  congestion noted. Left midlung opacity may reflect mild pneumonia, depending on the patient's symptoms. Underlying bilateral emphysema noted. Electronically Signed   By: Garald Balding M.D.   On: 06/02/2016 00:39    Microbiology: Recent Results (from the past 240 hour(s))  Culture, blood (routine x 2) Call MD if unable to obtain prior to antibiotics being given     Status: None (Preliminary result)   Collection Time: 06/18/16  2:23 AM  Result Value Ref Range Status   Specimen Description BLOOD LEFT ANTECUBITAL  Final   Special Requests BOTTLES DRAWN AEROBIC AND ANAEROBIC 8CC  Final   Culture NO GROWTH 2 DAYS  Final   Report Status PENDING  Incomplete  Culture, blood (routine x 2) Call MD if unable to obtain prior to antibiotics being given     Status: None (Preliminary result)   Collection Time: 06/18/16  2:31 AM  Result Value Ref Range Status   Specimen Description BLOOD LEFT FOREARM  Final   Special Requests BOTTLES DRAWN AEROBIC AND ANAEROBIC 5CC  Final   Culture NO GROWTH 2 DAYS  Final   Report Status PENDING  Incomplete     Labs: Basic Metabolic Panel:  Recent Labs Lab 06/17/16 0620 06/18/16 0820 06/18/16 2348  NA 137 138 139  K 3.1* 5.3* 4.7  CL 97* 99* 94*  CO2 31 32 38*  GLUCOSE 147* 131* 104*  BUN 17 17 29*  CREATININE 0.78 0.84 0.88  CALCIUM 8.7* 9.4 9.3   Liver Function Tests:  Recent Labs Lab 06/17/16 0620  AST 24  ALT 18  ALKPHOS 105  BILITOT 0.3  PROT 7.1  ALBUMIN 3.5    Recent Labs Lab 06/17/16 0620  LIPASE 16   No results for input(s): AMMONIA in the last 168 hours. CBC:  Recent Labs Lab 06/17/16 0552 06/18/16 0820  WBC 18.4* 9.6  HGB 10.4* 10.0*  HCT 33.1* 32.9*  MCV 85.3 85.9  PLT 453* 381   Cardiac Enzymes:  Recent Labs Lab 06/17/16 0620 06/18/16 0223 06/18/16 0820  TROPONINI <0.03 <0.03 <0.03   BNP: BNP (last 3 results)  Recent Labs  06/02/16 0023  BNP 189.1*    ProBNP (last 3 results) No results for input(s):  PROBNP in the last 8760 hours.  CBG: No results for input(s): GLUCAP in the last 168 hours.     SignedDomenic Polite MD.  [facility] 06/21/2016, 12:35 PM

## 2016-06-21 NOTE — Progress Notes (Signed)
Nsg Discharge Note  Admit Date:  06/17/2016 Discharge date: 06/21/2016   [name] to be D/C'd Home per MD order.  AVS completed.  Copy for chart, and copy for patient signed, and dated. Patient/caregiver able to verbalize understanding.  Discharge Medication: Allergies as of 06/21/2016      Reactions   Codeine Hives, Nausea And Vomiting      Medication List    TAKE these medications   albuterol (2.5 MG/3ML) 0.083% nebulizer solution Commonly known as:  PROVENTIL Take 2.5 mg by nebulization every 6 (six) hours as needed for wheezing or shortness of breath.   albuterol 108 (90 Base) MCG/ACT inhaler Commonly known as:  PROVENTIL HFA;VENTOLIN HFA Inhale 2 puffs into the lungs every 6 (six) hours as needed for wheezing or shortness of breath.   atorvastatin 40 MG tablet Commonly known as:  LIPITOR Take 40 mg by mouth every evening.   budesonide-formoterol 160-4.5 MCG/ACT inhaler Commonly known as:  SYMBICORT Inhale 2 puffs into the lungs 2 (two) times daily.   clonazePAM 0.5 MG tablet Commonly known as:  KLONOPIN Take 0.5 mg by mouth 2 (two) times daily as needed for anxiety.   clopidogrel 75 MG tablet Commonly known as:  PLAVIX Take 75 mg by mouth daily.   COMBIVENT RESPIMAT 20-100 MCG/ACT Aers respimat Generic drug:  Ipratropium-Albuterol Inhale 1 puff into the lungs every 6 (six) hours as needed for wheezing or shortness of breath.   feeding supplement (ENSURE ENLIVE) Liqd Take 237 mLs by mouth 3 (three) times daily between meals.   isosorbide mononitrate 30 MG 24 hr tablet Commonly known as:  IMDUR Take 15 mg by mouth daily.   levofloxacin 500 MG tablet Commonly known as:  LEVAQUIN Take 1 tablet (500 mg total) by mouth daily. For 4days   nitroGLYCERIN 0.4 MG SL tablet Commonly known as:  NITROSTAT Place 1 tablet (0.4 mg total) under the tongue every 5 (five) minutes as needed for chest pain.   pantoprazole 40 MG tablet Commonly known as:  PROTONIX Take  1 tablet (40 mg total) by mouth daily.   predniSONE 20 MG tablet Commonly known as:  DELTASONE Take 1-2 tablets (20-40 mg total) by mouth daily with breakfast. Take 40mg  for 2days then 20mg  for 2days then STOP What changed:  how much to take  additional instructions   sertraline 100 MG tablet Commonly known as:  ZOLOFT Take 100 mg by mouth daily.   tamsulosin 0.4 MG Caps capsule Commonly known as:  FLOMAX Take 0.4 mg by mouth daily after supper.   traMADol 50 MG tablet Commonly known as:  ULTRAM Take 100 mg by mouth every 12 (twelve) hours as needed for moderate pain.       Discharge Assessment: Vitals:   06/20/16 2000 06/21/16 0557  BP: (!) 153/89 (!) 171/101  Pulse: (!) 111 79  Resp: 20 18  Temp: 99.9 F (37.7 C) 97.8 F (36.6 C)   Skin clean, dry and intact without evidence of skin break down, no evidence of skin tears noted. IV catheter discontinued intact. Site without signs and symptoms of complications - no redness or edema noted at insertion site, patient denies c/o pain - only slight tenderness at site.  Dressing with slight pressure applied.  D/c Instructions-Education: Discharge instructions given to patient/family with verbalized understanding. D/c education completed with patient/family including follow up instructions, medication list, d/c activities limitations if indicated, with other d/c instructions as indicated by MD - patient able to verbalize understanding, all  questions fully answered. Patient instructed to return to ED, call 911, or call MD for any changes in condition.  Patient transported via [place] home.   Salley Slaughter, RN 06/21/2016 12:32 PM

## 2016-06-21 NOTE — Progress Notes (Signed)
Physical Therapy Treatment Patient Details Name: DRAYSEN LOWERY MRN: [id number] DOB: 11-14-1955 Today's Date: 06/21/2016    History of Present Illness DYSEN HEINTZELMAN is a 61 y.o. male with a history of severe COPD/chronic respiratory failure on 3-4 L of oxygen at home, ongoing tobacco use, CAD, severe malnutrition, anxiety just discharged from [facility] 10days ago, admitted with COPD exacerbation +/- pneumonia    PT Comments    Pt performed improved gait distance but remains to require multiple rest breaks due to fatigue, weakness and increased work of breathing.  Pt required cues for increasing BOS and using reciprocal armswing.  PTA educated patient on use of AD at home to reduce risk of falls.  Pt report he grabs to furniture and door frames at home.  Educated on continued ambulation to improve endurance. Pt will remain to benefit from skilled therapy to improve endurance and develop a home routine to improve and maintain strength/function.    Follow Up Recommendations  Home health PT;Supervision - Intermittent ([place] aide with HHPT)     Equipment Recommendations   (rollator only if covered 100%)    Recommendations for Other Services       Precautions / Restrictions Precautions Precautions: Fall Precaution Comments: Home O2 3-4L Restrictions Weight Bearing Restrictions: No Other Position/Activity Restrictions: MAJED COLGROVE is an 61 y.o. male with a PMH of chronic respiratory failure on 3-4 liters of home oxygen secondary to COPD with ongoing tobacco abuse who presents 06/02/16 with  worsening dyspnea accompanied by weakness Chest x-ray  concerning for pneumonia with changes of bilateral emphysema    Mobility  Bed Mobility Overal bed mobility: Modified Independent                Transfers Overall transfer level: Needs assistance Equipment used: None Transfers: Sit to/from Stand Sit to Stand: Min guard Stand pivot transfers: Min guard       General transfer comment:  Unsteady but no overt LOB.    Ambulation/Gait Ambulation/Gait assistance: Min guard Ambulation Distance (Feet): 140 Feet (Pt required 2-3 rest breaks.  Intermittent use of railing and counters around nurses station.  ) Assistive device: None Gait Pattern/deviations: Step-through pattern;Decreased stride length;Narrow base of support;Drifts right/left Gait velocity: decreased    General Gait Details: Pt required 4L O2 during ambulation.  pt required cues for pacing, upper trunk control, reciprocal armswing and pursed lip breathing.  pt educated on continuing ambualtion at home with assist to improve strength and balance.     Stairs            Wheelchair Mobility    Modified Rankin (Stroke Patients Only)       Balance Overall balance assessment: Needs assistance;History of Falls Sitting-balance support: No upper extremity supported;Feet supported Sitting balance-Leahy Scale: Good     Standing balance support: No upper extremity supported;During functional activity Standing balance-Leahy Scale: Fair Standing balance comment: Pt did not need UE support for balance.  But pt is mildly impaired due to weakness and poor endurance.                 High Level Balance Comments: Does better with UE support with challenges.     Cognition Arousal/Alertness: Awake/alert Behavior During Therapy: WFL for tasks assessed/performed Overall Cognitive Status: Within Functional Limits for tasks assessed                 General Comments: Pt appears in better spirits.    Exercises      General Comments  Pertinent Vitals/Pain Pain Assessment: No/denies pain Pain Intervention(s): Monitored during session    Home Living Family/patient expects to be discharged to:: Private residence Living Arrangements: Non-relatives/Friends Available Help at Discharge: Personal care attendant;Available PRN/intermittently;Friend(s) (aide 2.5 hrs/7 days a week) Type of Home:  House Home Access: Stairs to enter Entrance Stairs-Rails: Right Home Layout: One level Home Equipment: Walker - standard;Cane - single point;Bedside commode;Shower seat;Hand held shower head;Wheelchair - manual;Other (comment) Additional Comments: aide 2 hours /day    Prior Function Level of Independence: Independent with assistive device(s);Needs assistance    ADL's / Homemaking Assistance Needed: aide provided assistance for ADLs and IADLs      PT Goals (current goals can now be found in the care plan section) Acute Rehab PT Goals Patient Stated Goal: to get better and go home PT Goal Formulation: With patient Potential to Achieve Goals: Good Progress towards PT goals: Progressing toward goals    Frequency    Min 3X/week      PT Plan Current plan remains appropriate    Co-evaluation             End of Session Equipment Utilized During Treatment: Oxygen;Gait belt Activity Tolerance: Patient limited by fatigue Patient left: in bed;with call bell/phone within reach;with bed alarm set     Time: 1220-1239 PT Time Calculation (min) (ACUTE ONLY): 19 min  Charges:  $Gait Training: 8-22 mins                    G Codes:      Cristela Blue 06/26/2016, 3:14 PM Governor Rooks, PTA pager [phone]

## 2016-06-23 LAB — CULTURE, BLOOD (ROUTINE X 2)
CULTURE: NO GROWTH
CULTURE: NO GROWTH

## 2016-06-29 ENCOUNTER — Inpatient Hospital Stay (HOSPITAL_COMMUNITY)
Admission: EM | Admit: 2016-06-29 | Discharge: 2016-07-03 | DRG: 193 | Disposition: A | Discharge status: Home / Self Care | Payer: Medicare Other | Attending: Family Medicine | Admitting: Family Medicine

## 2016-06-29 ENCOUNTER — Encounter (HOSPITAL_COMMUNITY): Payer: Self-pay | Admitting: Emergency Medicine

## 2016-06-29 ENCOUNTER — Emergency Department (HOSPITAL_COMMUNITY): Payer: Medicare Other

## 2016-06-29 DIAGNOSIS — Z79899 Other long term (current) drug therapy: Secondary | ICD-10-CM

## 2016-06-29 DIAGNOSIS — J9622 Acute and chronic respiratory failure with hypercapnia: Secondary | ICD-10-CM | POA: Diagnosis present

## 2016-06-29 DIAGNOSIS — J9811 Atelectasis: Secondary | ICD-10-CM

## 2016-06-29 DIAGNOSIS — R0602 Shortness of breath: Secondary | ICD-10-CM | POA: Diagnosis not present

## 2016-06-29 DIAGNOSIS — J9621 Acute and chronic respiratory failure with hypoxia: Secondary | ICD-10-CM | POA: Diagnosis present

## 2016-06-29 DIAGNOSIS — E43 Unspecified severe protein-calorie malnutrition: Secondary | ICD-10-CM | POA: Diagnosis present

## 2016-06-29 DIAGNOSIS — J189 Pneumonia, unspecified organism: Secondary | ICD-10-CM | POA: Diagnosis not present

## 2016-06-29 DIAGNOSIS — Z9081 Acquired absence of spleen: Secondary | ICD-10-CM

## 2016-06-29 DIAGNOSIS — N400 Enlarged prostate without lower urinary tract symptoms: Secondary | ICD-10-CM | POA: Diagnosis present

## 2016-06-29 DIAGNOSIS — R109 Unspecified abdominal pain: Secondary | ICD-10-CM

## 2016-06-29 DIAGNOSIS — Z7902 Long term (current) use of antithrombotics/antiplatelets: Secondary | ICD-10-CM

## 2016-06-29 DIAGNOSIS — E785 Hyperlipidemia, unspecified: Secondary | ICD-10-CM | POA: Diagnosis present

## 2016-06-29 DIAGNOSIS — J9601 Acute respiratory failure with hypoxia: Secondary | ICD-10-CM | POA: Diagnosis present

## 2016-06-29 DIAGNOSIS — Z955 Presence of coronary angioplasty implant and graft: Secondary | ICD-10-CM

## 2016-06-29 DIAGNOSIS — J440 Chronic obstructive pulmonary disease with acute lower respiratory infection: Secondary | ICD-10-CM | POA: Diagnosis present

## 2016-06-29 DIAGNOSIS — I5032 Chronic diastolic (congestive) heart failure: Secondary | ICD-10-CM | POA: Diagnosis present

## 2016-06-29 DIAGNOSIS — K5641 Fecal impaction: Secondary | ICD-10-CM | POA: Diagnosis present

## 2016-06-29 DIAGNOSIS — Z833 Family history of diabetes mellitus: Secondary | ICD-10-CM

## 2016-06-29 DIAGNOSIS — F411 Generalized anxiety disorder: Secondary | ICD-10-CM | POA: Diagnosis present

## 2016-06-29 DIAGNOSIS — I2510 Atherosclerotic heart disease of native coronary artery without angina pectoris: Secondary | ICD-10-CM | POA: Diagnosis present

## 2016-06-29 DIAGNOSIS — Z7951 Long term (current) use of inhaled steroids: Secondary | ICD-10-CM

## 2016-06-29 DIAGNOSIS — I252 Old myocardial infarction: Secondary | ICD-10-CM

## 2016-06-29 DIAGNOSIS — Z8571 Personal history of Hodgkin lymphoma: Secondary | ICD-10-CM

## 2016-06-29 DIAGNOSIS — Z823 Family history of stroke: Secondary | ICD-10-CM

## 2016-06-29 DIAGNOSIS — Z681 Body mass index (BMI) 19 or less, adult: Secondary | ICD-10-CM

## 2016-06-29 DIAGNOSIS — D649 Anemia, unspecified: Secondary | ICD-10-CM | POA: Diagnosis present

## 2016-06-29 DIAGNOSIS — F17210 Nicotine dependence, cigarettes, uncomplicated: Secondary | ICD-10-CM | POA: Diagnosis present

## 2016-06-29 DIAGNOSIS — J441 Chronic obstructive pulmonary disease with (acute) exacerbation: Secondary | ICD-10-CM | POA: Diagnosis present

## 2016-06-29 MED ORDER — ALBUTEROL (5 MG/ML) CONTINUOUS INHALATION SOLN
15.0000 mg | INHALATION_SOLUTION | RESPIRATORY_TRACT | Status: DC
  Administered 2016-06-29: 15 mg via RESPIRATORY_TRACT

## 2016-06-29 MED ORDER — ALBUTEROL (5 MG/ML) CONTINUOUS INHALATION SOLN
INHALATION_SOLUTION | RESPIRATORY_TRACT | Status: AC
  Administered 2016-06-29: 15 mg via RESPIRATORY_TRACT
  Filled 2016-06-29: qty 20

## 2016-06-29 NOTE — ED Provider Notes (Signed)
Philip Coleman Provider Note   CSN: [id number] Arrival date & time: 06/29/16  2323  By signing my name below, I, Philip Coleman, attest that this documentation has been prepared under the direction and in the presence of Philip Osborn, MD. Electronically Signed: Margit Coleman, ED Scribe. 06/30/16. 12:15 AM.  History   Chief Complaint Chief Complaint  Patient presents with  . Respiratory Distress   LEVEL V CAVEAT: HPI and ROS limited due to acuity of condition.  HPI Philip Coleman is a 61 y.o. male via EMS, who has a PMHx of COPD, CHF, CAD, and prior NSTEMI, presents to the Emergency Department c/o constant shortness of breath beginning three hours ago. Per EMS, pt was at home tonight when he tried getting up from his toilet, and following taking "10 steps" he began to wheeze and his SOB has been present since. Upon EMS arrival, they noted that his home oxygen line had a hole in it and was not working properly. Pt is currently on 4L continuous oxygen at home. EMS administered 10mg  Albuterol, 1mg  Atrovent, 125mg  Solumedrol, and 2g Magnesium en route with noted relief of his SOB. They also placed him on a CPAP en route without noted relief as well. Per prior chart review, pt was last seen and admitted to the hospital for this issue on 06/17/16 and d/c'd on 06/21/16. At the time his sx were attributed to COPD exacerbation and PNA.   The history is provided by the EMS personnel and medical records. The history is limited by the condition of the patient. No language interpreter was used.  Shortness of Breath  This is a recurrent problem. The problem occurs continuously.The current episode started more than 2 days ago. The problem has been rapidly worsening. Associated symptoms include cough and wheezing. Pertinent negatives include no fever. Treatments tried: oxygen. The treatment provided no relief. He has had prior hospitalizations. He has had prior ED visits. Associated medical issues include  COPD.    Past Medical History:  Diagnosis Date  . Anxiety state, unspecified   . Chronic airway obstruction, not elsewhere classified   . Chronic respiratory failure ([place])   . COPD (chronic obstructive pulmonary disease) ([place])   . Cough   . Depressive disorder, not elsewhere classified   . Diarrhea   . Headache(784.0)   . Hodgkin's disease 1993   with abdominal surgical scar -pt unable to describe   . Lumbago   . NSTEMI (non-ST elevated myocardial infarction) ([place])   . Other dyspnea and respiratory abnormality   . Other malaise and fatigue   . Pneumonia   . Spontaneous pneumothorax    left  . Spontaneous pneumothorax    right  . Thyroid disease    hyper, not on meds  . Unspecified constipation   . Weight loss     Patient Active Problem List   Diagnosis Date Noted  . HCAP (healthcare-associated pneumonia) 06/17/2016  . CAP (community acquired pneumonia) 06/02/2016  . Anxiety disorder 06/02/2016  . BPH (benign prostatic hyperplasia) 06/02/2016  . Orthostatic hypotension 03/06/2014  . NSTEMI (non-ST elevated myocardial infarction) ([place])   . Systolic CHF, acute ([place]) 11/21/2013  . Erectile dysfunction 09/11/2013  . Esophageal reflux 07/03/2013  . Hyperlipidemia 06/12/2013  . Heartburn 05/23/2013  . Pain in joint, shoulder region 02/13/2013  . CAD in native artery 09/24/2012  . COPD exacerbation ([place]) 04/01/2012  . Acute on chronic respiratory failure ([place]) 04/01/2012  . Protein-calorie malnutrition, severe ([place]) 01/23/2012  . Tobacco abuse 12/24/2011  .  Hyponatremia 09/20/2011  . COPD with emphysema ([place]) 09/16/2011  . ETOH abuse 09/16/2011    Past Surgical History:  Procedure Laterality Date  . CHEST TUBE INSERTION     left  . CHEST TUBE INSERTION     right  . LEFT HEART CATHETERIZATION WITH CORONARY ANGIOGRAM N/A 11/21/2013   Procedure: LEFT HEART CATHETERIZATION WITH CORONARY ANGIOGRAM;  Surgeon: Peter M Martinique, MD;  Location: [facility] CATH LAB;  Service:  Cardiovascular;  Laterality: N/A;  . PERCUTANEOUS CORONARY INTERVENTION-BALLOON ONLY  11/22/2013   Procedure: PERCUTANEOUS CORONARY INTERVENTION-BALLOON ONLY;  Surgeon: Jettie Booze, MD;  Location: [facility] CATH LAB;  Service: Cardiovascular;;  CFX  . PERCUTANEOUS CORONARY STENT INTERVENTION (PCI-S) N/A 11/22/2013   Procedure: PERCUTANEOUS CORONARY STENT INTERVENTION (PCI-S);  Surgeon: Jettie Booze, MD;  Location: [facility] CATH LAB;  Service: Cardiovascular;  Laterality: N/A;  LAD  . SPLENECTOMY  1987  . teeth extracted for dentures  2012       Home Medications    Prior to Admission medications   Medication Sig Start Date End Date Taking? Authorizing Provider  albuterol (PROVENTIL HFA;VENTOLIN HFA) 108 (90 Base) MCG/ACT inhaler Inhale 2 puffs into the lungs every 6 (six) hours as needed for wheezing or shortness of breath.    Historical Provider, MD  albuterol (PROVENTIL) (2.5 MG/3ML) 0.083% nebulizer solution Take 2.5 mg by nebulization every 6 (six) hours as needed for wheezing or shortness of breath.    Historical Provider, MD  atorvastatin (LIPITOR) 40 MG tablet Take 40 mg by mouth every evening.    Historical Provider, MD  budesonide-formoterol (SYMBICORT) 160-4.5 MCG/ACT inhaler Inhale 2 puffs into the lungs 2 (two) times daily.    Historical Provider, MD  clonazePAM (KLONOPIN) 0.5 MG tablet Take 0.5 mg by mouth 2 (two) times daily as needed for anxiety.    Historical Provider, MD  clopidogrel (PLAVIX) 75 MG tablet Take 75 mg by mouth daily.    Historical Provider, MD  feeding supplement, ENSURE ENLIVE, (ENSURE ENLIVE) LIQD Take 237 mLs by mouth 3 (three) times daily between meals. 06/05/16   Mariel Aloe, MD  Ipratropium-Albuterol (COMBIVENT RESPIMAT) 20-100 MCG/ACT AERS respimat Inhale 1 puff into the lungs every 6 (six) hours as needed for wheezing or shortness of breath.    Historical Provider, MD  isosorbide mononitrate (IMDUR) 30 MG 24 hr tablet Take 15 mg by mouth daily.     Historical Provider, MD  levofloxacin (LEVAQUIN) 500 MG tablet Take 1 tablet (500 mg total) by mouth daily. For 4days 06/21/16   Domenic Polite, MD  nitroGLYCERIN (NITROSTAT) 0.4 MG SL tablet Place 1 tablet (0.4 mg total) under the tongue every 5 (five) minutes as needed for chest pain. 01/25/16   Jettie Booze, MD  pantoprazole (PROTONIX) 40 MG tablet Take 1 tablet (40 mg total) by mouth daily. 03/06/14   Jettie Booze, MD  predniSONE (DELTASONE) 20 MG tablet Take 1-2 tablets (20-40 mg total) by mouth daily with breakfast. Take 40mg  for 2days then 20mg  for 2days then STOP 06/21/16   Domenic Polite, MD  sertraline (ZOLOFT) 100 MG tablet Take 100 mg by mouth daily.    Historical Provider, MD  tamsulosin (FLOMAX) 0.4 MG CAPS capsule Take 0.4 mg by mouth daily after supper.     Historical Provider, MD  traMADol (ULTRAM) 50 MG tablet Take 100 mg by mouth every 12 (twelve) hours as needed for moderate pain.    Historical Provider, MD    Family History Family History  Problem Relation Age of Onset  . Cancer Mother 15  . Hyperlipidemia Mother   . Diabetes Mother   . Pneumonia Father   . Cancer Sister   . Stroke Sister   . Heart attack Neg Hx     Social History Social History  Substance Use Topics  . Smoking status: Current Every Day Smoker    Packs/day: 0.25    Years: 41.00    Types: Cigarettes  . Smokeless tobacco: Current User     Comment: nothinhg will work. Tried everything 09/24/12--5 cigs per day  . Alcohol use No     Comment: socially     Allergies   Codeine   Review of Systems Review of Systems  Unable to perform ROS: Acuity of condition  Constitutional: Negative for fever.  Respiratory: Positive for cough, shortness of breath and wheezing.      Physical Exam Updated Vital Signs BP 143/76   Pulse 104   Temp 99.2 F (37.3 C) (Oral)   Resp 18   Ht 5\' 7"  (1.702 m)   Wt 99 lb (44.9 kg)   SpO2 94%   BMI 15.51 kg/m   Physical Exam  Constitutional: He  appears well-developed and well-nourished.  HENT:  Head: Normocephalic and atraumatic.  Mouth/Throat: Oropharynx is clear and moist. No oropharyngeal exudate.  Eyes: Conjunctivae and EOM are normal. Pupils are equal, round, and reactive to light. Right eye exhibits no discharge. Left eye exhibits no discharge. No scleral icterus.  Neck: Normal range of motion. Neck supple. No JVD present. No tracheal deviation present.  Trachea is midline. No stridor or carotid bruits.  Cardiovascular: Normal rate, regular rhythm, normal heart sounds and intact distal pulses.   No murmur heard. Pulmonary/Chest: Accessory muscle usage present. No stridor. He is in respiratory distress. He has decreased breath sounds. He has no wheezes. He has no rales.  Sternocleidomastoid accessory muscle usage is noted. Decreased breath sounds bilaterally.   Abdominal: Soft. Bowel sounds are normal. He exhibits no distension and no mass. There is no tenderness. There is no rebound and no guarding.  Musculoskeletal: Normal range of motion. He exhibits no edema or tenderness.  All compartments are soft. No palpable cords. There are several abrasions at the nail beds of the bilateral feet.   Lymphadenopathy:    He has no cervical adenopathy.  Neurological: He is alert. He has normal reflexes. He displays normal reflexes.  Skin: Skin is warm and dry. Capillary refill takes less than 2 seconds. He is not diaphoretic.  Psychiatric: He has a normal mood and affect. His behavior is normal.  Nursing note and vitals reviewed.   ED Treatments / Results   Vitals:   06/29/16 2347 06/30/16 0052  BP: 143/76 161/83  Pulse: 104 106  Resp: 18 25  Temp:      DIAGNOSTIC STUDIES: Oxygen Saturation is 94% on 4L [place], low by my interpretation.    Results for orders placed or performed during the hospital encounter of 06/29/16  CBC with Differential/Platelet  Result Value Ref Range   WBC 17.5 (H) 4.0 - 10.5 K/uL   RBC 4.28 4.22 -  5.81 MIL/uL   Hemoglobin 11.2 (L) 13.0 - 17.0 g/dL   HCT 36.9 (L) 39.0 - 52.0 %   MCV 86.2 78.0 - 100.0 fL   MCH 26.2 26.0 - 34.0 pg   MCHC 30.4 30.0 - 36.0 g/dL   RDW 15.6 (H) 11.5 - 15.5 %   Platelets 400 150 - 400 K/uL  Neutrophils Relative % PENDING %   Neutro Abs PENDING 1.7 - 7.7 K/uL   Band Neutrophils PENDING %   Lymphocytes Relative PENDING %   Lymphs Abs PENDING 0.7 - 4.0 K/uL   Monocytes Relative PENDING %   Monocytes Absolute PENDING 0.1 - 1.0 K/uL   Eosinophils Relative PENDING %   Eosinophils Absolute PENDING 0.0 - 0.7 K/uL   Basophils Relative PENDING %   Basophils Absolute PENDING 0.0 - 0.1 K/uL   WBC Morphology PENDING    RBC Morphology PENDING    Smear Review PENDING    nRBC PENDING 0 /100 WBC   Metamyelocytes Relative PENDING %   Myelocytes PENDING %   Promyelocytes Absolute PENDING %   Blasts PENDING %  I-Stat Chem 8, ED  Result Value Ref Range   Sodium 141 135 - 145 mmol/L   Potassium 4.0 3.5 - 5.1 mmol/L   Chloride 95 (L) 101 - 111 mmol/L   BUN 7 6 - 20 mg/dL   Creatinine, Ser 0.80 0.61 - 1.24 mg/dL   Glucose, Bld 121 (H) 65 - 99 mg/dL   Calcium, Ion 1.11 (L) 1.15 - 1.40 mmol/L   TCO2 41 0 - 100 mmol/L   Hemoglobin 12.2 (L) 13.0 - 17.0 g/dL   HCT 36.0 (L) 39.0 - 52.0 %  I-stat troponin, ED  Result Value Ref Range   Troponin i, poc 0.01 0.00 - 0.08 ng/mL   Comment 3           Dg Chest 2 View  Result Date: 06/17/2016 CLINICAL DATA:  60 y/o M; worsening shortness of breath. History of COPD. EXAM: CHEST  2 VIEW COMPARISON:  06/01/2016 chest radiograph FINDINGS: Stable cardiac silhouette within normal limits. Aortic atherosclerosis with calcification. Emphysema with bullous changes greatest in the left apex and right lung base. Chronic blunting of costal diaphragmatic angles. Left lung base opacity may represent atelectasis or pneumonia. No pneumothorax. IMPRESSION: Left lung base opacity may represent atelectasis or pneumonia. Aortic atherosclerosis.  Stable cardiac silhouette. Severe emphysema. Electronically Signed   By: Kristine Garbe M.D.   On: 06/17/2016 06:13   Ct Head Wo Contrast  Result Date: 06/04/2016 CLINICAL DATA:  Headache EXAM: CT HEAD WITHOUT CONTRAST TECHNIQUE: Contiguous axial images were obtained from the base of the skull through the vertex without intravenous contrast. COMPARISON:  None. FINDINGS: Brain: Patchy chronic microvascular disease throughout the deep white matter. No acute intracranial abnormality. Specifically, no hemorrhage, hydrocephalus, mass lesion, acute infarction, or significant intracranial injury. Vascular: No hyperdense vessel or unexpected calcification. Skull: No acute calvarial abnormality. Sinuses/Orbits: Visualized paranasal sinuses and mastoids clear. Orbital soft tissues unremarkable. Other: None IMPRESSION: Patchy chronic microvascular disease. No acute intracranial abnormality. Electronically Signed   By: Rolm Baptise M.D.   On: 06/04/2016 11:35   Dg Chest Portable 1 View  Result Date: 06/30/2016 CLINICAL DATA:  Acute onset of shortness of breath. Tachycardia. Initial encounter. EXAM: PORTABLE CHEST 1 VIEW COMPARISON:  Chest radiograph performed 06/17/2016 FINDINGS: The lungs are well-aerated. Mild right midlung and right basilar airspace opacities raise concern for pneumonia. Peribronchial thickening is noted. There is no evidence of pleural effusion or pneumothorax. The cardiomediastinal silhouette is within normal limits. No acute osseous abnormalities are seen. Postoperative change is noted overlying the left quadrant. IMPRESSION: Mild right midlung and right basilar airspace opacities raise concern for pneumonia. Peribronchial thickening noted. Electronically Signed   By: Garald Balding M.D.   On: 06/30/2016 00:05   Dg Chest Portable 1 View  Result Date: 06/02/2016 CLINICAL DATA:  Acute onset of shortness of breath and wheezing. Initial encounter. EXAM: PORTABLE CHEST 1 VIEW COMPARISON:   Chest radiograph performed 11/26/2013 FINDINGS: The lungs are well-aerated. Vascular congestion is noted. Left midlung opacity may reflect mild pneumonia. Underlying emphysema is noted bilaterally. Chronic bibasilar pleural thickening is noted. There is no evidence of significant pleural effusion or pneumothorax. The cardiomediastinal silhouette is within normal limits. No acute osseous abnormalities are seen. IMPRESSION: Vascular congestion noted. Left midlung opacity may reflect mild pneumonia, depending on the patient's symptoms. Underlying bilateral emphysema noted. Electronically Signed   By: Garald Balding M.D.   On: 06/02/2016 00:39     EKG Interpretation  Date/Time:  Wednesday June 29 2016 23:53:08 EST Ventricular Rate:  98 PR Interval:    QRS Duration: 90 QT Interval:  385 QTC Calculation: 492 R Axis:   82 Text Interpretation:  Sinus rhythm Short PR interval Confirmed by [facility]  MD, Atzel Mccambridge ([id number]) on 06/29/2016 11:58:53 PM      MDM Number of Diagnoses or Management Options HCAP (healthcare-associated pneumonia):  Critical Care Total time providing critical care: 75-105 minutes MDM Reviewed: previous chart, nursing note and vitals Reviewed previous: labs and ECG Interpretation: ECG, labs and x-ray (elevated white count, negative troponin.  ) Total time providing critical care: 75-105 minutes. This excludes time spent performing separately reportable procedures and services. Consults: admitting MD   Procedures Procedures   CRITICAL CARE Performed by: Nina Mondor, MD Total critical care time: 76 minutes Critical care time was exclusive of separately billable procedures and treating other patients. Critical care was necessary to treat or prevent imminent or life-threatening deterioration. Critical care was time spent personally by me on the following activities: development of treatment plan with patient and/or surrogate as well as nursing, discussions with  consultants, evaluation of patient's response to treatment, examination of patient, obtaining history from patient or surrogate, ordering and performing treatments and interventions, ordering and review of laboratory studies, ordering and review of radiographic studies, pulse oximetry and re-evaluation of patient's condition.  Medications Ordered in ED Medications  albuterol (PROVENTIL,VENTOLIN) solution continuous neb (15 mg Nebulization New Bag/Given 06/29/16 2350)     Final Clinical Impressions(s) / ED Diagnoses  HCAP: will require inpatient admission with IV antibiotics New Prescriptions New Prescriptions   No medications on file   I personally performed the services described in this documentation, which was scribed in my presence. The recorded information has been reviewed and is accurate.       Veatrice Kells, MD 06/30/16 0110

## 2016-06-29 NOTE — ED Triage Notes (Signed)
Patient arrives by EMS. 4 day hx constipation with 3 hour hx SOB. Seen here 2 days ago for SOB. COPD-patient arrives on bi-pap. EMS found patient on toliet in bathroom-EMS called by wife. tachypneic and diaphoretic. Albuterol 10 mg/atrovent 1 mg, Solumedrol 125 mg, Mag 2 grams IV-after Mag-placed on bipap.

## 2016-06-30 ENCOUNTER — Encounter (HOSPITAL_COMMUNITY): Payer: Self-pay

## 2016-06-30 DIAGNOSIS — Z955 Presence of coronary angioplasty implant and graft: Secondary | ICD-10-CM | POA: Diagnosis not present

## 2016-06-30 DIAGNOSIS — I2510 Atherosclerotic heart disease of native coronary artery without angina pectoris: Secondary | ICD-10-CM | POA: Diagnosis not present

## 2016-06-30 DIAGNOSIS — Z833 Family history of diabetes mellitus: Secondary | ICD-10-CM | POA: Diagnosis not present

## 2016-06-30 DIAGNOSIS — Z681 Body mass index (BMI) 19 or less, adult: Secondary | ICD-10-CM | POA: Diagnosis not present

## 2016-06-30 DIAGNOSIS — N400 Enlarged prostate without lower urinary tract symptoms: Secondary | ICD-10-CM | POA: Diagnosis present

## 2016-06-30 DIAGNOSIS — Z8571 Personal history of Hodgkin lymphoma: Secondary | ICD-10-CM | POA: Diagnosis not present

## 2016-06-30 DIAGNOSIS — R0602 Shortness of breath: Secondary | ICD-10-CM | POA: Diagnosis present

## 2016-06-30 DIAGNOSIS — J9622 Acute and chronic respiratory failure with hypercapnia: Secondary | ICD-10-CM | POA: Diagnosis present

## 2016-06-30 DIAGNOSIS — E43 Unspecified severe protein-calorie malnutrition: Secondary | ICD-10-CM | POA: Diagnosis present

## 2016-06-30 DIAGNOSIS — J9601 Acute respiratory failure with hypoxia: Secondary | ICD-10-CM | POA: Diagnosis present

## 2016-06-30 DIAGNOSIS — Z7951 Long term (current) use of inhaled steroids: Secondary | ICD-10-CM | POA: Diagnosis not present

## 2016-06-30 DIAGNOSIS — F411 Generalized anxiety disorder: Secondary | ICD-10-CM | POA: Diagnosis present

## 2016-06-30 DIAGNOSIS — Z823 Family history of stroke: Secondary | ICD-10-CM | POA: Diagnosis not present

## 2016-06-30 DIAGNOSIS — K5900 Constipation, unspecified: Secondary | ICD-10-CM | POA: Diagnosis not present

## 2016-06-30 DIAGNOSIS — J9621 Acute and chronic respiratory failure with hypoxia: Secondary | ICD-10-CM | POA: Diagnosis present

## 2016-06-30 DIAGNOSIS — J189 Pneumonia, unspecified organism: Secondary | ICD-10-CM | POA: Diagnosis present

## 2016-06-30 DIAGNOSIS — D649 Anemia, unspecified: Secondary | ICD-10-CM | POA: Diagnosis present

## 2016-06-30 DIAGNOSIS — J441 Chronic obstructive pulmonary disease with (acute) exacerbation: Secondary | ICD-10-CM | POA: Diagnosis present

## 2016-06-30 DIAGNOSIS — I252 Old myocardial infarction: Secondary | ICD-10-CM | POA: Diagnosis not present

## 2016-06-30 DIAGNOSIS — Z9081 Acquired absence of spleen: Secondary | ICD-10-CM | POA: Diagnosis not present

## 2016-06-30 DIAGNOSIS — E785 Hyperlipidemia, unspecified: Secondary | ICD-10-CM | POA: Diagnosis present

## 2016-06-30 DIAGNOSIS — Z7902 Long term (current) use of antithrombotics/antiplatelets: Secondary | ICD-10-CM | POA: Diagnosis not present

## 2016-06-30 DIAGNOSIS — K5641 Fecal impaction: Secondary | ICD-10-CM | POA: Diagnosis present

## 2016-06-30 DIAGNOSIS — Z79899 Other long term (current) drug therapy: Secondary | ICD-10-CM | POA: Diagnosis not present

## 2016-06-30 DIAGNOSIS — F17210 Nicotine dependence, cigarettes, uncomplicated: Secondary | ICD-10-CM | POA: Diagnosis present

## 2016-06-30 DIAGNOSIS — I248 Other forms of acute ischemic heart disease: Secondary | ICD-10-CM | POA: Diagnosis not present

## 2016-06-30 DIAGNOSIS — I5032 Chronic diastolic (congestive) heart failure: Secondary | ICD-10-CM | POA: Diagnosis present

## 2016-06-30 DIAGNOSIS — J440 Chronic obstructive pulmonary disease with acute lower respiratory infection: Secondary | ICD-10-CM | POA: Diagnosis present

## 2016-06-30 DIAGNOSIS — I509 Heart failure, unspecified: Secondary | ICD-10-CM | POA: Diagnosis not present

## 2016-06-30 LAB — CBC WITH DIFFERENTIAL/PLATELET
BASOS ABS: 0 10*3/uL (ref 0.0–0.1)
Basophils Relative: 0 %
EOS ABS: 0.5 10*3/uL (ref 0.0–0.7)
Eosinophils Relative: 3 %
HCT: 36.9 % — ABNORMAL LOW (ref 39.0–52.0)
HEMOGLOBIN: 11.2 g/dL — AB (ref 13.0–17.0)
LYMPHS PCT: 28 %
Lymphs Abs: 4.9 10*3/uL — ABNORMAL HIGH (ref 0.7–4.0)
MCH: 26.2 pg (ref 26.0–34.0)
MCHC: 30.4 g/dL (ref 30.0–36.0)
MCV: 86.2 fL (ref 78.0–100.0)
Monocytes Absolute: 1.6 10*3/uL — ABNORMAL HIGH (ref 0.1–1.0)
Monocytes Relative: 9 %
NEUTROS PCT: 60 %
Neutro Abs: 10.5 10*3/uL — ABNORMAL HIGH (ref 1.7–7.7)
Platelets: 400 10*3/uL (ref 150–400)
RBC: 4.28 MIL/uL (ref 4.22–5.81)
RDW: 15.6 % — ABNORMAL HIGH (ref 11.5–15.5)
WBC: 17.5 10*3/uL — ABNORMAL HIGH (ref 4.0–10.5)

## 2016-06-30 LAB — I-STAT CHEM 8, ED
BUN: 7 mg/dL (ref 6–20)
CHLORIDE: 95 mmol/L — AB (ref 101–111)
Calcium, Ion: 1.11 mmol/L — ABNORMAL LOW (ref 1.15–1.40)
Creatinine, Ser: 0.8 mg/dL (ref 0.61–1.24)
Glucose, Bld: 121 mg/dL — ABNORMAL HIGH (ref 65–99)
HEMATOCRIT: 36 % — AB (ref 39.0–52.0)
Hemoglobin: 12.2 g/dL — ABNORMAL LOW (ref 13.0–17.0)
POTASSIUM: 4 mmol/L (ref 3.5–5.1)
Sodium: 141 mmol/L (ref 135–145)
TCO2: 41 mmol/L (ref 0–100)

## 2016-06-30 LAB — BLOOD GAS, ARTERIAL
Acid-Base Excess: 11.6 mmol/L — ABNORMAL HIGH (ref 0.0–2.0)
Bicarbonate: 38.6 mmol/L — ABNORMAL HIGH (ref 20.0–28.0)
DRAWN BY: 232811
Delivery systems: POSITIVE
Expiratory PAP: 5
FIO2: 40
Inspiratory PAP: 10
Mode: POSITIVE
O2 Saturation: 96.9 %
PATIENT TEMPERATURE: 98.7
pCO2 arterial: 65.7 mmHg (ref 32.0–48.0)
pH, Arterial: 7.386 (ref 7.350–7.450)
pO2, Arterial: 89.6 mmHg (ref 83.0–108.0)

## 2016-06-30 LAB — BASIC METABOLIC PANEL
ANION GAP: 9 (ref 5–15)
BUN: 9 mg/dL (ref 6–20)
CO2: 36 mmol/L — AB (ref 22–32)
Calcium: 9.2 mg/dL (ref 8.9–10.3)
Chloride: 95 mmol/L — ABNORMAL LOW (ref 101–111)
Creatinine, Ser: 0.93 mg/dL (ref 0.61–1.24)
GFR calc Af Amer: >60 mL/min (ref >60)
GFR calc non Af Amer: >60 mL/min (ref >60)
GLUCOSE: 199 mg/dL — AB (ref 65–99)
POTASSIUM: 4.5 mmol/L (ref 3.5–5.1)
Sodium: 140 mmol/L (ref 135–145)

## 2016-06-30 LAB — CBC
HEMATOCRIT: 36.3 % — AB (ref 39.0–52.0)
HEMOGLOBIN: 11.1 g/dL — AB (ref 13.0–17.0)
MCH: 26.9 pg (ref 26.0–34.0)
MCHC: 30.6 g/dL (ref 30.0–36.0)
MCV: 87.9 fL (ref 78.0–100.0)
Platelets: 394 10*3/uL (ref 150–400)
RBC: 4.13 MIL/uL — ABNORMAL LOW (ref 4.22–5.81)
RDW: 16 % — AB (ref 11.5–15.5)
WBC: 19.2 10*3/uL — ABNORMAL HIGH (ref 4.0–10.5)

## 2016-06-30 LAB — BRAIN NATRIURETIC PEPTIDE: B Natriuretic Peptide: 171.3 pg/mL — ABNORMAL HIGH (ref 0.0–100.0)

## 2016-06-30 LAB — I-STAT TROPONIN, ED: Troponin i, poc: 0.01 ng/mL (ref 0.00–0.08)

## 2016-06-30 LAB — MRSA PCR SCREENING: MRSA by PCR: NEGATIVE

## 2016-06-30 LAB — TROPONIN I
Troponin I: 0.13 ng/mL (ref <0.03)
Troponin I: 0.4 ng/mL (ref <0.03)

## 2016-06-30 MED ORDER — ENOXAPARIN SODIUM 30 MG/0.3ML ~~LOC~~ SOLN
30.0000 mg | SUBCUTANEOUS | Status: DC
  Administered 2016-06-30 – 2016-07-02 (×3): 30 mg via SUBCUTANEOUS
  Filled 2016-06-30 (×4): qty 0.3

## 2016-06-30 MED ORDER — SERTRALINE HCL 100 MG PO TABS
100.0000 mg | ORAL_TABLET | Freq: Every day | ORAL | Status: DC
  Administered 2016-06-30 – 2016-07-03 (×4): 100 mg via ORAL
  Filled 2016-06-30: qty 1
  Filled 2016-06-30 (×2): qty 2
  Filled 2016-06-30: qty 1

## 2016-06-30 MED ORDER — VANCOMYCIN HCL IN DEXTROSE 1-5 GM/200ML-% IV SOLN
1000.0000 mg | INTRAVENOUS | Status: DC
  Administered 2016-06-30 – 2016-07-01 (×2): 1000 mg via INTRAVENOUS
  Filled 2016-06-30 (×2): qty 200

## 2016-06-30 MED ORDER — HYDRALAZINE HCL 20 MG/ML IJ SOLN
10.0000 mg | INTRAMUSCULAR | Status: DC | PRN
  Administered 2016-06-30: 10 mg via INTRAVENOUS
  Filled 2016-06-30: qty 1

## 2016-06-30 MED ORDER — MINERAL OIL RE ENEM
1.0000 | ENEMA | Freq: Once | RECTAL | Status: AC
Start: 2016-06-30 — End: 2016-06-30
  Administered 2016-06-30: 1 via RECTAL
  Filled 2016-06-30: qty 1

## 2016-06-30 MED ORDER — ACETAMINOPHEN 325 MG PO TABS
650.0000 mg | ORAL_TABLET | Freq: Four times a day (QID) | ORAL | Status: DC | PRN
  Administered 2016-06-30: 650 mg via ORAL
  Filled 2016-06-30: qty 2

## 2016-06-30 MED ORDER — ONDANSETRON HCL 4 MG PO TABS: 4.0000 mg | ORAL_TABLET | Freq: Four times a day (QID) | ORAL | Status: DC | PRN

## 2016-06-30 MED ORDER — ONDANSETRON HCL 4 MG/2ML IJ SOLN: 4.0000 mg | Freq: Four times a day (QID) | INTRAMUSCULAR | Status: DC | PRN

## 2016-06-30 MED ORDER — ISOSORBIDE MONONITRATE ER 30 MG PO TB24
15.0000 mg | ORAL_TABLET | Freq: Every day | ORAL | Status: DC
  Administered 2016-06-30 – 2016-07-03 (×4): 15 mg via ORAL
  Filled 2016-06-30 (×4): qty 1

## 2016-06-30 MED ORDER — BUDESONIDE 0.25 MG/2ML IN SUSP
0.2500 mg | Freq: Two times a day (BID) | RESPIRATORY_TRACT | Status: DC
  Administered 2016-06-30 – 2016-07-03 (×7): 0.25 mg via RESPIRATORY_TRACT
  Filled 2016-06-30 (×7): qty 2

## 2016-06-30 MED ORDER — ATORVASTATIN CALCIUM 40 MG PO TABS
40.0000 mg | ORAL_TABLET | Freq: Every evening | ORAL | Status: DC
  Administered 2016-06-30 – 2016-07-03 (×4): 40 mg via ORAL
  Filled 2016-06-30 (×4): qty 1

## 2016-06-30 MED ORDER — TRAMADOL HCL 50 MG PO TABS
100.0000 mg | ORAL_TABLET | Freq: Two times a day (BID) | ORAL | Status: DC | PRN
  Administered 2016-06-30 – 2016-07-02 (×4): 100 mg via ORAL
  Filled 2016-06-30 (×5): qty 2

## 2016-06-30 MED ORDER — PIPERACILLIN-TAZOBACTAM 3.375 G IVPB 30 MIN
3.3750 g | Freq: Once | INTRAVENOUS | Status: AC
  Administered 2016-06-30: 3.375 g via INTRAVENOUS
  Filled 2016-06-30: qty 50

## 2016-06-30 MED ORDER — VANCOMYCIN HCL IN DEXTROSE 1-5 GM/200ML-% IV SOLN
1000.0000 mg | Freq: Once | INTRAVENOUS | Status: AC
  Administered 2016-06-30: 1000 mg via INTRAVENOUS
  Filled 2016-06-30: qty 200

## 2016-06-30 MED ORDER — IPRATROPIUM-ALBUTEROL 0.5-2.5 (3) MG/3ML IN SOLN
3.0000 mL | Freq: Four times a day (QID) | RESPIRATORY_TRACT | Status: DC
  Administered 2016-06-30 – 2016-07-01 (×4): 3 mL via RESPIRATORY_TRACT
  Filled 2016-06-30 (×5): qty 3

## 2016-06-30 MED ORDER — MORPHINE SULFATE (PF) 4 MG/ML IV SOLN
1.0000 mg | Freq: Once | INTRAVENOUS | Status: AC
  Administered 2016-06-30: 1 mg via INTRAVENOUS
  Filled 2016-06-30: qty 1

## 2016-06-30 MED ORDER — LORAZEPAM 1 MG PO TABS
1.0000 mg | ORAL_TABLET | Freq: Once | ORAL | Status: AC
  Administered 2016-06-30: 1 mg via ORAL
  Filled 2016-06-30: qty 1

## 2016-06-30 MED ORDER — ENSURE ENLIVE PO LIQD
237.0000 mL | Freq: Three times a day (TID) | ORAL | Status: DC
  Administered 2016-06-30 – 2016-07-03 (×8): 237 mL via ORAL

## 2016-06-30 MED ORDER — TAMSULOSIN HCL 0.4 MG PO CAPS
0.4000 mg | ORAL_CAPSULE | Freq: Every day | ORAL | Status: DC
  Administered 2016-06-30 – 2016-07-03 (×4): 0.4 mg via ORAL
  Filled 2016-06-30 (×4): qty 1

## 2016-06-30 MED ORDER — IPRATROPIUM-ALBUTEROL 0.5-2.5 (3) MG/3ML IN SOLN: 3.0000 mL | RESPIRATORY_TRACT | Status: DC

## 2016-06-30 MED ORDER — BISACODYL 10 MG RE SUPP
10.0000 mg | Freq: Once | RECTAL | Status: AC
  Administered 2016-07-01: 10 mg via RECTAL
  Filled 2016-06-30: qty 1

## 2016-06-30 MED ORDER — CEFEPIME HCL 1 G IJ SOLR
1.0000 g | Freq: Three times a day (TID) | INTRAMUSCULAR | Status: DC
Start: 2016-06-30 — End: 2016-07-02
  Administered 2016-06-30 – 2016-07-02 (×7): 1 g via INTRAVENOUS
  Filled 2016-06-30 (×9): qty 1

## 2016-06-30 MED ORDER — POLYETHYLENE GLYCOL 3350 17 G PO PACK
17.0000 g | PACK | ORAL | Status: DC
  Administered 2016-06-30 (×3): 17 g via ORAL
  Filled 2016-06-30 (×4): qty 1

## 2016-06-30 MED ORDER — CLONAZEPAM 0.5 MG PO TABS
0.5000 mg | ORAL_TABLET | Freq: Two times a day (BID) | ORAL | Status: DC | PRN
Start: 2016-06-30 — End: 2016-07-03
  Administered 2016-06-30 – 2016-07-03 (×7): 0.5 mg via ORAL
  Filled 2016-06-30 (×8): qty 1

## 2016-06-30 MED ORDER — IPRATROPIUM-ALBUTEROL 0.5-2.5 (3) MG/3ML IN SOLN: 3.0000 mL | RESPIRATORY_TRACT | Status: DC | PRN

## 2016-06-30 MED ORDER — TAB-A-VITE/IRON PO TABS
1.0000 | ORAL_TABLET | Freq: Every day | ORAL | Status: DC
  Administered 2016-06-30 – 2016-07-03 (×5): 1 via ORAL
  Filled 2016-06-30 (×5): qty 1

## 2016-06-30 MED ORDER — CLOPIDOGREL BISULFATE 75 MG PO TABS
75.0000 mg | ORAL_TABLET | Freq: Every day | ORAL | Status: DC
  Administered 2016-06-30 – 2016-07-03 (×4): 75 mg via ORAL
  Filled 2016-06-30 (×4): qty 1

## 2016-06-30 MED ORDER — METHYLPREDNISOLONE SODIUM SUCC 40 MG IJ SOLR
40.0000 mg | Freq: Two times a day (BID) | INTRAMUSCULAR | Status: DC
  Administered 2016-06-30 – 2016-07-03 (×7): 40 mg via INTRAVENOUS
  Filled 2016-06-30 (×7): qty 1

## 2016-06-30 MED ORDER — NITROGLYCERIN 0.4 MG SL SUBL: 0.4000 mg | SUBLINGUAL_TABLET | SUBLINGUAL | Status: DC | PRN

## 2016-06-30 MED ORDER — ACETAMINOPHEN 650 MG RE SUPP: 650.0000 mg | Freq: Four times a day (QID) | RECTAL | Status: DC | PRN

## 2016-06-30 MED ORDER — PANTOPRAZOLE SODIUM 40 MG PO TBEC
40.0000 mg | DELAYED_RELEASE_TABLET | Freq: Every day | ORAL | Status: DC
Start: 2016-06-30 — End: 2016-07-03
  Administered 2016-06-30 – 2016-07-03 (×4): 40 mg via ORAL
  Filled 2016-06-30 (×4): qty 1

## 2016-06-30 NOTE — Progress Notes (Signed)
Pt remains off BIPAP at this time.  

## 2016-06-30 NOTE — Progress Notes (Signed)
Pt transported from ED to ICU 1231 on bipap.  Pt tolerated transport well without incident.

## 2016-06-30 NOTE — Care Management Note (Signed)
Case Management Note  Patient Details  Name: STEPEHEN BUSSIE MRN: [id number] Date of Birth: 1956-03-11  Subjective/Objective:    Resp.  Distress on bi-pap                Action/Plan:  Being followed by medicare Date:  June 30, 2016 Chart reviewed for concurrent status and case management needs. Will continue to follow patient progress. Discharge Planning: following for needs Expected discharge date: [id number] Velva Harman, BSN, [place], [place]   Expected Discharge Date:                  Expected Discharge Plan:  [place]  In-House Referral:  NA  Discharge planning Services     Post Acute Care Choice:    Choice offered to:     DME Arranged:    DME Agency:     HH Arranged:    [place] Agency:     Status of Service:  In process, will continue to follow  If discussed at Long Length of Stay Meetings, dates discussed:    Additional Comments:  Leeroy Cha, RN 06/30/2016, 10:35 AM

## 2016-06-30 NOTE — ED Notes (Signed)
Patient on continuous albuterol neb treatment

## 2016-06-30 NOTE — Progress Notes (Signed)
Pharmacy Antibiotic Note  Philip Coleman is a 61 y.o. male admitted on 06/29/2016 with pneumonia.  Pharmacy has been consulted for cefepime dosing.  Pt with PMH of  COPD, CAD status post stenting, diastolic CHF, hyperlipidemia presents to the ER because of sudden onset of shortness of breath last night.  Pt diagnosed with acute respiratory failure with hypoxia and hypercapnia - probably a combination of COPD and pneumonia. Antibiotics for healthcare associated pneumonia.  Plan:  Vancomycin 1000 mg IV q24h x 8 days as per consult  Cefepime 1 gr IV q8h x 8 days ( MD)  Monitor clinical course, renal function, cultures as available   Height: 5\' 7"  (170.2 cm) Weight: 99 lb (44.9 kg) IBW/kg (Calculated) : 66.1  Temp (24hrs), Avg:98.3 F (36.8 C), Min:97.3 F (36.3 C), Max:99.2 F (37.3 C)   Recent Labs Lab 06/29/16 2353 06/30/16 0008 06/30/16 0339  WBC 17.5*  --  19.2*  CREATININE  --  0.80 0.93    Estimated Creatinine Clearance: 55 mL/min (by C-G formula based on SCr of 0.93 mg/dL).    Allergies  Allergen Reactions  . Codeine Hives and Nausea And Vomiting    Antimicrobials this admission: 2/22 Zosyn x1  2/22 vancomycin >>  2/22 cefepime >>   Dose adjustments this admission: ---  Microbiology results: 2/22  BCx: sent 2/22 Sputum: sent  2/22 MRSA PCR: sent  Thank you for allowing pharmacy to be a part of this patient's care.   Royetta Asal, PharmD, BCPS Pager [phone] 06/30/2016 7:00 AM

## 2016-06-30 NOTE — Progress Notes (Signed)
CRITICAL VALUE ALERT  Critical value received:  Troponin 0.13  Date of notification:  06/30/2016   Time of notification:  [id number]  Critical value read back:Yes.    Nurse who received alert:  Rocky Morel   MD notified (1st page): Dr. Lonny Prude  Time of first page:  1600  MD notified (2nd page):  Time of second page:  Responding MD:    Time MD responded:

## 2016-06-30 NOTE — Progress Notes (Signed)
Initial Nutrition Assessment  DOCUMENTATION CODES:   Severe malnutrition in context of acute illness/injury, Underweight  INTERVENTION:  - Continue Ensure Enlive TID, each supplement provides 350 kcal and 20 grams of protein - Will order daily multivitamin with minerals.  - RD will continue to monitor for needs.  NUTRITION DIAGNOSIS:   Malnutrition related to chronic illness as evidenced by severe depletion of muscle mass, severe depletion of body fat.  GOAL:   Patient will meet greater than or equal to 90% of their needs  MONITOR:   PO intake, Weight trends, Labs, I & O's  REASON FOR ASSESSMENT:   Other (Comment) (Underweight BMI)  ASSESSMENT:   61 y.o. male with COPD, CAD status post stenting, diastolic CHF, hyperlipidemia presents to the ER because of sudden onset of shortness of breath last night. Patient states he has been having some issues with the oxygen tubing and was not able to use his home oxygen and nebulizer as required. In the ER patient was found to be short of breath wheezing and chest x-ray showing infiltrates.   Pt seen for underweight BMI. He is on BiPAP and has been unable to eat today. Pt was previously seen by an RD on 06/18/16 at [facility]. At that time pt provided the following information:  - He will eat 5-6 small meals a day, sip on kcal containing beverages and eat high protein sources.  - His severe COPD some times makes it so he is so weak he cannot even get out of bed for a few days time.  - He to relies on convenience foods such as frozen meals.  - He wishes to consume Ensure all the time, but has significant financial restrictions and says he is unable to purchase any type of Ensure supplement.    Physical assessment today consistent with findings from 2/10 of severe muscle and severe fat wasting to all areas. Weight has been stable since the beginning of December 2017.  Medications reviewed; 10 mg rectal Dulcolax x1 dose today, 40 mg IV  Solu-medrol BID, 1 mineral oil enema x1 today, 40 gm oral Protonix/day. Labs reviewed; Cl: 95 mmol/L.   Diet Order:  Diet Heart Room service appropriate? Yes; Fluid consistency: Thin  Skin:  Reviewed, no issues  Last BM:  2/22  Height:   Ht Readings from Last 1 Encounters:  06/30/16 5\' 7"  (1.702 m)    Weight:   Wt Readings from Last 1 Encounters:  06/30/16 99 lb (44.9 kg)    Ideal Body Weight:  67.27 kg  BMI:  Body mass index is 15.51 kg/m.  Estimated Nutritional Needs:   Kcal:  1570-1795 (35-40 kcal/kg)  Protein:  63-72 grams (1.4-1.6 grams/kg)  Fluid:  >/= 1.6 L/day  EDUCATION NEEDS:   No education needs identified at this time    Jarome Matin, MS, RD, LDN, CNSC Inpatient Clinical Dietitian Pager # [phone] After hours/weekend pager # [phone]

## 2016-06-30 NOTE — Progress Notes (Addendum)
RN called critical troponin of .4, up from .13. Pt has no CP. EKG normal today. Came in with hypoxic respiratory failure, so feel this is likely demand ischemia given no CP.  Pt with hx CAD and is already on anticoagulation and other appropriate CAD meds. KJKG, NP [facility] Update: Trop now .84. No CP. R/p EKG without acute changes. As above, already anticoagulated.  KJKG, NP [facility]

## 2016-06-30 NOTE — H&P (Addendum)
History and Physical    Philip Coleman [id number] DOB: 1956-03-08 DOA: 06/29/2016  PCP: Berkley Harvey, NP  Patient coming from: Home.  Chief Complaint: Shortness of breath.  HPI: Philip Coleman is a 61 y.o. male with COPD, CAD status post stenting, diastolic CHF, hyperlipidemia presents to the ER because of sudden onset of shortness of breath last night. Patient states he has been having some issues with the oxygen tubing and was not able to use his home oxygen and nebulizer as required. In the ER patient was found to be short of breath wheezing and chest x-ray showing infiltrates.   ED Course: Patient was placed on BiPAP and given nebulizer and IV antibiotics and steroids and admitted for acute respiratory failure secondary to COPD and pneumonia. Patient denies any chest pain and does not have any clear signs of fluid overload. By the time I examined patient states he is feeling better but still mildly short of breath. Also complains of constipation. Denies any abdominal pain or nausea or vomiting.  Review of Systems: As per HPI, rest all negative.   Past Medical History:  Diagnosis Date  . Anxiety state, unspecified   . Chronic airway obstruction, not elsewhere classified   . Chronic respiratory failure ([place])   . COPD (chronic obstructive pulmonary disease) ([place])   . Cough   . Depressive disorder, not elsewhere classified   . Diarrhea   . Headache(784.0)   . Hodgkin's disease 1993   with abdominal surgical scar -pt unable to describe   . Lumbago   . NSTEMI (non-ST elevated myocardial infarction) ([place])   . Other dyspnea and respiratory abnormality   . Other malaise and fatigue   . Pneumonia   . Spontaneous pneumothorax    left  . Spontaneous pneumothorax    right  . Thyroid disease    hyper, not on meds  . Unspecified constipation   . Weight loss     Past Surgical History:  Procedure Laterality Date  . CHEST TUBE INSERTION     left  . CHEST TUBE INSERTION     right  . LEFT HEART CATHETERIZATION WITH CORONARY ANGIOGRAM N/A 11/21/2013   Procedure: LEFT HEART CATHETERIZATION WITH CORONARY ANGIOGRAM;  Surgeon: Peter M Martinique, MD;  Location: [facility] CATH LAB;  Service: Cardiovascular;  Laterality: N/A;  . PERCUTANEOUS CORONARY INTERVENTION-BALLOON ONLY  11/22/2013   Procedure: PERCUTANEOUS CORONARY INTERVENTION-BALLOON ONLY;  Surgeon: Jettie Booze, MD;  Location: [facility] CATH LAB;  Service: Cardiovascular;;  CFX  . PERCUTANEOUS CORONARY STENT INTERVENTION (PCI-S) N/A 11/22/2013   Procedure: PERCUTANEOUS CORONARY STENT INTERVENTION (PCI-S);  Surgeon: Jettie Booze, MD;  Location: [facility] CATH LAB;  Service: Cardiovascular;  Laterality: N/A;  LAD  . SPLENECTOMY  1987  . teeth extracted for dentures  2012     reports that he has been smoking Cigarettes.  He has a 10.25 pack-year smoking history. He uses smokeless tobacco. He reports that he does not drink alcohol or use drugs.  Allergies  Allergen Reactions  . Codeine Hives and Nausea And Vomiting    Family History  Problem Relation Age of Onset  . Cancer Mother 45  . Hyperlipidemia Mother   . Diabetes Mother   . Pneumonia Father   . Cancer Sister   . Stroke Sister   . Heart attack Neg Hx     Prior to Admission medications   Medication Sig Start Date End Date Taking? Authorizing Provider  albuterol (PROVENTIL HFA;VENTOLIN HFA) 108 (90 Base)  MCG/ACT inhaler Inhale 2 puffs into the lungs every 6 (six) hours as needed for wheezing or shortness of breath.    Historical Provider, MD  albuterol (PROVENTIL) (2.5 MG/3ML) 0.083% nebulizer solution Take 2.5 mg by nebulization every 6 (six) hours as needed for wheezing or shortness of breath.    Historical Provider, MD  atorvastatin (LIPITOR) 40 MG tablet Take 40 mg by mouth every evening.    Historical Provider, MD  budesonide-formoterol (SYMBICORT) 160-4.5 MCG/ACT inhaler Inhale 2 puffs into the lungs 2 (two) times daily.    Historical Provider, MD    clonazePAM (KLONOPIN) 0.5 MG tablet Take 0.5 mg by mouth 2 (two) times daily as needed for anxiety.    Historical Provider, MD  clopidogrel (PLAVIX) 75 MG tablet Take 75 mg by mouth daily.    Historical Provider, MD  feeding supplement, ENSURE ENLIVE, (ENSURE ENLIVE) LIQD Take 237 mLs by mouth 3 (three) times daily between meals. 06/05/16   Mariel Aloe, MD  Ipratropium-Albuterol (COMBIVENT RESPIMAT) 20-100 MCG/ACT AERS respimat Inhale 1 puff into the lungs every 6 (six) hours as needed for wheezing or shortness of breath.    Historical Provider, MD  isosorbide mononitrate (IMDUR) 30 MG 24 hr tablet Take 15 mg by mouth daily.    Historical Provider, MD  levofloxacin (LEVAQUIN) 500 MG tablet Take 1 tablet (500 mg total) by mouth daily. For 4days 06/21/16   Domenic Polite, MD  nitroGLYCERIN (NITROSTAT) 0.4 MG SL tablet Place 1 tablet (0.4 mg total) under the tongue every 5 (five) minutes as needed for chest pain. 01/25/16   Jettie Booze, MD  pantoprazole (PROTONIX) 40 MG tablet Take 1 tablet (40 mg total) by mouth daily. 03/06/14   Jettie Booze, MD  predniSONE (DELTASONE) 20 MG tablet Take 1-2 tablets (20-40 mg total) by mouth daily with breakfast. Take 40mg  for 2days then 20mg  for 2days then STOP 06/21/16   Domenic Polite, MD  sertraline (ZOLOFT) 100 MG tablet Take 100 mg by mouth daily.    Historical Provider, MD  tamsulosin (FLOMAX) 0.4 MG CAPS capsule Take 0.4 mg by mouth daily after supper.     Historical Provider, MD  traMADol (ULTRAM) 50 MG tablet Take 100 mg by mouth every 12 (twelve) hours as needed for moderate pain.    Historical Provider, MD    Physical Exam: Vitals:   06/29/16 2350 06/30/16 0011 06/30/16 0052 06/30/16 0200  BP:   161/83 189/90  Pulse:   106 104  Resp:   25 22  Temp:      TempSrc:      SpO2: 94%  100% 99%  Weight:  44.9 kg (99 lb)    Height:  5\' 7"  (1.702 m)        Constitutional: Moderately built and nourished. Vitals:   06/29/16 2350 06/30/16  0011 06/30/16 0052 06/30/16 0200  BP:   161/83 189/90  Pulse:   106 104  Resp:   25 22  Temp:      TempSrc:      SpO2: 94%  100% 99%  Weight:  44.9 kg (99 lb)    Height:  5\' 7"  (1.702 m)     Eyes: Anicteric no pallor. ENMT: No discharge from the ears eyes nose and mouth. Neck: No mass felt. No JVD appreciated. Respiratory: Bilateral air entry appears tight. Cardiovascular: S1-S2 no murmurs appreciated. Abdomen: Soft nontender bowel sounds present. No guarding or rigidity. Musculoskeletal: No edema. No joint effusion. Skin: No rash. Skin appears warm.  Neurologic: Alert awake oriented to time place and person. Moves all extremities. Psychiatric: Appears normal. Normal affect.   Labs on Admission: I have personally reviewed following labs and imaging studies  CBC:  Recent Labs Lab 06/29/16 2353 06/30/16 0008  WBC 17.5*  --   NEUTROABS 10.5*  --   HGB 11.2* 12.2*  HCT 36.9* 36.0*  MCV 86.2  --   PLT 400  --    Basic Metabolic Panel:  Recent Labs Lab 06/30/16 0008  NA 141  K 4.0  CL 95*  GLUCOSE 121*  BUN 7  CREATININE 0.80   GFR: Estimated Creatinine Clearance: 63.9 mL/min (by C-G formula based on SCr of 0.8 mg/dL). Liver Function Tests: No results for input(s): AST, ALT, ALKPHOS, BILITOT, PROT, ALBUMIN in the last 168 hours. No results for input(s): LIPASE, AMYLASE in the last 168 hours. No results for input(s): AMMONIA in the last 168 hours. Coagulation Profile: No results for input(s): INR, PROTIME in the last 168 hours. Cardiac Enzymes: No results for input(s): CKTOTAL, CKMB, CKMBINDEX, TROPONINI in the last 168 hours. BNP (last 3 results) No results for input(s): PROBNP in the last 8760 hours. HbA1C: No results for input(s): HGBA1C in the last 72 hours. CBG: No results for input(s): GLUCAP in the last 168 hours. Lipid Profile: No results for input(s): CHOL, HDL, LDLCALC, TRIG, CHOLHDL, LDLDIRECT in the last 72 hours. Thyroid Function Tests: No  results for input(s): TSH, T4TOTAL, FREET4, T3FREE, THYROIDAB in the last 72 hours. Anemia Panel: No results for input(s): VITAMINB12, FOLATE, FERRITIN, TIBC, IRON, RETICCTPCT in the last 72 hours. Urine analysis:    Component Value Date/Time   COLORURINE YELLOW 01/05/2011 0815   APPEARANCEUR CLEAR 01/05/2011 0815   LABSPEC 1.011 01/05/2011 0815   PHURINE 6.5 01/05/2011 0815   GLUCOSEU NEGATIVE 01/05/2011 0815   HGBUR NEGATIVE 01/05/2011 0815   BILIRUBINUR NEGATIVE 01/05/2011 0815   KETONESUR NEGATIVE 01/05/2011 0815   PROTEINUR NEGATIVE 01/05/2011 0815   UROBILINOGEN 0.2 01/05/2011 0815   NITRITE NEGATIVE 01/05/2011 0815   LEUKOCYTESUR NEGATIVE 01/05/2011 0815   Sepsis Labs: @LABRCNTIP (procalcitonin:4,lacticidven:4) )No results found for this or any previous visit (from the past 240 hour(s)).   Radiological Exams on Admission: Dg Chest Portable 1 View  Result Date: 06/30/2016 CLINICAL DATA:  Acute onset of shortness of breath. Tachycardia. Initial encounter. EXAM: PORTABLE CHEST 1 VIEW COMPARISON:  Chest radiograph performed 06/17/2016 FINDINGS: The lungs are well-aerated. Mild right midlung and right basilar airspace opacities raise concern for pneumonia. Peribronchial thickening is noted. There is no evidence of pleural effusion or pneumothorax. The cardiomediastinal silhouette is within normal limits. No acute osseous abnormalities are seen. Postoperative change is noted overlying the left quadrant. IMPRESSION: Mild right midlung and right basilar airspace opacities raise concern for pneumonia. Peribronchial thickening noted. Electronically Signed   By: Garald Balding M.D.   On: 06/30/2016 00:05    EKG: Independently reviewed. Normal sinus rhythm.  Assessment/Plan Principal Problem:   Acute respiratory failure with hypoxia (HCC) Active Problems:   COPD exacerbation (HCC)   CAD in native artery   HCAP (healthcare-associated pneumonia)    1. Acute respiratory failure with  hypoxia and hypercapnia - probably a combination of COPD and pneumonia. Patient is placed on Solu-Medrol nebulizer and Pulmicort. Antibiotics for healthcare associated pneumonia. Continue BiPAP for now. Repeat ABG again in a.m. 2. Elevated blood pressure - patient is on Imdur. I have placed patient on when necessary IV hydralazine. Probably contributing to patient's symptoms at this time. Closely  follow blood pressure trends. 3. CAD status post stenting - denies any chest pain. However since patient has acute onset we will cycle cardiac markers. Continue statins Imdur and antiplatelet agent. 4. History of diastolic CHF last EF measured in July 2015 was 60-65% with grade 1 diastolic dysfunction - appears euvolemic. 5. Constipation - Dulcolax per rectum one dose ordered now. 6. BPH on Flomax. 7. Chronic anemia - follow CBC.  History of alcohol abuse per chart. Closely observe.   DVT prophylaxis: Lovenox. Code Status: Full code.  Family Communication: Discussed with patient.  Disposition Plan: Home.  Consults called: None.  Admission status: Inpatient.    Rise Patience MD [facility] Pager [phone].  If 7PM-7AM, please contact night-coverage [email or web address] Password [email or web address]  06/30/2016, 2:01 AM

## 2016-06-30 NOTE — ED Notes (Signed)
Patient is currently awake and alert, comfortable on bipap with stable VS-MP ST-PIV left ACF intact.

## 2016-06-30 NOTE — Progress Notes (Signed)
Pt currently on 4 LPM [place] and tolerating well at this time.  Pt has no increase in WOB at this time, BIPAP not indicated at this time.  RT to monitor and assess as needed.

## 2016-06-30 NOTE — Progress Notes (Signed)
Patient seen and examined at bedside, patient admitted after midnight, please see earlier detailed admission note by Dr. Hal Hope. Briefly, patient presented Mr. failure and was placed on BiPAP. Today is complaining of constipation. Will attempt enemas to try and relieve his impaction.   Cordelia Poche, MD [facility] 06/30/2016, 6:48 AM Pager: [phone]

## 2016-06-30 NOTE — Progress Notes (Signed)
RN found patient digging in rectum trying to "pull out" stool. Explained to patient that he was already disimpacted this morning and that is was unsanitary to touch fecal matter to his face. Dr. Lonny Prude called about patient anxiety and unsuccessful results with enemas. miralax q4h and ativan ordered. Patient cleaned up again and settled in bed.

## 2016-07-01 ENCOUNTER — Inpatient Hospital Stay (HOSPITAL_COMMUNITY): Payer: Medicare Other

## 2016-07-01 DIAGNOSIS — I2510 Atherosclerotic heart disease of native coronary artery without angina pectoris: Secondary | ICD-10-CM

## 2016-07-01 DIAGNOSIS — J9601 Acute respiratory failure with hypoxia: Secondary | ICD-10-CM

## 2016-07-01 DIAGNOSIS — J441 Chronic obstructive pulmonary disease with (acute) exacerbation: Secondary | ICD-10-CM

## 2016-07-01 DIAGNOSIS — I248 Other forms of acute ischemic heart disease: Secondary | ICD-10-CM

## 2016-07-01 DIAGNOSIS — I509 Heart failure, unspecified: Secondary | ICD-10-CM

## 2016-07-01 DIAGNOSIS — J189 Pneumonia, unspecified organism: Principal | ICD-10-CM

## 2016-07-01 LAB — TROPONIN I
TROPONIN I: 1.04 ng/mL — AB (ref <0.03)
TROPONIN I: 0.87 ng/mL — AB (ref <0.03)
Troponin I: 0.84 ng/mL (ref <0.03)

## 2016-07-01 LAB — RESPIRATORY PANEL BY PCR
Adenovirus: NOT DETECTED
BORDETELLA PERTUSSIS-RVPCR: NOT DETECTED
CORONAVIRUS 229E-RVPPCR: NOT DETECTED
CORONAVIRUS HKU1-RVPPCR: NOT DETECTED
CORONAVIRUS OC43-RVPPCR: NOT DETECTED
Chlamydophila pneumoniae: NOT DETECTED
Coronavirus NL63: NOT DETECTED
Influenza A: NOT DETECTED
Influenza B: NOT DETECTED
METAPNEUMOVIRUS-RVPPCR: NOT DETECTED
Mycoplasma pneumoniae: NOT DETECTED
PARAINFLUENZA VIRUS 1-RVPPCR: NOT DETECTED
PARAINFLUENZA VIRUS 2-RVPPCR: NOT DETECTED
PARAINFLUENZA VIRUS 3-RVPPCR: NOT DETECTED
Parainfluenza Virus 4: NOT DETECTED
RESPIRATORY SYNCYTIAL VIRUS-RVPPCR: NOT DETECTED
RHINOVIRUS / ENTEROVIRUS - RVPPCR: NOT DETECTED

## 2016-07-01 LAB — BASIC METABOLIC PANEL
Anion gap: 8 (ref 5–15)
BUN: 23 mg/dL — AB (ref 6–20)
CO2: 35 mmol/L — ABNORMAL HIGH (ref 22–32)
CREATININE: 0.76 mg/dL (ref 0.61–1.24)
Calcium: 8.7 mg/dL — ABNORMAL LOW (ref 8.9–10.3)
Chloride: 91 mmol/L — ABNORMAL LOW (ref 101–111)
GFR calc Af Amer: >60 mL/min (ref >60)
GFR calc non Af Amer: >60 mL/min (ref >60)
GLUCOSE: 151 mg/dL — AB (ref 65–99)
Potassium: 5.1 mmol/L (ref 3.5–5.1)
Sodium: 134 mmol/L — ABNORMAL LOW (ref 135–145)

## 2016-07-01 LAB — CBC
HEMATOCRIT: 31.5 % — AB (ref 39.0–52.0)
Hemoglobin: 9.7 g/dL — ABNORMAL LOW (ref 13.0–17.0)
MCH: 26.6 pg (ref 26.0–34.0)
MCHC: 30.8 g/dL (ref 30.0–36.0)
MCV: 86.3 fL (ref 78.0–100.0)
PLATELETS: 314 10*3/uL (ref 150–400)
RBC: 3.65 MIL/uL — ABNORMAL LOW (ref 4.22–5.81)
RDW: 16.2 % — AB (ref 11.5–15.5)
WBC: 22 10*3/uL — ABNORMAL HIGH (ref 4.0–10.5)

## 2016-07-01 LAB — ECHOCARDIOGRAM COMPLETE
Height: 67 in
Weight: 1584 oz

## 2016-07-01 MED ORDER — IPRATROPIUM-ALBUTEROL 0.5-2.5 (3) MG/3ML IN SOLN
3.0000 mL | Freq: Three times a day (TID) | RESPIRATORY_TRACT | Status: DC
  Administered 2016-07-01 – 2016-07-03 (×7): 3 mL via RESPIRATORY_TRACT
  Filled 2016-07-01 (×7): qty 3

## 2016-07-01 NOTE — Progress Notes (Signed)
  Echocardiogram 2D Echocardiogram has been performed.  Tresa Res 07/01/2016, 1:14 PM

## 2016-07-01 NOTE — Consult Note (Signed)
CARDIOLOGY CONSULT NOTE   Patient ID: Philip Coleman MRN: [id number] DOB/AGE: 1955-10-26 61 y.o.  Admit date: 06/29/2016  Primary Physician   Berkley Harvey, NP Primary Cardiologist   Dr.  Irish Lack Reason for Consultation  Elevated troponin Requesting Physician  Dr. Lonny Prude  HPI: Philip Coleman is a 61 y.o. male with a history of CAD, COPD, hyperlipidemia, chronic respiratory failure, and ongoing tobacco smoking presents for shortness of breath being admitted for pneumonia and COPD exacerbation. Cardiology is asked for evaluation of elevated troponin.  Cath 11/22/13  IMPRESSIONS:  1. Moderate disease in the distal left main coronary artery. 2. Successful PCI of the mid left anterior descending artery with a 3.0 x 38 mm Promus drug-eluting stent, postdilated to 3.6 mm in diameter.. 3. Successful balloon angioplasty of the mid left circumflex artery with a 2.0 x 12 balloon. There is a residual 50% stenosis in the mid circumflex.  Due to the distal left main disease and ostial circumflex disease, we were unable to pass a stent to the mid circumflex.  We stopped after angioplasty to avoid causing any trauma to the distal left main.  Last seen by Dr. Irish Lack 01/25/16. Patient was to taken off antianginal therapy. Advised trial of taking off isosorbide, and restart if chest pain.  He was recently admitted 06/17/16-06/21/16 with acute on chronic respiratory failure secondary COPD exacerbation. Treated with antibiotic and steroids and then discharged. Patient had a pleuritic chest pain during this admission.  Came to emergency room via EMS 2/21 for constant shortness of breath. He was found to have a wheezing and chest x-ray showing infiltrates. He was again admitted for acute on chronic respiratory failure 2/2 COPD exacerbation and pneumonia. He was placed on BiPAP, IV antibiotics, nebulizers and steroids. Cardiology is asked For increasing troponin <0.03--0.13-->0.4-->0.84-->1.04.  Patient denies  any chest pain. EKG on admission yesterday showed normal sinus rhythm with nonspecific ST abnormality which appears chronic to prior EKG. Repeat EKG this morning showed normal sinus rhythm at rate of 94 bpm with subtle ST elevation in inferior leads. (personally reviewed)  Echocardiogram has been done this admission however pending reading. He says states that currently he smoked less than one pack a cigarettes per month.  Past Medical History:  Diagnosis Date  . Anxiety state, unspecified   . Chronic airway obstruction, not elsewhere classified   . Chronic respiratory failure ([place])   . COPD (chronic obstructive pulmonary disease) ([place])   . Cough   . Depressive disorder, not elsewhere classified   . Diarrhea   . Headache(784.0)   . Hodgkin's disease 1993   with abdominal surgical scar -pt unable to describe   . Lumbago   . NSTEMI (non-ST elevated myocardial infarction) ([place])   . Other dyspnea and respiratory abnormality   . Other malaise and fatigue   . Pneumonia   . Spontaneous pneumothorax    left  . Spontaneous pneumothorax    right  . Thyroid disease    hyper, not on meds  . Unspecified constipation   . Weight loss      Past Surgical History:  Procedure Laterality Date  . CHEST TUBE INSERTION     left  . CHEST TUBE INSERTION     right  . LEFT HEART CATHETERIZATION WITH CORONARY ANGIOGRAM N/A 11/21/2013   Procedure: LEFT HEART CATHETERIZATION WITH CORONARY ANGIOGRAM;  Surgeon: Peter M Martinique, MD;  Location: [facility] CATH LAB;  Service: Cardiovascular;  Laterality: N/A;  . PERCUTANEOUS CORONARY INTERVENTION-BALLOON ONLY  11/22/2013   Procedure: PERCUTANEOUS CORONARY INTERVENTION-BALLOON ONLY;  Surgeon: Jettie Booze, MD;  Location: [facility] CATH LAB;  Service: Cardiovascular;;  CFX  . PERCUTANEOUS CORONARY STENT INTERVENTION (PCI-S) N/A 11/22/2013   Procedure: PERCUTANEOUS CORONARY STENT INTERVENTION (PCI-S);  Surgeon: Jettie Booze, MD;  Location: [facility] CATH LAB;  Service:  Cardiovascular;  Laterality: N/A;  LAD  . SPLENECTOMY  1987  . teeth extracted for dentures  2012    Allergies  Allergen Reactions  . Codeine Hives and Nausea And Vomiting    I have reviewed the patient's current medications . atorvastatin  40 mg Oral QPM  . budesonide (PULMICORT) nebulizer solution  0.25 mg Nebulization BID  . ceFEPime (MAXIPIME) IV  1 g Intravenous Q8H  . clopidogrel  75 mg Oral Daily  . enoxaparin (LOVENOX) injection  30 mg Subcutaneous Q24H  . feeding supplement (ENSURE ENLIVE)  237 mL Oral TID BM  . ipratropium-albuterol  3 mL Nebulization TID  . isosorbide mononitrate  15 mg Oral Daily  . methylPREDNISolone (SOLU-MEDROL) injection  40 mg Intravenous Q12H  . multivitamins with iron  1 tablet Oral Daily  . pantoprazole  40 mg Oral Daily  . polyethylene glycol  17 g Oral Q4H  . sertraline  100 mg Oral Daily  . tamsulosin  0.4 mg Oral QPC supper  . vancomycin  1,000 mg Intravenous Q24H    acetaminophen **OR** acetaminophen, clonazePAM, hydrALAZINE, ipratropium-albuterol, nitroGLYCERIN, ondansetron **OR** ondansetron (ZOFRAN) IV, traMADol  Prior to Admission medications   Medication Sig Start Date End Date Taking? Authorizing Provider  albuterol (PROVENTIL HFA;VENTOLIN HFA) 108 (90 Base) MCG/ACT inhaler Inhale 2 puffs into the lungs every 6 (six) hours as needed for wheezing or shortness of breath.   Yes Historical Provider, MD  atorvastatin (LIPITOR) 40 MG tablet Take 40 mg by mouth every evening.   Yes Historical Provider, MD  budesonide-formoterol (SYMBICORT) 160-4.5 MCG/ACT inhaler Inhale 2 puffs into the lungs 2 (two) times daily.   Yes Historical Provider, MD  clonazePAM (KLONOPIN) 0.5 MG tablet Take 0.5 mg by mouth 2 (two) times daily as needed for anxiety.   Yes Historical Provider, MD  clopidogrel (PLAVIX) 75 MG tablet Take 75 mg by mouth daily.   Yes Historical Provider, MD  feeding supplement, ENSURE ENLIVE, (ENSURE ENLIVE) LIQD Take 237 mLs by mouth  3 (three) times daily between meals. 06/05/16  Yes Mariel Aloe, MD  Ipratropium-Albuterol (COMBIVENT RESPIMAT) 20-100 MCG/ACT AERS respimat Inhale 1 puff into the lungs every 6 (six) hours as needed for wheezing or shortness of breath.   Yes Historical Provider, MD  isosorbide mononitrate (IMDUR) 30 MG 24 hr tablet Take 15 mg by mouth daily.   Yes Historical Provider, MD  nitroGLYCERIN (NITROSTAT) 0.4 MG SL tablet Place 1 tablet (0.4 mg total) under the tongue every 5 (five) minutes as needed for chest pain. 01/25/16  Yes Jettie Booze, MD  pantoprazole (PROTONIX) 40 MG tablet Take 1 tablet (40 mg total) by mouth daily. 03/06/14  Yes Jettie Booze, MD  sertraline (ZOLOFT) 100 MG tablet Take 100 mg by mouth daily.   Yes Historical Provider, MD  tamsulosin (FLOMAX) 0.4 MG CAPS capsule Take 0.4 mg by mouth daily after supper.    Yes Historical Provider, MD  traMADol (ULTRAM) 50 MG tablet Take 100 mg by mouth every 12 (twelve) hours as needed for moderate pain.   Yes Historical Provider, MD  albuterol (PROVENTIL) (2.5 MG/3ML) 0.083% nebulizer solution Take 2.5 mg by nebulization every  6 (six) hours as needed for wheezing or shortness of breath.    Historical Provider, MD     Social History   Social History  . Marital status: Legally Separated    Spouse name: N/A  . Number of children: N/A  . Years of education: N/A   Occupational History  . Disabled    Social History Main Topics  . Smoking status: Current Every Day Smoker    Packs/day: 0.25    Years: 41.00    Types: Cigarettes  . Smokeless tobacco: Current User     Comment: nothinhg will work. Tried everything 09/24/12--5 cigs per day  . Alcohol use No     Comment: socially  . Drug use: No  . Sexual activity: Yes    Birth control/ protection: Condom     Comment: perfer not to say    Other Topics Concern  . Not on file   Social History Narrative   Lives with 2 male friends.      Family Status  Relation Status  .  Mother Deceased at age 32  . Father Deceased  . Sister Deceased at age 67  . Brother Alive  . Sister Alive  . Sister Alive  . Brother Alive  . Neg Hx    Family History  Problem Relation Age of Onset  . Cancer Mother 58  . Hyperlipidemia Mother   . Diabetes Mother   . Pneumonia Father   . Cancer Sister   . Stroke Sister   . Heart attack Neg Hx       ROS:  Full 14 point review of systems complete and found to be negative unless listed above.  Physical Exam: Blood pressure (!) 150/88, pulse 90, temperature 98.3 F (36.8 C), temperature source Oral, resp. rate 18, height 5\' 7"  (1.702 m), weight 99 lb (44.9 kg), SpO2 99 %.  General: Thin frail male appears sick Head: Eyes PERRLA, No xanthomas. Normocephalic and atraumatic, oropharynx without edema or exudate.  Lungs: Resp regular and unlabored, Diminish breath sound Heart: RRR no s3, s4, or murmurs..   Neck: No carotid bruits. No lymphadenopathy.  No JVD. Abdomen: Bowel sounds present, abdomen soft and non-tender without masses or hernias noted. Msk:  No spine or cva tenderness. No weakness, no joint deformities or effusions. Extremities: No clubbing, cyanosis or edema. DP/PT/Radials 2+ and equal bilaterally. Neuro: Alert and oriented X 3. No focal deficits noted. Psych:  Good affect, responds appropriately Skin: No rashes or lesions noted.  Labs:   Lab Results  Component Value Date   WBC 22.0 (H) 07/01/2016   HGB 9.7 (L) 07/01/2016   HCT 31.5 (L) 07/01/2016   MCV 86.3 07/01/2016   PLT 314 07/01/2016   No results for input(s): INR in the last 72 hours.  Recent Labs Lab 07/01/16 0200  NA 134*  K 5.1  CL 91*  CO2 35*  BUN 23*  CREATININE 0.76  CALCIUM 8.7*  GLUCOSE 151*   Magnesium  Date Value Ref Range Status  11/21/2013 2.0 1.5 - 2.5 mg/dL Final    Recent Labs  06/30/16 1538 06/30/16 2009 07/01/16 0200 07/01/16 0824  TROPONINI 0.13* 0.40* 0.84* 1.04*    Recent Labs  06/30/16 0007  TROPIPOC 0.01    Pro B Natriuretic peptide (BNP)  Date/Time Value Ref Range Status  04/01/2012 06:24 AM 686.8 (H) 0 - 125 pg/mL Final   Lab Results  Component Value Date   CHOL 230 (H) 11/21/2013   HDL 62 11/21/2013  [place] 159 (H) 11/21/2013   TRIG 91 06/20/2016   Lab Results  Component Value Date   DDIMER 0.28 11/21/2013   Lipase  Date/Time Value Ref Range Status  06/17/2016 06:20 AM 16 11 - 51 U/L Final   TSH  Date/Time Value Ref Range Status  11/21/2013 01:05 PM 0.747 0.350 - 4.500 uIU/mL Final   No results found for: VITAMINB12, FOLATE, FERRITIN, TIBC, IRON, RETICCTPCT  Echo:  11/22/13 Study Conclusions  - Left ventricle: The cavity size was normal. There was mild focal basal hypertrophy of the septum. Systolic function was normal. The estimated ejection fraction was in the range of 60% to 65%. Wall motion was normal; there were no regional wall motion abnormalities. Doppler parameters are consistent with abnormal left ventricular relaxation (grade 1 diastolic dysfunction). Doppler parameters are consistent with high ventricular filling pressure. - Mitral valve: Calcified annulus. - Atrial septum: Echo contrast study showed no right-to-left atrial level shunt, at baseline or with provocation.  Impressions:  - Compared to the prior study, there has been no significant interval change.  Cath 11/21/13 Hemodynamics: AO 153/91 mean 121 mm Hg LV 153/28 mm Hg  Coronary angiography: Coronary dominance: right  Left mainstem: The left main is short and calcified with a 50% distal stenosis.  Left anterior descending (LAD): The LAD is a large vessel that wraps around the apex. There is a 90-95% stenosis in the proximal vessel. The first diagonal is a moderate sized vessel and has a high takeoff. It bifurcates proximally and is subtotally occluded at the origin. The second diagonal is small with 95% stenosis proximally.  Left circumflex (LCx): The LCx has a  90% mid vessel stenosis after the takeoff of a small OM1. The distal LCx divides into 2 OM branches.  Right coronary artery (RCA): The RCA has mild disease less than 20%.   Left ventriculography: Left ventricular systolic function is abnormal, LVEF is estimated at 35-40%. There is moderate to severe hypokinesis of the mid to distal anterior wall, apex, and distal inferior wall. There is no significant mitral regurgitation   Final Conclusions:   1. Severe 2 vessel obstructive CAD 2. Moderate LV dysfunction 3. Elevated LVEDP.  Recommendations: This patient has complex 2 vessel obstructive CAD. He has modest distal left main disease. Given his overall medical condition I think his risk for CABG is probable prohibitive. Will review with interventional colleagues and consider complex PCI of the LAD and LCx lesions. Need to optimize volume status with diuresis and improve BP control.   Cath 11/22/13 IMPRESSIONS:  4. Moderate disease in the distal left main coronary artery. 5. Successful PCI of the mid left anterior descending artery with a 3.0 x 38 mm Promus drug-eluting stent, postdilated to 3.6 mm in diameter.. 6. Successful balloon angioplasty of the mid left circumflex artery with a 2.0 x 12 balloon. There is a residual 50% stenosis in the mid circumflex.  Due to the distal left main disease and ostial circumflex disease, we were unable to pass a stent to the mid circumflex.  We stopped after angioplasty to avoid causing any trauma to the distal left main.   RECOMMENDATION:  Continue dual antiplatelet therapy for at least a year. Continue antianginal therapy. I think the majority of his symptoms were coming from the LAD lesion. This was successfully treated with a stent. The circumflex lesion could not be stented.  Continue aggressive medical therapy.  He will need a repeat echo in 4-6 weeks to see if his LV dysfunction  has improved.  Anticipate d/c home in the next 1-2 days.  Needs medical  therapy for his heart failure as well.   Radiology:  Dg Chest Portable 1 View  Result Date: 06/30/2016 CLINICAL DATA:  Acute onset of shortness of breath. Tachycardia. Initial encounter. EXAM: PORTABLE CHEST 1 VIEW COMPARISON:  Chest radiograph performed 06/17/2016 FINDINGS: The lungs are well-aerated. Mild right midlung and right basilar airspace opacities raise concern for pneumonia. Peribronchial thickening is noted. There is no evidence of pleural effusion or pneumothorax. The cardiomediastinal silhouette is within normal limits. No acute osseous abnormalities are seen. Postoperative change is noted overlying the left quadrant. IMPRESSION: Mild right midlung and right basilar airspace opacities raise concern for pneumonia. Peribronchial thickening noted. Electronically Signed   By: Garald Balding M.D.   On: 06/30/2016 00:05    ASSESSMENT AND PLAN:     Philip Coleman is a 61 y.o. male with a history of CAD, COPD, hyperlipidemia, chronic respiratory failure, and ongoing tobacco smoking presents for shortness of breath being admitted for pneumonia and COPD exacerbation. Cardiology is asked for evaluation of elevated troponin.   1. Elevated troponin - No chest pain. Patient has chronic dyspnea due to chronic respiratory failure, COPD and ongoing tobacco abuse. - No chest pain prior to admission. His troponin is trending up. <0.03--0.13-->0.4-->0.84-->1.04. - Likely demand in setting of acute illness (WBC trending up).  Echocardiogram has been done. Primiminary review showed normal Lv function. Outpatient follow with Dr. Irish Lack once resolved from acute illness and he may decide stress test if needed.  - No clear explantation of subtle VT changes in inferior leads compared to EKG of 06/17/16.   2. CAD s/p DES in 2015 - He does have residual disease in circumflex and distal left main.  - Continue Plavix and Imdur.   3. Acute respiratory failure in setting of COPD exacerbation and pneumonia - Per  primary team. WBC trending up since admission.   4. Elevated BP - No prior diagnosis of HTN. On Imdur 15mg  qd.   5. Chronic diastolic CHF - BNP of [id number] on admission. Euvolemic.   SignedLeanor Kail, PA 07/01/2016, 2:37 PM Pager [phone]    I have seen and examined the patient along with Bhagat,Bhavinkumar, PA.  I have reviewed the chart, notes and new data.  I agree with PA's note.  Key new complaints: never had angina; has a history of mediastinal XRT for Hodgkin's lymphoma Key examination changes: CV exam is normal, he is cachectic, has very diminished breath sounds throughout Key new findings / data: Low risk ECG; minor increase in troponin; reviewed echo - has normal wall motion and normal LVEF  PLAN: Suspect demand ischemia, will not pursue any additional inpatient workup. Will arrange f/u with Dr. Irish Lack at [place].  Sanda Klein, MD, [place] [phone] 07/01/2016, 4:06 PM

## 2016-07-01 NOTE — Progress Notes (Signed)
PROGRESS NOTE    Philip Coleman  [id number] DOB: 1956-01-21 DOA: 06/29/2016 PCP: Berkley Harvey, NP   Brief Narrative: Philip Coleman is a 61 y.o. male with a history of COPD, CAD status post stenting, diastolic CHF, hyperlipidemia. He presented in respiratory distress requiring BiPAP and found to have pneumonia and COPD exacerbation. He has been weaned off of BiPAP.   Assessment & Plan:   Principal Problem:   Acute respiratory failure with hypoxia (HCC) Active Problems:   COPD exacerbation (HCC)   CAD in native artery   HCAP (healthcare-associated pneumonia)   Acute respiratory failure with hypoxia Secondary to COPD exacerbation vs pneumonia vs combination. Currently weaned to [place] from BiPAP. -continue to wean O2 to baseline 3-4 L  COPD exacerbation COPD Chronic respiratory failure -continue solu-medrol -continue pulmicort -continue Duoneb -oxygen  HCAP -blood cultures pending -continue vancomycin and cefepime  Elevated troponin No chest pain or EKG changes, however, presented with sudden onset of dyspnea and has a history of CAD with stents. -cardiology consult -trend troponin -echocardiogram  CAD s/p stent Troponin elevated. No chest pain -continue Imdur -continue plavix -continue atorvastatin  Constipation Resolved with enemas and MiraLAX  Chronic diastolic CHF Last EF of 123456 with grade 1 diastolic dysfunction. Euvolemic -Echocardiogram  BPH -continue Flomax  Chronic anemia Normocytic. Slight drop today . No evidence of bleeding.    DVT prophylaxis: Lovenox Code Status: Full code Family Communication: None at bedside Disposition Plan: Discharge home in 1-2 days   Consultants:   Cardiology  Procedures:   Echocardiogram (07/01/2016)  Antimicrobials:   Vancomycin  Cefepime    Subjective: Patient reports no chest pain, dyspnea or abdominal pain.  Objective: Vitals:   06/30/16 2030 06/30/16 2329 07/01/16 0333 07/01/16 0400   BP:    (!) 148/86  Pulse:    93  Resp:    19  Temp: 98.7 F (37.1 C) 98.4 F (36.9 C) 98.4 F (36.9 C)   TempSrc: Oral Oral Oral   SpO2:    99%  Weight:      Height:        Intake/Output Summary (Last 24 hours) at 07/01/16 0736 Last data filed at 07/01/16 0200  Gross per 24 hour  Intake              350 ml  Output              276 ml  Net               74 ml   Filed Weights   06/30/16 0011  Weight: 44.9 kg (99 lb)    Examination:  General exam: Appears calm and comfortable Respiratory system: Clear to auscultation. Respiratory effort normal. Cardiovascular system: S1 & S2 heard, RRR. No murmur Gastrointestinal system: Abdomen is nondistended, soft and nontender. Normal bowel sounds heard. Central nervous system: Alert and oriented. No focal neurological deficits. Extremities: No edema. No calf tenderness Skin: No cyanosis. No rashes Psychiatry: Judgement and insight appear normal. Mood & affect appropriate.     Data Reviewed: I have personally reviewed following labs and imaging studies  CBC:  Recent Labs Lab 06/29/16 2353 06/30/16 0008 06/30/16 0339 07/01/16 0200  WBC 17.5*  --  19.2* 22.0*  NEUTROABS 10.5*  --   --   --   HGB 11.2* 12.2* 11.1* 9.7*  HCT 36.9* 36.0* 36.3* 31.5*  MCV 86.2  --  87.9 86.3  PLT 400  --  394 [id number]   Basic Metabolic  Panel:  Recent Labs Lab 06/30/16 0008 06/30/16 0339 07/01/16 0200  NA 141 140 134*  K 4.0 4.5 5.1  CL 95* 95* 91*  CO2  --  36* 35*  GLUCOSE 121* 199* 151*  BUN 7 9 23*  CREATININE 0.80 0.93 0.76  CALCIUM  --  9.2 8.7*   GFR: Estimated Creatinine Clearance: 63.9 mL/min (by C-G formula based on SCr of 0.76 mg/dL). Liver Function Tests: No results for input(s): AST, ALT, ALKPHOS, BILITOT, PROT, ALBUMIN in the last 168 hours. No results for input(s): LIPASE, AMYLASE in the last 168 hours. No results for input(s): AMMONIA in the last 168 hours. Coagulation Profile: No results for input(s): INR, PROTIME  in the last 168 hours. Cardiac Enzymes:  Recent Labs Lab 06/30/16 0339 06/30/16 0925 06/30/16 1538 06/30/16 2009 07/01/16 0200  TROPONINI <0.03 <0.03 0.13* 0.40* 0.84*   BNP (last 3 results) No results for input(s): PROBNP in the last 8760 hours. HbA1C: No results for input(s): HGBA1C in the last 72 hours. CBG: No results for input(s): GLUCAP in the last 168 hours. Lipid Profile: No results for input(s): CHOL, HDL, LDLCALC, TRIG, CHOLHDL, LDLDIRECT in the last 72 hours. Thyroid Function Tests: No results for input(s): TSH, T4TOTAL, FREET4, T3FREE, THYROIDAB in the last 72 hours. Anemia Panel: No results for input(s): VITAMINB12, FOLATE, FERRITIN, TIBC, IRON, RETICCTPCT in the last 72 hours. Sepsis Labs: No results for input(s): PROCALCITON, LATICACIDVEN in the last 168 hours.  Recent Results (from the past 240 hour(s))  MRSA PCR Screening     Status: None   Collection Time: 06/30/16  2:59 AM  Result Value Ref Range Status   MRSA by PCR NEGATIVE NEGATIVE Final    Comment:        The GeneXpert MRSA Assay (FDA approved for NASAL specimens only), is one component of a comprehensive MRSA colonization surveillance program. It is not intended to diagnose MRSA infection nor to guide or monitor treatment for MRSA infections.          Radiology Studies: Dg Chest Portable 1 View  Result Date: 06/30/2016 CLINICAL DATA:  Acute onset of shortness of breath. Tachycardia. Initial encounter. EXAM: PORTABLE CHEST 1 VIEW COMPARISON:  Chest radiograph performed 06/17/2016 FINDINGS: The lungs are well-aerated. Mild right midlung and right basilar airspace opacities raise concern for pneumonia. Peribronchial thickening is noted. There is no evidence of pleural effusion or pneumothorax. The cardiomediastinal silhouette is within normal limits. No acute osseous abnormalities are seen. Postoperative change is noted overlying the left quadrant. IMPRESSION: Mild right midlung and right  basilar airspace opacities raise concern for pneumonia. Peribronchial thickening noted. Electronically Signed   By: Garald Balding M.D.   On: 06/30/2016 00:05        Scheduled Meds: . atorvastatin  40 mg Oral QPM  . budesonide (PULMICORT) nebulizer solution  0.25 mg Nebulization BID  . ceFEPime (MAXIPIME) IV  1 g Intravenous Q8H  . clopidogrel  75 mg Oral Daily  . enoxaparin (LOVENOX) injection  30 mg Subcutaneous Q24H  . feeding supplement (ENSURE ENLIVE)  237 mL Oral TID BM  . ipratropium-albuterol  3 mL Nebulization QID  . isosorbide mononitrate  15 mg Oral Daily  . methylPREDNISolone (SOLU-MEDROL) injection  40 mg Intravenous Q12H  . multivitamins with iron  1 tablet Oral Daily  . pantoprazole  40 mg Oral Daily  . polyethylene glycol  17 g Oral Q4H  . sertraline  100 mg Oral Daily  . tamsulosin  0.4 mg Oral QPC  supper  . vancomycin  1,000 mg Intravenous Q24H   Continuous Infusions:   LOS: 1 day     Cordelia Poche [facility] 07/01/2016, 7:36 AM Pager: [phone]  If 7PM-7AM, please contact night-coverage [email or web address] Password [facility] 07/01/2016, 7:36 AM

## 2016-07-02 ENCOUNTER — Inpatient Hospital Stay (HOSPITAL_COMMUNITY): Payer: Medicare Other

## 2016-07-02 DIAGNOSIS — K5900 Constipation, unspecified: Secondary | ICD-10-CM

## 2016-07-02 DIAGNOSIS — K5641 Fecal impaction: Secondary | ICD-10-CM

## 2016-07-02 LAB — CBC
HEMATOCRIT: 32.6 % — AB (ref 39.0–52.0)
HEMOGLOBIN: 10 g/dL — AB (ref 13.0–17.0)
MCH: 26.5 pg (ref 26.0–34.0)
MCHC: 30.7 g/dL (ref 30.0–36.0)
MCV: 86.2 fL (ref 78.0–100.0)
Platelets: 335 10*3/uL (ref 150–400)
RBC: 3.78 MIL/uL — AB (ref 4.22–5.81)
RDW: 16 % — ABNORMAL HIGH (ref 11.5–15.5)
WBC: 18.8 10*3/uL — ABNORMAL HIGH (ref 4.0–10.5)

## 2016-07-02 MED ORDER — POLYETHYLENE GLYCOL 3350 17 G PO PACK
17.0000 g | PACK | Freq: Two times a day (BID) | ORAL | Status: DC
  Administered 2016-07-02 – 2016-07-03 (×3): 17 g via ORAL
  Filled 2016-07-02 (×3): qty 1

## 2016-07-02 MED ORDER — POLYETHYLENE GLYCOL 3350 17 G PO PACK: 17.0000 g | PACK | Freq: Every day | ORAL | Status: DC

## 2016-07-02 MED ORDER — PREDNISONE 20 MG PO TABS: ORAL_TABLET | ORAL | 0 refills | Status: AC

## 2016-07-02 MED ORDER — LEVOFLOXACIN 750 MG PO TABS: 750.0000 mg | ORAL_TABLET | Freq: Every day | ORAL | 0 refills | Status: AC

## 2016-07-02 MED ORDER — LEVOFLOXACIN 750 MG PO TABS
750.0000 mg | ORAL_TABLET | Freq: Every day | ORAL | Status: DC
  Administered 2016-07-02 – 2016-07-03 (×2): 750 mg via ORAL
  Filled 2016-07-02 (×2): qty 1

## 2016-07-02 MED ORDER — BISACODYL 10 MG RE SUPP
10.0000 mg | Freq: Once | RECTAL | Status: AC
  Administered 2016-07-02: 10 mg via RECTAL
  Filled 2016-07-02: qty 1

## 2016-07-02 MED ORDER — POLYETHYLENE GLYCOL 3350 17 G PO PACK: 17.0000 g | PACK | Freq: Two times a day (BID) | ORAL | 0 refills | Status: AC

## 2016-07-02 NOTE — Progress Notes (Signed)
PT Cancellation Note  Patient Details Name: Philip Coleman MRN: [id number] DOB: [birth year]   Cancelled Treatment:    Reason Eval/Treat Not Completed: Fatigue/lethargy limiting ability to participate Pt states he did not sleep last night and just wishes to rest this morning.  Will check back as schedule permits.   [name],KATHrine E 07/02/2016, 10:07 AM [name], PT, DPT 07/02/2016  Pager: [phone]

## 2016-07-02 NOTE — Evaluation (Signed)
Physical Therapy Evaluation Patient Details Name: Philip Coleman MRN: [id number] DOB: [birth year] Today's Date: 07/02/2016   History of Present Illness  61 y.o. male with a history of severe COPD/chronic respiratory failure on 3-4 L of oxygen at home, ongoing tobacco use, CAD, severe malnutrition, anxiety and has had 2 other recent admissions. He presented in respiratory distress requiring BiPAP and was found to have pneumonia and COPD exacerbation  Clinical Impression  Pt admitted with above diagnosis. Pt currently with functional limitations due to the deficits listed below (see PT Problem List). Pt will benefit from skilled PT to increase their independence and safety with mobility to allow discharge to the venue listed below.  Pt reports today is his first time ambulating outside of his room and he does not feel ready for d/c at this time.  Pt with a couple recent admission as well.  Pt has roommates but states they would not provide assist.  Pt required increased in oxygen to 6L O2 to tolerate short distance ambulation with SPO2 91%.  HHPT and rollator recommended after admission a week ago, uncertain of stage of that process.     Follow Up Recommendations Home health PT;Supervision - Intermittent (would benefit from aide)    Equipment Recommendations  Other (comment) (rollator if covered 100% recommended about a week ago with last hospitalization)    Recommendations for Other Services       Precautions / Restrictions Precautions Precautions: Fall Precaution Comments: Home O2 3-4L      Mobility  Bed Mobility Overal bed mobility: Modified Independent                Transfers Overall transfer level: Needs assistance Equipment used: None Transfers: Sit to/from Stand Sit to Stand: Min guard         General transfer comment: min/guard for safety, unsteady however no overt LOB  Ambulation/Gait Ambulation/Gait assistance: Min guard Ambulation Distance (Feet): 80  Feet Assistive device: None Gait Pattern/deviations: Step-through pattern;Narrow base of support;Decreased stride length Gait velocity: decreased    General Gait Details: pt on 4L O2 at rest and requested increase to tolerate ambulation, 6L O2 applied and SpO2 91% with ambulation, pt only able to tolerate short distance  Stairs            Wheelchair Mobility    Modified Rankin (Stroke Patients Only)       Balance                                             Pertinent Vitals/Pain Pain Assessment: No/denies pain    Home Living Family/patient expects to be discharged to:: Private residence Living Arrangements: Non-relatives/Friends Available Help at Discharge: Personal care attendant;Available PRN/intermittently;Friend(s) Type of Home: House Home Access: Stairs to enter Entrance Stairs-Rails: Right Entrance Stairs-Number of Steps: 7 Home Layout: One level Home Equipment: Walker - standard;Cane - single point;Bedside commode;Shower seat;Hand held shower head;Wheelchair - manual;Other (comment) Additional Comments: aide 2 hours /day    Prior Function Level of Independence: Independent with assistive device(s);Needs assistance      ADL's / Homemaking Assistance Needed: aide provided assistance for ADLs and IADLs   Comments: PLOF from recent admission, pt reports he does not wish to use walker - fearful of falling into walker     Hand Dominance        Extremity/Trunk Assessment  Lower Extremity Assessment Lower Extremity Assessment: Generalized weakness (diffuse muscle atrophy)    Cervical / Trunk Assessment Cervical / Trunk Assessment: Kyphotic  Communication   Communication: No difficulties (very few verbalizations)  Cognition Arousal/Alertness: Awake/alert Behavior During Therapy: Flat affect Overall Cognitive Status: Within Functional Limits for tasks assessed                      General Comments      Exercises      Assessment/Plan    PT Assessment Patient needs continued PT services  PT Problem List Decreased activity tolerance;Decreased strength;Decreased mobility;Cardiopulmonary status limiting activity       PT Treatment Interventions DME instruction;Gait training;Stair training;Functional mobility training;Therapeutic activities;Therapeutic exercise;Patient/family education    PT Goals (Current goals can be found in the Care Plan section)  Acute Rehab PT Goals PT Goal Formulation: With patient Time For Goal Achievement: 07/09/16 Potential to Achieve Goals: Good    Frequency Min 3X/week   Barriers to discharge Decreased caregiver support      Co-evaluation               End of Session Equipment Utilized During Treatment: Oxygen;Gait belt Activity Tolerance: Patient limited by fatigue Patient left: with nursing/sitter in room;Other (comment) (bathroom, RN in room) Nurse Communication: Mobility status PT Visit Diagnosis: Difficulty in walking, not elsewhere classified (R26.2)         Time: [id number] PT Time Calculation (min) (ACUTE ONLY): 19 min   Charges:   PT Evaluation $PT Eval Moderate Complexity: 1 Procedure     PT G Codes:         Kenlie Seki,KATHrine E 07/02/2016, 2:58 PM [name], PT, DPT 07/02/2016 Pager: [phone]

## 2016-07-02 NOTE — Care Management Note (Addendum)
Case Management Note  Patient Details  Name: Philip Coleman MRN: [id number] Date of Birth: Mar 13, 1956  Subjective/Objective:   COPD exacerbation, Acute on chronic resp failure,                  Action/Plan: Discharge Planning: AVS reviewed: NCM spoke to pt and lives at home with roommates, but they are limited in the assistance they provide at home. Pt has oxygen at home. Contacted [facility] and his concentrator will only go up to 5L. Pt active with [facility] for HH RN, PT, SW. Contacted [facility] for resumption of care. Notified attending of new order for oxygen need. Contacted [facility] and they will have to deliver tank to home when patient is there. Pt will need [facility] transportation. [facility] cannot give a time of transport.   PCP Berkley Harvey MD  Expected Discharge Date:  07/02/16               Expected Discharge Plan:  [place]  In-House Referral:  NA  Discharge planning Services  CM Consult  Post Acute Care Choice:  Home Health, Resumption of Svcs/PTA Provider Choice offered to:  Patient  DME Arranged:  N/A DME Agency:  NA  HH Arranged:  RN, OT, PT, SW HH Agency:  [place]  Status of Service:  Completed, signed off  If discussed at [place] of Stay Meetings, dates discussed:    Additional Comments:  Erenest Rasher, RN 07/02/2016, 5:11 PM

## 2016-07-02 NOTE — Care Management (Addendum)
SATURATION QUALIFICATIONS: (This note is used to comply with regulatory documentation for home oxygen)  Patient Saturations on Room Air at Rest = cannot tolerate  Patient Saturations on Room Air while Ambulating = cannot tolerate  Patient Saturations on 6 Liters of oxygen while Ambulating = 91%  Please briefly explain why patient needs home oxygen: NCM contacted attending to make aware pt's oxygen tank only goes to 5L, will need a new order for high flow oxygen concentrator at home. Jonnie Finner RN CCM Case Mgmt phone [phone]

## 2016-07-02 NOTE — Discharge Instructions (Signed)
Philip Coleman,  You were admitted because of your breathing. You were treated for pneumonia and a COPD exacerbation. You will go home on Levaquin and steroids. Please complete your course at home. You also have constipation. Please take Miralax twice per day until you are having regular, soft stools every day. Use the Dulcolax suppository if you are still not having a bowel movement.

## 2016-07-02 NOTE — Progress Notes (Signed)
Writer paged MD and received returned call. PT had to move pt's O2 up to 6 L/min via [place] upon ambulation to keep saturations at 91%. Upon sitting pt in chair and trying to titrate O2 down, pt began to protest and yelling to "turn it up, I can't breathe". O2 was returned to 6 L/min via [place].  Resting O2 on 6 L/min via [place] was 97%. Will continue to attempt to titrate O2 down while pt at rest.

## 2016-07-02 NOTE — Progress Notes (Signed)
PROGRESS NOTE    Philip Coleman  [id number] DOB: 1955/08/21 DOA: 06/29/2016 PCP: Berkley Harvey, NP   Brief Narrative: Philip Coleman is a 61 y.o. male with a history of COPD, CAD status post stenting, diastolic CHF, hyperlipidemia. He presented in respiratory distress requiring BiPAP and found to have pneumonia and COPD exacerbation. He has been weaned off of BiPAP.   Assessment & Plan:   Principal Problem:   Acute respiratory failure with hypoxia (HCC) Active Problems:   COPD exacerbation (HCC)   CAD in native artery   HCAP (healthcare-associated pneumonia)   Acute respiratory failure with hypoxia Secondary to COPD exacerbation vs pneumonia vs combination. Currently weaned to [place] from BiPAP. At baseline at rest -PT eval (ambulate with O2)  COPD exacerbation COPD Chronic respiratory failure -continue solu-medrol -continue pulmicort -continue Duoneb -oxygen  HCAP -blood cultures pending -discontinue vancomycin and cefepime -transition to Levaquin  Elevated troponin No chest pain or EKG changes, however, presented with sudden onset of dyspnea and has a history of CAD with stents. -cardiology consult -trend troponin -echocardiogram  CAD s/p stent Troponin elevated. No chest pain -continue Imdur -continue plavix -continue atorvastatin  Constipation Resolved with enemas and MiraLAX  Chronic diastolic CHF Last EF of [id number] with grade 1 diastolic dysfunction. Euvolemic -Echocardiogram  BPH -continue Flomax  Chronic anemia Normocytic. Slight drop today . No evidence of bleeding.  Abdominal pain Likely related to constipation -abdominal x-ray -if constipation, suppository    DVT prophylaxis: Lovenox Code Status: Full code Family Communication: None at bedside Disposition Plan: Discharge home today pending PT evaluation and ambulation   Consultants:   Cardiology  Procedures:   Echocardiogram (07/01/2016)  Antimicrobials:    Vancomycin  Cefepime  Levaquin   Subjective: Patient reports no chest pain, dyspnea or abdominal pain. Patient had a bad night with less sleep  Objective: Vitals:   07/02/16 0559 07/02/16 0741 07/02/16 1439 07/02/16 1500  BP: (!) 161/60   126/66  Pulse: 84   91  Resp: 18   20  Temp: 98 F (36.7 C)   98.5 F (36.9 C)  TempSrc: Oral   Oral  SpO2: 99% 95% 94% 97%  Weight:      Height:        Intake/Output Summary (Last 24 hours) at 07/02/16 1756 Last data filed at 07/02/16 1411  Gross per 24 hour  Intake              560 ml  Output             1251 ml  Net             -691 ml   Filed Weights   06/30/16 0011  Weight: 44.9 kg (99 lb)    Examination:  General exam: Appears calm and comfortable Respiratory system: Clear to auscultation. Respiratory effort normal. Cardiovascular system: S1 & S2 heard, RRR. No murmur Gastrointestinal system: Abdomen is nondistended, soft and nontender. Normal bowel sounds heard. Central nervous system: Alert and oriented. No focal neurological deficits. Extremities: No edema. No calf tenderness Skin: No cyanosis. No rashes Psychiatry: Judgement and insight appear normal. Mood & affect appropriate.     Data Reviewed: I have personally reviewed following labs and imaging studies  CBC:  Recent Labs Lab 06/29/16 2353 06/30/16 0008 06/30/16 0339 07/01/16 0200 07/02/16 1009  WBC 17.5*  --  19.2* 22.0* 18.8*  NEUTROABS 10.5*  --   --   --   --   HGB 11.2* 12.2*  11.1* 9.7* 10.0*  HCT 36.9* 36.0* 36.3* 31.5* 32.6*  MCV 86.2  --  87.9 86.3 86.2  PLT 400  --  394 314 123456   Basic Metabolic Panel:  Recent Labs Lab 06/30/16 0008 06/30/16 0339 07/01/16 0200  NA 141 140 134*  K 4.0 4.5 5.1  CL 95* 95* 91*  CO2  --  36* 35*  GLUCOSE 121* 199* 151*  BUN 7 9 23*  CREATININE 0.80 0.93 0.76  CALCIUM  --  9.2 8.7*   GFR: Estimated Creatinine Clearance: 63.9 mL/min (by C-G formula based on SCr of 0.76 mg/dL). Liver Function  Tests: No results for input(s): AST, ALT, ALKPHOS, BILITOT, PROT, ALBUMIN in the last 168 hours. No results for input(s): LIPASE, AMYLASE in the last 168 hours. No results for input(s): AMMONIA in the last 168 hours. Coagulation Profile: No results for input(s): INR, PROTIME in the last 168 hours. Cardiac Enzymes:  Recent Labs Lab 06/30/16 1538 06/30/16 2009 07/01/16 0200 07/01/16 0824 07/01/16 1355  TROPONINI 0.13* 0.40* 0.84* 1.04* 0.87*   BNP (last 3 results) No results for input(s): PROBNP in the last 8760 hours. HbA1C: No results for input(s): HGBA1C in the last 72 hours. CBG: No results for input(s): GLUCAP in the last 168 hours. Lipid Profile: No results for input(s): CHOL, HDL, LDLCALC, TRIG, CHOLHDL, LDLDIRECT in the last 72 hours. Thyroid Function Tests: No results for input(s): TSH, T4TOTAL, FREET4, T3FREE, THYROIDAB in the last 72 hours. Anemia Panel: No results for input(s): VITAMINB12, FOLATE, FERRITIN, TIBC, IRON, RETICCTPCT in the last 72 hours. Sepsis Labs: No results for input(s): PROCALCITON, LATICACIDVEN in the last 168 hours.  Recent Results (from the past 240 hour(s))  Blood culture (routine x 2)     Status: None (Preliminary result)   Collection Time: 06/30/16  1:00 AM  Result Value Ref Range Status   Specimen Description BLOOD BLOOD LEFT FOREARM  Final   Special Requests BOTTLES DRAWN AEROBIC AND ANAEROBIC 5CC EA  Final   Culture   Final    NO GROWTH 2 DAYS Performed at [facility], [address], [place], [place] [address]    Report Status PENDING  Incomplete  Blood culture (routine x 2)     Status: None (Preliminary result)   Collection Time: 06/30/16  1:04 AM  Result Value Ref Range Status   Specimen Description BLOOD RIGHT ANTECUBITAL  Final   Special Requests BOTTLES DRAWN AEROBIC AND ANAEROBIC 5ML EA  Final   Culture   Final    NO GROWTH 2 DAYS Performed at [facility], [place] [address], [place], [place] [address]     Report Status PENDING  Incomplete  MRSA PCR Screening     Status: None   Collection Time: 06/30/16  2:59 AM  Result Value Ref Range Status   MRSA by PCR NEGATIVE NEGATIVE Final    Comment:        The GeneXpert MRSA Assay (FDA approved for NASAL specimens only), is one component of a comprehensive MRSA colonization surveillance program. It is not intended to diagnose MRSA infection nor to guide or monitor treatment for MRSA infections.   Respiratory Panel by PCR     Status: None   Collection Time: 06/30/16  7:38 AM  Result Value Ref Range Status   Adenovirus NOT DETECTED NOT DETECTED Final   Coronavirus 229E NOT DETECTED NOT DETECTED Final   Coronavirus HKU1 NOT DETECTED NOT DETECTED Final   Coronavirus NL63 NOT DETECTED NOT DETECTED  Final   Coronavirus OC43 NOT DETECTED NOT DETECTED Final   Metapneumovirus NOT DETECTED NOT DETECTED Final   Rhinovirus / Enterovirus NOT DETECTED NOT DETECTED Final   Influenza A NOT DETECTED NOT DETECTED Final   Influenza B NOT DETECTED NOT DETECTED Final   Parainfluenza Virus 1 NOT DETECTED NOT DETECTED Final   Parainfluenza Virus 2 NOT DETECTED NOT DETECTED Final   Parainfluenza Virus 3 NOT DETECTED NOT DETECTED Final   Parainfluenza Virus 4 NOT DETECTED NOT DETECTED Final   Respiratory Syncytial Virus NOT DETECTED NOT DETECTED Final   Bordetella pertussis NOT DETECTED NOT DETECTED Final   Chlamydophila pneumoniae NOT DETECTED NOT DETECTED Final   Mycoplasma pneumoniae NOT DETECTED NOT DETECTED Final    Comment: Performed at [facility], [place] [address], [place], [place] [address]         Radiology Studies: Dg Abd Portable 1v  Result Date: 07/02/2016 CLINICAL DATA:  Abdominal pain and constipation. EXAM: PORTABLE ABDOMEN - 1 VIEW COMPARISON:  None. FINDINGS: Fair amount of stool is seen in the colon. No small bowel dilatation. No unexpected radiopaque calculi. IMPRESSION: Fair amount of stool in the colon is indicative of  constipation. Electronically Signed   By: Lorin Picket M.D.   On: 07/02/2016 11:15        Scheduled Meds: . atorvastatin  40 mg Oral QPM  . budesonide (PULMICORT) nebulizer solution  0.25 mg Nebulization BID  . clopidogrel  75 mg Oral Daily  . enoxaparin (LOVENOX) injection  30 mg Subcutaneous Q24H  . feeding supplement (ENSURE ENLIVE)  237 mL Oral TID BM  . ipratropium-albuterol  3 mL Nebulization TID  . isosorbide mononitrate  15 mg Oral Daily  . levofloxacin  750 mg Oral Daily  . methylPREDNISolone (SOLU-MEDROL) injection  40 mg Intravenous Q12H  . multivitamins with iron  1 tablet Oral Daily  . pantoprazole  40 mg Oral Daily  . polyethylene glycol  17 g Oral BID  . sertraline  100 mg Oral Daily  . tamsulosin  0.4 mg Oral QPC supper   Continuous Infusions:   LOS: 2 days     Cordelia Poche [facility] 07/02/2016, 5:56 PM Pager: [phone]  If 7PM-7AM, please contact night-coverage [email or web address] Password [facility] 07/02/2016, 5:56 PM

## 2016-07-03 DIAGNOSIS — E43 Unspecified severe protein-calorie malnutrition: Secondary | ICD-10-CM

## 2016-07-03 DIAGNOSIS — E785 Hyperlipidemia, unspecified: Secondary | ICD-10-CM

## 2016-07-03 NOTE — Progress Notes (Signed)
NCM contacted [facility] DME Liaison for delivery of oxygen prior to dc.  Pt states his roommate, [phone].  Will be home to receive oxygen. Completed [facility] paperwork. Jonnie Finner RN CCM Case Mgmt phone [phone]

## 2016-07-03 NOTE — Discharge Summary (Addendum)
Physician Discharge Summary  SAVIER KADLEC [id number] DOB: 04-12-1956 DOA: 06/29/2016  PCP: Berkley Harvey, NP  Admit date: 06/29/2016 Discharge date: 07/03/2016  Admitted From: Home Disposition: Home  Recommendations for Outpatient Follow-up:  1. Follow up with PCP in 1 week 2. Follow-up with pulmonology in one week 3. Follow-up with cardiology in one week 4. Please obtain BMP/CBC in one week 5. Please follow up on the following pending results: Blood cultures  Home Health: Physical therapy Equipment/Devices: O2, 4 wheel walker with seat  Discharge Condition: Guarded CODE STATUS: Full code   Brief/Interim Summary:  Admission HPI written by Gean Birchwood, MD   Chief Complaint: Shortness of breath.  HPI: Philip Coleman is a 61 y.o. male with COPD, CAD status post stenting, diastolic CHF, hyperlipidemia presents to the ER because of sudden onset of shortness of breath last night. Patient states he has been having some issues with the oxygen tubing and was not able to use his home oxygen and nebulizer as required. In the ER patient was found to be short of breath wheezing and chest x-ray showing infiltrates.   ED Course: Patient was placed on BiPAP and given nebulizer and IV antibiotics and steroids and admitted for acute respiratory failure secondary to COPD and pneumonia. Patient denies any chest pain and does not have any clear signs of fluid overload. By the time I examined patient states he is feeling better but still mildly short of breath. Also complains of constipation. Denies any abdominal pain or nausea or vomiting.    Hospital course:  Acute respiratory failure with hypoxia Secondary to COPD exacerbation vs pneumonia vs combination. Initially requiring BiPAP and was weaned to nasal cannula. PT evaluated the patient required 6 L of oxygen while ambulating. Influenza and respiratory virus panel negative.  COPD exacerbation COPD Chronic respiratory  failure Exacerbation likely secondary to pneumonia. Patient started on Solu-Medrol. Continue Pulmicort and DuoNeb. Oxygen therapy as needed. Transition to prednisone at discharge.  HCAP Right mid and basilar lung interspaces. Patient started on vancomycin and cefepime for empiric coverage. Blood cultures were obtained which were no growth up until discharge. Patient transitioned to [place].  Elevated troponin No chest pain or EKG changes, however, presented with sudden onset of dyspnea and has a history of CAD with stents. Cardiology was consulted. Management below.  CAD s/p stent Troponin elevated. No chest pain. Echocardiogram unable to assess wall motion. Cardiology consulted and recommended follow-up with cardiology as an outpatient. Continued Imdur, plavix and atorvastatin. Troponin reached peak of 1.04 before trending down.  Constipation Fecal impaction Resolved with enemas and MiraLAX. Patient had persistent constipation, so scheduled Miralax q4 hours which was decreased to twice daily.  Chronic diastolic CHF Echocardiogram in 2015 significant for EF of 60-65% with grade 1 diastolic dysfunction. Euvolemic.   BPH Continued Flomax  Chronic anemia Normocytic. Stable. No evidence of bleeding  Abdominal pain Related to constipation. Abdominal x-ray significant for stool.  Protein-calorie malnutrition, severe Protein supplements.  Discharge Diagnoses:  Principal Problem:   Acute respiratory failure with hypoxia (HCC) Active Problems:   Protein-calorie malnutrition, severe (HCC)   COPD exacerbation (HCC)   CAD in native artery   Hyperlipidemia   HCAP (healthcare-associated pneumonia)    Discharge Instructions   Allergies as of 07/03/2016      Reactions   Codeine Hives, Nausea And Vomiting      Medication List    TAKE these medications   albuterol (2.5 MG/3ML) 0.083% nebulizer solution Commonly known as:  PROVENTIL Take 2.5 mg by nebulization every 6  (six) hours as needed for wheezing or shortness of breath.   albuterol 108 (90 Base) MCG/ACT inhaler Commonly known as:  PROVENTIL HFA;VENTOLIN HFA Inhale 2 puffs into the lungs every 6 (six) hours as needed for wheezing or shortness of breath.   atorvastatin 40 MG tablet Commonly known as:  LIPITOR Take 40 mg by mouth every evening.   budesonide-formoterol 160-4.5 MCG/ACT inhaler Commonly known as:  SYMBICORT Inhale 2 puffs into the lungs 2 (two) times daily.   clonazePAM 0.5 MG tablet Commonly known as:  KLONOPIN Take 0.5 mg by mouth 2 (two) times daily as needed for anxiety.   clopidogrel 75 MG tablet Commonly known as:  PLAVIX Take 75 mg by mouth daily.   COMBIVENT RESPIMAT 20-100 MCG/ACT Aers respimat Generic drug:  Ipratropium-Albuterol Inhale 1 puff into the lungs every 6 (six) hours as needed for wheezing or shortness of breath.   feeding supplement (ENSURE ENLIVE) Liqd Take 237 mLs by mouth 3 (three) times daily between meals.   isosorbide mononitrate 30 MG 24 hr tablet Commonly known as:  IMDUR Take 15 mg by mouth daily.   levofloxacin 750 MG tablet Commonly known as:  LEVAQUIN Take 1 tablet (750 mg total) by mouth daily.   nitroGLYCERIN 0.4 MG SL tablet Commonly known as:  NITROSTAT Place 1 tablet (0.4 mg total) under the tongue every 5 (five) minutes as needed for chest pain.   pantoprazole 40 MG tablet Commonly known as:  PROTONIX Take 1 tablet (40 mg total) by mouth daily.   polyethylene glycol packet Commonly known as:  MIRALAX / GLYCOLAX Take 17 g by mouth 2 (two) times daily.   predniSONE 20 MG tablet Commonly known as:  DELTASONE Take 60mg  x 2 days, 40mg  x 2 days, 20mg  x 3 days   sertraline 100 MG tablet Commonly known as:  ZOLOFT Take 100 mg by mouth daily.   tamsulosin 0.4 MG Caps capsule Commonly known as:  FLOMAX Take 0.4 mg by mouth daily after supper.   traMADol 50 MG tablet Commonly known as:  ULTRAM Take 100 mg by mouth every 12  (twelve) hours as needed for moderate pain.            Durable Medical Equipment        Start     Ordered   07/03/16 1354  For home use only DME oxygen  Once    Comments:  6L while ambulating. 5L at rest.  Question Answer Comment  Liters per Minute 6   Frequency Continuous (stationary and portable oxygen unit needed)   Oxygen delivery system Gas      07/03/16 1354   07/02/16 1602  For home use only DME 4 wheeled rolling walker with seat  Once    Question:  Patient needs a walker to treat with the following condition  Answer:  COPD (chronic obstructive pulmonary disease) ([place])   07/02/16 1601     Follow-up Information    Berkley Harvey, NP. Schedule an appointment as soon as possible for a visit in 1 week(s).   Specialty:  Nurse Practitioner Contact information: [place] [address] [facility].   Why:  Home Health RN, Physical Therapy and Occupational Therapy Contact information: [address] [place] [address] [phone]        Chesley Mires, MD. Schedule an appointment as soon as  possible for a visit in 1 week(s).   Specialty:  Pulmonary Disease Contact information: [address] [place] [address] [phone]          Allergies  Allergen Reactions  . Codeine Hives and Nausea And Vomiting    Consultations:  Cardiology   Procedures/Studies: Dg Chest 2 View  Result Date: 06/17/2016 CLINICAL DATA:  61 y/o M; worsening shortness of breath. History of COPD. EXAM: CHEST  2 VIEW COMPARISON:  06/01/2016 chest radiograph FINDINGS: Stable cardiac silhouette within normal limits. Aortic atherosclerosis with calcification. Emphysema with bullous changes greatest in the left apex and right lung base. Chronic blunting of costal diaphragmatic angles. Left lung base opacity may represent atelectasis or pneumonia. No pneumothorax. IMPRESSION: Left lung base opacity may  represent atelectasis or pneumonia. Aortic atherosclerosis. Stable cardiac silhouette. Severe emphysema. Electronically Signed   By: Kristine Garbe M.D.   On: 06/17/2016 06:13   Ct Head Wo Contrast  Result Date: 06/04/2016 CLINICAL DATA:  Headache EXAM: CT HEAD WITHOUT CONTRAST TECHNIQUE: Contiguous axial images were obtained from the base of the skull through the vertex without intravenous contrast. COMPARISON:  None. FINDINGS: Brain: Patchy chronic microvascular disease throughout the deep white matter. No acute intracranial abnormality. Specifically, no hemorrhage, hydrocephalus, mass lesion, acute infarction, or significant intracranial injury. Vascular: No hyperdense vessel or unexpected calcification. Skull: No acute calvarial abnormality. Sinuses/Orbits: Visualized paranasal sinuses and mastoids clear. Orbital soft tissues unremarkable. Other: None IMPRESSION: Patchy chronic microvascular disease. No acute intracranial abnormality. Electronically Signed   By: Rolm Baptise M.D.   On: 06/04/2016 11:35   Dg Chest Portable 1 View  Result Date: 06/30/2016 CLINICAL DATA:  Acute onset of shortness of breath. Tachycardia. Initial encounter. EXAM: PORTABLE CHEST 1 VIEW COMPARISON:  Chest radiograph performed 06/17/2016 FINDINGS: The lungs are well-aerated. Mild right midlung and right basilar airspace opacities raise concern for pneumonia. Peribronchial thickening is noted. There is no evidence of pleural effusion or pneumothorax. The cardiomediastinal silhouette is within normal limits. No acute osseous abnormalities are seen. Postoperative change is noted overlying the left quadrant. IMPRESSION: Mild right midlung and right basilar airspace opacities raise concern for pneumonia. Peribronchial thickening noted. Electronically Signed   By: Garald Balding M.D.   On: 06/30/2016 00:05   Dg Abd Portable 1v  Result Date: 07/02/2016 CLINICAL DATA:  Abdominal pain and constipation. EXAM: PORTABLE  ABDOMEN - 1 VIEW COMPARISON:  None. FINDINGS: Fair amount of stool is seen in the colon. No small bowel dilatation. No unexpected radiopaque calculi. IMPRESSION: Fair amount of stool in the colon is indicative of constipation. Electronically Signed   By: Lorin Picket M.D.   On: 07/02/2016 11:15     Echocardiogram (07/01/2016)  Study Conclusions  - Left ventricle: Overall LVF is intact but cannot comment on   regional wall motion as endocardial segments are note adequately   visualized. The cavity size was normal. Systolic function was   normal. Wall motion was normal; there were no regional wall   motion abnormalities. - Mitral valve: Calcified annulus. - Atrial septum: There was increased thickness of the septum,   consistent with lipomatous hypertrophy. - Pulmonary arteries: PA peak pressure: 32 mm Hg (S).  Recommendations:  Recommend definity contrast to evaluate for regional wall motion.   Subjective: Patient reports baseline dyspnea. No chest pain. Abdominal pain has improved with bowel movement.  Discharge Exam: Vitals:   07/03/16 0408 07/03/16 1353  BP: 133/78 134/68  Pulse: 95 (!) 106  Resp: 19 18  Temp: 98.6 F (37 C) 98.6 F (37 C)   Vitals:   07/02/16 2043 07/03/16 0408 07/03/16 0816 07/03/16 1353  BP:  133/78  134/68  Pulse:  95  (!) 106  Resp:  19  18  Temp:  98.6 F (37 C)  98.6 F (37 C)  TempSrc:  Oral  Oral  SpO2: 98% 98% 97% 97%  Weight:      Height:        General exam: Appears calm and comfortable. Looks chronically ill Respiratory system: Clear to auscultation. Respiratory effort normal. Cardiovascular system: S1 & S2 heard, RRR. No murmur Gastrointestinal system: Abdomen is nondistended, soft and nontender. Normal bowel sounds heard. Central nervous system: Alert and oriented. No focal neurological deficits. Extremities: No edema. No calf tenderness Skin: No cyanosis. No rashes Psychiatry: Judgement and insight appear normal. Mood &  affect appropriate.    The results of significant diagnostics from this hospitalization (including imaging, microbiology, ancillary and laboratory) are listed below for reference.     Microbiology: Recent Results (from the past 240 hour(s))  Blood culture (routine x 2)     Status: None (Preliminary result)   Collection Time: 06/30/16  1:00 AM  Result Value Ref Range Status   Specimen Description BLOOD BLOOD LEFT FOREARM  Final   Special Requests BOTTLES DRAWN AEROBIC AND ANAEROBIC 5CC EA  Final   Culture   Final    NO GROWTH 2 DAYS Performed at [facility], [address], [place], [place] [address]    Report Status PENDING  Incomplete  Blood culture (routine x 2)     Status: None (Preliminary result)   Collection Time: 06/30/16  1:04 AM  Result Value Ref Range Status   Specimen Description BLOOD RIGHT ANTECUBITAL  Final   Special Requests BOTTLES DRAWN AEROBIC AND ANAEROBIC 5ML EA  Final   Culture   Final    NO GROWTH 2 DAYS Performed at [facility], [place] [address], [place], [place] [address]    Report Status PENDING  Incomplete  MRSA PCR Screening     Status: None   Collection Time: 06/30/16  2:59 AM  Result Value Ref Range Status   MRSA by PCR NEGATIVE NEGATIVE Final    Comment:        The GeneXpert MRSA Assay (FDA approved for NASAL specimens only), is one component of a comprehensive MRSA colonization surveillance program. It is not intended to diagnose MRSA infection nor to guide or monitor treatment for MRSA infections.   Respiratory Panel by PCR     Status: None   Collection Time: 06/30/16  7:38 AM  Result Value Ref Range Status   Adenovirus NOT DETECTED NOT DETECTED Final   Coronavirus 229E NOT DETECTED NOT DETECTED Final   Coronavirus HKU1 NOT DETECTED NOT DETECTED Final   Coronavirus NL63 NOT DETECTED NOT DETECTED Final   Coronavirus OC43 NOT DETECTED NOT DETECTED Final   Metapneumovirus NOT DETECTED NOT DETECTED Final   Rhinovirus /  Enterovirus NOT DETECTED NOT DETECTED Final   Influenza A NOT DETECTED NOT DETECTED Final   Influenza B NOT DETECTED NOT DETECTED Final   Parainfluenza Virus 1 NOT DETECTED NOT DETECTED Final   Parainfluenza Virus 2 NOT DETECTED NOT DETECTED Final   Parainfluenza Virus 3 NOT DETECTED NOT DETECTED Final   Parainfluenza Virus 4 NOT DETECTED NOT DETECTED Final   Respiratory Syncytial Virus NOT DETECTED NOT DETECTED Final   Bordetella pertussis NOT DETECTED NOT DETECTED Final  Chlamydophila pneumoniae NOT DETECTED NOT DETECTED Final   Mycoplasma pneumoniae NOT DETECTED NOT DETECTED Final    Comment: Performed at [facility], [place] [address] [place], [place] [address]     Labs: BNP (last 3 results)  Recent Labs  06/02/16 0023 06/30/16 0339  BNP 189.1* [id number]*   Basic Metabolic Panel:  Recent Labs Lab 06/30/16 0008 06/30/16 0339 07/01/16 0200  NA 141 140 134*  K 4.0 4.5 5.1  CL 95* 95* 91*  CO2  --  36* 35*  GLUCOSE 121* 199* 151*  BUN 7 9 23*  CREATININE 0.80 0.93 0.76  CALCIUM  --  9.2 8.7*   Liver Function Tests: No results for input(s): AST, ALT, ALKPHOS, BILITOT, PROT, ALBUMIN in the last 168 hours. No results for input(s): LIPASE, AMYLASE in the last 168 hours. No results for input(s): AMMONIA in the last 168 hours. CBC:  Recent Labs Lab 06/29/16 2353 06/30/16 0008 06/30/16 0339 07/01/16 0200 07/02/16 1009  WBC 17.5*  --  19.2* 22.0* 18.8*  NEUTROABS 10.5*  --   --   --   --   HGB 11.2* 12.2* 11.1* 9.7* 10.0*  HCT 36.9* 36.0* 36.3* 31.5* 32.6*  MCV 86.2  --  87.9 86.3 86.2  PLT 400  --  394 314 335   Cardiac Enzymes:  Recent Labs Lab 06/30/16 1538 06/30/16 2009 07/01/16 0200 07/01/16 0824 07/01/16 1355  TROPONINI 0.13* 0.40* 0.84* 1.04* 0.87*   BNP: Invalid input(s): POCBNP CBG: No results for input(s): GLUCAP in the last 168 hours. D-Dimer No results for input(s): DDIMER in the last 72 hours. Hgb A1c No results for input(s): HGBA1C  in the last 72 hours. Lipid Profile No results for input(s): CHOL, HDL, LDLCALC, TRIG, CHOLHDL, LDLDIRECT in the last 72 hours. Thyroid function studies No results for input(s): TSH, T4TOTAL, T3FREE, THYROIDAB in the last 72 hours.  Invalid input(s): FREET3 Anemia work up No results for input(s): VITAMINB12, FOLATE, FERRITIN, TIBC, IRON, RETICCTPCT in the last 72 hours. Urinalysis    Component Value Date/Time   COLORURINE YELLOW 01/05/2011 0815   APPEARANCEUR CLEAR 01/05/2011 0815   LABSPEC 1.011 01/05/2011 0815   PHURINE 6.5 01/05/2011 0815   GLUCOSEU NEGATIVE 01/05/2011 0815   HGBUR NEGATIVE 01/05/2011 0815   BILIRUBINUR NEGATIVE 01/05/2011 0815   KETONESUR NEGATIVE 01/05/2011 0815   PROTEINUR NEGATIVE 01/05/2011 0815   UROBILINOGEN 0.2 01/05/2011 0815   NITRITE NEGATIVE 01/05/2011 0815   LEUKOCYTESUR NEGATIVE 01/05/2011 0815   Sepsis Labs Invalid input(s): PROCALCITONIN,  WBC,  LACTICIDVEN Microbiology Recent Results (from the past 240 hour(s))  Blood culture (routine x 2)     Status: None (Preliminary result)   Collection Time: 06/30/16  1:00 AM  Result Value Ref Range Status   Specimen Description BLOOD BLOOD LEFT FOREARM  Final   Special Requests BOTTLES DRAWN AEROBIC AND ANAEROBIC 5CC EA  Final   Culture   Final    NO GROWTH 2 DAYS Performed at [facility], [place] [address], [place], [place] [address]    Report Status PENDING  Incomplete  Blood culture (routine x 2)     Status: None (Preliminary result)   Collection Time: 06/30/16  1:04 AM  Result Value Ref Range Status   Specimen Description BLOOD RIGHT ANTECUBITAL  Final   Special Requests BOTTLES DRAWN AEROBIC AND ANAEROBIC 5ML EA  Final   Culture   Final    NO GROWTH 2 DAYS Performed at [facility], [place]  [address] [place], [place] [address]    Report Status PENDING  Incomplete  MRSA PCR Screening     Status: None   Collection Time: 06/30/16  2:59 AM  Result Value Ref Range Status   MRSA by  PCR NEGATIVE NEGATIVE Final    Comment:        The GeneXpert MRSA Assay (FDA approved for NASAL specimens only), is one component of a comprehensive MRSA colonization surveillance program. It is not intended to diagnose MRSA infection nor to guide or monitor treatment for MRSA infections.   Respiratory Panel by PCR     Status: None   Collection Time: 06/30/16  7:38 AM  Result Value Ref Range Status   Adenovirus NOT DETECTED NOT DETECTED Final   Coronavirus 229E NOT DETECTED NOT DETECTED Final   Coronavirus HKU1 NOT DETECTED NOT DETECTED Final   Coronavirus NL63 NOT DETECTED NOT DETECTED Final   Coronavirus OC43 NOT DETECTED NOT DETECTED Final   Metapneumovirus NOT DETECTED NOT DETECTED Final   Rhinovirus / Enterovirus NOT DETECTED NOT DETECTED Final   Influenza A NOT DETECTED NOT DETECTED Final   Influenza B NOT DETECTED NOT DETECTED Final   Parainfluenza Virus 1 NOT DETECTED NOT DETECTED Final   Parainfluenza Virus 2 NOT DETECTED NOT DETECTED Final   Parainfluenza Virus 3 NOT DETECTED NOT DETECTED Final   Parainfluenza Virus 4 NOT DETECTED NOT DETECTED Final   Respiratory Syncytial Virus NOT DETECTED NOT DETECTED Final   Bordetella pertussis NOT DETECTED NOT DETECTED Final   Chlamydophila pneumoniae NOT DETECTED NOT DETECTED Final   Mycoplasma pneumoniae NOT DETECTED NOT DETECTED Final    Comment: Performed at [facility], [place] [address] [place], [place] [address]     Time coordinating discharge: Over 30 minutes  SIGNED:   Cordelia Poche, MD [facility] 07/03/2016, 1:55 PM Pager [phone]  If 7PM-7AM, please contact night-coverage [email or web address] Password [email or web address]

## 2016-07-03 NOTE — Progress Notes (Signed)
Received call from CM that pt's O2 and tubing had been delivered to home. At this time, I have called [facility] for transportation for pt from hospital to home. Pt aware of discharge with [facility].

## 2016-07-03 NOTE — Progress Notes (Signed)
Pt leaving this afternoon with [facility] headed to his home. [facility] will be delivering his new O2 tubing and O2. Pt alert, oriented, and without c/o. Aware of followup appointments. Discharge instructions/precriptions given/explained with pt verbalizing understanding.

## 2016-07-03 NOTE — Progress Notes (Signed)
Pt leaving with [facility] at this time. O2 at 5 L/min via [place]. Pt leaving with cell phone and charger.

## 2016-07-03 NOTE — Care Management Important Message (Signed)
Important Message  Patient Details  Name: Philip Coleman MRN: [id number] Date of Birth: 07-26-1955   Medicare Important Message Given:  Yes    Erenest Rasher, RN 07/03/2016, 1:53 PM

## 2016-07-05 LAB — CULTURE, BLOOD (ROUTINE X 2)
Culture: NO GROWTH
Culture: NO GROWTH

## 2016-07-06 ENCOUNTER — Telehealth: Payer: Self-pay | Admitting: Interventional Cardiology

## 2016-07-06 ENCOUNTER — Telehealth: Payer: Self-pay | Admitting: Pulmonary Disease

## 2016-07-06 NOTE — Telephone Encounter (Signed)
New message    Pt is calling to talk to Dr. Irish Lack about his heart attack.

## 2016-07-06 NOTE — Telephone Encounter (Signed)
Spoke with the pt  He states "I don't want to talk to you, I want to talk to the Dr" I advised VS is not here today, and again asked what I can help with with  He states "my lungs" in a very rude and loud tone I advised VS is not back in the office again until 07/18/16  He states "have him call me then"  I advised that if there is something wrong he should let me know so I can help  He then asked me to "listen carefully" and proceeded to scream in the phone that "You can not help me and you are not listening to me".  Pt's tone was rude and condescending the whole entire phone call and when he started screaming at me I ended the call at that point.  Will forward to VS to make him aware and see if he wants to call the pt

## 2016-07-06 NOTE — Telephone Encounter (Signed)
**Note De-identified Philip Coleman Obfuscation** LMTCB

## 2016-07-19 ENCOUNTER — Other Ambulatory Visit: Payer: Self-pay | Admitting: *Deleted

## 2016-07-19 MED ORDER — CLOPIDOGREL BISULFATE 75 MG PO TABS: 75.0000 mg | ORAL_TABLET | Freq: Every day | ORAL | 1 refills | Status: AC

## 2016-07-20 ENCOUNTER — Telehealth: Payer: Self-pay | Admitting: Pulmonary Disease

## 2016-07-20 NOTE — Telephone Encounter (Signed)
Called and spoke to pt. Pt states he no longer needs assistance. Nothing further needed at this time.

## 2016-07-20 NOTE — Telephone Encounter (Signed)
Left message on voicemail to call back if he still needs assistance.

## 2016-09-07 ENCOUNTER — Telehealth: Payer: Self-pay | Admitting: Pulmonary Disease

## 2016-09-07 NOTE — Telephone Encounter (Signed)
Error.Stanley A Dalton ° °

## 2016-10-11 ENCOUNTER — Telehealth: Payer: Self-pay | Admitting: Pulmonary Disease

## 2016-10-11 NOTE — Telephone Encounter (Signed)
LM x 1 

## 2016-10-12 NOTE — Telephone Encounter (Signed)
lmomtcb x 2  

## 2016-10-13 NOTE — Telephone Encounter (Signed)
lmomtcb x 3  

## 2016-10-14 NOTE — Telephone Encounter (Signed)
lmomtcb x 4.  Will sign off per protocol.

## 2016-10-24 ENCOUNTER — Ambulatory Visit: Payer: Medicare Other | Admitting: Pulmonary Disease

## 2016-11-06 DEATH — deceased

## 2017-01-26 ENCOUNTER — Ambulatory Visit: Payer: Medicare Other | Admitting: Interventional Cardiology

## 2017-05-21 IMAGING — DX DG ABD PORTABLE 1V
1 series · 1 of 1 positions shown · non-contrast
Comparison: None.

CLINICAL DATA: Abdominal pain and constipation.

EXAM:
PORTABLE ABDOMEN - 1 VIEW

[abdomen kub]
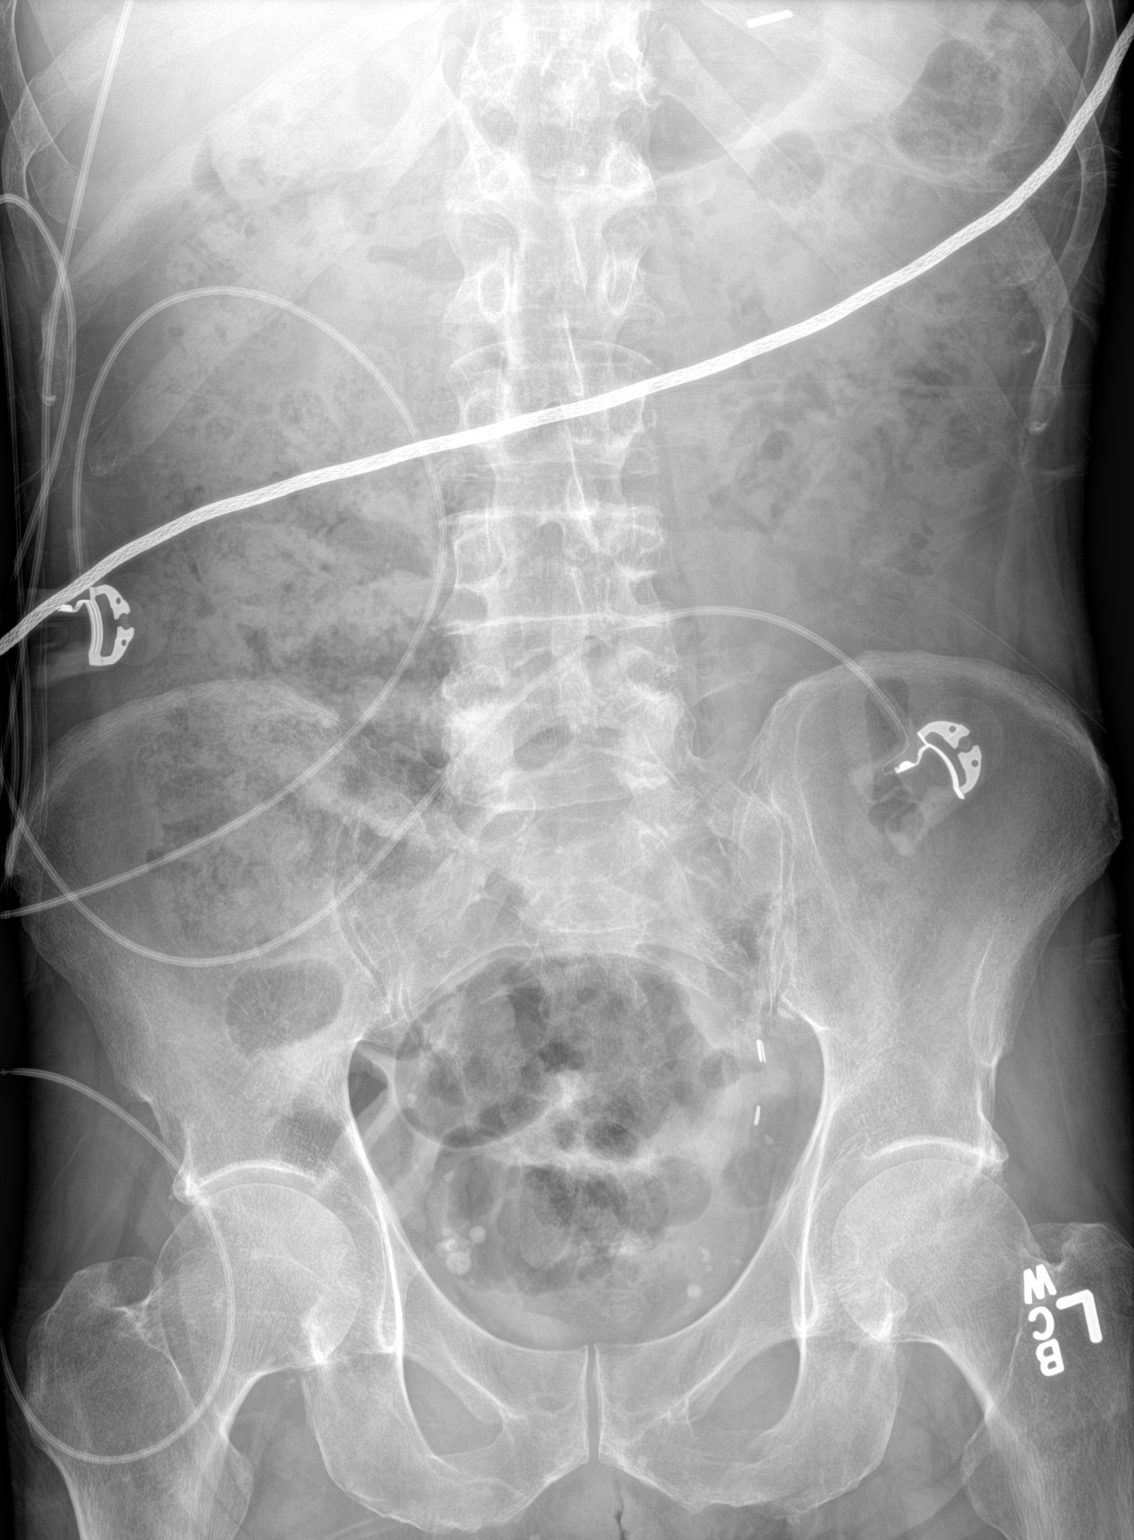

[1 of 1 positions shown; findings below may reference images not displayed]

FINDINGS: Fair amount of stool is seen in the colon. No small bowel
dilatation. No unexpected radiopaque calculi.
IMPRESSION: Fair amount of stool in the colon is indicative of constipation.
